# Patient Record
Sex: Female | Born: 1935
Health system: Southern US, Community
[De-identification: ages and names within clinical notes are randomized; demographics above are authoritative.]

## PROBLEM LIST (undated history)

## (undated) DIAGNOSIS — R0602 Shortness of breath: Secondary | ICD-10-CM

## (undated) DIAGNOSIS — I714 Abdominal aortic aneurysm, without rupture, unspecified: Secondary | ICD-10-CM

## (undated) DIAGNOSIS — J439 Emphysema, unspecified: Secondary | ICD-10-CM

## (undated) DIAGNOSIS — M779 Enthesopathy, unspecified: Secondary | ICD-10-CM

## (undated) DIAGNOSIS — D649 Anemia, unspecified: Secondary | ICD-10-CM

## (undated) DIAGNOSIS — E785 Hyperlipidemia, unspecified: Secondary | ICD-10-CM

## (undated) DIAGNOSIS — C20 Malignant neoplasm of rectum: Secondary | ICD-10-CM

## (undated) DIAGNOSIS — F0150 Vascular dementia without behavioral disturbance: Secondary | ICD-10-CM

## (undated) DIAGNOSIS — K529 Noninfective gastroenteritis and colitis, unspecified: Secondary | ICD-10-CM

## (undated) DIAGNOSIS — E039 Hypothyroidism, unspecified: Secondary | ICD-10-CM

## (undated) DIAGNOSIS — I10 Essential (primary) hypertension: Secondary | ICD-10-CM

## (undated) DIAGNOSIS — M1990 Unspecified osteoarthritis, unspecified site: Secondary | ICD-10-CM

## (undated) DIAGNOSIS — Z9289 Personal history of other medical treatment: Secondary | ICD-10-CM

## (undated) DIAGNOSIS — K435 Parastomal hernia without obstruction or  gangrene: Secondary | ICD-10-CM

## (undated) DIAGNOSIS — M419 Scoliosis, unspecified: Secondary | ICD-10-CM

## (undated) DIAGNOSIS — J449 Chronic obstructive pulmonary disease, unspecified: Secondary | ICD-10-CM

## (undated) DIAGNOSIS — B029 Zoster without complications: Secondary | ICD-10-CM

## (undated) DIAGNOSIS — R51 Headache: Secondary | ICD-10-CM

## (undated) DIAGNOSIS — J40 Bronchitis, not specified as acute or chronic: Secondary | ICD-10-CM

## (undated) DIAGNOSIS — I2510 Atherosclerotic heart disease of native coronary artery without angina pectoris: Secondary | ICD-10-CM

## (undated) DIAGNOSIS — K219 Gastro-esophageal reflux disease without esophagitis: Secondary | ICD-10-CM

## (undated) DIAGNOSIS — E46 Unspecified protein-calorie malnutrition: Secondary | ICD-10-CM

## (undated) DIAGNOSIS — Z87442 Personal history of urinary calculi: Secondary | ICD-10-CM

## (undated) HISTORY — DX: Hypothyroidism, unspecified: E03.9

## (undated) HISTORY — PX: COLOSTOMY: SHX63

## (undated) HISTORY — DX: Chronic obstructive pulmonary disease, unspecified: J44.9

## (undated) HISTORY — PX: VAGINAL HYSTERECTOMY: SUR661

## (undated) HISTORY — DX: Unspecified osteoarthritis, unspecified site: M19.90

## (undated) HISTORY — PX: CARDIAC CATHETERIZATION: SHX172

## (undated) HISTORY — DX: Enthesopathy, unspecified: M77.9

## (undated) HISTORY — DX: Malignant neoplasm of rectum: C20

## (undated) HISTORY — DX: Atherosclerotic heart disease of native coronary artery without angina pectoris: I25.10

## (undated) HISTORY — DX: Emphysema, unspecified: J43.9

## (undated) HISTORY — PX: APPENDECTOMY: SHX54

## (undated) HISTORY — PX: COLON SURGERY: SHX602

## (undated) HISTORY — DX: Scoliosis, unspecified: M41.9

## (undated) HISTORY — PX: OTHER SURGICAL HISTORY: SHX169

## (undated) HISTORY — PX: POLYPECTOMY: SHX149

## (undated) HISTORY — DX: Headache: R51

## (undated) HISTORY — PX: COLONOSCOPY: SHX174

## (undated) HISTORY — DX: Anemia, unspecified: D64.9

## (undated) HISTORY — PX: CHOLECYSTECTOMY: SHX55

## (undated) HISTORY — PX: EYE SURGERY: SHX253

## (undated) HISTORY — DX: Essential (primary) hypertension: I10

## (undated) HISTORY — PX: OOPHORECTOMY: SHX86

## (undated) HISTORY — DX: Bronchitis, not specified as acute or chronic: J40

## (undated) HISTORY — PX: TUBAL LIGATION: SHX77

## (undated) HISTORY — DX: Zoster without complications: B02.9

## (undated) HISTORY — DX: Hyperlipidemia, unspecified: E78.5

---

## 2009-08-16 ENCOUNTER — Encounter: Payer: Self-pay | Admitting: Gastroenterology

## 2009-08-16 ENCOUNTER — Encounter: Payer: Self-pay | Admitting: Internal Medicine

## 2009-08-20 ENCOUNTER — Encounter: Payer: Self-pay | Admitting: Gastroenterology

## 2009-08-25 ENCOUNTER — Encounter: Payer: Self-pay | Admitting: Internal Medicine

## 2009-09-01 ENCOUNTER — Ambulatory Visit: Payer: Self-pay | Admitting: Internal Medicine

## 2009-09-01 DIAGNOSIS — D509 Iron deficiency anemia, unspecified: Secondary | ICD-10-CM

## 2009-09-01 DIAGNOSIS — M810 Age-related osteoporosis without current pathological fracture: Secondary | ICD-10-CM | POA: Insufficient documentation

## 2009-09-01 DIAGNOSIS — I10 Essential (primary) hypertension: Secondary | ICD-10-CM

## 2009-09-01 DIAGNOSIS — C20 Malignant neoplasm of rectum: Secondary | ICD-10-CM

## 2009-09-01 DIAGNOSIS — R51 Headache: Secondary | ICD-10-CM

## 2009-09-01 DIAGNOSIS — E785 Hyperlipidemia, unspecified: Secondary | ICD-10-CM

## 2009-09-01 DIAGNOSIS — J449 Chronic obstructive pulmonary disease, unspecified: Secondary | ICD-10-CM

## 2009-09-01 DIAGNOSIS — R519 Headache, unspecified: Secondary | ICD-10-CM | POA: Insufficient documentation

## 2009-09-01 DIAGNOSIS — J4489 Other specified chronic obstructive pulmonary disease: Secondary | ICD-10-CM | POA: Insufficient documentation

## 2009-09-01 DIAGNOSIS — E039 Hypothyroidism, unspecified: Secondary | ICD-10-CM | POA: Insufficient documentation

## 2009-09-01 LAB — CONVERTED CEMR LAB
Basophils Absolute: 0 10*3/uL (ref 0.0–0.1)
Hemoglobin: 11.5 g/dL — ABNORMAL LOW (ref 12.0–15.0)
Lymphocytes Relative: 12.6 % (ref 12.0–46.0)
Monocytes Relative: 10.8 % (ref 3.0–12.0)
Neutro Abs: 6.5 10*3/uL (ref 1.4–7.7)
RBC: 4.35 M/uL (ref 3.87–5.11)
RDW: 16.9 % — ABNORMAL HIGH (ref 11.5–14.6)
WBC: 9.3 10*3/uL (ref 4.5–10.5)

## 2009-09-02 ENCOUNTER — Ambulatory Visit: Payer: Self-pay | Admitting: Oncology

## 2009-09-04 ENCOUNTER — Telehealth: Payer: Self-pay | Admitting: Family Medicine

## 2009-09-08 ENCOUNTER — Encounter: Payer: Self-pay | Admitting: Internal Medicine

## 2009-09-09 ENCOUNTER — Encounter: Payer: Self-pay | Admitting: Gastroenterology

## 2009-09-09 ENCOUNTER — Telehealth (INDEPENDENT_AMBULATORY_CARE_PROVIDER_SITE_OTHER): Payer: Self-pay | Admitting: *Deleted

## 2009-09-09 ENCOUNTER — Ambulatory Visit: Admission: RE | Admit: 2009-09-09 | Discharge: 2009-11-06 | Payer: Self-pay | Admitting: Radiation Oncology

## 2009-09-09 LAB — CBC WITH DIFFERENTIAL/PLATELET
BASO%: 0.6 % (ref 0.0–2.0)
EOS%: 5.7 % (ref 0.0–7.0)
HCT: 33.3 % — ABNORMAL LOW (ref 34.8–46.6)
LYMPH%: 8.1 % — ABNORMAL LOW (ref 14.0–49.7)
MCH: 26.8 pg (ref 25.1–34.0)
MCHC: 33.3 g/dL (ref 31.5–36.0)
MONO#: 0.9 10*3/uL (ref 0.1–0.9)
NEUT%: 76.2 % (ref 38.4–76.8)
Platelets: 408 10*3/uL — ABNORMAL HIGH (ref 145–400)

## 2009-09-09 LAB — COMPREHENSIVE METABOLIC PANEL
Alkaline Phosphatase: 98 U/L (ref 39–117)
CO2: 25 mEq/L (ref 19–32)
Creatinine, Ser: 0.79 mg/dL (ref 0.40–1.20)
Glucose, Bld: 114 mg/dL — ABNORMAL HIGH (ref 70–99)
Sodium: 138 mEq/L (ref 135–145)
Total Bilirubin: 0.4 mg/dL (ref 0.3–1.2)
Total Protein: 6.1 g/dL (ref 6.0–8.3)

## 2009-09-09 LAB — CEA: CEA: 1.8 ng/mL (ref 0.0–5.0)

## 2009-09-12 ENCOUNTER — Ambulatory Visit: Payer: Self-pay | Admitting: Gastroenterology

## 2009-09-12 ENCOUNTER — Ambulatory Visit (HOSPITAL_COMMUNITY): Admission: RE | Admit: 2009-09-12 | Discharge: 2009-09-12 | Payer: Self-pay | Admitting: Gastroenterology

## 2009-09-16 ENCOUNTER — Ambulatory Visit: Payer: Self-pay | Admitting: Internal Medicine

## 2009-09-16 DIAGNOSIS — R112 Nausea with vomiting, unspecified: Secondary | ICD-10-CM | POA: Insufficient documentation

## 2009-09-16 LAB — CONVERTED CEMR LAB
Albumin: 2.7 g/dL — ABNORMAL LOW (ref 3.5–5.2)
BUN: 8 mg/dL (ref 6–23)
CO2: 30 meq/L (ref 19–32)
Calcium: 9.6 mg/dL (ref 8.4–10.5)
Creatinine, Ser: 0.8 mg/dL (ref 0.4–1.2)
Eosinophils Relative: 2.7 % (ref 0.0–5.0)
Glucose, Bld: 128 mg/dL — ABNORMAL HIGH (ref 70–99)
HCT: 33.8 % — ABNORMAL LOW (ref 36.0–46.0)
Hemoglobin: 11.1 g/dL — ABNORMAL LOW (ref 12.0–15.0)
Lymphs Abs: 1.1 10*3/uL (ref 0.7–4.0)
Monocytes Relative: 9.4 % (ref 3.0–12.0)
Neutro Abs: 8.7 10*3/uL — ABNORMAL HIGH (ref 1.4–7.7)
RDW: 18.2 % — ABNORMAL HIGH (ref 11.5–14.6)
Total Protein: 6 g/dL (ref 6.0–8.3)
WBC: 11.2 10*3/uL — ABNORMAL HIGH (ref 4.5–10.5)

## 2009-09-21 ENCOUNTER — Telehealth (INDEPENDENT_AMBULATORY_CARE_PROVIDER_SITE_OTHER): Payer: Self-pay | Admitting: *Deleted

## 2009-09-23 ENCOUNTER — Ambulatory Visit: Payer: Self-pay | Admitting: Cardiovascular Disease

## 2009-09-23 DIAGNOSIS — R002 Palpitations: Secondary | ICD-10-CM | POA: Insufficient documentation

## 2009-09-28 ENCOUNTER — Encounter (INDEPENDENT_AMBULATORY_CARE_PROVIDER_SITE_OTHER): Payer: Self-pay | Admitting: *Deleted

## 2009-09-28 LAB — CBC WITH DIFFERENTIAL/PLATELET
BASO%: 0.1 % (ref 0.0–2.0)
EOS%: 0.8 % (ref 0.0–7.0)
HCT: 29.6 % — ABNORMAL LOW (ref 34.8–46.6)
LYMPH%: 4.2 % — ABNORMAL LOW (ref 14.0–49.7)
MCH: 27.2 pg (ref 25.1–34.0)
MCHC: 33.8 g/dL (ref 31.5–36.0)
NEUT%: 88.3 % — ABNORMAL HIGH (ref 38.4–76.8)
Platelets: 457 10*3/uL — ABNORMAL HIGH (ref 145–400)
lymph#: 0.4 10*3/uL — ABNORMAL LOW (ref 0.9–3.3)

## 2009-09-28 LAB — COMPREHENSIVE METABOLIC PANEL
ALT: 15 U/L (ref 0–35)
AST: 12 U/L (ref 0–37)
Alkaline Phosphatase: 93 U/L (ref 39–117)
Creatinine, Ser: 0.77 mg/dL (ref 0.40–1.20)
Total Bilirubin: 0.4 mg/dL (ref 0.3–1.2)

## 2009-10-06 ENCOUNTER — Ambulatory Visit: Payer: Self-pay | Admitting: Surgery

## 2009-10-06 ENCOUNTER — Ambulatory Visit: Admission: RE | Admit: 2009-10-06 | Discharge: 2009-10-06 | Payer: Self-pay | Admitting: Radiation Oncology

## 2009-10-06 ENCOUNTER — Encounter: Payer: Self-pay | Admitting: Radiation Oncology

## 2009-10-19 ENCOUNTER — Ambulatory Visit: Payer: Self-pay | Admitting: Oncology

## 2009-10-21 ENCOUNTER — Encounter: Payer: Self-pay | Admitting: Internal Medicine

## 2009-10-21 LAB — CBC WITH DIFFERENTIAL/PLATELET
BASO%: 0.3 % (ref 0.0–2.0)
LYMPH%: 4.7 % — ABNORMAL LOW (ref 14.0–49.7)
MCHC: 33.8 g/dL (ref 31.5–36.0)
MONO#: 0.5 10*3/uL (ref 0.1–0.9)
Platelets: 261 10*3/uL (ref 145–400)
RBC: 3.18 10*6/uL — ABNORMAL LOW (ref 3.70–5.45)
WBC: 3.9 10*3/uL (ref 3.9–10.3)
lymph#: 0.2 10*3/uL — ABNORMAL LOW (ref 0.9–3.3)

## 2009-10-21 LAB — COMPREHENSIVE METABOLIC PANEL
ALT: 10 U/L (ref 0–35)
AST: 10 U/L (ref 0–37)
Alkaline Phosphatase: 69 U/L (ref 39–117)
CO2: 25 mEq/L (ref 19–32)
Sodium: 136 mEq/L (ref 135–145)
Total Bilirubin: 0.5 mg/dL (ref 0.3–1.2)
Total Protein: 5.4 g/dL — ABNORMAL LOW (ref 6.0–8.3)

## 2009-10-21 LAB — CEA: CEA: 1.8 ng/mL (ref 0.0–5.0)

## 2009-11-03 ENCOUNTER — Encounter (HOSPITAL_COMMUNITY): Admission: RE | Admit: 2009-11-03 | Discharge: 2009-12-16 | Payer: Self-pay | Admitting: Oncology

## 2009-11-03 ENCOUNTER — Encounter (INDEPENDENT_AMBULATORY_CARE_PROVIDER_SITE_OTHER): Payer: Self-pay | Admitting: *Deleted

## 2009-11-03 LAB — CBC WITH DIFFERENTIAL/PLATELET
BASO%: 0.3 % (ref 0.0–2.0)
EOS%: 5.6 % (ref 0.0–7.0)
HCT: 26.2 % — ABNORMAL LOW (ref 34.8–46.6)
LYMPH%: 7.6 % — ABNORMAL LOW (ref 14.0–49.7)
MCH: 29.9 pg (ref 25.1–34.0)
MCHC: 33.3 g/dL (ref 31.5–36.0)
MCV: 89.8 fL (ref 79.5–101.0)
MONO%: 15.3 % — ABNORMAL HIGH (ref 0.0–14.0)
NEUT%: 71.2 % (ref 38.4–76.8)
Platelets: 272 10*3/uL (ref 145–400)

## 2009-11-03 LAB — COMPREHENSIVE METABOLIC PANEL
ALT: 11 U/L (ref 0–35)
AST: 13 U/L (ref 0–37)
BUN: 9 mg/dL (ref 6–23)
Creatinine, Ser: 0.88 mg/dL (ref 0.40–1.20)
Total Bilirubin: 0.7 mg/dL (ref 0.3–1.2)

## 2009-11-03 LAB — IRON AND TIBC
Iron: 47 ug/dL (ref 42–145)
UIBC: 252 ug/dL

## 2009-11-04 LAB — TYPE & CROSSMATCH - CHCC

## 2009-11-17 ENCOUNTER — Encounter: Payer: Self-pay | Admitting: Gastroenterology

## 2009-11-17 ENCOUNTER — Ambulatory Visit: Payer: Self-pay | Admitting: Oncology

## 2009-11-17 ENCOUNTER — Encounter (INDEPENDENT_AMBULATORY_CARE_PROVIDER_SITE_OTHER): Payer: Self-pay | Admitting: *Deleted

## 2009-11-21 ENCOUNTER — Encounter: Payer: Self-pay | Admitting: Internal Medicine

## 2009-11-21 LAB — IRON AND TIBC
%SAT: 28 % (ref 20–55)
TIBC: 305 ug/dL (ref 250–470)

## 2009-11-21 LAB — COMPREHENSIVE METABOLIC PANEL
ALT: 8 U/L (ref 0–35)
AST: 10 U/L (ref 0–37)
CO2: 26 mEq/L (ref 19–32)
Calcium: 9.7 mg/dL (ref 8.4–10.5)
Chloride: 104 mEq/L (ref 96–112)
Creatinine, Ser: 0.71 mg/dL (ref 0.40–1.20)
Sodium: 140 mEq/L (ref 135–145)
Total Protein: 6.1 g/dL (ref 6.0–8.3)

## 2009-11-21 LAB — CBC WITH DIFFERENTIAL/PLATELET
BASO%: 0.6 % (ref 0.0–2.0)
EOS%: 2.9 % (ref 0.0–7.0)
HCT: 34.5 % — ABNORMAL LOW (ref 34.8–46.6)
MCH: 31.1 pg (ref 25.1–34.0)
MCHC: 34.2 g/dL (ref 31.5–36.0)
MONO#: 0.5 10*3/uL (ref 0.1–0.9)
NEUT%: 71 % (ref 38.4–76.8)
RBC: 3.79 10*6/uL (ref 3.70–5.45)
RDW: 21.8 % — ABNORMAL HIGH (ref 11.2–14.5)
WBC: 4.1 10*3/uL (ref 3.9–10.3)
lymph#: 0.6 10*3/uL — ABNORMAL LOW (ref 0.9–3.3)

## 2009-12-06 LAB — URINALYSIS, MICROSCOPIC - CHCC
Bilirubin (Urine): NEGATIVE
Ketones: NEGATIVE mg/dL
Specific Gravity, Urine: 1.03 (ref 1.003–1.035)
pH: 6 (ref 4.6–8.0)

## 2009-12-12 ENCOUNTER — Telehealth: Payer: Self-pay | Admitting: Internal Medicine

## 2009-12-21 ENCOUNTER — Ambulatory Visit: Payer: Self-pay | Admitting: Internal Medicine

## 2009-12-22 ENCOUNTER — Telehealth (INDEPENDENT_AMBULATORY_CARE_PROVIDER_SITE_OTHER): Payer: Self-pay | Admitting: *Deleted

## 2009-12-26 ENCOUNTER — Inpatient Hospital Stay (HOSPITAL_COMMUNITY): Admission: RE | Admit: 2009-12-26 | Discharge: 2010-01-09 | Payer: Self-pay | Admitting: Surgery

## 2009-12-27 ENCOUNTER — Encounter (INDEPENDENT_AMBULATORY_CARE_PROVIDER_SITE_OTHER): Payer: Self-pay | Admitting: Surgery

## 2010-01-09 ENCOUNTER — Ambulatory Visit: Payer: Self-pay | Admitting: Oncology

## 2010-01-12 ENCOUNTER — Encounter: Payer: Self-pay | Admitting: Internal Medicine

## 2010-01-19 ENCOUNTER — Encounter: Payer: Self-pay | Admitting: Gastroenterology

## 2010-01-19 ENCOUNTER — Encounter: Admission: RE | Admit: 2010-01-19 | Discharge: 2010-01-19 | Payer: Self-pay | Admitting: Surgery

## 2010-01-26 ENCOUNTER — Encounter: Payer: Self-pay | Admitting: Internal Medicine

## 2010-01-26 LAB — CBC WITH DIFFERENTIAL/PLATELET
BASO%: 0.3 % (ref 0.0–2.0)
Basophils Absolute: 0 10*3/uL (ref 0.0–0.1)
EOS%: 1.5 % (ref 0.0–7.0)
HCT: 33.8 % — ABNORMAL LOW (ref 34.8–46.6)
HGB: 11.5 g/dL — ABNORMAL LOW (ref 11.6–15.9)
LYMPH%: 7.9 % — ABNORMAL LOW (ref 14.0–49.7)
MCH: 30.7 pg (ref 25.1–34.0)
MCHC: 34.1 g/dL (ref 31.5–36.0)
MCV: 90.2 fL (ref 79.5–101.0)
NEUT%: 80.1 % — ABNORMAL HIGH (ref 38.4–76.8)
Platelets: 304 10*3/uL (ref 145–400)

## 2010-01-26 LAB — COMPREHENSIVE METABOLIC PANEL
ALT: <8 U/L (ref 0–35)
AST: 9 U/L (ref 0–37)
BUN: 18 mg/dL (ref 6–23)
Calcium: 10.4 mg/dL (ref 8.4–10.5)
Creatinine, Ser: 0.8 mg/dL (ref 0.40–1.20)
Total Bilirubin: 0.3 mg/dL (ref 0.3–1.2)

## 2010-01-26 LAB — TSH: TSH: 4.945 u[IU]/mL — ABNORMAL HIGH (ref 0.350–4.500)

## 2010-02-06 ENCOUNTER — Telehealth (INDEPENDENT_AMBULATORY_CARE_PROVIDER_SITE_OTHER): Payer: Self-pay | Admitting: *Deleted

## 2010-02-08 ENCOUNTER — Ambulatory Visit (HOSPITAL_BASED_OUTPATIENT_CLINIC_OR_DEPARTMENT_OTHER): Admission: RE | Admit: 2010-02-08 | Discharge: 2010-02-08 | Payer: Self-pay | Admitting: Surgery

## 2010-02-17 ENCOUNTER — Ambulatory Visit: Payer: Self-pay | Admitting: Oncology

## 2010-02-21 ENCOUNTER — Encounter: Payer: Self-pay | Admitting: Internal Medicine

## 2010-02-21 ENCOUNTER — Telehealth: Payer: Self-pay | Admitting: Internal Medicine

## 2010-02-21 LAB — COMPREHENSIVE METABOLIC PANEL
ALT: 13 U/L (ref 0–35)
AST: 15 U/L (ref 0–37)
Albumin: 3.7 g/dL (ref 3.5–5.2)
CO2: 26 mEq/L (ref 19–32)
Calcium: 9.8 mg/dL (ref 8.4–10.5)
Chloride: 106 mEq/L (ref 96–112)
Potassium: 3.8 mEq/L (ref 3.5–5.3)
Sodium: 135 mEq/L (ref 135–145)
Total Protein: 6.7 g/dL (ref 6.0–8.3)

## 2010-02-21 LAB — CBC WITH DIFFERENTIAL/PLATELET
BASO%: 0.3 % (ref 0.0–2.0)
Eosinophils Absolute: 0.1 10*3/uL (ref 0.0–0.5)
HCT: 36.5 % (ref 34.8–46.6)
LYMPH%: 10.6 % — ABNORMAL LOW (ref 14.0–49.7)
MCHC: 32.1 g/dL (ref 31.5–36.0)
MONO#: 0.7 10*3/uL (ref 0.1–0.9)
NEUT#: 4.7 10*3/uL (ref 1.5–6.5)
Platelets: 216 10*3/uL (ref 145–400)
RBC: 4.05 10*6/uL (ref 3.70–5.45)
WBC: 6.1 10*3/uL (ref 3.9–10.3)
lymph#: 0.7 10*3/uL — ABNORMAL LOW (ref 0.9–3.3)
nRBC: 0 % (ref 0–0)

## 2010-03-07 ENCOUNTER — Encounter: Payer: Self-pay | Admitting: Internal Medicine

## 2010-03-07 LAB — CBC WITH DIFFERENTIAL/PLATELET
HGB: 11.6 g/dL (ref 11.6–15.9)
LYMPH%: 9.2 % — ABNORMAL LOW (ref 14.0–49.7)
MCH: 29 pg (ref 25.1–34.0)
MCHC: 32.2 g/dL (ref 31.5–36.0)
MONO#: 1 10*3/uL — ABNORMAL HIGH (ref 0.1–0.9)
MONO%: 17.5 % — ABNORMAL HIGH (ref 0.0–14.0)
NEUT%: 69.8 % (ref 38.4–76.8)
RDW: 15.4 % — ABNORMAL HIGH (ref 11.2–14.5)
WBC: 5.4 10*3/uL (ref 3.9–10.3)
lymph#: 0.5 10*3/uL — ABNORMAL LOW (ref 0.9–3.3)
nRBC: 0 % (ref 0–0)

## 2010-03-07 LAB — COMPREHENSIVE METABOLIC PANEL
ALT: 12 U/L (ref 0–35)
AST: 15 U/L (ref 0–37)
Alkaline Phosphatase: 87 U/L (ref 39–117)
CO2: 25 mEq/L (ref 19–32)
Creatinine, Ser: 0.89 mg/dL (ref 0.40–1.20)
Sodium: 138 mEq/L (ref 135–145)
Total Bilirubin: 0.4 mg/dL (ref 0.3–1.2)
Total Protein: 5.7 g/dL — ABNORMAL LOW (ref 6.0–8.3)

## 2010-03-22 ENCOUNTER — Ambulatory Visit: Payer: Self-pay | Admitting: Family Medicine

## 2010-03-22 LAB — CONVERTED CEMR LAB
ALT: 17 units/L (ref 0–35)
AST: 24 units/L (ref 0–37)
Albumin: 3.4 g/dL — ABNORMAL LOW (ref 3.5–5.2)
Amylase: 37 units/L (ref 27–131)
Chloride: 103 meq/L (ref 96–112)
HCT: 35.4 % — ABNORMAL LOW (ref 36.0–46.0)
MCV: 88.5 fL (ref 78.0–100.0)
Potassium: 4.6 meq/L (ref 3.5–5.1)
RBC: 3.99 M/uL (ref 3.87–5.11)
Total Bilirubin: 0.8 mg/dL (ref 0.3–1.2)
WBC: 2.5 10*3/uL — ABNORMAL LOW (ref 4.5–10.5)

## 2010-03-23 ENCOUNTER — Inpatient Hospital Stay (HOSPITAL_COMMUNITY): Admission: EM | Admit: 2010-03-23 | Discharge: 2010-03-26 | Payer: Self-pay | Admitting: Emergency Medicine

## 2010-03-31 ENCOUNTER — Ambulatory Visit: Payer: Self-pay | Admitting: Oncology

## 2010-04-01 ENCOUNTER — Inpatient Hospital Stay (HOSPITAL_COMMUNITY): Admission: EM | Admit: 2010-04-01 | Discharge: 2010-04-12 | Payer: Self-pay | Admitting: Emergency Medicine

## 2010-04-21 ENCOUNTER — Telehealth: Payer: Self-pay

## 2010-04-26 ENCOUNTER — Encounter: Payer: Self-pay | Admitting: Internal Medicine

## 2010-05-23 ENCOUNTER — Ambulatory Visit: Payer: Self-pay | Admitting: Oncology

## 2010-05-25 ENCOUNTER — Encounter: Payer: Self-pay | Admitting: Internal Medicine

## 2010-05-25 LAB — CBC WITH DIFFERENTIAL/PLATELET
Basophils Absolute: 0 10*3/uL (ref 0.0–0.1)
Eosinophils Absolute: 0.1 10*3/uL (ref 0.0–0.5)
HCT: 31.2 % — ABNORMAL LOW (ref 34.8–46.6)
HGB: 10.7 g/dL — ABNORMAL LOW (ref 11.6–15.9)
LYMPH%: 10.1 % — ABNORMAL LOW (ref 14.0–49.7)
MCV: 86.9 fL (ref 79.5–101.0)
MONO#: 0.5 10*3/uL (ref 0.1–0.9)
MONO%: 12 % (ref 0.0–14.0)
NEUT#: 3.3 10*3/uL (ref 1.5–6.5)
Platelets: 238 10*3/uL (ref 145–400)
WBC: 4.4 10*3/uL (ref 3.9–10.3)

## 2010-05-25 LAB — COMPREHENSIVE METABOLIC PANEL
Albumin: 3.6 g/dL (ref 3.5–5.2)
Alkaline Phosphatase: 103 U/L (ref 39–117)
BUN: 17 mg/dL (ref 6–23)
CO2: 28 mEq/L (ref 19–32)
Glucose, Bld: 118 mg/dL — ABNORMAL HIGH (ref 70–99)
Total Bilirubin: 0.5 mg/dL (ref 0.3–1.2)
Total Protein: 6.3 g/dL (ref 6.0–8.3)

## 2010-05-25 LAB — CEA: CEA: 1.5 ng/mL (ref 0.0–5.0)

## 2010-06-07 ENCOUNTER — Ambulatory Visit (HOSPITAL_BASED_OUTPATIENT_CLINIC_OR_DEPARTMENT_OTHER): Admission: RE | Admit: 2010-06-07 | Discharge: 2010-06-07 | Payer: Self-pay | Admitting: Surgery

## 2010-06-13 ENCOUNTER — Telehealth: Payer: Self-pay | Admitting: Internal Medicine

## 2010-08-15 ENCOUNTER — Ambulatory Visit: Payer: Self-pay | Admitting: Internal Medicine

## 2010-08-15 DIAGNOSIS — R209 Unspecified disturbances of skin sensation: Secondary | ICD-10-CM

## 2010-08-15 LAB — CONVERTED CEMR LAB
AST: 14 units/L (ref 0–37)
TSH: 1.3 microintl units/mL (ref 0.35–5.50)
Total CHOL/HDL Ratio: 4
VLDL: 31.8 mg/dL (ref 0.0–40.0)

## 2010-08-28 ENCOUNTER — Ambulatory Visit: Payer: Self-pay | Admitting: Oncology

## 2010-08-30 ENCOUNTER — Encounter: Payer: Self-pay | Admitting: Internal Medicine

## 2010-09-28 ENCOUNTER — Ambulatory Visit: Payer: Self-pay | Admitting: Internal Medicine

## 2010-09-28 DIAGNOSIS — J069 Acute upper respiratory infection, unspecified: Secondary | ICD-10-CM

## 2010-11-14 ENCOUNTER — Ambulatory Visit: Payer: Self-pay | Admitting: Internal Medicine

## 2010-11-23 ENCOUNTER — Ambulatory Visit: Payer: Self-pay | Admitting: Oncology

## 2010-11-27 ENCOUNTER — Ambulatory Visit (HOSPITAL_COMMUNITY)
Admission: RE | Admit: 2010-11-27 | Discharge: 2010-11-27 | Payer: Self-pay | Source: Home / Self Care | Attending: Oncology | Admitting: Oncology

## 2010-11-27 LAB — COMPREHENSIVE METABOLIC PANEL
AST: 15 U/L (ref 0–37)
Albumin: 3.9 g/dL (ref 3.5–5.2)
BUN: 13 mg/dL (ref 6–23)
Calcium: 10.3 mg/dL (ref 8.4–10.5)
Chloride: 105 mEq/L (ref 96–112)
Creatinine, Ser: 0.76 mg/dL (ref 0.40–1.20)
Glucose, Bld: 87 mg/dL (ref 70–99)
Potassium: 4.6 mEq/L (ref 3.5–5.3)

## 2010-11-27 LAB — CBC WITH DIFFERENTIAL/PLATELET
Basophils Absolute: 0 10*3/uL (ref 0.0–0.1)
EOS%: 3 % (ref 0.0–7.0)
Eosinophils Absolute: 0.1 10*3/uL (ref 0.0–0.5)
HCT: 35.6 % (ref 34.8–46.6)
HGB: 12.1 g/dL (ref 11.6–15.9)
MCH: 29.5 pg (ref 25.1–34.0)
MCV: 86.7 fL (ref 79.5–101.0)
NEUT#: 2.9 10*3/uL (ref 1.5–6.5)
NEUT%: 73.4 % (ref 38.4–76.8)
RDW: 15.3 % — ABNORMAL HIGH (ref 11.2–14.5)
lymph#: 0.5 10*3/uL — ABNORMAL LOW (ref 0.9–3.3)

## 2010-11-27 LAB — CEA: CEA: 1.5 ng/mL (ref 0.0–5.0)

## 2010-11-29 ENCOUNTER — Encounter: Payer: Self-pay | Admitting: Internal Medicine

## 2011-01-16 NOTE — Medication Information (Signed)
Summary: Coverage Approval for Ventolin [facility]  Coverage Approval for Ventolin HFA   Imported By: Maryln Gottron 05/03/2010 09:43:30  _____________________________________________________________________  External Attachment:    Type:   Image     Comment:   External Document

## 2011-01-16 NOTE — Letter (Signed)
[facility]   Imported By: Maryln Gottron 03/16/2010 15:01:58  _____________________________________________________________________  External Attachment:    Type:   Image     Comment:   External Document

## 2011-01-16 NOTE — Letter (Signed)
[facility]   Imported By: Maryln Gottron 06/16/2010 13:37:44  _____________________________________________________________________  External Attachment:    Type:   Image     Comment:   External Document

## 2011-01-16 NOTE — Progress Notes (Signed)
Summary: thyroid  Phone Note Call from Patient   Caller: Daughter Call For: Sherry Savers  MD Summary of Call: TSH:  4.945 Labs were drawn at  Dr. Alver Fisher office one month ago. Daughter:  [phone]  Ms. Friddle Any change of meds.  She is taking Synthroid . daily. Initial call taken by: Lynann Beaver CMA,  February 21, 2010 1:58 PM  Follow-up for Phone Call        nl results- no change meds Follow-up by: Sherry Savers  MD,  February 21, 2010 4:55 PM  Additional Follow-up for Phone Call Additional follow up Details #1::        Pt. notified. Additional Follow-up by: Lynann Beaver CMA,  February 21, 2010 5:03 PM

## 2011-01-16 NOTE — Letter (Signed)
[facility]   Imported By: Maryln Gottron 03/09/2010 15:19:31  _____________________________________________________________________  External Attachment:    Type:   Image     Comment:   External Document

## 2011-01-16 NOTE — Consult Note (Signed)
[facility]   Imported By: Lester  09/30/2009 12:26:55  _____________________________________________________________________  External Attachment:    Type:   Image     Comment:   External Document

## 2011-01-16 NOTE — Letter (Signed)
Summary: [facility]  [facility]   Imported By: Maryln Gottron 12/26/2009 13:05:54  _____________________________________________________________________  External Attachment:    Type:   Image     Comment:   External Document

## 2011-01-16 NOTE — Assessment & Plan Note (Signed)
Summary: 3 MONTH ROV/NJR   Vital Signs:  Patient profile:   77 year old female Weight:      137 pounds Temp:     98.0 degrees F oral BP sitting:   122 / 70  (right arm) Cuff size:   regular  Vitals Entered By: Duard Brady LPN (November 14, 2010 10:17 AM) CC: 3 mos rov - doing well Is Patient Diabetic? No   Primary Care Provider:  Dr.Peter Staci Righter  CC:  3 mos rov - doing well.  History of Present Illness: 77 year old patient who is seen today for follow-up.  This is followed closely by oncology.  She has treated hypertension, and dyslipidemia, as well as a history of COPD.  No concerns or complaints today.  She is scheduled for follow-up in the next month with abdominal CT scan and a colonoscopy in January  Allergies: 1)  ! Bactrim (Sulfamethoxazole-Trimethoprim) 2)  ! Fluvirin (Influenza Vac Typ A&b Surf Ant)  Past History:  Past Medical History: Reviewed history from 03/22/2010 and no changes required. coronary artery disease Hyperlipidemia Hypertension rectal adenocarcinoma, sees Dr. Eli Hose for Oncology and Dr. Wendall Papa for GI care Anemia-iron deficiency COPD Headache Hypothyroidism Osteoporosis history of shingles  Past Surgical History: Reviewed history from 08/15/2010 and no changes required. Appendectomy Cholecystectomy Hysterectomy colonoscopy on August 16, 2009 revealed a rectal mass extending from the dentate line up to 5 cm status post cardiac angiogram, 1997, and 2002  Cardiolite  stress test February 2007-ejection fraction 68% with rest and 73% with stress.  normal test   cath 2002:  EF 70%, 20% prox LAD, mild  disease  removal rectum and 8 " colon Jan 2011 via APR per Dr. Manus Rudd  Review of Systems  The patient denies anorexia, fever, weight loss, weight gain, vision loss, decreased hearing, hoarseness, chest pain, syncope, dyspnea on exertion, peripheral edema, prolonged cough, headaches, hemoptysis, abdominal pain,  melena, hematochezia, severe indigestion/heartburn, hematuria, incontinence, genital sores, muscle weakness, suspicious skin lesions, transient blindness, difficulty walking, depression, unusual weight change, abnormal bleeding, enlarged lymph nodes, angioedema, and breast masses.    Physical Exam  General:  Well-developed,well-nourished,in no acute distress; alert,appropriate and cooperative throughout examination Head:  Normocephalic and atraumatic without obvious abnormalities. No apparent alopecia or balding. Eyes:  No corneal or conjunctival inflammation noted. EOMI. Perrla. Funduscopic exam benign, without hemorrhages, exudates or papilledema. Vision grossly normal. Mouth:  Oral mucosa and oropharynx without lesions or exudates.  Teeth in good repair. Neck:  No deformities, masses, or tenderness noted. Lungs:  Normal respiratory effort, chest expands symmetrically. Lungs are clear to auscultation, no crackles or wheezes. Heart:  Normal rate and regular rhythm. S1 and S2 normal without gallop, murmur, click, rub or other extra sounds. Abdomen:  Bowel sounds positive,abdomen soft and non-tender without masses, organomegaly or hernias noted. Msk:  No deformity or scoliosis noted of thoracic or lumbar spine.     Impression & Recommendations:  Problem # 1:  HYPOTHYROIDISM (ICD-244.9)  Her updated medication list for this problem includes:    Synthroid 75 Mcg Tabs (Levothyroxine sodium) .Marland Kitchen... 1 once daily  Her updated medication list for this problem includes:    Synthroid 75 Mcg Tabs (Levothyroxine sodium) .Marland Kitchen... 1 once daily  Problem # 2:  HYPERTENSION (ICD-401.9)  Her updated medication list for this problem includes:    Diltiazem Hcl Er Beads 180 Mg Xr24h-cap (Diltiazem hcl er beads) .Marland Kitchen... 1 once daily    Toprol Xl 25 Mg Xr24h-tab (Metoprolol succinate) .Marland Kitchen... 1/2  once daily  Her updated medication list for this problem includes:    Diltiazem Hcl Er Beads 180 Mg Xr24h-cap (Diltiazem  hcl er beads) .Marland Kitchen... 1 once daily    Toprol Xl 25 Mg Xr24h-tab (Metoprolol succinate) .Marland Kitchen... 1/2 once daily  Complete Medication List: 1)  Diltiazem Hcl Er Beads 180 Mg Xr24h-cap (Diltiazem hcl er beads) .Marland Kitchen.. 1 once daily 2)  Lipitor 10 Mg Tabs (Atorvastatin calcium) .Marland Kitchen.. 1 once daily 3)  Toprol Xl 25 Mg Xr24h-tab (Metoprolol succinate) .... 1/2 once daily 4)  Synthroid 75 Mcg Tabs (Levothyroxine sodium) .Marland Kitchen.. 1 once daily 5)  Ventolin Hfa 108 (90 Base) Mcg/act Aers (Albuterol sulfate) .... Use qid as needed 6)  Ultracet 37.5-325 Mg Tabs (Tramadol-acetaminophen) .... As needed 7)  Cyclobenzaprine Hcl 5 Mg Tabs (Cyclobenzaprine hcl) .... One every  8 hours for back pain  Patient Instructions: 1)  Please schedule a follow-up appointment in 6 months. 2)  Limit your Sodium (Salt). 3)  It is important that you exercise regularly at least 20 minutes 5 times a week. If you develop chest pain, have severe difficulty breathing, or feel very tired , stop exercising immediately and seek medical attention. Prescriptions: CYCLOBENZAPRINE HCL 5 MG TABS (CYCLOBENZAPRINE HCL) one every  8 hours for back pain  #30 x 2   Entered and Authorized by:   Gordy Savers  MD   Signed by:   Gordy Savers  MD on 11/14/2010   Method used:   Electronically to        [facility]  [address] (retail)       [address]       [place], [place]  [address]       Ph: [phone]       Fax: [phone]   RxID:   [id number]  a and a  Orders Added: 1)  Est. Patient Level III [38101]

## 2011-01-16 NOTE — Letter (Signed)
Summary: [facility]   Imported By: Lester Meadow 02/08/2010 09:49:33  _____________________________________________________________________  External Attachment:    Type:   Image     Comment:   External Document

## 2011-01-16 NOTE — Progress Notes (Signed)
Summary: clarification ventolin HFA  Phone Note From Pharmacy   [facility] Summary of Call: please call - [phone]  ext [phone]  claification on ventolin HFA  Initial call taken by: Duard Brady LPN,  Apr 22, 5783 1:48 PM  Follow-up for Phone Call        spoke with pharmacy - 1 puff qid  Follow-up by: Duard Brady LPN,  Apr 21, 6961 2:04 PM

## 2011-01-16 NOTE — Progress Notes (Signed)
Summary: REQ FOR REFILLS  Phone Note Refill Request Message from:  Patient on June 13, 2010 8:43 AM  Refills Requested: Medication #1:  TOPROL XL 25 MG XR24H-TAB 1/2 once daily   Notes: Pt has these Rx's printed and then she mails them to [facility] to be filled.... Pt would like to have these prepared and left up front for her to p/u.... Pt  can be reached at [phone] when Rx's are ready.  Medication #2:  SYNTHROID 75 MCG TABS 1 once daily   Notes: Pt has these Rx's printed and then she mails them to [facility] to be filled.... Pt would like to have these prepared and left up front for her to p/u.... Pt  can be reached at [phone] when Rx's are ready.  Medication #3:  LIPITOR 10 MG TABS 1 once daily   Notes: Pt has these Rx's printed and then she mails them to [facility] to be filled.... Pt would like to have these prepared and left up front for her to p/u.... Pt  can be reached at [phone] when Rx's are ready.  Medication #4:  DILTIAZEM HCL ER BEADS 180 MG XR24H-CAP 1 once daily   Notes: Pt has these Rx's printed and then she mails them to [facility] to be filled.... Pt would like to have these prepared and left up front for her to p/u.... Pt  can be reached at [phone] when Rx's are ready.    Initial call taken by: Debbra Riding,  June 13, 2010 8:44 AM  Follow-up for Phone Call        refilled ,rx's ready for pic up - pt aware. KIK Follow-up by: Duard Brady LPN,  June 13, 2010 8:54 AM    Prescriptions: SYNTHROID 75 MCG TABS (LEVOTHYROXINE SODIUM) 1 once daily  #90 x 3   Entered by:   Duard Brady LPN   Authorized by:   Gordy Savers  MD   Signed by:   Duard Brady LPN on 25/42/7062   Method used:   Print then Give to Patient   RxID:   [id number] TOPROL XL 25 MG XR24H-TAB (METOPROLOL SUCCINATE) 1/2 once daily  #90 x 3   Entered by:   Duard Brady LPN   Authorized by:   Gordy Savers  MD   Signed by:   Duard Brady LPN on  71/05/2693   Method used:   Print then Give to Patient   RxID:   [id number] LIPITOR 10 MG TABS (ATORVASTATIN CALCIUM) 1 once daily  #90 x 3   Entered by:   Duard Brady LPN   Authorized by:   Gordy Savers  MD   Signed by:   Duard Brady LPN on 29/93/7169   Method used:   Print then Give to Patient   RxID:   [id number] DILTIAZEM HCL ER BEADS 180 MG XR24H-CAP (DILTIAZEM HCL ER BEADS) 1 once daily  #90 x 3   Entered by:   Duard Brady LPN   Authorized by:   Gordy Savers  MD   Signed by:   Duard Brady LPN on 52/77/8242   Method used:   Print then Give to Patient   RxID:   [id number]

## 2011-01-16 NOTE — Assessment & Plan Note (Signed)
Summary: stomach pain/vomitting/diarrhea/cjr   Vital Signs:  Patient profile:   77 year old female Weight:      125 pounds Temp:     98.2 degrees F oral Pulse rate:   64 / minute Pulse rhythm:   regular Resp:     12 per minute BP sitting:   116 / 62  (left arm)  Vitals Entered By: Gladis Riffle, RN (March 22, 2010 11:12 AM) CC: c/o abdominal cramping, nausea, vomiting, and diarrhea x 1 week--last chemo 03/07/10 Is Patient Diabetic? No Comments needs refill ventolin inhaler 90 day for [facility]   History of Present Illness: Here for one week of diffuse abdominal cramps, increased gas and bloating, and nausea. She has vomitted several times over the past few days, with the emesis appearing yellow. The stool in her colostomy bag is looser than normal but has not changed color. No fevers. She is drinking fluids but has kept no food down for 2 days. She has Zofran at home but has not used it for some reason. She was to have had a chemotherapy session yesterday, but she called to cancel it. She has a long hx of GERD but takes nothing for this.   Preventive Screening-Counseling & Management  Alcohol-Tobacco     Smoking Status: quit > 6 months     Year Started: 1939     Year Quit: 2006  Current Medications (verified): 1)  Diltiazem Hcl Er Beads 180 Mg Xr24h-Cap (Diltiazem Hcl Er Beads) .Marland Kitchen.. 1 Once Daily 2)  Lipitor 10 Mg Tabs (Atorvastatin Calcium) .Marland Kitchen.. 1 Once Daily 3)  Toprol Xl 25 Mg Xr24h-Tab (Metoprolol Succinate) .... 1/2 Once Daily 4)  Synthroid 75 Mcg Tabs (Levothyroxine Sodium) .Marland Kitchen.. 1 Once Daily 5)  Ventolin Hfa 108 (90 Base) Mcg/act Aers (Albuterol Sulfate) .... Use Qid As Needed  Allergies: 1)  ! Bactrim (Sulfamethoxazole-Trimethoprim) 2)  ! Fluvirin (Influenza Vac Typ A&b Surf Ant)  Past History:  Past Medical History: coronary artery disease Hyperlipidemia Hypertension rectal adenocarcinoma, sees Dr. Eli Hose for Oncology and Dr. Wendall Papa for GI care Anemia-iron  deficiency COPD Headache Hypothyroidism Osteoporosis history of shingles  Past Surgical History: Appendectomy Cholecystectomy Hysterectomy colonoscopy on August 16, 2009 revealed a rectal mass extending from the dentate line up to 5 cm status post cardiac angiogram, 1997, and 2002  Cardiolite  stress test February 2007-ejection fraction 68% with rest and 73% with stress.  normal test   cath 2002:  EF 70%, 20% prox LAD, mild  disease  removal rectum and 8 " colon Jan 2011 via APR per Dr. Manus Rudd  Social History: Smoking Status:  quit > 6 months  Review of Systems  The patient denies anorexia, fever, weight loss, weight gain, vision loss, decreased hearing, hoarseness, chest pain, syncope, dyspnea on exertion, peripheral edema, prolonged cough, headaches, hemoptysis, melena, hematochezia, severe indigestion/heartburn, hematuria, incontinence, genital sores, muscle weakness, suspicious skin lesions, transient blindness, difficulty walking, depression, unusual weight change, abnormal bleeding, enlarged lymph nodes, angioedema, breast masses, and testicular masses.    Physical Exam  General:  Well-developed,well-nourished,in no acute distress; alert,appropriate and cooperative throughout examination Lungs:  Normal respiratory effort, chest expands symmetrically. Lungs are clear to auscultation, no crackles or wheezes. Heart:  Normal rate and regular rhythm. S1 and S2 normal without gallop, murmur, click, rub or other extra sounds. Abdomen:  soft, normal bowel sounds, no distention, no masses, no guarding, no rigidity, no rebound tenderness, no abdominal hernia, no inguinal hernia, no hepatomegaly, and no splenomegaly.  She is mildly tender in the epigastrium and the LLQ. Her colostomy is intact.    Impression & Recommendations:  Problem # 1:  NAUSEA AND VOMITING (ICD-787.01)  Orders: Venipuncture (47829) TLB-BMP (Basic Metabolic Panel-BMET) (80048-METABOL) TLB-CBC Platelet -  w/Differential (85025-CBCD) TLB-Hepatic/Liver Function Pnl (80076-HEPATIC) TLB-Amylase (82150-AMYL) T-Abdomen 2-view (74020TC)  Problem # 2:  ADENOCARCINOMA, RECTUM (ICD-154.1)  Complete Medication List: 1)  Diltiazem Hcl Er Beads 180 Mg Xr24h-cap (Diltiazem hcl er beads) .Marland Kitchen.. 1 once daily 2)  Lipitor 10 Mg Tabs (Atorvastatin calcium) .Marland Kitchen.. 1 once daily 3)  Toprol Xl 25 Mg Xr24h-tab (Metoprolol succinate) .... 1/2 once daily 4)  Synthroid 75 Mcg Tabs (Levothyroxine sodium) .Marland Kitchen.. 1 once daily 5)  Ventolin Hfa 108 (90 Base) Mcg/act Aers (Albuterol sulfate) .... Use qid as needed 6)  Zofran 8 Mg Tabs (Ondansetron hcl) .... As needed 7)  Ultracet 37.5-325 Mg Tabs (Tramadol-acetaminophen) .... As needed 8)  Omeprazole 40 Mg Cpdr (Omeprazole) .... Once daily  Patient Instructions: 1)  She does not seem to be toxic or obstructed, but we will send her for plain abdominal films this afternoon. get labs today. This seems likely to be from duodenitis or an early ulcer, so we will start her on Omeprazole. Use Zofran as needed . Prescriptions: OMEPRAZOLE 40 MG CPDR (OMEPRAZOLE) once daily  #30 x 2   Entered and Authorized by:   Nelwyn Salisbury MD   Signed by:   Nelwyn Salisbury MD on 03/22/2010   Method used:   Electronically to        [facility]  [address] (retail)       [address]       [place], [place]  [address]       Ph: [phone]       Fax: [phone]   RxID:   [id number]

## 2011-01-16 NOTE — Assessment & Plan Note (Signed)
Summary: COLON SURG TUES/ANXIOUS/BURSITIS PAIN L SHOULDER ARM/SHINGLES...   Vital Signs:  Patient profile:   77 year old female Weight:      129 pounds Temp:     98.8 degrees F oral BP sitting:   108 / 70  (left arm) Cuff size:   regular  Vitals Entered By: Raechel Ache, RN (December 21, 2009 10:38 AM) CC: Having colon surgery on Tuesday. C/o L arm aching and check shingles R buttock. Is Patient Diabetic? No   Primary Care Provider:  Dr.Peter Staci Righter  CC:  Having colon surgery on Tuesday. C/o L arm aching and check shingles R buttock..  History of Present Illness: 77 year old patient who is seen today for follow-up.  She is scheduled for rectal surgery next week and has considered her preop chemotherapy and radiotherapy.  She has hypertension, hypothyroidism.  She is recovering from a URI.  Her main complaint today is some left shoulder pain.  She states she has had some similar pain in the past due to shoulder bursitis.  Pain is aggravated by movement of the shoulder.  Allergies: 1)  ! Bactrim (Sulfamethoxazole-Trimethoprim) 2)  ! Fluvirin (Influenza Vac Typ A&b Surf Ant)  Past History:  Past Medical History: Reviewed history from 09/01/2009 and no changes required. coronary artery disease Hyperlipidemia Hypertension rectal cancer Anemia-iron deficiency COPD Headache Hypothyroidism Osteoporosis history of shingles  Review of Systems       The patient complains of anorexia and dyspnea on exertion.  The patient denies fever, weight loss, weight gain, vision loss, decreased hearing, hoarseness, chest pain, syncope, peripheral edema, prolonged cough, headaches, hemoptysis, abdominal pain, melena, hematochezia, severe indigestion/heartburn, hematuria, incontinence, genital sores, muscle weakness, suspicious skin lesions, transient blindness, difficulty walking, depression, unusual weight change, abnormal bleeding, enlarged lymph nodes, angioedema, and breast masses.     Physical Exam  General:  Well-developed,well-nourished,in no acute distress; alert,appropriate and cooperative throughout examination Head:  Normocephalic and atraumatic without obvious abnormalities. No apparent alopecia or balding. Mouth:  Oral mucosa and oropharynx without lesions or exudates.  Teeth in good repair. Neck:  No deformities, masses, or tenderness noted. Lungs:  Normal respiratory effort, chest expands symmetrically. Lungs are clear to auscultation, no crackles or wheezes. Heart:  Normal rate and regular rhythm. S1 and S2 normal without gallop, murmur, click, rub or other extra sounds. Abdomen:  Bowel sounds positive,abdomen soft and non-tender without masses, organomegaly or hernias noted. Msk:  No deformity or scoliosis noted of thoracic or lumbar spine.     Impression & Recommendations:  Problem # 1:  HYPOTHYROIDISM (ICD-244.9)  Her updated medication list for this problem includes:    Synthroid 75 Mcg Tabs (Levothyroxine sodium) .Marland Kitchen... 1 once daily  Her updated medication list for this problem includes:    Synthroid 75 Mcg Tabs (Levothyroxine sodium) .Marland Kitchen... 1 once daily  Problem # 2:  COPD (ICD-496)  Her updated medication list for this problem includes:    Ventolin Hfa 108 (90 Base) Mcg/act Aers (Albuterol sulfate) ..... Use qid as needed  Her updated medication list for this problem includes:    Ventolin Hfa 108 (90 Base) Mcg/act Aers (Albuterol sulfate) ..... Use qid as needed  Complete Medication List: 1)  Diltiazem Hcl Er Beads 180 Mg Xr24h-cap (Diltiazem hcl er beads) .Marland Kitchen.. 1 once daily 2)  Lipitor 10 Mg Tabs (Atorvastatin calcium) .Marland Kitchen.. 1 once daily 3)  Toprol Xl 25 Mg Xr24h-tab (Metoprolol succinate) .... 1/2 once daily 4)  Synthroid 75 Mcg Tabs (Levothyroxine sodium) .Marland KitchenMarland KitchenMarland Kitchen 1  once daily 5)  Ventolin Hfa 108 (90 Base) Mcg/act Aers (Albuterol sulfate) .... Use qid as needed  Patient Instructions: 1)  Please schedule a follow-up appointment in 4  months. 2)  Limit your Sodium (Salt). 3)  It is important that you exercise regularly at least 20 minutes 5 times a week. If you develop chest pain, have severe difficulty breathing, or feel very tired , stop exercising immediately and seek medical attention.

## 2011-01-16 NOTE — Letter (Signed)
[facility]   Imported By: Maryln Gottron 02/09/2010 14:59:55  _____________________________________________________________________  External Attachment:    Type:   Image     Comment:   External Document

## 2011-01-16 NOTE — Assessment & Plan Note (Signed)
Summary: Consult re: hand and arm going to sleep/cjr   Vital Signs:  [name] profile:   77 year old female Weight:      130 pounds Temp:     98.2 degrees F oral BP sitting:   140 / 88  (right arm) Cuff size:   regular  Vitals Entered By: Duard Brady LPN (August 15, 2010 10:38 AM) CC: c/o arm numbness and shoulder pain Is [name] Diabetic? No   Primary Care Provider:  Dr.Peter Staci Righter  CC:  c/o arm numbness and shoulder pain.  History of Present Illness: Sherry [name] who is seen today for follow-up.  She has a history of COPD, hypothyroidism, and hypertension.  She is status post resection for rectal carcinoma earlier in the year.  Complaints today  include a one-week history of bilateral arm numbness, but also involves the hands.  The right side greater than left also describes some slight posterior neck discomfort and shoulder pain.  Denies any motor weakness. Her blood pressure has a well-controlled on Toprol.  She has a history of dyslipidemia, controlled on Lipitor 10 mg daily  Allergies: 1)  ! Bactrim (Sulfamethoxazole-Trimethoprim) 2)  ! Fluvirin (Influenza Vac Typ A&b Surf Ant)  Past History:  Past Medical History: Reviewed history from 03/22/2010 and no changes required. coronary artery disease Hyperlipidemia Hypertension rectal adenocarcinoma, sees Dr. Eli Hose for Oncology and Dr. Wendall Papa for GI care Anemia-iron deficiency COPD Headache Hypothyroidism Osteoporosis history of shingles  Past Surgical History: Appendectomy Cholecystectomy Hysterectomy colonoscopy on August 16, 2009 revealed a rectal mass extending from the dentate line up to 5 cm status post cardiac angiogram, 1997, and 2002  Cardiolite  stress test February 2007-ejection fraction 68% with rest and 73% with stress.  normal test   cath 2002:  EF 70%, 20% prox LAD, mild  disease  removal rectum and 8 " colon Jan 2011 via APR per Dr. Manus Rudd  Family  History: Reviewed history from 09/01/2009 and no changes required. father died age 15, MI mother died in a 26, history of head and neck cancer 4 brothers, one sister.  Positive cardiac disease.  One brother, history of colon cancer.  Positive cervical cancer  Review of Systems  The [name] denies anorexia, fever, weight loss, weight gain, vision loss, decreased hearing, hoarseness, chest pain, syncope, dyspnea on exertion, peripheral edema, prolonged cough, headaches, hemoptysis, abdominal pain, melena, hematochezia, severe indigestion/heartburn, hematuria, incontinence, genital sores, muscle weakness, suspicious skin lesions, transient blindness, difficulty walking, depression, unusual weight change, abnormal bleeding, enlarged lymph nodes, angioedema, and breast masses.    Physical Exam  General:  elderly frail, no distress Head:  Normocephalic and atraumatic without obvious abnormalities. No apparent alopecia or balding. Eyes:  No corneal or conjunctival inflammation noted. EOMI. Perrla. Funduscopic exam benign, without hemorrhages, exudates or papilledema. Vision grossly normal. Mouth:  Oral mucosa and oropharynx without lesions or exudates.  Teeth in good repair. Neck:  No deformities, masses, or tenderness noted. Lungs:  Normal respiratory effort, chest expands symmetrically. Lungs are clear to auscultation, no crackles or wheezes. Heart:  Normal rate and regular rhythm. S1 and S2 normal without gallop, murmur, click, rub or other extra sounds. Abdomen:  Bowel sounds positive,abdomen soft and non-tender without masses, organomegaly or hernias noted. Neurologic:  negative Tinel's normal.  Grip strength reflexes are brisk and equal   Impression & Recommendations:  Problem # 1:  HYPOTHYROIDISM (ICD-244.9)  Her updated medication list for this problem includes:    Synthroid 75 Mcg Tabs (  Levothyroxine sodium) .Marland Kitchen... 1 once daily    Her updated medication list for this problem  includes:    Synthroid 75 Mcg Tabs (Levothyroxine sodium) .Marland Kitchen... 1 once daily  Orders: TLB-TSH (Thyroid Stimulating Hormone) (84443-TSH) TLB-AST (SGOT) (84450-SGOT) Specimen Handling (91478)  Problem # 2:  HYPERTENSION (ICD-401.9)  Her updated medication list for this problem includes:    Diltiazem Hcl Er Beads 180 Mg Xr24h-cap (Diltiazem hcl er beads) .Marland Kitchen... 1 once daily    Toprol Xl 25 Mg Xr24h-tab (Metoprolol succinate) .Marland Kitchen... 1/2 once daily    Her updated medication list for this problem includes:    Diltiazem Hcl Er Beads 180 Mg Xr24h-cap (Diltiazem hcl er beads) .Marland Kitchen... 1 once daily    Toprol Xl 25 Mg Xr24h-tab (Metoprolol succinate) .Marland Kitchen... 1/2 once daily  Orders: TLB-AST (SGOT) (84450-SGOT) Specimen Handling (29562)  Problem # 3:  HYPERLIPIDEMIA (ICD-272.4)  Her updated medication list for this problem includes:    Lipitor 10 Mg Tabs (Atorvastatin calcium) .Marland Kitchen... 1 once daily    Her updated medication list for this problem includes:    Lipitor 10 Mg Tabs (Atorvastatin calcium) .Marland Kitchen... 1 once daily  Orders: Venipuncture (13086) TLB-Lipid Panel (80061-LIPID) TLB-AST (SGOT) (84450-SGOT) Specimen Handling (57846)  Problem # 4:  PARESTHESIA, HANDS (ICD-782.0) symptoms have been present for only one week and her clinical exam seems fairly benign.  Will treat with a modest dose of Depo-Medrol, and clinically observe.  If symptoms worsen or she develops long track signs.  Will consider a cervical MRI to rule out spinal stenosis or a centrally herniated disk  Complete Medication List: 1)  Diltiazem Hcl Er Beads 180 Mg Xr24h-cap (Diltiazem hcl er beads) .Marland Kitchen.. 1 once daily 2)  Lipitor 10 Mg Tabs (Atorvastatin calcium) .Marland Kitchen.. 1 once daily 3)  Toprol Xl 25 Mg Xr24h-tab (Metoprolol succinate) .... 1/2 once daily 4)  Synthroid 75 Mcg Tabs (Levothyroxine sodium) .Marland Kitchen.. 1 once daily 5)  Ventolin Hfa 108 (90 Base) Mcg/act Aers (Albuterol sulfate) .... Use qid as needed 6)  Zofran 8 Mg Tabs  (Ondansetron hcl) .... As needed 7)  Ultracet 37.5-325 Mg Tabs (Tramadol-acetaminophen) .... As needed 8)  Omeprazole 40 Mg Cpdr (Omeprazole) .... Once daily  [name] Instructions: 1)  Please schedule a follow-up appointment in 3 months. 2)  Limit your Sodium (Salt) to less than 2 grams a day(slightly less than 1/2 a teaspoon) to prevent fluid retention, swelling, or worsening of symptoms. 3)  It is important that you exercise regularly at least 20 minutes 5 times a week. If you develop chest pain, have severe difficulty breathing, or feel very tired , stop exercising immediately and seek medical attention. 4)  Check your Blood Pressure regularly. If it is above: 150/90 you should make an appointment.

## 2011-01-16 NOTE — Progress Notes (Signed)
  Phone Note Other Incoming   Caller: Burna Mortimer Action Taken: Information Sent Initial call taken by: Marijean Niemann LOV,stress over to [facility] to fax [phone] [facility]  December 22, 2009 12:11 PM

## 2011-01-16 NOTE — Progress Notes (Signed)
  Phone Note From Other Clinic   Caller: [facility] Details for Reason: Pt.Information Initial call taken by: KM    Faxed all Cardiac over to [phone] [facility]  February 06, 2010 1:41 PM

## 2011-01-16 NOTE — Assessment & Plan Note (Signed)
Summary: ?SINUS INF/OK DOC/NJR   Vital Signs:  Patient profile:   77 year old female Weight:      134 pounds Temp:     98.2 degrees F oral BP sitting:   132 / 70  (right arm) Cuff size:   regular  Vitals Entered By: Duard Brady LPN (September 28, 2010 10:55 AM) CC: c/o head and chest congestion, (L) ear and jaw pain , cough  Is Patient Diabetic? No   Primary Care Provider:  Dr.Peter Staci Righter  CC:  c/o head and chest congestion, (L) ear and jaw pain , and cough .  History of Present Illness: 77 -year-old patient, who is seen today with a 4 to 5 day history of head and chest congestion.  She does have a history of COPD.  Her main complaint is loss in the left facial area.  She has had minimal nonproductive cough.  There is been no fever, chills, or sputum production.  She does describe to him mildly more short of breath.  There's been no sinus drainage.  She has treated hypertension and dyslipidemia  Allergies: 1)  ! Bactrim (Sulfamethoxazole-Trimethoprim) 2)  ! Fluvirin (Influenza Vac Typ A&b Surf Ant)  Past History:  Past Medical History: Reviewed history from 03/22/2010 and no changes required. coronary artery disease Hyperlipidemia Hypertension rectal adenocarcinoma, sees Dr. Eli Hose for Oncology and Dr. Wendall Papa for GI care Anemia-iron deficiency COPD Headache Hypothyroidism Osteoporosis history of shingles  Past Surgical History: Reviewed history from 08/15/2010 and no changes required. Appendectomy Cholecystectomy Hysterectomy colonoscopy on August 16, 2009 revealed a rectal mass extending from the dentate line up to 5 cm status post cardiac angiogram, 1997, and 2002  Cardiolite  stress test February 2007-ejection fraction 68% with rest and 73% with stress.  normal test   cath 2002:  EF 70%, 20% prox LAD, mild  disease  removal rectum and 8 " colon Jan 2011 via APR per Dr. Manus Rudd  Review of Systems       The patient complains of  anorexia, prolonged cough, and headaches.  The patient denies fever, weight loss, weight gain, vision loss, decreased hearing, hoarseness, chest pain, syncope, dyspnea on exertion, peripheral edema, hemoptysis, abdominal pain, melena, hematochezia, severe indigestion/heartburn, hematuria, incontinence, genital sores, muscle weakness, suspicious skin lesions, transient blindness, difficulty walking, depression, unusual weight change, abnormal bleeding, enlarged lymph nodes, angioedema, and breast masses.    Physical Exam  General:  Well-developed,well-nourished,in no acute distress; alert,appropriate and cooperative throughout examination Head:  Normocephalic and atraumatic without obvious abnormalities. No apparent alopecia or balding. no focal tenderness Eyes:  No corneal or conjunctival inflammation noted. EOMI. Perrla. Funduscopic exam benign, without hemorrhages, exudates or papilledema. Vision grossly normal. Ears:  External ear exam shows no significant lesions or deformities.  Otoscopic examination reveals clear canals, tympanic membranes are intact bilaterally without bulging, retraction, inflammation or discharge. Hearing is grossly normal bilaterally. Mouth:  Oral mucosa and oropharynx without lesions or exudates.  Teeth in good repair. Neck:  No deformities, masses, or tenderness noted. Lungs:  Normal respiratory effort, chest expands symmetrically. Lungs are clear to auscultation, no crackles or wheezes.  O2 saturation 97% Heart:  Normal rate and regular rhythm. S1 and S2 normal without gallop, murmur, click, rub or other extra sounds.  pulse rate 64 Abdomen:  Bowel sounds positive,abdomen soft and non-tender without masses, organomegaly or hernias noted.   Impression & Recommendations:  Problem # 1:  URI (ICD-465.9)  Problem # 2:  COPD (ICD-496)  Her  updated medication list for this problem includes:    Ventolin Hfa 108 (90 Base) Mcg/act Aers (Albuterol sulfate) ..... Use qid  as needed  Problem # 3:  HYPERTENSION (ICD-401.9)  Her updated medication list for this problem includes:    Diltiazem Hcl Er Beads 180 Mg Xr24h-cap (Diltiazem hcl er beads) .Marland Kitchen... 1 once daily    Toprol Xl 25 Mg Xr24h-tab (Metoprolol succinate) .Marland Kitchen... 1/2 once daily  Complete Medication List: 1)  Diltiazem Hcl Er Beads 180 Mg Xr24h-cap (Diltiazem hcl er beads) .Marland Kitchen.. 1 once daily 2)  Lipitor 10 Mg Tabs (Atorvastatin calcium) .Marland Kitchen.. 1 once daily 3)  Toprol Xl 25 Mg Xr24h-tab (Metoprolol succinate) .... 1/2 once daily 4)  Synthroid 75 Mcg Tabs (Levothyroxine sodium) .Marland Kitchen.. 1 once daily 5)  Ventolin Hfa 108 (90 Base) Mcg/act Aers (Albuterol sulfate) .... Use qid as needed 6)  Zofran 8 Mg Tabs (Ondansetron hcl) .... As needed 7)  Ultracet 37.5-325 Mg Tabs (Tramadol-acetaminophen) .... As needed 8)  Omeprazole 40 Mg Cpdr (Omeprazole) .... Once daily  Patient Instructions: 1)  Get plenty of rest, drink lots of clear liquids, and use Tylenol or Ibuprofen for fever and comfort. Return in 7-10 days if you're not better:sooner if you're feeling worse. 2)  Nasonex use both nares daily 3)  MUCINEX-use twice daily 4)  Please schedule a follow-up appointment in 3 months.

## 2011-01-16 NOTE — Letter (Signed)
Summary: [facility]   Imported By: Maryln Gottron 01/27/2010 15:25:33  _____________________________________________________________________  External Attachment:    Type:   Image     Comment:   External Document

## 2011-01-16 NOTE — Letter (Signed)
Summary: [facility]  [facility]   Imported By: Maryln Gottron 09/19/2010 13:29:10  _____________________________________________________________________  External Attachment:    Type:   Image     Comment:   External Document

## 2011-01-18 NOTE — Letter (Signed)
Summary: [facility]  [facility]   Imported By: Maryln Gottron 12/12/2010 09:32:23  _____________________________________________________________________  External Attachment:    Type:   Image     Comment:   External Document

## 2011-02-21 ENCOUNTER — Other Ambulatory Visit: Payer: Self-pay | Admitting: Oncology

## 2011-02-21 ENCOUNTER — Encounter (HOSPITAL_BASED_OUTPATIENT_CLINIC_OR_DEPARTMENT_OTHER): Payer: Medicare Other | Admitting: Oncology

## 2011-02-21 DIAGNOSIS — C20 Malignant neoplasm of rectum: Secondary | ICD-10-CM

## 2011-02-21 LAB — COMPREHENSIVE METABOLIC PANEL
Albumin: 4.5 g/dL (ref 3.5–5.2)
Alkaline Phosphatase: 124 U/L — ABNORMAL HIGH (ref 39–117)
BUN: 19 mg/dL (ref 6–23)
Calcium: 10.8 mg/dL — ABNORMAL HIGH (ref 8.4–10.5)
Chloride: 104 mEq/L (ref 96–112)
Glucose, Bld: 131 mg/dL — ABNORMAL HIGH (ref 70–99)
Potassium: 4.1 mEq/L (ref 3.5–5.3)
Sodium: 139 mEq/L (ref 135–145)
Total Protein: 6.9 g/dL (ref 6.0–8.3)

## 2011-02-21 LAB — CBC WITH DIFFERENTIAL/PLATELET
Basophils Absolute: 0 10*3/uL (ref 0.0–0.1)
Eosinophils Absolute: 0.1 10*3/uL (ref 0.0–0.5)
HGB: 11.9 g/dL (ref 11.6–15.9)
MONO#: 0.4 10*3/uL (ref 0.1–0.9)
MONO%: 8.3 % (ref 0.0–14.0)
NEUT#: 4.3 10*3/uL (ref 1.5–6.5)
RBC: 4.08 10*6/uL (ref 3.70–5.45)
RDW: 15.1 % — ABNORMAL HIGH (ref 11.2–14.5)
WBC: 5.3 10*3/uL (ref 3.9–10.3)
lymph#: 0.4 10*3/uL — ABNORMAL LOW (ref 0.9–3.3)

## 2011-02-28 ENCOUNTER — Telehealth: Payer: Self-pay | Admitting: Gastroenterology

## 2011-02-28 ENCOUNTER — Encounter (HOSPITAL_BASED_OUTPATIENT_CLINIC_OR_DEPARTMENT_OTHER): Payer: Medicare Other | Admitting: Oncology

## 2011-02-28 DIAGNOSIS — C20 Malignant neoplasm of rectum: Secondary | ICD-10-CM

## 2011-03-04 LAB — GLUCOSE, CAPILLARY
Glucose-Capillary: 103 mg/dL — ABNORMAL HIGH (ref 70–99)
Glucose-Capillary: 117 mg/dL — ABNORMAL HIGH (ref 70–99)
Glucose-Capillary: 118 mg/dL — ABNORMAL HIGH (ref 70–99)
Glucose-Capillary: 128 mg/dL — ABNORMAL HIGH (ref 70–99)
Glucose-Capillary: 130 mg/dL — ABNORMAL HIGH (ref 70–99)
Glucose-Capillary: 135 mg/dL — ABNORMAL HIGH (ref 70–99)
Glucose-Capillary: 91 mg/dL (ref 70–99)
Glucose-Capillary: 94 mg/dL (ref 70–99)
Glucose-Capillary: 103 mg/dL — ABNORMAL HIGH (ref 70–99)
Glucose-Capillary: 106 mg/dL — ABNORMAL HIGH (ref 70–99)
Glucose-Capillary: 106 mg/dL — ABNORMAL HIGH (ref 70–99)
Glucose-Capillary: 114 mg/dL — ABNORMAL HIGH (ref 70–99)
Glucose-Capillary: 115 mg/dL — ABNORMAL HIGH (ref 70–99)
Glucose-Capillary: 116 mg/dL — ABNORMAL HIGH (ref 70–99)
Glucose-Capillary: 117 mg/dL — ABNORMAL HIGH (ref 70–99)
Glucose-Capillary: 119 mg/dL — ABNORMAL HIGH (ref 70–99)
Glucose-Capillary: 130 mg/dL — ABNORMAL HIGH (ref 70–99)

## 2011-03-04 LAB — BASIC METABOLIC PANEL
BUN: 4 mg/dL — ABNORMAL LOW (ref 6–23)
BUN: 11 mg/dL (ref 6–23)
BUN: 7 mg/dL (ref 6–23)
CO2: 22 mEq/L (ref 19–32)
CO2: 24 mEq/L (ref 19–32)
CO2: 24 mEq/L (ref 19–32)
CO2: 27 mEq/L (ref 19–32)
CO2: 23 mEq/L (ref 19–32)
Calcium: 8.4 mg/dL (ref 8.4–10.5)
Calcium: 9.7 mg/dL (ref 8.4–10.5)
Calcium: 9.4 mg/dL (ref 8.4–10.5)
Calcium: 9.5 mg/dL (ref 8.4–10.5)
Calcium: 9.5 mg/dL (ref 8.4–10.5)
Chloride: 106 mEq/L (ref 96–112)
Chloride: 108 mEq/L (ref 96–112)
Creatinine, Ser: 0.68 mg/dL (ref 0.4–1.2)
Creatinine, Ser: 0.81 mg/dL (ref 0.4–1.2)
Creatinine, Ser: 0.56 mg/dL (ref 0.4–1.2)
GFR calc Af Amer: >60 mL/min (ref >60)
GFR calc Af Amer: >60 mL/min (ref >60)
GFR calc Af Amer: >60 mL/min (ref >60)
GFR calc Af Amer: >60 mL/min (ref >60)
GFR calc non Af Amer: >60 mL/min (ref >60)
GFR calc non Af Amer: >60 mL/min (ref >60)
GFR calc non Af Amer: 59 mL/min — ABNORMAL LOW (ref >60)
GFR calc non Af Amer: >60 mL/min (ref >60)
GFR calc non Af Amer: >60 mL/min (ref >60)
GFR calc non Af Amer: >60 mL/min (ref >60)
Glucose, Bld: 102 mg/dL — ABNORMAL HIGH (ref 70–99)
Glucose, Bld: 109 mg/dL — ABNORMAL HIGH (ref 70–99)
Glucose, Bld: 106 mg/dL — ABNORMAL HIGH (ref 70–99)
Glucose, Bld: 121 mg/dL — ABNORMAL HIGH (ref 70–99)
Potassium: 3.9 mEq/L (ref 3.5–5.1)
Potassium: 4.4 mEq/L (ref 3.5–5.1)
Potassium: 4 mEq/L (ref 3.5–5.1)
Sodium: 133 mEq/L — ABNORMAL LOW (ref 135–145)
Sodium: 136 mEq/L (ref 135–145)
Sodium: 136 mEq/L (ref 135–145)
Sodium: 137 mEq/L (ref 135–145)
Sodium: 137 mEq/L (ref 135–145)
Sodium: 136 mEq/L (ref 135–145)

## 2011-03-04 LAB — CROSSMATCH

## 2011-03-04 LAB — BLOOD GAS, ARTERIAL
Acid-base deficit: 4.1 mmol/L — ABNORMAL HIGH (ref 0.0–2.0)
O2 Content: 10 L/min
pCO2 arterial: 43.3 mmHg (ref 35.0–45.0)
pH, Arterial: 7.31 — ABNORMAL LOW (ref 7.350–7.400)
pO2, Arterial: 194 mmHg — ABNORMAL HIGH (ref 80.0–100.0)

## 2011-03-04 LAB — COMPREHENSIVE METABOLIC PANEL
AST: 19 U/L (ref 0–37)
Albumin: 3.7 g/dL (ref 3.5–5.2)
Albumin: 3.4 g/dL — ABNORMAL LOW (ref 3.5–5.2)
Alkaline Phosphatase: 72 U/L (ref 39–117)
BUN: 16 mg/dL (ref 6–23)
BUN: 8 mg/dL (ref 6–23)
Calcium: 10.6 mg/dL — ABNORMAL HIGH (ref 8.4–10.5)
Creatinine, Ser: 0.68 mg/dL (ref 0.4–1.2)
GFR calc Af Amer: >60 mL/min (ref >60)
Potassium: 4.2 mEq/L (ref 3.5–5.1)
Potassium: 4.5 mEq/L (ref 3.5–5.1)
Total Protein: 6.9 g/dL (ref 6.0–8.3)
Total Protein: 6.3 g/dL (ref 6.0–8.3)

## 2011-03-04 LAB — POCT I-STAT 7, (LYTES, BLD GAS, ICA,H+H)
Calcium, Ion: 1.32 mmol/L (ref 1.12–1.32)
Hemoglobin: 7.1 g/dL — ABNORMAL LOW (ref 12.0–15.0)
O2 Saturation: 100 %
Patient temperature: 35.4
TCO2: 26 mmol/L (ref 0–100)
pCO2 arterial: 42.3 mmHg (ref 35.0–45.0)
pH, Arterial: 7.364 (ref 7.350–7.400)
pO2, Arterial: 485 mmHg — ABNORMAL HIGH (ref 80.0–100.0)

## 2011-03-04 LAB — CBC
HCT: 30.2 % — ABNORMAL LOW (ref 36.0–46.0)
HCT: 31.5 % — ABNORMAL LOW (ref 36.0–46.0)
HCT: 39 % (ref 36.0–46.0)
Hemoglobin: 10 g/dL — ABNORMAL LOW (ref 12.0–15.0)
Hemoglobin: 10.3 g/dL — ABNORMAL LOW (ref 12.0–15.0)
Hemoglobin: 10.6 g/dL — ABNORMAL LOW (ref 12.0–15.0)
Hemoglobin: 10.7 g/dL — ABNORMAL LOW (ref 12.0–15.0)
MCHC: 34.2 g/dL (ref 30.0–36.0)
MCHC: 34.2 g/dL (ref 30.0–36.0)
MCHC: 34.5 g/dL (ref 30.0–36.0)
MCHC: 34.9 g/dL (ref 30.0–36.0)
MCHC: 33.9 g/dL (ref 30.0–36.0)
MCV: 89.7 fL (ref 78.0–100.0)
MCV: 90.2 fL (ref 78.0–100.0)
MCV: 91.1 fL (ref 78.0–100.0)
Platelets: 166 10*3/uL (ref 150–400)
Platelets: 262 10*3/uL (ref 150–400)
Platelets: 246 10*3/uL (ref 150–400)
Platelets: 247 10*3/uL (ref 150–400)
Platelets: 294 10*3/uL (ref 150–400)
RBC: 3.38 MIL/uL — ABNORMAL LOW (ref 3.87–5.11)
RBC: 3.45 MIL/uL — ABNORMAL LOW (ref 3.87–5.11)
RBC: 4.04 MIL/uL (ref 3.87–5.11)
RBC: 3.65 MIL/uL — ABNORMAL LOW (ref 3.87–5.11)
RDW: 16.8 % — ABNORMAL HIGH (ref 11.5–15.5)
RDW: 17.5 % — ABNORMAL HIGH (ref 11.5–15.5)
RDW: 16.5 % — ABNORMAL HIGH (ref 11.5–15.5)
RDW: 16.6 % — ABNORMAL HIGH (ref 11.5–15.5)
RDW: 16.6 % — ABNORMAL HIGH (ref 11.5–15.5)
RDW: 16.7 % — ABNORMAL HIGH (ref 11.5–15.5)
WBC: 10.4 10*3/uL (ref 4.0–10.5)
WBC: 10.5 10*3/uL (ref 4.0–10.5)
WBC: 6.2 10*3/uL (ref 4.0–10.5)

## 2011-03-04 LAB — DIFFERENTIAL
Eosinophils Relative: 2 % (ref 0–5)
Lymphocytes Relative: 11 % — ABNORMAL LOW (ref 12–46)
Monocytes Absolute: 0.5 10*3/uL (ref 0.1–1.0)
Monocytes Relative: 8 % (ref 3–12)
Neutro Abs: 4.9 10*3/uL (ref 1.7–7.7)

## 2011-03-04 LAB — POCT I-STAT 4, (NA,K, GLUC, HGB,HCT): Potassium: 4.4 mEq/L (ref 3.5–5.1)

## 2011-03-04 LAB — POCT HEMOGLOBIN-HEMACUE: Hemoglobin: 12.7 g/dL (ref 12.0–15.0)

## 2011-03-04 LAB — CEA: CEA: 1.7 ng/mL (ref 0.0–5.0)

## 2011-03-06 LAB — URINE MICROSCOPIC-ADD ON

## 2011-03-06 LAB — BASIC METABOLIC PANEL
BUN: 3 mg/dL — ABNORMAL LOW (ref 6–23)
BUN: 6 mg/dL (ref 6–23)
BUN: 8 mg/dL (ref 6–23)
CO2: 25 mEq/L (ref 19–32)
CO2: 28 mEq/L (ref 19–32)
CO2: 29 mEq/L (ref 19–32)
Calcium: 9 mg/dL (ref 8.4–10.5)
Calcium: 9 mg/dL (ref 8.4–10.5)
Calcium: 9.4 mg/dL (ref 8.4–10.5)
Calcium: 9.8 mg/dL (ref 8.4–10.5)
Chloride: 107 mEq/L (ref 96–112)
Chloride: 109 mEq/L (ref 96–112)
Creatinine, Ser: 0.58 mg/dL (ref 0.4–1.2)
Creatinine, Ser: 0.72 mg/dL (ref 0.4–1.2)
Creatinine, Ser: 0.77 mg/dL (ref 0.4–1.2)
Creatinine, Ser: 0.85 mg/dL (ref 0.4–1.2)
GFR calc Af Amer: >60 mL/min (ref >60)
GFR calc Af Amer: >60 mL/min (ref >60)
GFR calc Af Amer: >60 mL/min (ref >60)
GFR calc non Af Amer: >60 mL/min (ref >60)
GFR calc non Af Amer: >60 mL/min (ref >60)
Glucose, Bld: 132 mg/dL — ABNORMAL HIGH (ref 70–99)
Glucose, Bld: 137 mg/dL — ABNORMAL HIGH (ref 70–99)
Glucose, Bld: 153 mg/dL — ABNORMAL HIGH (ref 70–99)
Potassium: 3.9 mEq/L (ref 3.5–5.1)
Potassium: 4.1 mEq/L (ref 3.5–5.1)
Potassium: 4.2 mEq/L (ref 3.5–5.1)
Sodium: 133 mEq/L — ABNORMAL LOW (ref 135–145)
Sodium: 135 mEq/L (ref 135–145)

## 2011-03-06 LAB — COMPREHENSIVE METABOLIC PANEL
ALT: 13 U/L (ref 0–35)
ALT: <8 U/L (ref 0–35)
Albumin: 2.3 g/dL — ABNORMAL LOW (ref 3.5–5.2)
Albumin: 2.5 g/dL — ABNORMAL LOW (ref 3.5–5.2)
Alkaline Phosphatase: 78 U/L (ref 39–117)
Alkaline Phosphatase: 79 U/L (ref 39–117)
Alkaline Phosphatase: 91 U/L (ref 39–117)
BUN: 14 mg/dL (ref 6–23)
BUN: 6 mg/dL (ref 6–23)
CO2: 27 mEq/L (ref 19–32)
Calcium: 9.1 mg/dL (ref 8.4–10.5)
Chloride: 106 mEq/L (ref 96–112)
Creatinine, Ser: 0.59 mg/dL (ref 0.4–1.2)
Glucose, Bld: 102 mg/dL — ABNORMAL HIGH (ref 70–99)
Glucose, Bld: 119 mg/dL — ABNORMAL HIGH (ref 70–99)
Glucose, Bld: 133 mg/dL — ABNORMAL HIGH (ref 70–99)
Potassium: 3.3 mEq/L — ABNORMAL LOW (ref 3.5–5.1)
Potassium: 3.6 mEq/L (ref 3.5–5.1)
Potassium: 4.2 mEq/L (ref 3.5–5.1)
Sodium: 136 mEq/L (ref 135–145)
Sodium: 136 mEq/L (ref 135–145)
Total Bilirubin: 0.4 mg/dL (ref 0.3–1.2)
Total Protein: 4.6 g/dL — ABNORMAL LOW (ref 6.0–8.3)
Total Protein: 6.2 g/dL (ref 6.0–8.3)

## 2011-03-06 LAB — DIFFERENTIAL
Basophils Absolute: 0 10*3/uL (ref 0.0–0.1)
Basophils Relative: 0 % (ref 0–1)
Basophils Relative: 0 % (ref 0–1)
Basophils Relative: 0 % (ref 0–1)
Eosinophils Absolute: 0 10*3/uL (ref 0.0–0.7)
Eosinophils Absolute: 0.2 10*3/uL (ref 0.0–0.7)
Lymphocytes Relative: 7 % — ABNORMAL LOW (ref 12–46)
Monocytes Absolute: 0.5 10*3/uL (ref 0.1–1.0)
Monocytes Absolute: 0.6 10*3/uL (ref 0.1–1.0)
Monocytes Relative: 11 % (ref 3–12)
Monocytes Relative: 9 % (ref 3–12)
Neutro Abs: 2.7 10*3/uL (ref 1.7–7.7)
Neutro Abs: 4.3 10*3/uL (ref 1.7–7.7)
Neutrophils Relative %: 80 % — ABNORMAL HIGH (ref 43–77)
Neutrophils Relative %: 85 % — ABNORMAL HIGH (ref 43–77)

## 2011-03-06 LAB — GLUCOSE, CAPILLARY
Glucose-Capillary: 100 mg/dL — ABNORMAL HIGH (ref 70–99)
Glucose-Capillary: 120 mg/dL — ABNORMAL HIGH (ref 70–99)
Glucose-Capillary: 128 mg/dL — ABNORMAL HIGH (ref 70–99)
Glucose-Capillary: 131 mg/dL — ABNORMAL HIGH (ref 70–99)
Glucose-Capillary: 140 mg/dL — ABNORMAL HIGH (ref 70–99)
Glucose-Capillary: 140 mg/dL — ABNORMAL HIGH (ref 70–99)
Glucose-Capillary: 145 mg/dL — ABNORMAL HIGH (ref 70–99)
Glucose-Capillary: 165 mg/dL — ABNORMAL HIGH (ref 70–99)
Glucose-Capillary: 169 mg/dL — ABNORMAL HIGH (ref 70–99)
Glucose-Capillary: 98 mg/dL (ref 70–99)

## 2011-03-06 LAB — CBC
HCT: 25.4 % — ABNORMAL LOW (ref 36.0–46.0)
HCT: 28.4 % — ABNORMAL LOW (ref 36.0–46.0)
HCT: 29.9 % — ABNORMAL LOW (ref 36.0–46.0)
HCT: 30.9 % — ABNORMAL LOW (ref 36.0–46.0)
Hemoglobin: 10.3 g/dL — ABNORMAL LOW (ref 12.0–15.0)
Hemoglobin: 11.7 g/dL — ABNORMAL LOW (ref 12.0–15.0)
Hemoglobin: 8.8 g/dL — ABNORMAL LOW (ref 12.0–15.0)
Hemoglobin: 9.8 g/dL — ABNORMAL LOW (ref 12.0–15.0)
MCHC: 34.2 g/dL (ref 30.0–36.0)
MCHC: 34.2 g/dL (ref 30.0–36.0)
MCHC: 34.5 g/dL (ref 30.0–36.0)
MCV: 89.4 fL (ref 78.0–100.0)
MCV: 89.5 fL (ref 78.0–100.0)
MCV: 89.7 fL (ref 78.0–100.0)
Platelets: 153 10*3/uL (ref 150–400)
Platelets: 168 10*3/uL (ref 150–400)
Platelets: 182 10*3/uL (ref 150–400)
RBC: 3.45 MIL/uL — ABNORMAL LOW (ref 3.87–5.11)
RBC: 3.86 MIL/uL — ABNORMAL LOW (ref 3.87–5.11)
RDW: 17.1 % — ABNORMAL HIGH (ref 11.5–15.5)
RDW: 17.2 % — ABNORMAL HIGH (ref 11.5–15.5)
RDW: 17.3 % — ABNORMAL HIGH (ref 11.5–15.5)
RDW: 17.7 % — ABNORMAL HIGH (ref 11.5–15.5)
WBC: 3.8 10*3/uL — ABNORMAL LOW (ref 4.0–10.5)
WBC: 9.5 10*3/uL (ref 4.0–10.5)

## 2011-03-06 LAB — URINALYSIS, ROUTINE W REFLEX MICROSCOPIC
Glucose, UA: NEGATIVE mg/dL
Hgb urine dipstick: NEGATIVE
Ketones, ur: 15 mg/dL — AB
pH: 6.5 (ref 5.0–8.0)

## 2011-03-06 LAB — POCT I-STAT, CHEM 8
BUN: 11 mg/dL (ref 6–23)
Calcium, Ion: 1.32 mmol/L (ref 1.12–1.32)
Chloride: 103 mEq/L (ref 96–112)
Creatinine, Ser: 0.8 mg/dL (ref 0.4–1.2)
Glucose, Bld: 105 mg/dL — ABNORMAL HIGH (ref 70–99)
HCT: 35 % — ABNORMAL LOW (ref 36.0–46.0)

## 2011-03-06 LAB — URINE CULTURE

## 2011-03-06 LAB — MAGNESIUM: Magnesium: 2.2 mg/dL (ref 1.5–2.5)

## 2011-03-06 LAB — PHOSPHORUS
Phosphorus: 2.6 mg/dL (ref 2.3–4.6)
Phosphorus: 2.7 mg/dL (ref 2.3–4.6)
Phosphorus: 2.9 mg/dL (ref 2.3–4.6)
Phosphorus: 3.6 mg/dL (ref 2.3–4.6)
Phosphorus: 3.7 mg/dL (ref 2.3–4.6)

## 2011-03-06 LAB — PROTIME-INR: Prothrombin Time: 12.6 seconds (ref 11.6–15.2)

## 2011-03-06 LAB — PREALBUMIN: Prealbumin: 8.8 mg/dL — ABNORMAL LOW (ref 18.0–45.0)

## 2011-03-06 NOTE — Progress Notes (Signed)
Summary: Office Visit   Phone Note From Other Clinic   Caller: [facility] SHADAD [phone] Call For: DR Icarus Partch Reason for Call: Schedule Patient Appt Summary of Call: Wants to schedule patient for a Direct Colon -  Told me no gi hx, but I saw [facility] Dr Christella Hartigan did & I think a Flex sig too. Also saw some notes from maybe a Colon Cancer Surgery? Maybe needs ofice visit first? There are no available appts. Initial call taken by: Leanor Kail [facility],  February 28, 2011 2:02 PM  Follow-up for Phone Call        Dr Christella Hartigan do you want to see the pt in the office she has a hisotry of rectal cancer, Dr Teddy Spike office states she is having no problems at this time per [facility]? Follow-up by: Chales Abrahams CMA Duncan Dull),  February 28, 2011 3:21 PM  Additional Follow-up for Phone Call Additional follow up Details #1::        she's had rectal cancer in 2010 so needs colonoscopy now, but she is 34 and I'd like to meet her in office first, get an idea how healthy she is overall Additional Follow-up by: Rachael Fee MD,  March 01, 2011 8:28 AM    Additional Follow-up for Phone Call Additional follow up Details #2::    pt scheduled for f/u and Wilma will notify pt Follow-up by: Chales Abrahams CMA Duncan Dull),  March 01, 2011 8:33 AM

## 2011-03-07 LAB — CBC
HCT: 30.4 % — ABNORMAL LOW (ref 36.0–46.0)
HCT: 34.6 % — ABNORMAL LOW (ref 36.0–46.0)
Hemoglobin: 10.2 g/dL — ABNORMAL LOW (ref 12.0–15.0)
Hemoglobin: 11.6 g/dL — ABNORMAL LOW (ref 12.0–15.0)
MCHC: 33.7 g/dL (ref 30.0–36.0)
MCHC: 34.4 g/dL (ref 30.0–36.0)
MCV: 88.1 fL (ref 78.0–100.0)
Platelets: 139 10*3/uL — ABNORMAL LOW (ref 150–400)
Platelets: 154 10*3/uL (ref 150–400)
Platelets: 275 10*3/uL (ref 150–400)
RDW: 16.8 % — ABNORMAL HIGH (ref 11.5–15.5)
RDW: 17 % — ABNORMAL HIGH (ref 11.5–15.5)
RDW: 15 % (ref 11.5–15.5)
WBC: 2.4 10*3/uL — ABNORMAL LOW (ref 4.0–10.5)

## 2011-03-07 LAB — BASIC METABOLIC PANEL
BUN: 6 mg/dL (ref 6–23)
CO2: 28 mEq/L (ref 19–32)
Chloride: 106 mEq/L (ref 96–112)
Creatinine, Ser: 0.75 mg/dL (ref 0.4–1.2)
GFR calc Af Amer: >60 mL/min (ref >60)
Glucose, Bld: 160 mg/dL — ABNORMAL HIGH (ref 70–99)
Potassium: 3.5 mEq/L (ref 3.5–5.1)
Potassium: 3.8 mEq/L (ref 3.5–5.1)
Sodium: 133 mEq/L — ABNORMAL LOW (ref 135–145)
Sodium: 137 mEq/L (ref 135–145)

## 2011-03-07 LAB — POCT I-STAT, CHEM 8
BUN: 13 mg/dL (ref 6–23)
Calcium, Ion: 1.28 mmol/L (ref 1.12–1.32)
Chloride: 101 mEq/L (ref 96–112)
Creatinine, Ser: 0.8 mg/dL (ref 0.4–1.2)
TCO2: 31 mmol/L (ref 0–100)

## 2011-03-07 LAB — MAGNESIUM: Magnesium: 2.1 mg/dL (ref 1.5–2.5)

## 2011-03-07 LAB — DIFFERENTIAL
Band Neutrophils: 0 % (ref 0–10)
Blasts: 0 %
Blasts: 0 %
Eosinophils Absolute: 0.1 10*3/uL (ref 0.0–0.7)
Eosinophils Relative: 3 % (ref 0–5)
Lymphocytes Relative: 14 % (ref 12–46)
Lymphs Abs: 0.3 10*3/uL — ABNORMAL LOW (ref 0.7–4.0)
Metamyelocytes Relative: 0 %
Monocytes Absolute: 0.4 10*3/uL (ref 0.1–1.0)
Monocytes Absolute: 0.6 10*3/uL (ref 0.1–1.0)
Monocytes Relative: 24 % — ABNORMAL HIGH (ref 3–12)
Myelocytes: 0 %
Neutro Abs: 1.4 10*3/uL — ABNORMAL LOW (ref 1.7–7.7)
Neutrophils Relative %: 59 % (ref 43–77)
Promyelocytes Absolute: 0 %
nRBC: 0 /100 WBC
nRBC: 0 /100 WBC

## 2011-03-07 LAB — URINALYSIS, ROUTINE W REFLEX MICROSCOPIC
Glucose, UA: NEGATIVE mg/dL
Hgb urine dipstick: NEGATIVE
Ketones, ur: NEGATIVE mg/dL
Protein, ur: NEGATIVE mg/dL
pH: 7 (ref 5.0–8.0)

## 2011-03-07 LAB — PHOSPHORUS: Phosphorus: 2.6 mg/dL (ref 2.3–4.6)

## 2011-03-07 LAB — PROTIME-INR
INR: 0.9 (ref 0.00–1.49)
INR: 0.97 (ref 0.00–1.49)
Prothrombin Time: 12.8 seconds (ref 11.6–15.2)

## 2011-03-07 LAB — URINE MICROSCOPIC-ADD ON

## 2011-03-07 LAB — URINE CULTURE: Colony Count: >=100000

## 2011-03-07 LAB — APTT: aPTT: 30 seconds (ref 24–37)

## 2011-03-12 ENCOUNTER — Other Ambulatory Visit: Payer: Self-pay | Admitting: Internal Medicine

## 2011-03-12 ENCOUNTER — Other Ambulatory Visit: Payer: Self-pay

## 2011-03-12 MED ORDER — TRAMADOL-ACETAMINOPHEN 37.5-325 MG PO TABS: 1.0000 | ORAL_TABLET | Freq: Four times a day (QID) | ORAL | Status: DC | PRN

## 2011-03-12 MED ORDER — ALBUTEROL SULFATE HFA 108 (90 BASE) MCG/ACT IN AERS: 2.0000 | INHALATION_SPRAY | Freq: Four times a day (QID) | RESPIRATORY_TRACT | Status: DC

## 2011-03-12 MED ORDER — DILTIAZEM HCL ER BEADS 180 MG PO CP24: 180.0000 mg | ORAL_CAPSULE | Freq: Every day | ORAL | Status: DC

## 2011-03-12 MED ORDER — METOPROLOL SUCCINATE ER 25 MG PO TB24: 25.0000 mg | ORAL_TABLET | Freq: Every day | ORAL | Status: DC

## 2011-03-12 MED ORDER — LEVOTHYROXINE SODIUM 75 MCG PO TABS: 75.0000 ug | ORAL_TABLET | Freq: Every day | ORAL | Status: DC

## 2011-03-12 NOTE — Telephone Encounter (Signed)
Pt needs written scripts for Albuterol 108 (90 base,Lipitor 10mg , Diltiazem 180 mg, Levothyroxine 75 mcg(Synthroid), Metoprolol 25 mg (Toprol XL), Tramadol 37.5 -325 mg. Pls call when scripts are ready for pick up.

## 2011-03-12 NOTE — Telephone Encounter (Signed)
Printed rx's and called pt - rx's ready for pick up. KIK

## 2011-03-12 NOTE — Telephone Encounter (Signed)
rx's ready for pick up KIK

## 2011-03-13 ENCOUNTER — Ambulatory Visit (INDEPENDENT_AMBULATORY_CARE_PROVIDER_SITE_OTHER): Payer: Medicare Other | Admitting: Gastroenterology

## 2011-03-13 ENCOUNTER — Other Ambulatory Visit: Payer: Self-pay

## 2011-03-13 ENCOUNTER — Encounter: Payer: Self-pay | Admitting: Gastroenterology

## 2011-03-13 VITALS — BP 144/72 | HR 72 | Wt 140.8 lb

## 2011-03-13 DIAGNOSIS — C20 Malignant neoplasm of rectum: Secondary | ICD-10-CM | POA: Insufficient documentation

## 2011-03-13 DIAGNOSIS — Z85048 Personal history of other malignant neoplasm of rectum, rectosigmoid junction, and anus: Secondary | ICD-10-CM

## 2011-03-13 MED ORDER — ATORVASTATIN CALCIUM 10 MG PO TABS: 10.0000 mg | ORAL_TABLET | Freq: Every day | ORAL | Status: DC

## 2011-03-13 MED ORDER — PEG-KCL-NACL-NASULF-NA ASC-C 100 G PO SOLR: 1.0000 | ORAL | Status: DC

## 2011-03-13 NOTE — Patient Instructions (Signed)
You will be set up for a colonoscopy. A copy of this information will be made available to Dr. Juliette Alcide, Dr. Amador Cunas, Dr. [name].

## 2011-03-13 NOTE — Progress Notes (Signed)
Review of pertinent gastrointestinal problems: 1. Rectal cancer  Diagnosed by incomplete colonoscopy at outside facility August 2010. Endoscopic ultrasound by me  September 2010 showed a T3N0 rectal adenocarcinoma;  She underwent neoadjuvant chemotherapy and radiation. Eventual abdominal peritoneal resection January 2011 , surgical pathology showed T2 N1 lesion. Adjuvant chemotherapy for 2 cycles caused a lot of symptoms and she stopped.   HPI: This is a  Very pleasant 77 year old woman whom I last saw at the time of endoscopic ultrasound about a year and a half ago, see those results as summarized above.  Sees Dr. Juliette Alcide.  She takes miralax daily one packet (about 2 a week).  No  Bleeding.  No diarrhea.  Feels overall well.     she is really here to discuss surveillance colonoscopy. as  Current outpatient prescriptions:albuterol (VENTOLIN HFA) 108 (90 BASE) MCG/ACT inhaler, Inhale 2 puffs into the lungs 4 (four) times daily., Disp: 3 Inhaler, Rfl: 6;  atorvastatin (LIPITOR) 10 MG tablet, Take 10 mg by mouth daily.  , Disp: , Rfl: ;  diltiazem (TIAZAC) 180 MG 24 hr capsule, Take 1 capsule (180 mg total) by mouth daily., Disp: 90 capsule, Rfl: 2 levothyroxine (SYNTHROID) 75 MCG tablet, Take 1 tablet (75 mcg total) by mouth daily., Disp: 90 tablet, Rfl: 2;  metoprolol succinate (TOPROL XL) 25 MG 24 hr tablet, Take 1 tablet (25 mg total) by mouth daily. 1/2 daily, Disp: 90 tablet, Rfl: 2;  traMADol-acetaminophen (ULTRACET) 37.5-325 MG per tablet, Take 1 tablet by mouth every 6 (six) hours as needed., Disp: 90 tablet, Rfl: 2 cyclobenzaprine (FLEXERIL) 5 MG tablet, Take 5 mg by mouth 3 (three) times daily as needed.  , Disp: , Rfl:   Allergies  Allergen Reactions  . Kdc:Albumin, Egg+Flu Virus Vaccine+Neomycin   . Sulfamethoxazole W/Trimethoprim      Physical Exam: BP 144/72  Pulse 72  Wt 140 lb 12.8 oz (63.866 kg) Constitutional: generally well-appearing Psychiatric: alert and oriented  x3 Abdomen: soft, nontender, nondistended, no obvious ascites, no peritoneal signs, normal bowel sounds   Assessment and plan:  previous rectal cancer  We will proceed with full colonoscopy at her service convenience. I do note that the previous colonoscopy she had when the rectal cancer was diagnosed was incomplete by outside physician.

## 2011-03-21 ENCOUNTER — Ambulatory Visit (AMBULATORY_SURGERY_CENTER): Payer: Medicare Other | Admitting: Gastroenterology

## 2011-03-21 ENCOUNTER — Encounter: Payer: Self-pay | Admitting: Gastroenterology

## 2011-03-21 VITALS — HR 78 | Temp 98.6°F | Resp 18 | Ht 63.5 in | Wt 138.0 lb

## 2011-03-21 DIAGNOSIS — Z85038 Personal history of other malignant neoplasm of large intestine: Secondary | ICD-10-CM

## 2011-03-21 DIAGNOSIS — D490 Neoplasm of unspecified behavior of digestive system: Secondary | ICD-10-CM

## 2011-03-21 DIAGNOSIS — D126 Benign neoplasm of colon, unspecified: Secondary | ICD-10-CM

## 2011-03-21 LAB — CROSSMATCH

## 2011-03-21 LAB — ABO/RH: ABO/RH(D): B POS

## 2011-03-21 MED ORDER — SODIUM CHLORIDE 0.9 % IV SOLN: 500.0000 mL | INTRAVENOUS | Status: DC

## 2011-03-21 NOTE — Progress Notes (Signed)
Sclera therapy done with epinephrine and spot to mark the area in ascending colon.

## 2011-03-21 NOTE — Patient Instructions (Signed)
Please review discharge instructions.

## 2011-03-22 ENCOUNTER — Telehealth: Payer: Self-pay | Admitting: *Deleted

## 2011-03-22 NOTE — Telephone Encounter (Signed)

## 2011-03-27 ENCOUNTER — Telehealth: Payer: Self-pay | Admitting: Gastroenterology

## 2011-03-27 NOTE — Telephone Encounter (Signed)
Pt advised that the results are not yet available.  I will call her when the results are reviewed

## 2011-03-28 NOTE — Progress Notes (Signed)
Pt aware and will call when she is ready to schedule repeat colon.  She will be out of town for a couple of weeks.

## 2011-05-15 ENCOUNTER — Ambulatory Visit: Payer: Self-pay | Admitting: Internal Medicine

## 2011-09-15 IMAGING — CR DG ABDOMEN ACUTE W/ 1V CHEST
3 series · 3 of 3 positions shown · non-contrast
Comparison: 02/08/2010.  CT 03/23/2010.

CLINICAL DATA: History given of small-bowel obstruction.

ACUTE ABDOMEN SERIES (ABDOMEN 2 VIEW & CHEST 1 VIEW)

[w chest pa]
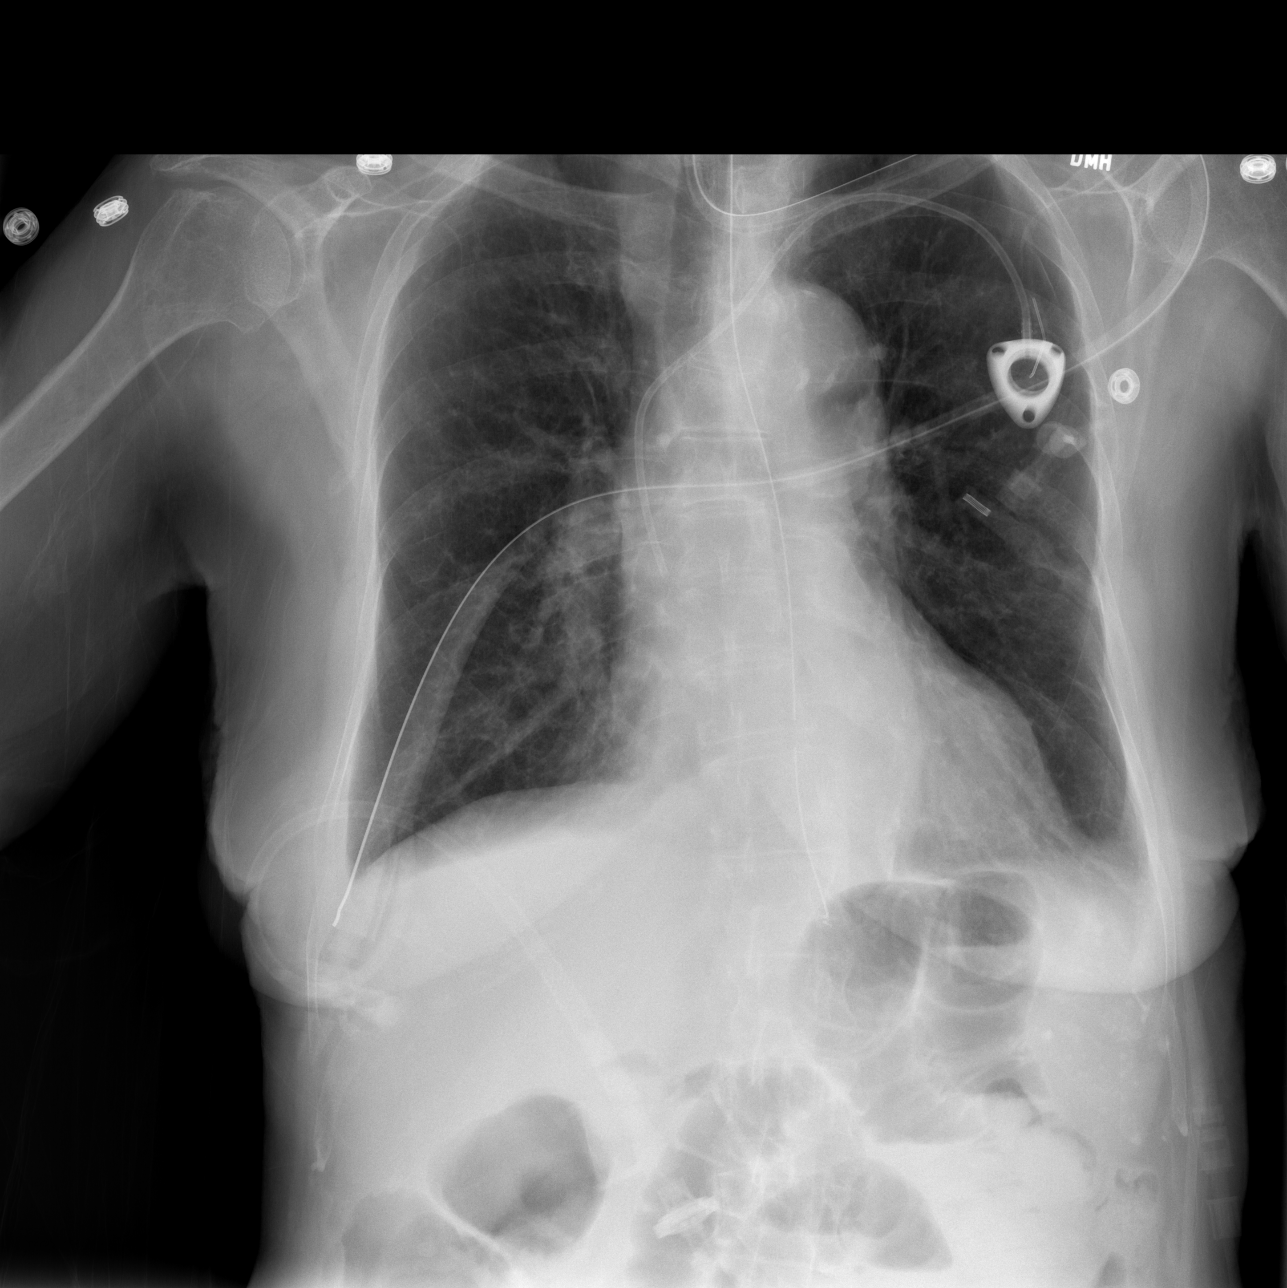

[w abdomen upright]
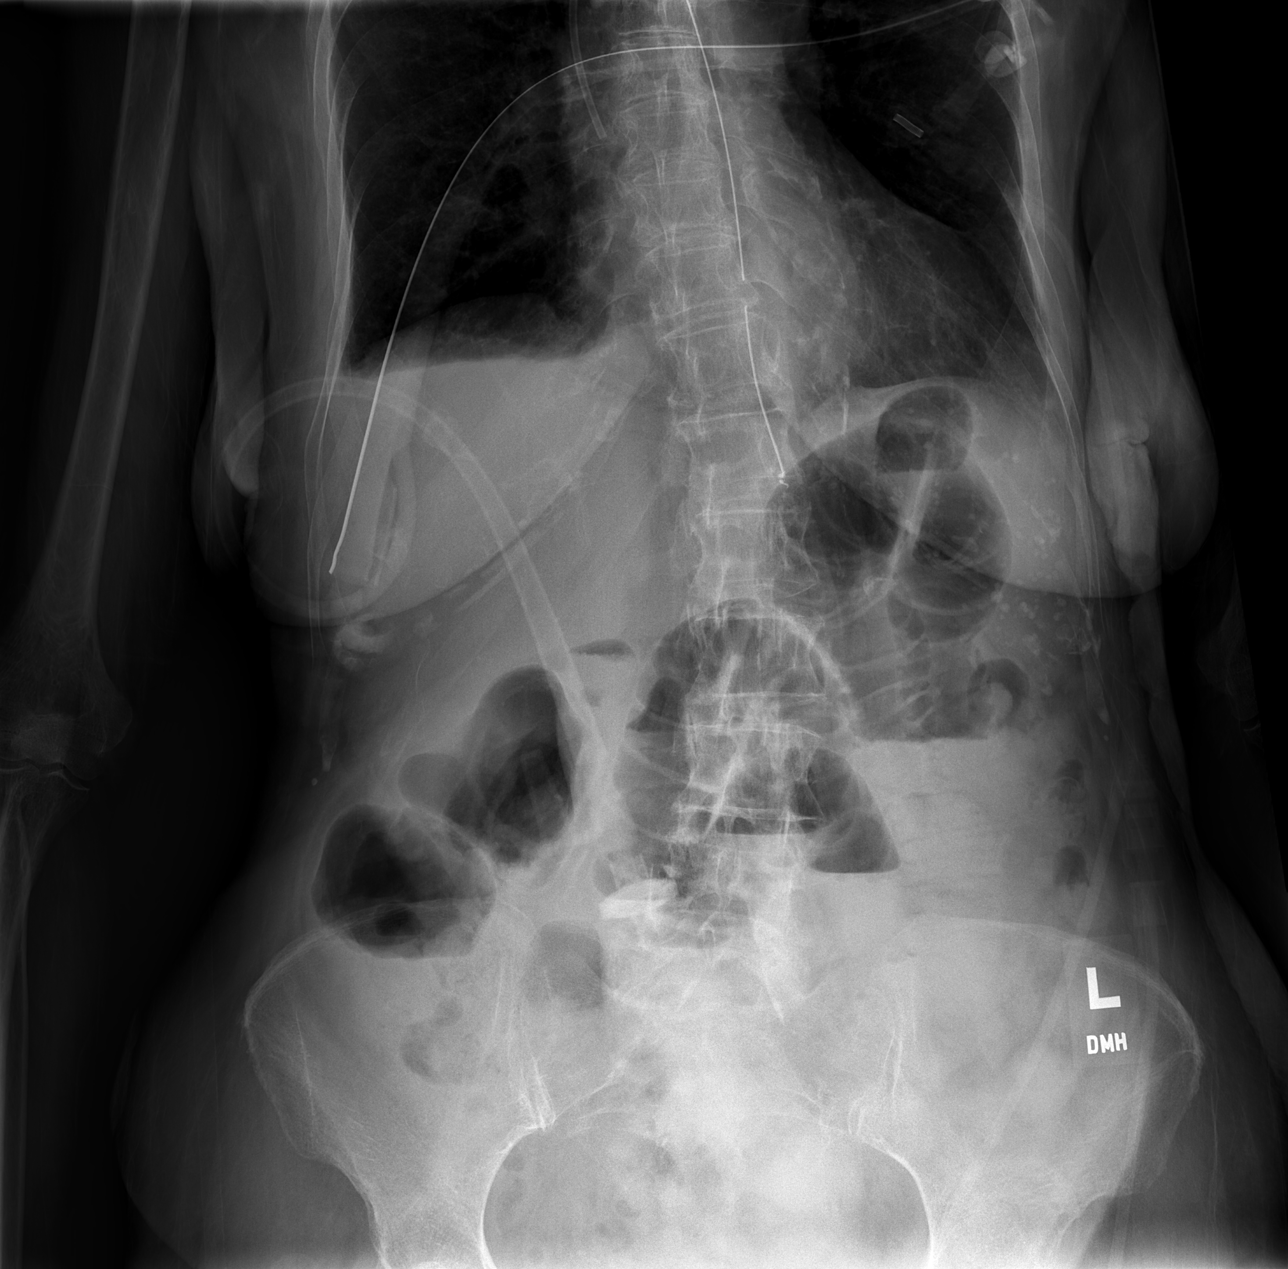

[t abdomen supine]
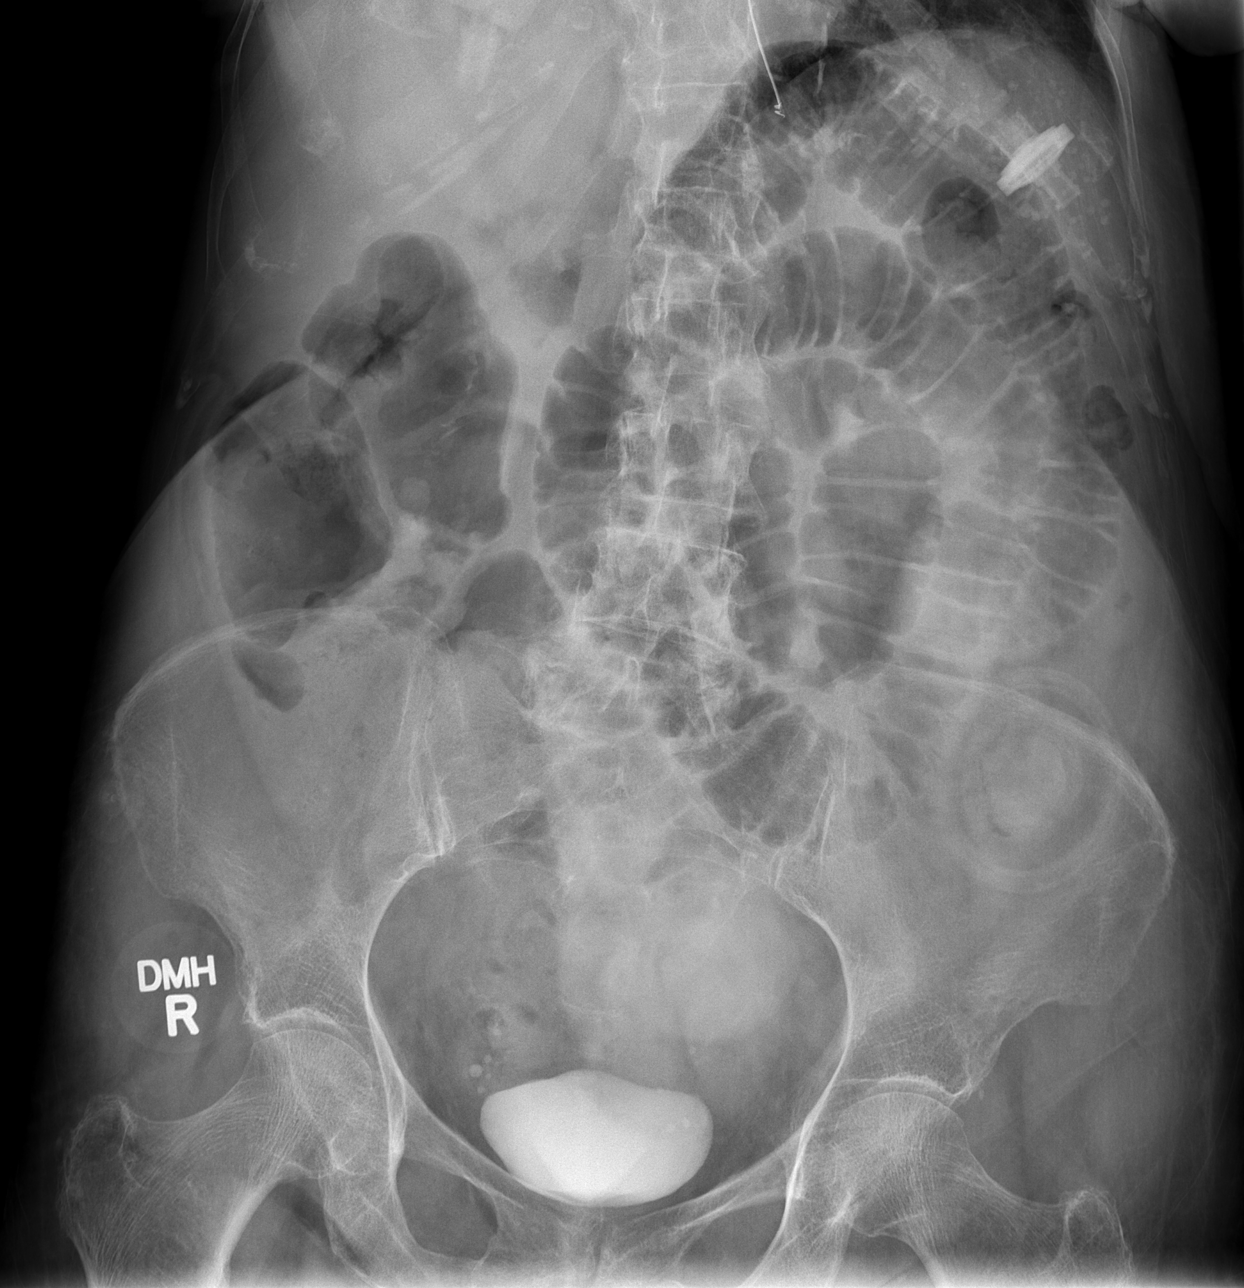

[3 of 3 positions shown; findings below may reference images not displayed]

FINDINGS: Left-sided Port-A-Cath terminates in proximal superior vena cava.
No pneumothorax is evident.  Lungs appear hyperinflated. The
cardiac silhouette is normal size and shape.  No pulmonary edema,
pneumonia, or pleural effusion is seen.

No pneumoperitoneum is evident.  Enteric tube tip terminates at the
GE junction

region.  Suggest advancing the enteric tube into the stomach.
Multiple dilated loops of small intestine are seen with air-fluid
levels on upright examination consistent with partial mechanical
small-bowel obstruction.  A small amount of colon gas is present.
Residual contrast from previous CT is seen in the bladder.  There
is osteopenic appearance of the bones with degenerative
spondylosis.  There is scoliosis convexity to the left. No opaque
calculus is evident.
IMPRESSION: The tip of the enteric tube is in the GE junction region.  Suggest
advancing the enteric tube into the stomach.  No pneumoperitoneum
evident. The lungs appear hyperinflated consistent with obstructive
pulmonary disease. Partial mechanical small-bowel obstruction
persists.

## 2011-10-18 ENCOUNTER — Telehealth: Payer: Self-pay | Admitting: Oncology

## 2012-01-02 ENCOUNTER — Other Ambulatory Visit: Payer: Self-pay | Admitting: Oncology

## 2012-01-02 DIAGNOSIS — C20 Malignant neoplasm of rectum: Secondary | ICD-10-CM

## 2012-01-03 ENCOUNTER — Telehealth: Payer: Self-pay | Admitting: Oncology

## 2012-01-03 ENCOUNTER — Ambulatory Visit (HOSPITAL_BASED_OUTPATIENT_CLINIC_OR_DEPARTMENT_OTHER): Payer: Medicare Other | Admitting: Oncology

## 2012-01-03 ENCOUNTER — Other Ambulatory Visit (HOSPITAL_BASED_OUTPATIENT_CLINIC_OR_DEPARTMENT_OTHER): Payer: Medicare Other | Admitting: Lab

## 2012-01-03 VITALS — BP 123/65 | HR 60 | Temp 98.4°F | Ht 63.5 in | Wt 140.7 lb

## 2012-01-03 DIAGNOSIS — N3289 Other specified disorders of bladder: Secondary | ICD-10-CM | POA: Diagnosis not present

## 2012-01-03 DIAGNOSIS — C20 Malignant neoplasm of rectum: Secondary | ICD-10-CM | POA: Diagnosis not present

## 2012-01-03 LAB — CBC WITH DIFFERENTIAL/PLATELET
Basophils Absolute: 0 10*3/uL (ref 0.0–0.1)
Eosinophils Absolute: 0.1 10*3/uL (ref 0.0–0.5)
HCT: 36 % (ref 34.8–46.6)
HGB: 12.2 g/dL (ref 11.6–15.9)
LYMPH%: 11.3 % — ABNORMAL LOW (ref 14.0–49.7)
MCHC: 33.8 g/dL (ref 31.5–36.0)
MONO#: 0.5 10*3/uL (ref 0.1–0.9)
NEUT%: 75.4 % (ref 38.4–76.8)
Platelets: 230 10*3/uL (ref 145–400)
WBC: 4.6 10*3/uL (ref 3.9–10.3)
lymph#: 0.5 10*3/uL — ABNORMAL LOW (ref 0.9–3.3)

## 2012-01-03 NOTE — Progress Notes (Signed)
Hematology and Oncology Follow Up Visit  Sherry [name] [id number] 11/11/1923 78 y.o. 01/03/2012 10:30 AM  [name], MD  Rob Bunting, MD  Manus Rudd, MD  Antony Blackbird, MD, PhD (Radiation Oncology)  Principle Diagnosis:  This is an 78 year old female diagnosed with a T3 N0 rectal adenocarcinoma by EUS criteria in September 2010.  She did have subsequently a T2 N1 stage III rectal cancer.    Prior Therapy:  1.  She received neoadjuvant therapy concomitantly with Xeloda, therapy concluded on October 31, 2010.  She received total 5040 cGy in 28 fractions. 2.  The patient received abdominoperineal resection done December 27, 2009.  She had a T2 N1 with 1 out of 9 lymph nodes involved. 3.  The patient received two cycles of adjuvant FOLFOX chemotherapy, last given was March 2011.  The patient discontinued chemotherapy due to her wishes and due to her intolerance with the small bowel obstruction that was recurrent.  Current therapy: Surveillance and watchful observation.  Interim History:  Sherry [name] presents today for an office follow-up visit.  Her daughter accompanies her.  This is a pleasant 78 year old female with stage III rectal cancer.  She has had neoadjuvant therapy followed by surgical resection, but again stopped chemotherapy prematurely due to poor tolerance.  She did have recurrent small bowel obstructions, that required multiple hospitalizations.  She is currently on active surveillance without any evidence to suggest any recurrent disease.  She has not reported any major complaints.  She does not report any nausea, vomiting, diarrhea, constipation.  She does not report any melena, hematochezia or hematuria, fevers, chills or night sweats.  She is eating and drinking quite well. She is reporting pelvic discomfort and associated bladder spasms. She did have a scan of the abdomin and  Pelvis in 06/2011 which showed a possible bladder stone, but no cancer replace.  Medications:  I have reviewed the patient's current medications. Current outpatient prescriptions:albuterol (VENTOLIN HFA) 108 (90 BASE) MCG/ACT inhaler, Inhale 2 puffs into the lungs 4 (four) times daily., Disp: 3 Inhaler, Rfl: 6;  atorvastatin (LIPITOR) 10 MG tablet, Take 1 tablet (10 mg total) by mouth daily., Disp: 90 tablet, Rfl: 2;  cyclobenzaprine (FLEXERIL) 5 MG tablet, Take 5 mg by mouth 3 (three) times daily as needed.  , Disp: , Rfl:  diltiazem (TIAZAC) 180 MG 24 hr capsule, Take 1 capsule (180 mg total) by mouth daily., Disp: 90 capsule, Rfl: 2;  levothyroxine (SYNTHROID) 75 MCG tablet, Take 1 tablet (75 mcg total) by mouth daily., Disp: 90 tablet, Rfl: 2;  metoprolol succinate (TOPROL XL) 25 MG 24 hr tablet, Take 1 tablet (25 mg total) by mouth daily. 1/2 daily, Disp: 90 tablet, Rfl: 2 peg 3350 electrolyte powder (MOVIPREP) 100 G SOLR, Take 1 kit (100 g total) by mouth as directed. See written handout, Disp: 1 kit, Rfl: 0;  traMADol-acetaminophen (ULTRACET) 37.5-325 MG per tablet, Take 1 tablet by mouth every 6 (six) hours as needed., Disp: 90 tablet, Rfl: 2 Current facility-administered medications:0.9 %  sodium chloride infusion, 500 mL, Intravenous, Continuous, Rob Bunting, MD  Allergies:  Allergies  Allergen Reactions  . Afluria Preservative Free   . Sulfamethoxazole W/Trimethoprim     Past Medical History, Surgical history, Social history, and Family History were reviewed and updated.  Review of Systems: Constitutional:  Negative for fever, chills, night sweats, anorexia, weight loss, pain. Cardiovascular: no chest pain or dyspnea on exertion Respiratory: no cough, shortness of breath, or wheezing Neurological: no TIA or stroke symptoms  Dermatological: negative ENT: negative Skin: Negative. Gastrointestinal: no abdominal pain, change in bowel habits, or black or bloody stools Genito-Urinary: positive for - change in urinary stream, dysuria and pelvic pain Hematological and Lymphatic:  negative Breast: negative Musculoskeletal: negative Remaining ROS negative. Physical Exam: Blood pressure 123/65, pulse 60, temperature 98.4 F (36.9 C), temperature source Oral, height 5' 3.5" (1.613 m), weight 140 lb 11.2 oz (63.821 kg). ECOG: 1 General appearance: alert Head: Normocephalic, without obvious abnormality, atraumatic Neck: no adenopathy, no carotid bruit, no JVD, supple, symmetrical, trachea midline and thyroid not enlarged, symmetric, no tenderness/mass/nodules Lymph nodes: Cervical, supraclavicular, and axillary nodes normal. Heart:regular rate and rhythm, S1, S2 normal, no murmur, click, rub or gallop Lung:chest clear, no wheezing, rales, normal symmetric air entry Abdomin: soft, non-tender, without masses or organomegaly EXT:no erythema, induration, or nodules   Lab Results: Lab Results  Component Value Date   WBC 4.6 01/03/2012   HGB 12.2 01/03/2012   HCT 36.0 01/03/2012   MCV 87.6 01/03/2012   PLT 230 01/03/2012     Chemistry      Component Value Date/Time   NA 139 02/21/2011 1328   NA 139 02/21/2011 1328   NA 139 02/21/2011 1328   K 4.1 02/21/2011 1328   K 4.1 02/21/2011 1328   K 4.1 02/21/2011 1328   CL 104 02/21/2011 1328   CL 104 02/21/2011 1328   CL 104 02/21/2011 1328   CO2 25 02/21/2011 1328   CO2 25 02/21/2011 1328   CO2 25 02/21/2011 1328   BUN 19 02/21/2011 1328   BUN 19 02/21/2011 1328   BUN 19 02/21/2011 1328   CREATININE 0.75 02/21/2011 1328   CREATININE 0.75 02/21/2011 1328   CREATININE 0.75 02/21/2011 1328      Component Value Date/Time   CALCIUM 10.8* 02/21/2011 1328   CALCIUM 10.8* 02/21/2011 1328   CALCIUM 10.8* 02/21/2011 1328   ALKPHOS 124* 02/21/2011 1328   ALKPHOS 124* 02/21/2011 1328   ALKPHOS 124* 02/21/2011 1328   AST 10 02/21/2011 1328   AST 10 02/21/2011 1328   AST 10 02/21/2011 1328   ALT 9 02/21/2011 1328   ALT 9 02/21/2011 1328   ALT 9 02/21/2011 1328   BILITOT 0.3 02/21/2011 1328   BILITOT 0.3 02/21/2011 1328   BILITOT 0.3 02/21/2011 1328         Impression and  Plan: This is an 78 year old female with the following issues; 1.  Stage III colorectal cancer, she is status post surgical resection in January 2011.  She did not tolerate the course of adjuvant chemotherapy.  She is currently on observation and surveillance.  Her last restaging CT scan was in December 201 and a repeat scan in 06/2011 showed no cancer recuurence. She is due for another CT scan which will arrange for today. 2.  Recurrent small bowel obstruction, that currently has resolved.  No new symptoms. 3.  Surveillance colonoscopies: She should be due in 2013 or 2014 4.  Follow up: 3 months 5.  Bladder spasms and possible stones: I will refer her to Urology for management.   Arrion Broaddus, MD 1/17/201310:30 AM

## 2012-01-03 NOTE — Telephone Encounter (Signed)
[facility] PT APPT SCHEDULE FOR April AND CT FOR 1/22. PT ALSO GIVEN APPT FOR 2/18 W/DR DAHLSTEDT (1ST AVAIL).

## 2012-01-04 LAB — COMPREHENSIVE METABOLIC PANEL
ALT: 8 U/L (ref 0–35)
BUN: 14 mg/dL (ref 6–23)
CO2: 27 mEq/L (ref 19–32)
Calcium: 10.1 mg/dL (ref 8.4–10.5)
Chloride: 107 mEq/L (ref 96–112)
Creatinine, Ser: 0.8 mg/dL (ref 0.50–1.10)
Glucose, Bld: 116 mg/dL — ABNORMAL HIGH (ref 70–99)
Total Bilirubin: 0.4 mg/dL (ref 0.3–1.2)

## 2012-01-04 LAB — CEA: CEA: 1.7 ng/mL (ref 0.0–5.0)

## 2012-01-08 ENCOUNTER — Ambulatory Visit (HOSPITAL_COMMUNITY)
Admission: RE | Admit: 2012-01-08 | Discharge: 2012-01-08 | Disposition: A | Discharge status: Home / Self Care | Payer: Medicare Other | Source: Ambulatory Visit | Attending: Oncology | Admitting: Oncology

## 2012-01-08 DIAGNOSIS — K469 Unspecified abdominal hernia without obstruction or gangrene: Secondary | ICD-10-CM | POA: Insufficient documentation

## 2012-01-08 DIAGNOSIS — E278 Other specified disorders of adrenal gland: Secondary | ICD-10-CM | POA: Insufficient documentation

## 2012-01-08 DIAGNOSIS — N8111 Cystocele, midline: Secondary | ICD-10-CM | POA: Insufficient documentation

## 2012-01-08 DIAGNOSIS — Z9089 Acquired absence of other organs: Secondary | ICD-10-CM | POA: Diagnosis not present

## 2012-01-08 DIAGNOSIS — M25419 Effusion, unspecified shoulder: Secondary | ICD-10-CM | POA: Diagnosis not present

## 2012-01-08 DIAGNOSIS — C189 Malignant neoplasm of colon, unspecified: Secondary | ICD-10-CM | POA: Insufficient documentation

## 2012-01-08 DIAGNOSIS — I719 Aortic aneurysm of unspecified site, without rupture: Secondary | ICD-10-CM | POA: Diagnosis not present

## 2012-01-08 DIAGNOSIS — Z933 Colostomy status: Secondary | ICD-10-CM | POA: Diagnosis not present

## 2012-01-08 DIAGNOSIS — K449 Diaphragmatic hernia without obstruction or gangrene: Secondary | ICD-10-CM | POA: Diagnosis not present

## 2012-01-08 DIAGNOSIS — C20 Malignant neoplasm of rectum: Secondary | ICD-10-CM

## 2012-01-08 MED ORDER — IOHEXOL 300 MG/ML  SOLN
100.0000 mL | Freq: Once | INTRAMUSCULAR | Status: AC | PRN
  Administered 2012-01-08: 100 mL via INTRAVENOUS

## 2012-01-24 ENCOUNTER — Ambulatory Visit (INDEPENDENT_AMBULATORY_CARE_PROVIDER_SITE_OTHER): Payer: Medicare Other | Admitting: Internal Medicine

## 2012-01-24 ENCOUNTER — Encounter: Payer: Self-pay | Admitting: Internal Medicine

## 2012-01-24 DIAGNOSIS — I10 Essential (primary) hypertension: Secondary | ICD-10-CM | POA: Diagnosis not present

## 2012-01-24 DIAGNOSIS — E785 Hyperlipidemia, unspecified: Secondary | ICD-10-CM | POA: Diagnosis not present

## 2012-01-24 DIAGNOSIS — J449 Chronic obstructive pulmonary disease, unspecified: Secondary | ICD-10-CM

## 2012-01-24 DIAGNOSIS — E039 Hypothyroidism, unspecified: Secondary | ICD-10-CM | POA: Diagnosis not present

## 2012-01-24 DIAGNOSIS — C20 Malignant neoplasm of rectum: Secondary | ICD-10-CM | POA: Diagnosis not present

## 2012-01-24 NOTE — Patient Instructions (Signed)
Limit your sodium (Salt) intake  Return in 6 months for follow-up    It is important that you exercise regularly, at least 20 minutes 3 to 4 times per week.  If you develop chest pain or shortness of breath seek  medical attention.  

## 2012-01-24 NOTE — Progress Notes (Signed)
  Subjective:    Patient ID: Sherry [name], female    DOB: [birth year], 78 y.o.   MRN: [id number]  HPI  an 78 year old patient who is seen today for followup. She's not been seen here in some time but is followed closely by oncology due to rectal cancer. She's had a recent CT abdominal scan. She is status post surgery and permanent colostomy. She has hypothyroidism untreated hypertension. She's on Lipitor for dyslipidemia. Doing remarkably well.    Review of Systems  Constitutional: Negative.   HENT: Negative for hearing loss, congestion, sore throat, rhinorrhea, dental problem, sinus pressure and tinnitus.   Eyes: Negative for pain, discharge and visual disturbance.  Respiratory: Negative for cough and shortness of breath.   Cardiovascular: Negative for chest pain, palpitations and leg swelling.  Gastrointestinal: Negative for nausea, vomiting, abdominal pain, diarrhea, constipation, blood in stool and abdominal distention.  Genitourinary: Negative for dysuria, urgency, frequency, hematuria, flank pain, vaginal bleeding, vaginal discharge, difficulty urinating, vaginal pain and pelvic pain.  Musculoskeletal: Negative for joint swelling, arthralgias and gait problem.  Skin: Negative for rash.  Neurological: Negative for dizziness, syncope, speech difficulty, weakness, numbness and headaches.  Hematological: Negative for adenopathy.  Psychiatric/Behavioral: Negative for behavioral problems, dysphoric mood and agitation. The patient is not nervous/anxious.        Objective:   Physical Exam  Constitutional: She is oriented to person, place, and time. She appears well-developed and well-nourished.  HENT:  Head: Normocephalic.  Right Ear: External ear normal.  Left Ear: External ear normal.  Mouth/Throat: Oropharynx is clear and moist.  Eyes: Conjunctivae and EOM are normal. Pupils are equal, round, and reactive to light.  Neck: Normal range of motion. Neck supple. No thyromegaly present.    Cardiovascular: Normal rate, regular rhythm, normal heart sounds and intact distal pulses.   Pulmonary/Chest: Effort normal and breath sounds normal.  Abdominal: Soft. Bowel sounds are normal. She exhibits no mass. There is no tenderness.       Colostomy present  Musculoskeletal: Normal range of motion.  Lymphadenopathy:    She has no cervical adenopathy.  Neurological: She is alert and oriented to person, place, and time.  Skin: Skin is warm and dry. No rash noted.  Psychiatric: She has a normal mood and affect. Her behavior is normal.          Assessment & Plan:   Hypertension well controlled Status post rectal cancer with colostomy Dyslipidemia Hypothyroidism  Patient is clinically stable has had recent lab and CT abdominal scan we'll see in 6 months for an annual exam

## 2012-02-04 DIAGNOSIS — R3915 Urgency of urination: Secondary | ICD-10-CM | POA: Diagnosis not present

## 2012-02-04 DIAGNOSIS — R350 Frequency of micturition: Secondary | ICD-10-CM | POA: Diagnosis not present

## 2012-02-04 DIAGNOSIS — N3941 Urge incontinence: Secondary | ICD-10-CM | POA: Diagnosis not present

## 2012-02-12 ENCOUNTER — Telehealth: Payer: Self-pay | Admitting: Internal Medicine

## 2012-02-12 MED ORDER — LEVOTHYROXINE SODIUM 75 MCG PO TABS: 75.0000 ug | ORAL_TABLET | Freq: Every day | ORAL | Status: DC

## 2012-02-12 MED ORDER — METOPROLOL SUCCINATE ER 25 MG PO TB24: 25.0000 mg | ORAL_TABLET | Freq: Every day | ORAL | Status: DC

## 2012-02-12 MED ORDER — DILTIAZEM HCL ER BEADS 180 MG PO CP24: 180.0000 mg | ORAL_CAPSULE | Freq: Every day | ORAL | Status: DC

## 2012-02-12 MED ORDER — ATORVASTATIN CALCIUM 10 MG PO TABS: 10.0000 mg | ORAL_TABLET | Freq: Every day | ORAL | Status: DC

## 2012-02-12 NOTE — Telephone Encounter (Signed)
Diltiazem, Synthroid,Lipitor & Toprol - pt needs refill on all these sent to [facility] fax # [phone]. Please call her when this has been done

## 2012-02-15 ENCOUNTER — Other Ambulatory Visit: Payer: Self-pay

## 2012-02-15 MED ORDER — ALBUTEROL SULFATE HFA 108 (90 BASE) MCG/ACT IN AERS: 2.0000 | INHALATION_SPRAY | Freq: Four times a day (QID) | RESPIRATORY_TRACT | Status: DC

## 2012-02-19 ENCOUNTER — Telehealth: Payer: Self-pay | Admitting: Family Medicine

## 2012-02-19 NOTE — Telephone Encounter (Signed)
This was taken care of yesterday - fax paper back KIK

## 2012-02-19 NOTE — Telephone Encounter (Signed)
Last time she filled her Toprol, they filled it for 1 tablet a day. But she only takes 1/2 tab per day. Per pharmacy at [facility] order, they've called 3 times with no results. They MUST have a NEW Rx stating she takes only 1/2 tab per day. Please send in. Thanks.

## 2012-04-03 ENCOUNTER — Other Ambulatory Visit (HOSPITAL_BASED_OUTPATIENT_CLINIC_OR_DEPARTMENT_OTHER): Payer: Medicare Other | Admitting: Lab

## 2012-04-03 ENCOUNTER — Ambulatory Visit (HOSPITAL_BASED_OUTPATIENT_CLINIC_OR_DEPARTMENT_OTHER): Payer: Medicare Other | Admitting: Oncology

## 2012-04-03 ENCOUNTER — Telehealth: Payer: Self-pay | Admitting: Oncology

## 2012-04-03 VITALS — BP 126/66 | HR 64 | Temp 97.5°F | Ht 63.5 in | Wt 140.0 lb

## 2012-04-03 DIAGNOSIS — C20 Malignant neoplasm of rectum: Secondary | ICD-10-CM

## 2012-04-03 LAB — CBC WITH DIFFERENTIAL/PLATELET
BASO%: 0.8 % (ref 0.0–2.0)
Basophils Absolute: 0 10*3/uL (ref 0.0–0.1)
EOS%: 3 % (ref 0.0–7.0)
HCT: 37.1 % (ref 34.8–46.6)
HGB: 12.4 g/dL (ref 11.6–15.9)
MONO#: 0.5 10*3/uL (ref 0.1–0.9)
NEUT%: 75 % (ref 38.4–76.8)
RDW: 15.2 % — ABNORMAL HIGH (ref 11.2–14.5)
WBC: 4.7 10*3/uL (ref 3.9–10.3)
lymph#: 0.5 10*3/uL — ABNORMAL LOW (ref 0.9–3.3)

## 2012-04-03 LAB — COMPREHENSIVE METABOLIC PANEL
AST: 14 U/L (ref 0–37)
Albumin: 4 g/dL (ref 3.5–5.2)
Alkaline Phosphatase: 126 U/L — ABNORMAL HIGH (ref 39–117)
BUN: 16 mg/dL (ref 6–23)
Calcium: 9.8 mg/dL (ref 8.4–10.5)
Chloride: 106 mEq/L (ref 96–112)
Glucose, Bld: 81 mg/dL (ref 70–99)
Potassium: 4.1 mEq/L (ref 3.5–5.3)
Sodium: 140 mEq/L (ref 135–145)
Total Protein: 6.4 g/dL (ref 6.0–8.3)

## 2012-04-03 NOTE — Progress Notes (Signed)
Hematology and Oncology Follow Up Visit  Sherry [name] [id number] [birth year] 78 y.o. 04/03/2012 9:55 AM  Sherry Chiquito, MD  Sherry Bunting, MD  Sherry Rudd, MD  Sherry Blackbird, MD, PhD (Radiation Oncology)  Principle Diagnosis:  This is an 78 year old female diagnosed with a T3 N0 rectal adenocarcinoma by EUS criteria in September 2010.  She did have subsequently a T2 N1 stage III rectal cancer.    Prior Therapy:  1.  She received neoadjuvant therapy concomitantly with Xeloda, therapy concluded on October 31, 2010.  She received total 5040 cGy in 28 fractions. 2.  The patient received abdominoperineal resection done December 27, 2009.  She had a T2 N1 with 1 out of 9 lymph nodes involved. 3.  The patient received two cycles of adjuvant FOLFOX chemotherapy, last given was March 2011.  The patient discontinued chemotherapy due to her wishes and due to her intolerance with the small bowel obstruction that was recurrent.  Current therapy: Surveillance and watchful observation.  Interim History:  Sherry [name] presents today for an office follow-up visit.  Her daughter accompanies her.  This is a pleasant 78 year old female with stage III rectal cancer.  She has had neoadjuvant therapy followed by surgical resection, but again stopped chemotherapy prematurely due to poor tolerance.  She did have recurrent small bowel obstructions, that required multiple hospitalizations.  She is currently on active surveillance without any evidence to suggest any recurrent disease.  She has not reported any major complaints.  She does not report any nausea, vomiting, diarrhea, constipation.  She does not report any melena, hematochezia or hematuria, fevers, chills or night sweats.  She is eating and drinking quite well. She is reporting pelvic discomfort and associated bladder spasms.  No new complaints at this time. No recent hospitalizations or illnesses.    Medications: I have reviewed the patient's current  medications. Current outpatient prescriptions:albuterol (VENTOLIN HFA) 108 (90 BASE) MCG/ACT inhaler, Inhale 2 puffs into the lungs 4 (four) times daily., Disp: 3 Inhaler, Rfl: 6;  atorvastatin (LIPITOR) 10 MG tablet, Take 1 tablet (10 mg total) by mouth daily., Disp: 90 tablet, Rfl: 3;  cyclobenzaprine (FLEXERIL) 5 MG tablet, Take 5 mg by mouth 3 (three) times daily as needed.  , Disp: , Rfl:  diltiazem (TIAZAC) 180 MG 24 hr capsule, Take 1 capsule (180 mg total) by mouth daily., Disp: 90 capsule, Rfl: 3;  levothyroxine (SYNTHROID) 75 MCG tablet, Take 1 tablet (75 mcg total) by mouth daily., Disp: 90 tablet, Rfl: 3;  metoprolol succinate (TOPROL XL) 25 MG 24 hr tablet, Take 1 tablet (25 mg total) by mouth daily. 1/2 daily, Disp: 90 tablet, Rfl: 2 peg 3350 electrolyte powder (MOVIPREP) 100 G SOLR, Take 1 kit (100 g total) by mouth as directed. See written handout, Disp: 1 kit, Rfl: 0;  traMADol-acetaminophen (ULTRACET) 37.5-325 MG per tablet, Take 1 tablet by mouth every 6 (six) hours as needed., Disp: 90 tablet, Rfl: 2 Current facility-administered medications:0.9 %  sodium chloride infusion, 500 mL, Intravenous, Continuous, Sherry Fee, MD  Allergies:  Allergies  Allergen Reactions  . Afluria Preservative Free   . Sulfamethoxazole W/Trimethoprim     Past Medical History, Surgical history, Social history, and Family History were reviewed and updated.  Review of Systems: Constitutional:  Negative for fever, chills, night sweats, anorexia, weight loss, pain. Cardiovascular: no chest pain or dyspnea on exertion Respiratory: no cough, shortness of breath, or wheezing Neurological: no TIA or stroke symptoms Dermatological: negative ENT: negative Skin: Negative. Gastrointestinal: no  abdominal pain, change in bowel habits, or black or bloody stools Genito-Urinary: positive for - change in urinary stream, dysuria and pelvic pain Hematological and Lymphatic: negative Breast:  negative Musculoskeletal: negative Remaining ROS negative. Physical Exam: Blood pressure 126/66, pulse 64, temperature 97.5 F (36.4 C), temperature source Oral, height 5' 3.5" (1.613 m), weight 140 lb (63.504 kg). ECOG: 1 General appearance: alert Head: Normocephalic, without obvious abnormality, atraumatic Neck: no adenopathy, no carotid bruit, no JVD, supple, symmetrical, trachea midline and thyroid not enlarged, symmetric, no tenderness/mass/nodules Lymph nodes: Cervical, supraclavicular, and axillary nodes normal. Heart:regular rate and rhythm, S1, S2 normal, no murmur, click, rub or gallop Lung:chest clear, no wheezing, rales, normal symmetric air entry Abdomin: soft, non-tender, without masses or organomegaly EXT:no erythema, induration, or nodules   Lab Results: Lab Results  Component Value Date   WBC 4.7 04/03/2012   HGB 12.4 04/03/2012   HCT 37.1 04/03/2012   MCV 89.1 04/03/2012   PLT 204 04/03/2012     Chemistry      Component Value Date/Time   NA 142 01/03/2012 0945   K 3.6 01/03/2012 0945   CL 107 01/03/2012 0945   CO2 27 01/03/2012 0945   BUN 14 01/03/2012 0945   CREATININE 0.80 01/03/2012 0945      Component Value Date/Time   CALCIUM 10.1 01/03/2012 0945   ALKPHOS 121* 01/03/2012 0945   AST 10 01/03/2012 0945   ALT 8 01/03/2012 0945   BILITOT 0.4 01/03/2012 0945         Impression and Plan: This is an 78 year old female with the following issues; 1.  Stage III colorectal cancer, she is status post surgical resection in January 2011.  She did not tolerate the course of adjuvant chemotherapy.  She is currently on observation and surveillance.  Her last restaging CT scan was in 1/ 2013 did not showed no cancer recuurence. She is due for another CT scan in 07/2012. 2.  Recurrent small bowel obstruction, that currently has resolved.  No new symptoms. 3.  Surveillance colonoscopies: She should be due in 2013 or 2014 4.  Follow up: 4 months    Sherry Espinoza,  MD 4/18/20139:55 AM

## 2012-04-03 NOTE — Telephone Encounter (Signed)
[facility] pt appt schedule for aug including ct scan.

## 2012-04-04 DIAGNOSIS — R3915 Urgency of urination: Secondary | ICD-10-CM | POA: Diagnosis not present

## 2012-04-04 DIAGNOSIS — R350 Frequency of micturition: Secondary | ICD-10-CM | POA: Diagnosis not present

## 2012-04-04 DIAGNOSIS — N3941 Urge incontinence: Secondary | ICD-10-CM | POA: Diagnosis not present

## 2012-04-16 DIAGNOSIS — M1710 Unilateral primary osteoarthritis, unspecified knee: Secondary | ICD-10-CM | POA: Diagnosis not present

## 2012-06-02 DIAGNOSIS — M1710 Unilateral primary osteoarthritis, unspecified knee: Secondary | ICD-10-CM | POA: Diagnosis not present

## 2012-06-10 DIAGNOSIS — M1710 Unilateral primary osteoarthritis, unspecified knee: Secondary | ICD-10-CM | POA: Diagnosis not present

## 2012-06-17 DIAGNOSIS — M1710 Unilateral primary osteoarthritis, unspecified knee: Secondary | ICD-10-CM | POA: Diagnosis not present

## 2012-06-24 DIAGNOSIS — M1710 Unilateral primary osteoarthritis, unspecified knee: Secondary | ICD-10-CM | POA: Diagnosis not present

## 2012-06-24 DIAGNOSIS — IMO0002 Reserved for concepts with insufficient information to code with codable children: Secondary | ICD-10-CM | POA: Diagnosis not present

## 2012-07-01 DIAGNOSIS — M1710 Unilateral primary osteoarthritis, unspecified knee: Secondary | ICD-10-CM | POA: Diagnosis not present

## 2012-07-29 DIAGNOSIS — M1710 Unilateral primary osteoarthritis, unspecified knee: Secondary | ICD-10-CM | POA: Diagnosis not present

## 2012-07-31 ENCOUNTER — Telehealth: Payer: Self-pay | Admitting: Oncology

## 2012-07-31 NOTE — Telephone Encounter (Signed)
Moved 8/22 to 9/25. S/w pt she is aware. Pt will keep lb/ct for 8/19.

## 2012-08-04 ENCOUNTER — Other Ambulatory Visit (HOSPITAL_BASED_OUTPATIENT_CLINIC_OR_DEPARTMENT_OTHER): Payer: Medicare Other | Admitting: Lab

## 2012-08-04 ENCOUNTER — Telehealth: Payer: Self-pay | Admitting: Oncology

## 2012-08-04 ENCOUNTER — Encounter (HOSPITAL_COMMUNITY): Payer: Self-pay

## 2012-08-04 ENCOUNTER — Ambulatory Visit (HOSPITAL_COMMUNITY)
Admission: RE | Admit: 2012-08-04 | Discharge: 2012-08-04 | Disposition: A | Discharge status: Home / Self Care | Payer: Medicare Other | Source: Ambulatory Visit | Attending: Oncology | Admitting: Oncology

## 2012-08-04 DIAGNOSIS — Z933 Colostomy status: Secondary | ICD-10-CM | POA: Diagnosis not present

## 2012-08-04 DIAGNOSIS — E279 Disorder of adrenal gland, unspecified: Secondary | ICD-10-CM | POA: Insufficient documentation

## 2012-08-04 DIAGNOSIS — M5137 Other intervertebral disc degeneration, lumbosacral region: Secondary | ICD-10-CM | POA: Diagnosis not present

## 2012-08-04 DIAGNOSIS — I77819 Aortic ectasia, unspecified site: Secondary | ICD-10-CM | POA: Insufficient documentation

## 2012-08-04 DIAGNOSIS — C189 Malignant neoplasm of colon, unspecified: Secondary | ICD-10-CM | POA: Insufficient documentation

## 2012-08-04 DIAGNOSIS — IMO0002 Reserved for concepts with insufficient information to code with codable children: Secondary | ICD-10-CM | POA: Insufficient documentation

## 2012-08-04 DIAGNOSIS — C20 Malignant neoplasm of rectum: Secondary | ICD-10-CM

## 2012-08-04 DIAGNOSIS — Z9089 Acquired absence of other organs: Secondary | ICD-10-CM | POA: Diagnosis not present

## 2012-08-04 DIAGNOSIS — N281 Cyst of kidney, acquired: Secondary | ICD-10-CM | POA: Insufficient documentation

## 2012-08-04 DIAGNOSIS — M51379 Other intervertebral disc degeneration, lumbosacral region without mention of lumbar back pain or lower extremity pain: Secondary | ICD-10-CM | POA: Insufficient documentation

## 2012-08-04 DIAGNOSIS — M4120 Other idiopathic scoliosis, site unspecified: Secondary | ICD-10-CM | POA: Diagnosis not present

## 2012-08-04 DIAGNOSIS — Z0389 Encounter for observation for other suspected diseases and conditions ruled out: Secondary | ICD-10-CM | POA: Diagnosis not present

## 2012-08-04 LAB — CBC WITH DIFFERENTIAL/PLATELET
Basophils Absolute: 0 10*3/uL (ref 0.0–0.1)
Eosinophils Absolute: 0.1 10*3/uL (ref 0.0–0.5)
HGB: 12.9 g/dL (ref 11.6–15.9)
MCV: 91.1 fL (ref 79.5–101.0)
MONO#: 0.7 10*3/uL (ref 0.1–0.9)
NEUT#: 4.6 10*3/uL (ref 1.5–6.5)
RBC: 4.36 10*6/uL (ref 3.70–5.45)
RDW: 14.4 % (ref 11.2–14.5)
WBC: 6 10*3/uL (ref 3.9–10.3)
lymph#: 0.6 10*3/uL — ABNORMAL LOW (ref 0.9–3.3)

## 2012-08-04 LAB — CMP (CANCER CENTER ONLY)
ALT(SGPT): 24 U/L (ref 10–47)
Albumin: 3.7 g/dL (ref 3.3–5.5)
CO2: 30 mEq/L (ref 18–33)
Calcium: 9.8 mg/dL (ref 8.0–10.3)
Chloride: 100 mEq/L (ref 98–108)
Glucose, Bld: 114 mg/dL (ref 73–118)
Potassium: 4.1 mEq/L (ref 3.3–4.7)
Sodium: 140 mEq/L (ref 128–145)
Total Bilirubin: 0.6 mg/dl (ref 0.20–1.60)
Total Protein: 6.9 g/dL (ref 6.4–8.1)

## 2012-08-04 LAB — CEA: CEA: 2.2 ng/mL (ref 0.0–5.0)

## 2012-08-04 MED ORDER — IOHEXOL 300 MG/ML  SOLN
100.0000 mL | Freq: Once | INTRAMUSCULAR | Status: AC | PRN
  Administered 2012-08-04: 100 mL via INTRAVENOUS

## 2012-08-04 NOTE — Telephone Encounter (Signed)
Pt came in and needed to r/s 9/25 appt to 9/26   aom

## 2012-08-07 ENCOUNTER — Ambulatory Visit: Payer: Medicare Other | Admitting: Oncology

## 2012-09-03 ENCOUNTER — Other Ambulatory Visit: Payer: Self-pay | Admitting: Internal Medicine

## 2012-09-03 MED ORDER — LEVOTHYROXINE SODIUM 75 MCG PO TABS: 75.0000 ug | ORAL_TABLET | Freq: Every day | ORAL | Status: DC

## 2012-09-03 NOTE — Telephone Encounter (Signed)
Pt can not received her generic synthroid from [facility]. Pt can rx into [facility] [place] [phone]

## 2012-09-10 ENCOUNTER — Ambulatory Visit: Payer: Medicare Other | Admitting: Oncology

## 2012-09-11 ENCOUNTER — Telehealth: Payer: Self-pay | Admitting: Oncology

## 2012-09-11 ENCOUNTER — Ambulatory Visit (HOSPITAL_BASED_OUTPATIENT_CLINIC_OR_DEPARTMENT_OTHER): Payer: Medicare Other | Admitting: Oncology

## 2012-09-11 VITALS — BP 117/69 | HR 64 | Temp 98.2°F | Resp 20 | Ht 63.5 in | Wt 139.3 lb

## 2012-09-11 DIAGNOSIS — R141 Gas pain: Secondary | ICD-10-CM | POA: Diagnosis not present

## 2012-09-11 DIAGNOSIS — R142 Eructation: Secondary | ICD-10-CM

## 2012-09-11 DIAGNOSIS — C20 Malignant neoplasm of rectum: Secondary | ICD-10-CM

## 2012-09-11 NOTE — Progress Notes (Signed)
Hematology and Oncology Follow Up Visit  Taylee Gunnells [id number] 11-Apr-1923 78 y.o. 09/11/2012 3:36 PM  [name], MD  Rob Bunting, MD  Manus Rudd, MD  Antony Blackbird, MD, PhD (Radiation Oncology)  Principle Diagnosis:  This is an 78 year old female diagnosed with a T3 N0 rectal adenocarcinoma by EUS criteria in September 2010.  She did have subsequently a T2 N1 stage III rectal cancer.    Prior Therapy:  1.  She received neoadjuvant therapy concomitantly with Xeloda, therapy concluded on October 31, 2010.  She received total 5040 cGy in 28 fractions. 2.  The patient received abdominoperineal resection done December 27, 2009.  She had a T2 N1 with 1 out of 9 lymph nodes involved. 3.  The patient received two cycles of adjuvant FOLFOX chemotherapy, last given was March 2011.  The patient discontinued chemotherapy due to her wishes and due to her intolerance with the small bowel obstruction that was recurrent.  Current therapy: Surveillance and watchful observation.  Interim History:  Mrs. Schuchard presents today for an office follow-up visit.  Her daughter accompanies her.  This is a pleasant 78 year old female with stage III rectal cancer.  She has had neoadjuvant therapy followed by surgical resection, but again stopped chemotherapy prematurely due to poor tolerance.  She did have recurrent small bowel obstructions, that required multiple hospitalizations.  She is currently on active surveillance without any evidence to suggest any recurrent disease.  She has not reported any major complaints.  She does not report any nausea, vomiting, diarrhea, constipation.  She does not report any melena, hematochezia or hematuria, fevers, chills or night sweats.  She is eating and drinking quite well. She is reporting pelvic discomfort and associated bladder spasms which has improved.  She reported a bulging mass around her colostomy. Some pain and constipations.    Medications: I have reviewed  the patient's current medications. Current outpatient prescriptions:albuterol (VENTOLIN HFA) 108 (90 BASE) MCG/ACT inhaler, Inhale 2 puffs into the lungs 4 (four) times daily., Disp: 3 Inhaler, Rfl: 6;  atorvastatin (LIPITOR) 10 MG tablet, Take 1 tablet (10 mg total) by mouth daily., Disp: 90 tablet, Rfl: 3;  cyclobenzaprine (FLEXERIL) 5 MG tablet, Take 5 mg by mouth 3 (three) times daily as needed.  , Disp: , Rfl:  diltiazem (TIAZAC) 180 MG 24 hr capsule, Take 1 capsule (180 mg total) by mouth daily., Disp: 90 capsule, Rfl: 3;  levothyroxine (SYNTHROID) 75 MCG tablet, Take 1 tablet (75 mcg total) by mouth daily., Disp: 90 tablet, Rfl: 1;  metoprolol succinate (TOPROL XL) 25 MG 24 hr tablet, Take 1 tablet (25 mg total) by mouth daily. 1/2 daily, Disp: 90 tablet, Rfl: 2 peg 3350 electrolyte powder (MOVIPREP) 100 G SOLR, Take 1 kit (100 g total) by mouth as directed. See written handout, Disp: 1 kit, Rfl: 0;  traMADol-acetaminophen (ULTRACET) 37.5-325 MG per tablet, Take 1 tablet by mouth every 6 (six) hours as needed., Disp: 90 tablet, Rfl: 2 Current facility-administered medications:0.9 %  sodium chloride infusion, 500 mL, Intravenous, Continuous, Rachael Fee, MD  Allergies:  Allergies  Allergen Reactions  . Influenza Virus Vacc Split Pf   . Sulfamethoxazole W-Trimethoprim     Past Medical History, Surgical history, Social history, and Family History were reviewed and updated.  Review of Systems: Constitutional:  Negative for fever, chills, night sweats, anorexia, weight loss, pain. Cardiovascular: no chest pain or dyspnea on exertion Respiratory: no cough, shortness of breath, or wheezing Neurological: no TIA or stroke symptoms Dermatological: negative  ENT: negative Skin: Negative. Gastrointestinal: no abdominal pain, change in bowel habits, or black or bloody stools Genito-Urinary: positive for - change in urinary stream, dysuria and pelvic pain Hematological and Lymphatic:  negative Breast: negative Musculoskeletal: negative Remaining ROS negative. Physical Exam: Blood pressure 117/69, pulse 64, temperature 98.2 F (36.8 C), temperature source Oral, resp. rate 20, height 5' 3.5" (1.613 m), weight 139 lb 4.8 oz (63.186 kg). ECOG: 1 General appearance: alert Head: Normocephalic, without obvious abnormality, atraumatic Neck: no adenopathy, no carotid bruit, no JVD, supple, symmetrical, trachea midline and thyroid not enlarged, symmetric, no tenderness/mass/nodules Lymph nodes: Cervical, supraclavicular, and axillary nodes normal. Heart:regular rate and rhythm, S1, S2 normal, no murmur, click, rub or gallop Lung:chest clear, no wheezing, rales, normal symmetric air entry Abdomin: soft, non-tender, without masses or organomegaly. Bulge noted around the colostomy.  EXT:no erythema, induration, or nodules   Lab Results: Lab Results  Component Value Date   WBC 6.0 08/04/2012   HGB 12.9 08/04/2012   HCT 39.7 08/04/2012   MCV 91.1 08/04/2012   PLT 228 08/04/2012     Chemistry      Component Value Date/Time   NA 140 08/04/2012 0949   NA 140 04/03/2012 0919   K 4.1 08/04/2012 0949   K 4.1 04/03/2012 0919   CL 100 08/04/2012 0949   CL 106 04/03/2012 0919   CO2 30 08/04/2012 0949   CO2 29 04/03/2012 0919   BUN 16 08/04/2012 0949   BUN 16 04/03/2012 0919   CREATININE 0.8 08/04/2012 0949   CREATININE 0.84 04/03/2012 0919      Component Value Date/Time   CALCIUM 9.8 08/04/2012 0949   CALCIUM 9.8 04/03/2012 0919   ALKPHOS 131* 08/04/2012 0949   ALKPHOS 126* 04/03/2012 0919   AST 20 08/04/2012 0949   AST 14 04/03/2012 0919   ALT 11 04/03/2012 0919   BILITOT 0.60 08/04/2012 0949   BILITOT 0.4 04/03/2012 0919         Impression and Plan: This is an 78 year old female with the following issues; 1.  Stage III colorectal cancer, she is status post surgical resection in January 2011.  She did not tolerate the course of adjuvant chemotherapy.  She is currently on observation  and surveillance.  Her last restaging CT scan on 07/2012 discussed today and did not showed no cancer recurence. . 2.  Recurrent small bowel obstruction, that currently has resolved.  No new symptoms. 3.  Surveillance colonoscopies: She should be due in 2013 or 2014. 4. Abdominal distention and bulge: possible abdominal hernia. I will refer to Dr. [name].  5.  Follow up: 6 months    Marico Buckle, MD 9/26/20133:36 PM

## 2012-09-11 NOTE — Telephone Encounter (Signed)
Gave pt appt for October 2013 with [facility], Dr. [name] and see md with labs in March 2014

## 2012-09-11 NOTE — Addendum Note (Signed)
Addended by: Sherre Poot on: 09/11/2012 03:48 PM   Modules accepted: Orders

## 2012-09-26 ENCOUNTER — Encounter (INDEPENDENT_AMBULATORY_CARE_PROVIDER_SITE_OTHER): Payer: Self-pay | Admitting: Surgery

## 2012-09-26 ENCOUNTER — Ambulatory Visit (INDEPENDENT_AMBULATORY_CARE_PROVIDER_SITE_OTHER): Payer: Medicare Other | Admitting: Surgery

## 2012-09-26 VITALS — BP 148/62 | HR 64 | Temp 98.0°F | Resp 16 | Ht 63.0 in | Wt 135.2 lb

## 2012-09-26 DIAGNOSIS — K469 Unspecified abdominal hernia without obstruction or gangrene: Secondary | ICD-10-CM | POA: Diagnosis not present

## 2012-09-26 DIAGNOSIS — K435 Parastomal hernia without obstruction or  gangrene: Secondary | ICD-10-CM

## 2012-09-26 NOTE — Progress Notes (Signed)
Patient ID: Sherry [name], female   DOB: 1923/06/02, 78 y.o.   MRN: [id number]  Chief Complaint  Patient presents with  . Pre-op Exam    eval abd distention    HPI Sherry [name] is a 78 y.o. female.  Referred by Dr. Clelia Croft for evaluation of swelling around colostomy HPI [name] who is status post abdominoperineal resection on 12/27/09. She had a T2 N1 stage III rectal cancer. She was unable to tolerate a full course of chemotherapy. She is currently being managed with surveillance only. She has been doing reasonably well. She has been able to put on some weight and her appetite and activity level are good. Over the last several months she's developed some swelling around her colostomy. The colostomy continues to function well. She has had long-standing problems with constipation but she has a regimen that allows her to have bowel movements daily through her colostomy. She also feels that she has a golf ball where her rectum used to be. This is mostly noticeable when she is standing up. She did undergo a CT scan earlier this year which showed a parastomal hernia.She also has a pelvic floor enterocele. She presents now to discuss surgical evaluation of these problems.  Past Medical History  Diagnosis Date  . CAD (coronary artery disease)   . Hyperlipidemia   . Hypertension   . Anemia   . COPD (chronic obstructive pulmonary disease)   . Headache   . Hypothyroidism   . Osteoporosis   . Shingles   . Arthritis   . Tendon adhesions     torn tendon right shoulder  . Rectal adenocarcinoma dx'd 08/2009  . Rectal cancer     Past Surgical History  Procedure Date  . Appendectomy   . Cholecystectomy   . Vaginal hysterectomy   . Cardiac catheterization   . Rectum removal     1/11 DR Patience Nuzzo  . Colon removal     8 " 12/2009 DR Jermany Rimel  . Colon surgery     apr for rectal cancer 2011    Family History  Problem Relation Age of Onset  . Heart failure Father   . Cancer Mother      HEAD AND NECK  . Heart failure Sister   . Heart failure Brother   . Colon cancer Brother   . Colon cancer Brother   . Cervical cancer      Social History History  Substance Use Topics  . Smoking status: Former Games developer  . Smokeless tobacco: Not on file  . Alcohol Use: No    Allergies  Allergen Reactions  . Influenza Virus Vacc Split Pf   . Sulfamethoxazole W-Trimethoprim     Current Outpatient Prescriptions  Medication Sig Dispense Refill  . albuterol (VENTOLIN HFA) 108 (90 BASE) MCG/ACT inhaler Inhale 2 puffs into the lungs 4 (four) times daily.  3 Inhaler  6  . atorvastatin (LIPITOR) 10 MG tablet Take 1 tablet (10 mg total) by mouth daily.  90 tablet  3  . cyclobenzaprine (FLEXERIL) 5 MG tablet Take 5 mg by mouth 3 (three) times daily as needed.        . diltiazem (TIAZAC) 180 MG 24 hr capsule Take 1 capsule (180 mg total) by mouth daily.  90 capsule  3  . levothyroxine (SYNTHROID) 75 MCG tablet Take 1 tablet (75 mcg total) by mouth daily.  90 tablet  1  . metoprolol succinate (TOPROL XL) 25 MG 24 hr tablet  Take 1 tablet (25 mg total) by mouth daily. 1/2 daily  90 tablet  2  . peg 3350 electrolyte powder (MOVIPREP) 100 G SOLR Take 1 kit (100 g total) by mouth as directed. See written handout  1 kit  0  . traMADol-acetaminophen (ULTRACET) 37.5-325 MG per tablet Take 1 tablet by mouth every 6 (six) hours as needed.  90 tablet  2    Review of Systems Review of Systems  Constitutional: Negative for fever, chills and unexpected weight change.  HENT: Negative for hearing loss, congestion, sore throat, trouble swallowing and voice change.   Eyes: Negative for visual disturbance.  Respiratory: Negative for cough and wheezing.   Cardiovascular: Positive for leg swelling. Negative for chest pain and palpitations.  Gastrointestinal: Positive for constipation. Negative for nausea, vomiting, abdominal pain, diarrhea, blood in stool, abdominal distention and anal bleeding.    Genitourinary: Negative for hematuria, vaginal bleeding and difficulty urinating.  Musculoskeletal: Negative for arthralgias.  Skin: Negative for rash and wound.  Neurological: Negative for seizures, syncope and headaches.  Hematological: Negative for adenopathy. Does not bruise/bleed easily.  Psychiatric/Behavioral: Negative for confusion.    Blood pressure 148/62, pulse 64, temperature 98 F (36.7 C), temperature source Temporal, resp. rate 16, height 5\' 3"  (1.6 m), weight 135 lb 3.2 oz (61.326 kg).  Physical Exam Physical Exam Elderly female in no apparent stress HEENT-EOMI, sclera anicteric Neck-no masses, no thyromegaly Lungs-clear auscultation bilaterally CV-regular rate and rhythm, no murmurs Abdomen-soft nontender with well-healed lower midline incision. The left lower quadrant colostomy is viable and functioning. With Valsalva maneuver there is a noticeable peristomal hernia. This spontaneously reduces when the patient is relaxed. Perineum-the peritoneal incision is completely healed. No sign of infection. With Valsalva maneuver, there is a fairly noticeable herniation of bowel into the pelvis. This spontaneously reduces when she is relaxed.  Data Reviewed CT Chest/ Abd/ Pelvis 8/13     *RADIOLOGY REPORT*  Clinical Data: Evaluate colon cancer  CT CHEST, ABDOMEN AND PELVIS WITH CONTRAST  Technique: Multidetector CT imaging of the chest, abdomen and  pelvis was performed following the standard protocol during bolus  administration of intravenous contrast.  Contrast: OMNIPAQUE IOHEXOL 300 MG/ML SOLN  Comparison: 01/08/2012  CT CHEST  Findings: No enlarged axillary or supraclavicular adenopathy.  There is no mediastinal or hilar adenopathy noted. No pericardial  or pleural effusion identified. Calcified right paratracheal lymph  node is noted compatible with prior granulomatous disease.  Mild to moderate changes of emphysema identified. There is a right  upper lobe  calcified granuloma, image 20. No suspicious pulmonary  nodule or mass noted.  Mild scoliosis and degenerative disc disease affects the thoracic  spine. There are no worrisome lytic or sclerotic bone lesions.  IMPRESSION:  1. No acute findings identified within the chest. No evidence for  metastatic disease.  2. Prior granulomatous disease.  CT ABDOMEN AND PELVIS  Findings:  Calcified granulomas identified within the liver parenchyma. There  are no suspicious liver abnormalities noted. Prior  cholecystectomy. Mild increased caliber of the common bile duct  which measures 0.6 cm, image 60. Stable from previous exam.  The pancreas is unremarkable. Multiple calcified granulomas noted  throughout the splenic parenchyma.  The left adrenal gland nodule measures 8.8 x 14.1 mm, image 58.  Stable from previous exam. Right adrenal gland nodule measures  8.19 x 11.30 mm, image 55. Also unchanged from previous exam.  Bilateral renal cysts are not significantly changed from previous  exam. The urinary bladder  appears within normal limits.  Ectasia of the suprarenal abdominal aorta measures 3 cm, image 65.  Previously this measured the same. No significant adenopathy  within the upper abdomen.  There is no pelvic or inguinal adenopathy. There is no free fluid  or fluid collections within the upper abdomen or the pelvis.  The stomach is normal. The small bowel loops have a normal  caliber. Pelvic floor relaxation again demonstrated with a  cystocele and enterocele, image 108. The proximal colon appears  normal. There is a left lower quadrant colostomy. Parastomal  hernia of colon is identified.  Stable osteoporotic changes and degenerative disc disease noted  within the lumbar spine. No aggressive lytic or sclerotic bone  lesions noted.  IMPRESSION:  1. No acute findings identified. No findings for recurrent or  metastatic disease.  Original Report Authenticated By: Rosealee Albee, M.D. (  08/04/2012 12:57:25 )     Assessment    1.  Parastomal hernia  2.  Pelvic floor enterocele     Plan    Recommend laparoscopic repair of the parastomal hernia, likely with mesh.  We also discussed surgical repair of the enterocele, but the patient states that this doesn't bother her much and she doesn't want to have it fixed at this time.  The surgical procedure has been discussed with the patient.  Potential risks, benefits, alternative treatments, and expected outcomes have been explained.  All of the patient's questions at this time have been answered.  The likelihood of reaching the patient's treatment goal is good.  The patient understand the proposed surgical procedure and wishes to proceed.        Athziry Millican K. 09/26/2012, 12:28 PM

## 2012-10-17 ENCOUNTER — Encounter (HOSPITAL_COMMUNITY): Payer: Self-pay | Admitting: Pharmacy Technician

## 2012-10-23 ENCOUNTER — Ambulatory Visit (HOSPITAL_COMMUNITY)
Admission: RE | Admit: 2012-10-23 | Discharge: 2012-10-23 | Disposition: A | Discharge status: Home / Self Care | Payer: Medicare Other | Source: Ambulatory Visit | Attending: Anesthesiology | Admitting: Anesthesiology

## 2012-10-23 ENCOUNTER — Encounter (HOSPITAL_COMMUNITY)
Admission: RE | Admit: 2012-10-23 | Discharge: 2012-10-23 | Disposition: A | Discharge status: Home / Self Care | Payer: Medicare Other | Source: Ambulatory Visit | Attending: Surgery | Admitting: Surgery

## 2012-10-23 ENCOUNTER — Encounter (HOSPITAL_COMMUNITY): Payer: Self-pay

## 2012-10-23 DIAGNOSIS — R0602 Shortness of breath: Secondary | ICD-10-CM | POA: Insufficient documentation

## 2012-10-23 DIAGNOSIS — I10 Essential (primary) hypertension: Secondary | ICD-10-CM | POA: Diagnosis not present

## 2012-10-23 HISTORY — DX: Shortness of breath: R06.02

## 2012-10-23 HISTORY — DX: Gastro-esophageal reflux disease without esophagitis: K21.9

## 2012-10-23 HISTORY — DX: Personal history of other medical treatment: Z92.89

## 2012-10-23 LAB — BASIC METABOLIC PANEL
BUN: 16 mg/dL (ref 6–23)
Calcium: 10.6 mg/dL — ABNORMAL HIGH (ref 8.4–10.5)
GFR calc Af Amer: 87 mL/min — ABNORMAL LOW (ref >90)
GFR calc non Af Amer: 75 mL/min — ABNORMAL LOW (ref >90)
Glucose, Bld: 98 mg/dL (ref 70–99)
Sodium: 140 mEq/L (ref 135–145)

## 2012-10-23 LAB — CBC
MCH: 29.3 pg (ref 26.0–34.0)
MCHC: 32.1 g/dL (ref 30.0–36.0)
Platelets: 233 10*3/uL (ref 150–400)

## 2012-10-23 LAB — SURGICAL PCR SCREEN: MRSA, PCR: NEGATIVE

## 2012-10-23 NOTE — Pre-Procedure Instructions (Addendum)
20 Sherry [name]  10/23/2012   Your procedure is scheduled on:  Thursday, November 14th.  Report to [name] at 5:30 AM.  Call this number if you have problems the morning of surgery: [phone]   Remember:   Do not eat food or drink any liquid:After Midnight.                  Take these medicines the morning of surgery with A SIP OF WATER: Diltiazem (Tiazac), Levothyroxine (Synthyroid).  May take Ultracet if needed.   Do not wear jewelry, make-up or nail polish.  Do not wear lotions, powders, or perfumes. You may wear deodorant.  Do not shave 48 hours prior to surgery. Men may shave face and neck.  Do not bring valuables to the hospital.  Contacts, dentures or bridgework may not be worn into surgery.  Leave suitcase in the car. After surgery it may be brought to your room.  For patients admitted to the hospital, checkout time is 11:00 AM the day of discharge.   Patients discharged the day of surgery will not be allowed to drive home.  Name and phone number of your driver: _______________  Special Instructions: Shower using CHG 2 nights before surgery and the night before surgery.  If you shower the day of surgery use CHG.  Use special wash - you have one bottle of CHG for all showers.  You should use approximately 1/3 of the bottle for each shower.   Please read over the following fact sheets that you were given: Pain Booklet, Coughing and Deep Breathing and Surgical Site Infection Prevention

## 2012-10-29 MED ORDER — CEFAZOLIN SODIUM-DEXTROSE 2-3 GM-% IV SOLR
2.0000 g | INTRAVENOUS | Status: AC
  Administered 2012-10-30: 2 g via INTRAVENOUS
  Filled 2012-10-29: qty 50

## 2012-10-29 NOTE — H&P (Signed)
Patient presents with   .  Pre-op Exam       eval abd distention        HPI Sherry [name] is a 78 y.o. female.  Referred by Dr. Clelia Croft for evaluation of swelling around colostomy HPI This is a 62 rolled female who is status post abdominoperineal resection on 12/27/09. She had a T2 N1 stage III rectal cancer. She was unable to tolerate a full course of chemotherapy. She is currently being managed with surveillance only. She has been doing reasonably well. She has been able to put on some weight and her appetite and activity level are good. Over the last several months she's developed some swelling around her colostomy. The colostomy continues to function well. She has had long-standing problems with constipation but she has a regimen that allows her to have bowel movements daily through her colostomy. She also feels that she has a golf ball where her rectum used to be. This is mostly noticeable when she is standing up. She did undergo a CT scan earlier this year which showed a parastomal hernia.She also has a pelvic floor enterocele. She presents now to discuss surgical evaluation of these problems.    Past Medical History   Diagnosis  Date   .  CAD (coronary artery disease)     .  Hyperlipidemia     .  Hypertension     .  Anemia     .  COPD (chronic obstructive pulmonary disease)     .  Headache     .  Hypothyroidism     .  Osteoporosis     .  Shingles     .  Arthritis     .  Tendon adhesions         torn tendon right shoulder   .  Rectal adenocarcinoma  dx'd 08/2009   .  Rectal cancer           Past Surgical History   Procedure  Date   .  Appendectomy     .  Cholecystectomy     .  Vaginal hysterectomy     .  Cardiac catheterization     .  Rectum removal         1/11 DR Kinneth Fujiwara   .  Colon removal         8 " 12/2009 DR Shaguana Love   .  Colon surgery         apr for rectal cancer 2011         Family History   Problem  Relation  Age of Onset   .  Heart failure  Father     .   Cancer  Mother         HEAD AND NECK   .  Heart failure  Sister     .  Heart failure  Brother     .  Colon cancer  Brother     .  Colon cancer  Brother     .  Cervical cancer            Social History History   Substance Use Topics   .  Smoking status:  Former Games developer   .  Smokeless tobacco:  Not on file   .  Alcohol Use:  No         Allergies   Allergen  Reactions   .  Influenza Virus Vacc Split Pf     .  Sulfamethoxazole W-Trimethoprim  Current Outpatient Prescriptions   Medication  Sig  Dispense  Refill   .  albuterol (VENTOLIN HFA) 108 (90 BASE) MCG/ACT inhaler  Inhale 2 puffs into the lungs 4 (four) times daily.   3 Inhaler   6   .  atorvastatin (LIPITOR) 10 MG tablet  Take 1 tablet (10 mg total) by mouth daily.   90 tablet   3   .  cyclobenzaprine (FLEXERIL) 5 MG tablet  Take 5 mg by mouth 3 (three) times daily as needed.           .  diltiazem (TIAZAC) 180 MG 24 hr capsule  Take 1 capsule (180 mg total) by mouth daily.   90 capsule   3   .  levothyroxine (SYNTHROID) 75 MCG tablet  Take 1 tablet (75 mcg total) by mouth daily.   90 tablet   1   .  metoprolol succinate (TOPROL XL) 25 MG 24 hr tablet  Take 1 tablet (25 mg total) by mouth daily. 1/2 daily   90 tablet   2   .  peg 3350 electrolyte powder (MOVIPREP) 100 G SOLR  Take 1 kit (100 g total) by mouth as directed. See written handout   1 kit   0   .  traMADol-acetaminophen (ULTRACET) 37.5-325 MG per tablet  Take 1 tablet by mouth every 6 (six) hours as needed.   90 tablet   2        Review of Systems Review of Systems  Constitutional: Negative for fever, chills and unexpected weight change.  HENT: Negative for hearing loss, congestion, sore throat, trouble swallowing and voice change.   Eyes: Negative for visual disturbance.  Respiratory: Negative for cough and wheezing.   Cardiovascular: Positive for leg swelling. Negative for chest pain and palpitations.  Gastrointestinal: Positive for  constipation. Negative for nausea, vomiting, abdominal pain, diarrhea, blood in stool, abdominal distention and anal bleeding.  Genitourinary: Negative for hematuria, vaginal bleeding and difficulty urinating.  Musculoskeletal: Negative for arthralgias.  Skin: Negative for rash and wound.  Neurological: Negative for seizures, syncope and headaches.  Hematological: Negative for adenopathy. Does not bruise/bleed easily.  Psychiatric/Behavioral: Negative for confusion.      Blood pressure 148/62, pulse 64, temperature 98 F (36.7 C), temperature source Temporal, resp. rate 16, height 5\' 3"  (1.6 m), weight 135 lb 3.2 oz (61.326 kg).   Physical Exam Physical Exam Elderly female in no apparent stress HEENT-EOMI, sclera anicteric Neck-no masses, no thyromegaly Lungs-clear auscultation bilaterally CV-regular rate and rhythm, no murmurs Abdomen-soft nontender with well-healed lower midline incision. The left lower quadrant colostomy is viable and functioning. With Valsalva maneuver there is a noticeable peristomal hernia. This spontaneously reduces when the patient is relaxed. Perineum-the peritoneal incision is completely healed. No sign of infection. With Valsalva maneuver, there is a fairly noticeable herniation of bowel into the pelvis. This spontaneously reduces when she is relaxed.   Data Reviewed CT Chest/ Abd/ Pelvis 8/13          *RADIOLOGY REPORT*    Clinical Data: Evaluate colon cancer   CT CHEST, ABDOMEN AND PELVIS WITH CONTRAST   Technique: Multidetector CT imaging of the chest, abdomen and   pelvis was performed following the standard protocol during bolus   administration of intravenous contrast.   Contrast: OMNIPAQUE IOHEXOL 300 MG/ML SOLN   Comparison: 01/08/2012   CT CHEST   Findings: No enlarged axillary or supraclavicular adenopathy.   There is no mediastinal or hilar adenopathy noted.  No pericardial   or pleural effusion identified. Calcified right  paratracheal lymph   node is noted compatible with prior granulomatous disease.   Mild to moderate changes of emphysema identified. There is a right   upper lobe calcified granuloma, image 20. No suspicious pulmonary   nodule or mass noted.   Mild scoliosis and degenerative disc disease affects the thoracic   spine. There are no worrisome lytic or sclerotic bone lesions.   IMPRESSION:   1. No acute findings identified within the chest. No evidence for   metastatic disease.   2. Prior granulomatous disease.   CT ABDOMEN AND PELVIS   Findings:   Calcified granulomas identified within the liver parenchyma. There   are no suspicious liver abnormalities noted. Prior   cholecystectomy. Mild increased caliber of the common bile duct   which measures 0.6 cm, image 60. Stable from previous exam.   The pancreas is unremarkable. Multiple calcified granulomas noted   throughout the splenic parenchyma.   The left adrenal gland nodule measures 8.8 x 14.1 mm, image 58.   Stable from previous exam. Right adrenal gland nodule measures   8.19 x 11.30 mm, image 55. Also unchanged from previous exam.   Bilateral renal cysts are not significantly changed from previous   exam. The urinary bladder appears within normal limits.   Ectasia of the suprarenal abdominal aorta measures 3 cm, image 65.   Previously this measured the same. No significant adenopathy   within the upper abdomen.   There is no pelvic or inguinal adenopathy. There is no free fluid   or fluid collections within the upper abdomen or the pelvis.   The stomach is normal. The small bowel loops have a normal   caliber. Pelvic floor relaxation again demonstrated with a   cystocele and enterocele, image 108. The proximal colon appears   normal. There is a left lower quadrant colostomy. Parastomal   hernia of colon is identified.   Stable osteoporotic changes and degenerative disc disease noted   within the lumbar spine. No aggressive lytic or  sclerotic bone   lesions noted.   IMPRESSION:   1. No acute findings identified. No findings for recurrent or   metastatic disease.   Original Report Authenticated By: Rosealee Albee, M.D. ( 08/04/2012 12:57:25 )          Assessment    1.  Parastomal hernia   2.  Pelvic floor enterocele      Plan    Recommend laparoscopic repair of the parastomal hernia, likely with mesh.  We also discussed surgical repair of the enterocele, but the patient states that this doesn't bother her much and she doesn't want to have it fixed at this time.  The surgical procedure has been discussed with the patient.  Potential risks, benefits, alternative treatments, and expected outcomes have been explained.  All of the patient's questions at this time have been answered.  The likelihood of reaching the patient's treatment goal is good.  The patient understand the proposed surgical procedure and wishes to proceed.        Wilmon Arms. [name], MD, [facility]  10/29/2012 10:37 PM

## 2012-10-30 ENCOUNTER — Ambulatory Visit (HOSPITAL_COMMUNITY): Payer: Medicare Other | Admitting: Anesthesiology

## 2012-10-30 ENCOUNTER — Inpatient Hospital Stay (HOSPITAL_COMMUNITY)
Admission: RE | Admit: 2012-10-30 | Discharge: 2012-11-05 | DRG: 348 | Disposition: A | Discharge status: Home / Self Care | Payer: Medicare Other | Source: Ambulatory Visit | Attending: Surgery | Admitting: Surgery

## 2012-10-30 ENCOUNTER — Encounter (HOSPITAL_COMMUNITY)
Admission: RE | Disposition: A | Discharge status: Home / Self Care | Payer: Self-pay | Source: Ambulatory Visit | Attending: Surgery

## 2012-10-30 ENCOUNTER — Encounter (HOSPITAL_COMMUNITY): Payer: Self-pay | Admitting: *Deleted

## 2012-10-30 ENCOUNTER — Encounter (HOSPITAL_COMMUNITY): Payer: Self-pay | Admitting: Anesthesiology

## 2012-10-30 DIAGNOSIS — Z79899 Other long term (current) drug therapy: Secondary | ICD-10-CM

## 2012-10-30 DIAGNOSIS — Z87891 Personal history of nicotine dependence: Secondary | ICD-10-CM | POA: Diagnosis not present

## 2012-10-30 DIAGNOSIS — IMO0002 Reserved for concepts with insufficient information to code with codable children: Principal | ICD-10-CM | POA: Diagnosis present

## 2012-10-30 DIAGNOSIS — E039 Hypothyroidism, unspecified: Secondary | ICD-10-CM | POA: Diagnosis present

## 2012-10-30 DIAGNOSIS — Y834 Other reconstructive surgery as the cause of abnormal reaction of the patient, or of later complication, without mention of misadventure at the time of the procedure: Secondary | ICD-10-CM | POA: Diagnosis not present

## 2012-10-30 DIAGNOSIS — K5730 Diverticulosis of large intestine without perforation or abscess without bleeding: Secondary | ICD-10-CM | POA: Diagnosis not present

## 2012-10-30 DIAGNOSIS — Z9221 Personal history of antineoplastic chemotherapy: Secondary | ICD-10-CM

## 2012-10-30 DIAGNOSIS — Z85048 Personal history of other malignant neoplasm of rectum, rectosigmoid junction, and anus: Secondary | ICD-10-CM

## 2012-10-30 DIAGNOSIS — M810 Age-related osteoporosis without current pathological fracture: Secondary | ICD-10-CM | POA: Diagnosis present

## 2012-10-30 DIAGNOSIS — I2510 Atherosclerotic heart disease of native coronary artery without angina pectoris: Secondary | ICD-10-CM | POA: Diagnosis present

## 2012-10-30 DIAGNOSIS — K929 Disease of digestive system, unspecified: Secondary | ICD-10-CM | POA: Diagnosis not present

## 2012-10-30 DIAGNOSIS — I10 Essential (primary) hypertension: Secondary | ICD-10-CM | POA: Diagnosis present

## 2012-10-30 DIAGNOSIS — Y9210 Unspecified residential institution as the place of occurrence of the external cause: Secondary | ICD-10-CM | POA: Diagnosis not present

## 2012-10-30 DIAGNOSIS — J449 Chronic obstructive pulmonary disease, unspecified: Secondary | ICD-10-CM | POA: Diagnosis present

## 2012-10-30 DIAGNOSIS — Z9889 Other specified postprocedural states: Secondary | ICD-10-CM | POA: Diagnosis not present

## 2012-10-30 DIAGNOSIS — E785 Hyperlipidemia, unspecified: Secondary | ICD-10-CM | POA: Diagnosis present

## 2012-10-30 DIAGNOSIS — N815 Vaginal enterocele: Secondary | ICD-10-CM | POA: Diagnosis present

## 2012-10-30 DIAGNOSIS — K435 Parastomal hernia without obstruction or  gangrene: Secondary | ICD-10-CM

## 2012-10-30 DIAGNOSIS — K560 Paralytic ileus: Secondary | ICD-10-CM | POA: Diagnosis not present

## 2012-10-30 DIAGNOSIS — J4489 Other specified chronic obstructive pulmonary disease: Secondary | ICD-10-CM | POA: Diagnosis present

## 2012-10-30 DIAGNOSIS — K458 Other specified abdominal hernia without obstruction or gangrene: Secondary | ICD-10-CM | POA: Diagnosis not present

## 2012-10-30 HISTORY — PX: VENTRAL HERNIA REPAIR: SHX424

## 2012-10-30 HISTORY — PX: PARASTOMAL HERNIA REPAIR: SHX2162

## 2012-10-30 SURGERY — REPAIR, HERNIA, VENTRAL, LAPAROSCOPIC
Anesthesia: General | Site: Abdomen | Wound class: Clean

## 2012-10-30 MED ORDER — 0.9 % SODIUM CHLORIDE (POUR BTL) OPTIME
TOPICAL | Status: DC | PRN
  Administered 2012-10-30: 1000 mL

## 2012-10-30 MED ORDER — LEVOTHYROXINE SODIUM 75 MCG PO TABS
75.0000 ug | ORAL_TABLET | Freq: Every day | ORAL | Status: DC
  Administered 2012-10-31 – 2012-11-05 (×6): 75 ug via ORAL
  Filled 2012-10-30 (×7): qty 1

## 2012-10-30 MED ORDER — ONDANSETRON HCL 4 MG/2ML IJ SOLN
4.0000 mg | Freq: Four times a day (QID) | INTRAMUSCULAR | Status: DC | PRN
  Administered 2012-10-30 – 2012-11-01 (×4): 4 mg via INTRAVENOUS
  Filled 2012-10-30 (×5): qty 2

## 2012-10-30 MED ORDER — HYDROMORPHONE HCL PF 1 MG/ML IJ SOLN
INTRAMUSCULAR | Status: AC
  Filled 2012-10-30: qty 1

## 2012-10-30 MED ORDER — ALBUTEROL SULFATE HFA 108 (90 BASE) MCG/ACT IN AERS
2.0000 | INHALATION_SPRAY | RESPIRATORY_TRACT | Status: DC | PRN
  Filled 2012-10-30: qty 6.7

## 2012-10-30 MED ORDER — KCL IN DEXTROSE-NACL 20-5-0.45 MEQ/L-%-% IV SOLN
INTRAVENOUS | Status: DC
  Administered 2012-10-30 – 2012-11-02 (×6): via INTRAVENOUS
  Administered 2012-11-02: 1000 mL via INTRAVENOUS
  Administered 2012-11-03 – 2012-11-04 (×2): via INTRAVENOUS
  Filled 2012-10-30 (×11): qty 1000

## 2012-10-30 MED ORDER — OXYCODONE-ACETAMINOPHEN 5-325 MG PO TABS
1.0000 | ORAL_TABLET | ORAL | Status: DC | PRN
  Administered 2012-10-31 – 2012-11-01 (×2): 1 via ORAL
  Filled 2012-10-30: qty 1
  Filled 2012-10-30: qty 2
  Filled 2012-10-30: qty 1

## 2012-10-30 MED ORDER — ATORVASTATIN CALCIUM 10 MG PO TABS
10.0000 mg | ORAL_TABLET | Freq: Every day | ORAL | Status: DC
  Administered 2012-10-30 – 2012-11-04 (×6): 10 mg via ORAL
  Filled 2012-10-30 (×7): qty 1

## 2012-10-30 MED ORDER — OXYBUTYNIN CHLORIDE ER 10 MG PO TB24
10.0000 mg | ORAL_TABLET | Freq: Every day | ORAL | Status: DC
  Administered 2012-10-31 – 2012-11-05 (×6): 10 mg via ORAL
  Filled 2012-10-30 (×6): qty 1

## 2012-10-30 MED ORDER — DEXTROSE 5 % IV SOLN
INTRAVENOUS | Status: DC | PRN
  Administered 2012-10-30: 08:00:00 via INTRAVENOUS

## 2012-10-30 MED ORDER — CEFAZOLIN SODIUM 1-5 GM-% IV SOLN
1.0000 g | Freq: Four times a day (QID) | INTRAVENOUS | Status: AC
  Administered 2012-10-30 – 2012-10-31 (×3): 1 g via INTRAVENOUS
  Filled 2012-10-30 (×4): qty 50

## 2012-10-30 MED ORDER — BUPIVACAINE-EPINEPHRINE 0.25% -1:200000 IJ SOLN
INTRAMUSCULAR | Status: DC | PRN
  Administered 2012-10-30: 20 mL

## 2012-10-30 MED ORDER — DILTIAZEM HCL ER BEADS 180 MG PO CP24
180.0000 mg | ORAL_CAPSULE | Freq: Every day | ORAL | Status: DC
Start: 2012-10-30 — End: 2012-10-30

## 2012-10-30 MED ORDER — BUPIVACAINE-EPINEPHRINE PF 0.25-1:200000 % IJ SOLN
INTRAMUSCULAR | Status: AC
  Filled 2012-10-30: qty 30

## 2012-10-30 MED ORDER — FENTANYL CITRATE 0.05 MG/ML IJ SOLN
INTRAMUSCULAR | Status: DC | PRN
  Administered 2012-10-30 (×2): 25 ug via INTRAVENOUS
  Administered 2012-10-30: 100 ug via INTRAVENOUS

## 2012-10-30 MED ORDER — CHLORHEXIDINE GLUCONATE 4 % EX LIQD: 1.0000 "application " | Freq: Once | CUTANEOUS | Status: DC

## 2012-10-30 MED ORDER — EPHEDRINE SULFATE 50 MG/ML IJ SOLN
INTRAMUSCULAR | Status: DC | PRN
  Administered 2012-10-30: 2.5 mg via INTRAVENOUS

## 2012-10-30 MED ORDER — HYDROMORPHONE HCL PF 1 MG/ML IJ SOLN
0.2500 mg | INTRAMUSCULAR | Status: DC | PRN
  Administered 2012-10-30 (×2): 0.5 mg via INTRAVENOUS

## 2012-10-30 MED ORDER — MORPHINE SULFATE 2 MG/ML IJ SOLN
2.0000 mg | INTRAMUSCULAR | Status: DC | PRN
  Administered 2012-10-30 – 2012-11-01 (×7): 2 mg via INTRAVENOUS
  Administered 2012-11-02: 4 mg via INTRAVENOUS
  Administered 2012-11-02: 2 mg via INTRAVENOUS
  Administered 2012-11-02: 4 mg via INTRAVENOUS
  Administered 2012-11-03 (×2): 2 mg via INTRAVENOUS
  Filled 2012-10-30 (×3): qty 1
  Filled 2012-10-30: qty 2
  Filled 2012-10-30 (×2): qty 1
  Filled 2012-10-30: qty 2
  Filled 2012-10-30 (×5): qty 1

## 2012-10-30 MED ORDER — METOPROLOL SUCCINATE 12.5 MG HALF TABLET
12.5000 mg | ORAL_TABLET | Freq: Every day | ORAL | Status: DC
  Administered 2012-10-30 – 2012-11-04 (×5): 12.5 mg via ORAL
  Filled 2012-10-30 (×7): qty 1

## 2012-10-30 MED ORDER — LACTATED RINGERS IV SOLN
INTRAVENOUS | Status: DC | PRN
  Administered 2012-10-30 (×2): via INTRAVENOUS

## 2012-10-30 MED ORDER — ARTIFICIAL TEARS OP OINT
TOPICAL_OINTMENT | OPHTHALMIC | Status: DC | PRN
  Administered 2012-10-30: 1 via OPHTHALMIC

## 2012-10-30 MED ORDER — MIDAZOLAM HCL 5 MG/5ML IJ SOLN
INTRAMUSCULAR | Status: DC | PRN
  Administered 2012-10-30 (×2): 0.5 mg via INTRAVENOUS

## 2012-10-30 MED ORDER — NEOSTIGMINE METHYLSULFATE 1 MG/ML IJ SOLN
INTRAMUSCULAR | Status: DC | PRN
  Administered 2012-10-30: 5 mg via INTRAVENOUS

## 2012-10-30 MED ORDER — ONDANSETRON HCL 4 MG PO TABS: 4.0000 mg | ORAL_TABLET | Freq: Four times a day (QID) | ORAL | Status: DC | PRN

## 2012-10-30 MED ORDER — GLYCOPYRROLATE 0.2 MG/ML IJ SOLN
INTRAMUSCULAR | Status: DC | PRN
  Administered 2012-10-30: .8 mg via INTRAVENOUS

## 2012-10-30 MED ORDER — KETOROLAC TROMETHAMINE 15 MG/ML IJ SOLN
15.0000 mg | Freq: Four times a day (QID) | INTRAMUSCULAR | Status: AC
  Administered 2012-10-30 – 2012-11-04 (×19): 15 mg via INTRAVENOUS
  Filled 2012-10-30 (×26): qty 1

## 2012-10-30 MED ORDER — ETOMIDATE 2 MG/ML IV SOLN
INTRAVENOUS | Status: DC | PRN
  Administered 2012-10-30: 12 mg via INTRAVENOUS

## 2012-10-30 MED ORDER — KCL IN DEXTROSE-NACL 20-5-0.45 MEQ/L-%-% IV SOLN
INTRAVENOUS | Status: AC
  Filled 2012-10-30: qty 1000

## 2012-10-30 MED ORDER — SODIUM CHLORIDE 0.9 % IR SOLN
Status: DC | PRN
  Administered 2012-10-30: 1000 mL

## 2012-10-30 MED ORDER — ONDANSETRON HCL 4 MG/2ML IJ SOLN
INTRAMUSCULAR | Status: DC | PRN
  Administered 2012-10-30: 4 mg via INTRAVENOUS

## 2012-10-30 MED ORDER — DILTIAZEM HCL ER COATED BEADS 180 MG PO CP24
180.0000 mg | ORAL_CAPSULE | Freq: Every day | ORAL | Status: DC
  Administered 2012-10-30 – 2012-11-05 (×7): 180 mg via ORAL
  Filled 2012-10-30 (×7): qty 1

## 2012-10-30 MED ORDER — ROCURONIUM BROMIDE 100 MG/10ML IV SOLN
INTRAVENOUS | Status: DC | PRN
  Administered 2012-10-30 (×3): 10 mg via INTRAVENOUS
  Administered 2012-10-30: 40 mg via INTRAVENOUS

## 2012-10-30 MED ORDER — ENOXAPARIN SODIUM 40 MG/0.4ML ~~LOC~~ SOLN
40.0000 mg | SUBCUTANEOUS | Status: DC
  Administered 2012-10-31 – 2012-11-04 (×5): 40 mg via SUBCUTANEOUS
  Filled 2012-10-30 (×7): qty 0.4

## 2012-10-30 MED ORDER — LIDOCAINE HCL (CARDIAC) 20 MG/ML IV SOLN
INTRAVENOUS | Status: DC | PRN
  Administered 2012-10-30: 70 mg via INTRAVENOUS

## 2012-10-30 SURGICAL SUPPLY — 66 items
APPLIER CLIP 5 13 M/L LIGAMAX5 (MISCELLANEOUS) ×3
APPLIER CLIP LOGIC TI 5 (MISCELLANEOUS) IMPLANT
APPLIER CLIP ROT 10 11.4 M/L (STAPLE)
BENZOIN TINCTURE PRP APPL 2/3 (GAUZE/BANDAGES/DRESSINGS) IMPLANT
BINDER ABD UNIV 12 45-62 (WOUND CARE) IMPLANT
BINDER ABDOMINAL 46IN 62IN (WOUND CARE)
BLADE SURG ROTATE 9660 (MISCELLANEOUS) ×3 IMPLANT
CANISTER SUCTION 2500CC (MISCELLANEOUS) ×3 IMPLANT
CHLORAPREP W/TINT 26ML (MISCELLANEOUS) ×3 IMPLANT
CLIP APPLIE 5 13 M/L LIGAMAX5 (MISCELLANEOUS) ×2 IMPLANT
CLIP APPLIE ROT 10 11.4 M/L (STAPLE) IMPLANT
CLOTH BEACON ORANGE TIMEOUT ST (SAFETY) ×3 IMPLANT
COVER SURGICAL LIGHT HANDLE (MISCELLANEOUS) ×3 IMPLANT
DECANTER SPIKE VIAL GLASS SM (MISCELLANEOUS) IMPLANT
DERMABOND ADHESIVE PROPEN (GAUZE/BANDAGES/DRESSINGS) ×2
DERMABOND ADVANCED .7 DNX6 (GAUZE/BANDAGES/DRESSINGS) ×4 IMPLANT
DEVICE SECURE STRAP 25 ABSORB (INSTRUMENTS) ×6 IMPLANT
DEVICE TROCAR PUNCTURE CLOSURE (ENDOMECHANICALS) ×6 IMPLANT
DRAPE LAPAROSCOPIC ABDOMINAL (DRAPES) ×3 IMPLANT
DRAPE UTILITY 15X26 W/TAPE STR (DRAPE) ×6 IMPLANT
DRSG OPSITE 4X5.5 SM (GAUZE/BANDAGES/DRESSINGS) ×3 IMPLANT
DRSG TEGADERM 4X4.75 (GAUZE/BANDAGES/DRESSINGS) ×3 IMPLANT
ELECT CAUTERY BLADE 6.4 (BLADE) IMPLANT
ELECT REM PT RETURN 9FT ADLT (ELECTROSURGICAL) ×3
ELECTRODE REM PT RTRN 9FT ADLT (ELECTROSURGICAL) ×2 IMPLANT
FILTER SMOKE EVAC LAPAROSHD (FILTER) IMPLANT
GLOVE BIO SURGEON STRL SZ7 (GLOVE) ×3 IMPLANT
GLOVE BIOGEL PI IND STRL 7.0 (GLOVE) ×8 IMPLANT
GLOVE BIOGEL PI IND STRL 7.5 (GLOVE) ×4 IMPLANT
GLOVE BIOGEL PI INDICATOR 7.0 (GLOVE) ×4
GLOVE BIOGEL PI INDICATOR 7.5 (GLOVE) ×2
GLOVE ECLIPSE 6.5 STRL STRAW (GLOVE) ×3 IMPLANT
GLOVE SURG SS PI 7.5 STRL IVOR (GLOVE) ×6 IMPLANT
GOWN PREVENTION PLUS XLARGE (GOWN DISPOSABLE) ×3 IMPLANT
GOWN STRL NON-REIN LRG LVL3 (GOWN DISPOSABLE) ×12 IMPLANT
KIT BASIN OR (CUSTOM PROCEDURE TRAY) ×3 IMPLANT
KIT COLOSTOMY ILEOSTOMY 2.75 (WOUND CARE) ×3 IMPLANT
KIT ROOM TURNOVER OR (KITS) ×3 IMPLANT
MESH PHYSIO OVAL 15X20CM (Mesh General) ×3 IMPLANT
NEEDLE HYPO 25GX1X1/2 BEV (NEEDLE) ×3 IMPLANT
NEEDLE SPNL 22GX3.5 QUINCKE BK (NEEDLE) ×3 IMPLANT
NS IRRIG 1000ML POUR BTL (IV SOLUTION) ×3 IMPLANT
PACK GENERAL/GYN (CUSTOM PROCEDURE TRAY) ×3 IMPLANT
PAD ARMBOARD 7.5X6 YLW CONV (MISCELLANEOUS) ×6 IMPLANT
PEN SKIN MARKING BROAD (MISCELLANEOUS) IMPLANT
SCALPEL HARMONIC ACE (MISCELLANEOUS) IMPLANT
SCISSORS LAP 5X35 DISP (ENDOMECHANICALS) ×3 IMPLANT
SET IRRIG TUBING LAPAROSCOPIC (IRRIGATION / IRRIGATOR) ×3 IMPLANT
SLEEVE ENDOPATH XCEL 5M (ENDOMECHANICALS) ×3 IMPLANT
SPONGE GAUZE 4X4 12PLY (GAUZE/BANDAGES/DRESSINGS) ×3 IMPLANT
STAPLER VISISTAT 35W (STAPLE) IMPLANT
STRIP CLOSURE SKIN 1/2X4 (GAUZE/BANDAGES/DRESSINGS) IMPLANT
SUT MNCRL AB 4-0 PS2 18 (SUTURE) ×3 IMPLANT
SUT NOVA NAB GS-21 0 18 T12 DT (SUTURE) ×9 IMPLANT
SUT NOVA NAB GS-21 1 T12 (SUTURE) IMPLANT
SUT VIC AB 3-0 SH 27 (SUTURE)
SUT VIC AB 3-0 SH 27XBRD (SUTURE) IMPLANT
SYR CONTROL 10ML LL (SYRINGE) ×3 IMPLANT
TOWEL OR 17X24 6PK STRL BLUE (TOWEL DISPOSABLE) ×3 IMPLANT
TOWEL OR 17X26 10 PK STRL BLUE (TOWEL DISPOSABLE) ×3 IMPLANT
TRAY FOLEY CATH 14FRSI W/METER (CATHETERS) ×3 IMPLANT
TRAY LAPAROSCOPIC (CUSTOM PROCEDURE TRAY) ×3 IMPLANT
TROCAR XCEL BLUNT TIP 100MML (ENDOMECHANICALS) IMPLANT
TROCAR XCEL NON-BLD 11X100MML (ENDOMECHANICALS) ×3 IMPLANT
TROCAR XCEL NON-BLD 5MMX100MML (ENDOMECHANICALS) ×3 IMPLANT
WATER STERILE IRR 1000ML POUR (IV SOLUTION) IMPLANT

## 2012-10-30 NOTE — Anesthesia Procedure Notes (Signed)
Procedure Name: Intubation Date/Time: 10/30/2012 7:38 AM Performed by: Julianne Rice K Pre-anesthesia Checklist: Emergency Drugs available, Patient identified, Suction available, Patient being monitored and Timeout performed Patient Re-evaluated:Patient Re-evaluated prior to inductionOxygen Delivery Method: Circle system utilized Preoxygenation: Pre-oxygenation with 100% oxygen Intubation Type: IV induction Ventilation: Mask ventilation without difficulty Laryngoscope Size: Mac and 3 Grade View: Grade I Tube type: Oral Tube size: 7.5 mm Number of attempts: 1 Airway Equipment and Method: Stylet Placement Confirmation: ETT inserted through vocal cords under direct vision,  breath sounds checked- equal and bilateral and positive ETCO2 Secured at: 23 cm Tube secured with: Tape Dental Injury: Teeth and Oropharynx as per pre-operative assessment

## 2012-10-30 NOTE — Progress Notes (Signed)
Patient ID: Sherry [name], female   DOB: [birth year], 78 y.o.   MRN: [id number] Patient is awake, alert Complaining of pain around her colostomy site Incisions c/d/i Filed Vitals:   10/30/12 1142  BP: 128/53  Pulse: 58  Temp: 97.4 F (36.3 C)  Resp: 18    Pain is due to the fascial closure of the abdominal wall around the colostomy.   Spoke with nurse - patient has only had Toradol so far; will use PRN morphine and Percocet to control her pain Foley out in AM  Clay K. [name], MD, [facility]  10/30/2012 4:57 PM

## 2012-10-30 NOTE — Brief Op Note (Signed)
10/30/2012  10:03 AM  PATIENT:  Sherry [name]  78 y.o. female  PRE-OPERATIVE DIAGNOSIS:  parastomal hernia  POST-OPERATIVE DIAGNOSIS:  parastomal hernia  PROCEDURE:  Procedure(s) (LRB) with comments: LAPAROSCOPIC VENTRAL HERNIA (N/A) - Laparoscopic repair of parastomal hernia With mesh SURGEON:  Surgeon(s) and Role:    Wilmon Arms. [name], MD - Primary    * Lodema Pilot, DO - Assisting  PHYSICIAN ASSISTANT:   ASSISTANTS: Dr. Biagio Quint   ANESTHESIA:   general  EBL:  Total I/O In: 1050 [I.V.:1050] Out: 450 [Urine:450]  BLOOD ADMINISTERED:none  DRAINS: Urinary Catheter (Foley)   LOCAL MEDICATIONS USED:  MARCAINE     SPECIMEN:  No Specimen  DISPOSITION OF SPECIMEN:  N/A  COUNTS:  YES  TOURNIQUET:  * No tourniquets in log *  DICTATION: .Dragon Dictation  PLAN OF CARE: Admit to inpatient   PATIENT DISPOSITION:  PACU - hemodynamically stable.   Delay start of Pharmacological VTE agent (>24hrs) due to surgical blood loss or risk of bleeding: no  Wilmon Arms. [name], MD, [facility]  10/30/2012 10:04 AM

## 2012-10-30 NOTE — Transfer of Care (Signed)
Immediate Anesthesia Transfer of Care Note  Patient: Sherry [name]  Procedure(s) Performed: Procedure(s) (LRB) with comments: LAPAROSCOPIC VENTRAL HERNIA (N/A) - Laparoscopic repair of parastomal hernia  Patient Location: PACU  Anesthesia Type:General  Level of Consciousness: awake, alert  and oriented  Airway & Oxygen Therapy: Patient Spontanous Breathing and Patient connected to nasal cannula oxygen  Post-op Assessment: Report given to PACU RN, Post -op Vital signs reviewed and stable and Patient moving all extremities  Post vital signs: Reviewed and stable  Complications: No apparent anesthesia complications

## 2012-10-30 NOTE — Anesthesia Preprocedure Evaluation (Addendum)
Anesthesia Evaluation  Patient identified by MRN, date of birth, ID band Patient awake    Reviewed: Allergy & Precautions, H&P , NPO status , Patient's Chart, lab work & pertinent test results, reviewed documented beta blocker date and time   Airway Mallampati: II      Dental   Pulmonary shortness of breath and with exertion, COPDformer smoker,  breath sounds clear to auscultation        Cardiovascular hypertension, Pt. on medications and Pt. on home beta blockers + CAD Rhythm:Regular Rate:Normal     Neuro/Psych  Headaches,  Neuromuscular disease negative psych ROS   GI/Hepatic Neg liver ROS, GERD-  Controlled,  Endo/Other  Hypothyroidism   Renal/GU negative Renal ROS     Musculoskeletal negative musculoskeletal ROS (+)   Abdominal   Peds  Hematology negative hematology ROS (+)   Anesthesia Other Findings   Reproductive/Obstetrics                         Anesthesia Physical Anesthesia Plan  ASA: III  Anesthesia Plan: General   Post-op Pain Management:    Induction: Intravenous  Airway Management Planned: Oral ETT  Additional Equipment:   Intra-op Plan:   Post-operative Plan: Extubation in OR  Informed Consent: I have reviewed the patients History and Physical, chart, labs and discussed the procedure including the risks, benefits and alternatives for the proposed anesthesia with the patient or authorized representative who has indicated his/her understanding and acceptance.   Dental advisory given  Plan Discussed with: Anesthesiologist, Surgeon and CRNA  Anesthesia Plan Comments:        Anesthesia Quick Evaluation

## 2012-10-30 NOTE — Preoperative (Signed)
Beta Blockers   Reason not to administer Beta Blockers:Not Applicable 

## 2012-10-30 NOTE — Interval H&P Note (Signed)
History and Physical Interval Note:  10/30/2012 7:22 AM  Sherry [name]  has presented today for surgery, with the diagnosis of parastomal hernia  The various methods of treatment have been discussed with the patient and family. After consideration of risks, benefits and other options for treatment, the patient has consented to  Procedure(s) (LRB) with comments: LAPAROSCOPIC VENTRAL HERNIA (N/A) - Laparoscopic repair of parastomal hernia,possible open HERNIA REPAIR VENTRAL ADULT (N/A) - laparoscopic repair of parastomal hernia possible open as a surgical intervention .  The patient's history has been reviewed, patient examined, no change in status, stable for surgery.  I have reviewed the patient's chart and labs.  Questions were answered to the patient's satisfaction.     Cal Gindlesperger K.

## 2012-10-30 NOTE — Op Note (Signed)
Preop diagnosis: Parastomal hernia Postop diagnosis: Same Procedure performed: Laparoscopic repair of parastomal hernia with mesh (Sugarbaker method) Surgeon:Vallorie Niccoli K.  Assistant: Dr. Lodema Pilot Anesthesia: Gen. Endotracheal Indications: This is an 78 year old female who is several years status post abdominoperineal resection for rectal cancer. She has a permanent descending colostomy. She has developed a peristomal hernia in this area which has become fairly large uncomfortable. She presents now for repair.  Description of procedure: The patient was brought to the operating room and placed in a supine position on the operating room table. After an adequate level of general anesthesia was obtained a Foley catheter was placed under sterile technique. The patient's abdomen was prepped with ChloraPrep. We prepped the ostomy with Betadine covered with a piece of 4 x 4 gauze and sealed this with an OpSite. We draped in sterile fashion. A timeout was taken to ensure the proper patient proper procedure. We infiltrated the area below the right costal margin with quarter percent Marcaine with epinephrine. A 5 mm Optiview trocar was slowly advanced into the peritoneal cavity. We insufflated CO2 maintaining a maximum pressure of 15 mm mercury. There were a lot of adhesions in the upper midline. However we were able to place a 5 mm port in the right lower quadrant and a 10 mm port in the right anterior axillary line at the level of the umbilicus. We then used sharp scissors to dissect the adhesions away from the abdominal wall. We removed identify the colostomy. The small bowel was reduced out of the parastomal hernia. We excised the hernia sac around the edge of the fascial defect. We pulled any excess descending colon out of the hernia defect. Once we were satisfied with our hemostasis we began our repair. We closed the fascia around the colostomy with several interrupted 0 Novafil sutures. We used the Endo  Close suture passer to facilitate this closure. We made sure not to close the fascia too tight around the colostomy. We then used a 15 x 20 cm piece of physio- mesh was cut into a rectangle shape. We placed 8 stay sutures of 0 Novafil around the edge of the mesh. We rolled the mesh and inserted into the cavity. We then pulled up the stay sutures using the Endo Close device through small stab incisions. The colostomy was brought in a lateral direction and Sugarbaker fashion. We then pulled up the sutures to secure the mesh. The secure strap device was then used to place multiple tacks in the mesh to secure to the abdominal wall. We placed a row of tacks around the colostomy which was directed in a lateral fashion.  Hemostasis was good. We irrigated thoroughly and inspected for hemostasis. The 11 mm port site was then closed with a 0 Vicryl. Pneumoperitoneum was then released as we removed the trochars. The port sites were closed with 4-0 Monocryl. All the incisions were sealed with Dermabond. A new ostomy appliance was placed. The patient was then extubated and brought to the recovery room in stable condition. All sponge, initially, and needle counts are correct.  Wilmon Arms. [name], MD, [facility]  10/30/2012 11:37 AM

## 2012-10-30 NOTE — Progress Notes (Signed)
Utilization review completed. Darin Redmann, RN, BSN. 

## 2012-10-30 NOTE — Anesthesia Postprocedure Evaluation (Signed)
  Anesthesia Post-op Note  Patient: Sherry [name]  Procedure(s) Performed: Procedure(s) (LRB) with comments: LAPAROSCOPIC VENTRAL HERNIA (N/A) - Laparoscopic repair of parastomal hernia  Patient Location: PACU  Anesthesia Type:General  Level of Consciousness: awake  Airway and Oxygen Therapy: Patient Spontanous Breathing  Post-op Pain: mild  Post-op Assessment: Post-op Vital signs reviewed  Post-op Vital Signs: Reviewed  Complications: No apparent anesthesia complications

## 2012-10-31 ENCOUNTER — Encounter (HOSPITAL_COMMUNITY): Payer: Self-pay | Admitting: Surgery

## 2012-10-31 LAB — CBC
HCT: 34.7 % — ABNORMAL LOW (ref 36.0–46.0)
Hemoglobin: 11.2 g/dL — ABNORMAL LOW (ref 12.0–15.0)
MCH: 30 pg (ref 26.0–34.0)
MCHC: 32.3 g/dL (ref 30.0–36.0)
RDW: 15 % (ref 11.5–15.5)

## 2012-10-31 LAB — BASIC METABOLIC PANEL
BUN: 8 mg/dL (ref 6–23)
Calcium: 9.6 mg/dL (ref 8.4–10.5)
Creatinine, Ser: 0.68 mg/dL (ref 0.50–1.10)
GFR calc non Af Amer: 76 mL/min — ABNORMAL LOW (ref >90)
Glucose, Bld: 118 mg/dL — ABNORMAL HIGH (ref 70–99)

## 2012-10-31 MED ORDER — POLYETHYLENE GLYCOL 3350 17 G PO PACK
17.0000 g | PACK | Freq: Every day | ORAL | Status: DC
  Administered 2012-10-31 – 2012-11-02 (×3): 17 g via ORAL
  Filled 2012-10-31 (×6): qty 1

## 2012-10-31 MED ORDER — DOCUSATE SODIUM 100 MG PO CAPS
100.0000 mg | ORAL_CAPSULE | Freq: Two times a day (BID) | ORAL | Status: DC
  Administered 2012-10-31 – 2012-11-05 (×11): 100 mg via ORAL
  Filled 2012-10-31 (×12): qty 1

## 2012-10-31 NOTE — Progress Notes (Signed)
1 Day Post-Op  Subjective: Patient is more comfortable, until she tries to move. No nausea this morning - tolerating clears No ostomy output yet  Objective: Vital signs in last 24 hours: Temp:  [96.8 F (36 C)-98.7 F (37.1 C)] 98.7 F (37.1 C) (11/15 0606) Pulse Rate:  [58-66] 64  (11/15 0606) Resp:  [16-22] 18  (11/15 0606) BP: (106-130)/(46-85) 130/58 mmHg (11/15 0606) SpO2:  [95 %-100 %] 98 % (11/15 0606) Weight:  [138 lb 12.5 oz (62.95 kg)] 138 lb 12.5 oz (62.95 kg) (11/14 1142) Last BM Date: 10/30/12 (colostomy)  Intake/Output from previous day: 11/14 0701 - 11/15 0700 In: 3767.8 [P.O.:240; I.V.:3527.8] Out: 3045 [Urine:2995; Blood:50] Intake/Output this shift:    General appearance: alert, cooperative and no distress Resp: clear to auscultation bilaterally GI: soft, tender left side around hernia repair/ colostomy Port sites c/d/i  Lab Results:  CBC pending BMET  Basename 10/31/12 0536  NA 138  K 4.2  CL 106  CO2 27  GLUCOSE 118*  BUN 8  CREATININE 0.68  CALCIUM 9.6   PT/INR No results found for this basename: LABPROT:2,INR:2 in the last 72 hours ABG No results found for this basename: PHART:2,PCO2:2,PO2:2,HCO3:2 in the last 72 hours  Studies/Results: No results found.  Anti-infectives: Anti-infectives     Start     Dose/Rate Route Frequency Ordered Stop   10/30/12 1400   ceFAZolin (ANCEF) IVPB 1 g/50 mL premix        1 g 100 mL/hr over 30 Minutes Intravenous Every 6 hours 10/30/12 1141 10/31/12 0222   10/30/12 0000   ceFAZolin (ANCEF) IVPB 2 g/50 mL premix        2 g 100 mL/hr over 30 Minutes Intravenous On call to O.R. 10/29/12 1434 10/30/12 0730          Assessment/Plan: s/p Procedure(s) (LRB) with comments: LAPAROSCOPIC VENTRAL HERNIA (N/A) - Laparoscopic repair of parastomal hernia abdominal binder Ambulate PO pain meds with IV back-up Home when ostomy functioning and pain under control with PO pain meds   LOS: 1 day     Sherry [name] K. 10/31/2012

## 2012-10-31 NOTE — Progress Notes (Signed)
Orthopedic Tech Progress Note Patient Details:  Sherry [name] 1923/03/18 [id number] Abdominal binder order in nursing notes. Binder delivered to nurse in room with patient. Ortho Devices Type of Ortho Device: Abdominal binder Ortho Device/Splint Interventions: Ordered   [place] R Thompson 10/31/2012, 12:11 PM

## 2012-11-01 LAB — BASIC METABOLIC PANEL
Calcium: 10 mg/dL (ref 8.4–10.5)
GFR calc Af Amer: >90 mL/min (ref >90)
GFR calc non Af Amer: 78 mL/min — ABNORMAL LOW (ref >90)
Glucose, Bld: 133 mg/dL — ABNORMAL HIGH (ref 70–99)
Sodium: 138 mEq/L (ref 135–145)

## 2012-11-01 LAB — CBC
MCH: 30.2 pg (ref 26.0–34.0)
MCHC: 32.6 g/dL (ref 30.0–36.0)
Platelets: 183 10*3/uL (ref 150–400)
RDW: 15.1 % (ref 11.5–15.5)

## 2012-11-01 NOTE — Progress Notes (Signed)
2 Days Post-Op  Subjective: Stable and alert. Voiding without difficulty. Has been sipping on liquids. A little nauseated. Says she vomited once yesterday. Says pain control is good. very pleasant.No significant output from colostomy  Hemoglobin 11.4. WBC 5200. Electrolytes normal, potassium 4.4.  Objective: Vital signs in last 24 hours: Temp:  [98.4 F (36.9 C)-99.2 F (37.3 C)] 98.4 F (36.9 C) (11/16 4098) Pulse Rate:  [77-79] 78  (11/16 0613) Resp:  [18-20] 18  (11/16 0613) BP: (128-133)/(57-68) 128/57 mmHg (11/16 0613) SpO2:  [93 %-99 %] 95 % (11/16 0613) Last BM Date: 10/30/12  Intake/Output from previous day: 11/15 0701 - 11/16 0700 In: 1386.7 [P.O.:240; I.V.:1146.7] Out: 100 [Emesis/NG output:100] Intake/Output this shift:    General appearance: alert. Cooperative. Mental status normal. No distress. GI: abdomen soft, stoma looks good, minimal fluid in bag, hernia repair appears intact. Not really distended. Minimal bowel sounds.  Lab Results:  Results for orders placed during the hospital encounter of 10/30/12 (from the past 24 hour(s))  CBC     Status: Abnormal   Collection Time   11/01/12  6:50 AM      Component Value Range   WBC 5.2  4.0 - 10.5 K/uL   RBC 3.78 (*) 3.87 - 5.11 MIL/uL   Hemoglobin 11.4 (*) 12.0 - 15.0 g/dL   HCT 11.9 (*) 14.7 - 82.9 %   MCV 92.6  78.0 - 100.0 fL   MCH 30.2  26.0 - 34.0 pg   MCHC 32.6  30.0 - 36.0 g/dL   RDW 56.2  13.0 - 86.5 %   Platelets 183  150 - 400 K/uL  BASIC METABOLIC PANEL     Status: Abnormal   Collection Time   11/01/12  6:50 AM      Component Value Range   Sodium 138  135 - 145 mEq/L   Potassium 4.4  3.5 - 5.1 mEq/L   Chloride 107  96 - 112 mEq/L   CO2 26  19 - 32 mEq/L   Glucose, Bld 133 (*) 70 - 99 mg/dL   BUN 7  6 - 23 mg/dL   Creatinine, Ser 7.84  0.50 - 1.10 mg/dL   Calcium 69.6  8.4 - 29.5 mg/dL   GFR calc non Af Amer 78 (*) >90 mL/min   GFR calc Af Amer >90  >90 mL/min      Studies/Results: @RISRSLT24 @     . atorvastatin  10 mg Oral Daily  . diltiazem  180 mg Oral Daily  . docusate sodium  100 mg Oral BID  . enoxaparin  40 mg Subcutaneous Q24H  . ketorolac  15 mg Intravenous Q6H  . levothyroxine  75 mcg Oral QAC breakfast  . metoprolol succinate  12.5 mg Oral Daily  . oxybutynin  10 mg Oral Daily  . polyethylene glycol  17 g Oral Daily     Assessment/Plan: s/p Procedure(s): LAPAROSCOPIC VENTRAL HERNIA  POD #2. Stable. Still has ileus Stale liquids as tolerated. Ambulate more. Continue IV support.  Patient Active Hospital Problem List: No active hospital problems.   LOS: 2 days    Sherry [name] 11/01/2012  . .prob

## 2012-11-02 NOTE — Progress Notes (Signed)
3 Days Post-Op  Subjective: No stool or flatus from ostomy yet, but otherwise doing well. Alert and comfortable. Ambulating.  She does get nauseated when she takes Percocet, and so we are just going to stick with small Doses of IV morphine now.Tolerating some full liquids, 780 cc yesterday.  Objective: Vital signs in last 24 hours: Temp:  [98.2 F (36.8 C)-98.9 F (37.2 C)] 98.8 F (37.1 C) (11/17 1013) Pulse Rate:  [60-74] 60  (11/17 1013) Resp:  [18-20] 20  (11/17 1013) BP: (131-149)/(61-90) 135/62 mmHg (11/17 1013) SpO2:  [92 %-95 %] 92 % (11/17 0555) Last BM Date: 10/30/12  Intake/Output from previous day: 11/16 0701 - 11/17 0700 In: 2491.5 [P.O.:780; I.V.:1711.5] Out: 0  Intake/Output this shift:    General appearance: Looks good. Alert. Mental status normal. No distress. GI: Abdomen soft. Nondistended. Wounds looked good. Stoma looks healthy. Occasional bowel sounds  Lab Results:  No results found for this or any previous visit (from the past 24 hour(s)).   Studies/Results: @RISRSLT24 @     . atorvastatin  10 mg Oral Daily  . diltiazem  180 mg Oral Daily  . docusate sodium  100 mg Oral BID  . enoxaparin  40 mg Subcutaneous Q24H  . ketorolac  15 mg Intravenous Q6H  . levothyroxine  75 mcg Oral QAC breakfast  . metoprolol succinate  12.5 mg Oral Daily  . oxybutynin  10 mg Oral Daily  . polyethylene glycol  17 g Oral Daily     Assessment/Plan: s/p Procedure(s): LAPAROSCOPIC VENTRAL HERNIA  POD #3. Stable. Still has ileus. No evidence of obstruction. Continue liquid diet Ambulate Continue IV support  Patient Active Hospital Problem List: No active hospital problems.   LOS: 3 days    Roylene Heaton M 11/02/2012  . .prob

## 2012-11-03 ENCOUNTER — Inpatient Hospital Stay (HOSPITAL_COMMUNITY): Payer: Medicare Other

## 2012-11-03 MED ORDER — IOHEXOL 300 MG/ML  SOLN
300.0000 mL | Freq: Once | INTRAMUSCULAR | Status: AC | PRN
  Administered 2012-11-03: 300 mL

## 2012-11-03 NOTE — Progress Notes (Signed)
4 Days Post-Op  Subjective: Feels well.  Still sore below colostomy.  Minimal output Using IV pain meds sparingly - PO causing some nausea since she is just taking liquids   Objective: Vital signs in last 24 hours: Temp:  [98 F (36.7 C)-98.9 F (37.2 C)] 98.3 F (36.8 C) (11/18 0533) Pulse Rate:  [54-68] 54  (11/18 0533) Resp:  [17-20] 18  (11/18 0533) BP: (135-139)/(59-70) 139/59 mmHg (11/18 0533) SpO2:  [94 %-99 %] 94 % (11/18 0533) Last BM Date: 10/30/12  Intake/Output from previous day: 11/17 0701 - 11/18 0700 In: 2370 [P.O.:600; I.V.:1770] Out: 1000 [Urine:1000] Intake/Output this shift:    General appearance: alert, cooperative and no distress GI: soft, non-distended; wounds c/d/i Ostomy - pink; viable; very little output  Lab Results:   Basename 11/01/12 0650  WBC 5.2  HGB 11.4*  HCT 35.0*  PLT 183   BMET  Basename 11/01/12 0650  NA 138  K 4.4  CL 107  CO2 26  GLUCOSE 133*  BUN 7  CREATININE 0.64  CALCIUM 10.0   PT/INR No results found for this basename: LABPROT:2,INR:2 in the last 72 hours ABG No results found for this basename: PHART:2,PCO2:2,PO2:2,HCO3:2 in the last 72 hours  Studies/Results: No results found.  Anti-infectives: Anti-infectives     Start     Dose/Rate Route Frequency Ordered Stop   10/30/12 1400   ceFAZolin (ANCEF) IVPB 1 g/50 mL premix        1 g 100 mL/hr over 30 Minutes Intravenous Every 6 hours 10/30/12 1141 10/31/12 0222   10/30/12 0000   ceFAZolin (ANCEF) IVPB 2 g/50 mL premix        2 g 100 mL/hr over 30 Minutes Intravenous On call to O.R. 10/29/12 1434 10/30/12 0730          Assessment/Plan: s/p Procedure(s) (LRB) with comments: LAPAROSCOPIC VENTRAL HERNIA (N/A) - Laparoscopic repair of parastomal hernia Awaiting colostomy function; otherwise doing quite well Gastrografin enema via colostomy today - dual purpose - enema as well as ruling out obstruction from mesh repair Ambulate Hopefully, if ostomy  begins to function, can advance diet and discharge soon.  LOS: 4 days    Sherry Derhammer K. 11/03/2012

## 2012-11-04 LAB — GLUCOSE, CAPILLARY: Glucose-Capillary: 127 mg/dL — ABNORMAL HIGH (ref 70–99)

## 2012-11-04 MED ORDER — IBUPROFEN 600 MG PO TABS
600.0000 mg | ORAL_TABLET | Freq: Four times a day (QID) | ORAL | Status: DC | PRN
  Filled 2012-11-04: qty 1

## 2012-11-04 MED ORDER — OXYCODONE-ACETAMINOPHEN 5-325 MG PO TABS
1.0000 | ORAL_TABLET | ORAL | Status: DC | PRN
  Administered 2012-11-04: 2 via ORAL
  Administered 2012-11-04 – 2012-11-05 (×2): 1 via ORAL
  Filled 2012-11-04: qty 2
  Filled 2012-11-04 (×2): qty 1

## 2012-11-04 MED ORDER — OXYCODONE-ACETAMINOPHEN 5-325 MG PO TABS
1.0000 | ORAL_TABLET | ORAL | Status: DC | PRN
  Administered 2012-11-04: 1 via ORAL
  Filled 2012-11-04: qty 1

## 2012-11-04 NOTE — Progress Notes (Signed)
5 Days Post-Op  Subjective: Doing well Large ostomy output after contrast enema No significant findings on study - slight narrowing, probably at the area of the mesh  Objective: Vital signs in last 24 hours: Temp:  [98.3 F (36.8 C)-98.4 F (36.9 C)] 98.4 F (36.9 C) (11/19 0549) Pulse Rate:  [57-68] 68  (11/19 0549) Resp:  [18] 18  (11/19 0549) BP: (135-151)/(63-76) 147/69 mmHg (11/19 0549) SpO2:  [93 %-96 %] 96 % (11/19 0549) Last BM Date: 11/03/12  Intake/Output from previous day: 11/18 0701 - 11/19 0700 In: 2029.2 [P.O.:360; I.V.:1669.2] Out: 1600 [Urine:1300; Stool:300] Intake/Output this shift: Total I/O In: 1107 [I.V.:1107] Out: 1300 [Urine:1300]  GI: soft, minimal tenderness; incisions c/d/i Large stool output  Lab Results:   [facility] 11/01/12 0650  WBC 5.2  HGB 11.4*  HCT 35.0*  PLT 183   BMET  Basename 11/01/12 0650  NA 138  K 4.4  CL 107  CO2 26  GLUCOSE 133*  BUN 7  CREATININE 0.64  CALCIUM 10.0   PT/INR No results found for this basename: LABPROT:2,INR:2 in the last 72 hours ABG No results found for this basename: PHART:2,PCO2:2,PO2:2,HCO3:2 in the last 72 hours  Studies/Results: Dg Colon W/water Sol Cm  11/03/2012  Clinical Data: Recent repair of parastomal hernia.  No output.  WATER SOLUBLE CONTRAST ENEMA  Technique:  Initial scout AP supine abdominal image was obtained. Water soluble contrast was introduced into the colon in a retrograde fashion and refluxed from the rectum to the cecum.  Spot images of the colon followed by overhead radiographs were obtained.  Fluoroscopy time: 4.33 minutes slow pulsed fluoroscopy.  Comparison:  CT abdomen dated 08/04/2012  Findings:  Water-soluble contrast was instilled in the colon through the ostomy in the left mid abdomen.  There is an area of narrowing of the colon just below the ostomy which may be edema in the abdominal wall. There are a few diverticula just proximal to the ostomy.  There is scattered  stool throughout the nondistended colon.    There is no reflux into terminal ileum.  There are a few diverticula noted in the right side of the colon.  IMPRESSION:   No evidence of colon obstruction.  There is slight narrowing of the colon at the ostomy site as it passes through the anterior abdominal wall.  However, the proximal colon is not dilated.  There is stool scattered throughout the colon.   Original Report Authenticated By: Francene Boyers, M.D.     Anti-infectives: Anti-infectives     Start     Dose/Rate Route Frequency Ordered Stop   10/30/12 1400   ceFAZolin (ANCEF) IVPB 1 g/50 mL premix        1 g 100 mL/hr over 30 Minutes Intravenous Every 6 hours 10/30/12 1141 10/31/12 0222   10/30/12 0000   ceFAZolin (ANCEF) IVPB 2 g/50 mL premix        2 g 100 mL/hr over 30 Minutes Intravenous On call to O.R. 10/29/12 1434 10/30/12 0730          Assessment/Plan: s/p Procedure(s) (LRB) with comments: LAPAROSCOPIC VENTRAL HERNIA (N/A) - Laparoscopic repair of parastomal hernia Advance diet Plan for discharge tomorrow saline lock, PO pain meds  LOS: 5 days    Sherry [name] K. 11/04/2012

## 2012-11-05 MED ORDER — OXYCODONE-ACETAMINOPHEN 5-325 MG PO TABS: 1.0000 | ORAL_TABLET | ORAL | Status: DC | PRN

## 2012-11-05 NOTE — Discharge Summary (Signed)
Physician Discharge Summary  Patient ID: Sherry [name] MRN: [id number] DOB/AGE: 1922/12/25 78 y.o.  Admit date: 10/30/2012 Discharge date: 11/05/2012  Admission Diagnoses:  Parastomal hernia  Discharge Diagnoses: Parastomal hernia Active Problems:  * No active hospital problems. *    Discharged Condition: good  Hospital Course: Laparoscopic repair of parastomal hernia (Sugarbaker method).  Post-op pain control was an issue for the first few days.  She also had a slight post-operative ileus.  On 11/18, she underwent a gastrografin enema which showed some slight narrowing, but no obstruction of the colon.  The narrowing is likely at the location of the mesh.  Her ostomy has begun to function well and her pain is controlled with Percocet.  Consults: None  Significant Diagnostic Studies: radiology: Contrast enema as above  Treatments: surgery: Lap parastomal hernia repair with mesh  Discharge Exam: Blood pressure 122/62, pulse 63, temperature 97.8 F (36.6 C), temperature source Oral, resp. rate 18, height 5\' 3"  (1.6 m), weight 138 lb 12.5 oz (62.95 kg), SpO2 96.00%. General appearance: alert, cooperative and no distress Resp: clear to auscultation bilaterally Cardio: regular rate and rhythm, S1, S2 normal, no murmur, click, rub or gallop GI: soft, non-tender; bowel sounds normal; no masses,  no organomegaly Incisions c/d/i Ostomy - functioning well; sore lateral to ostomy   Disposition:  Discharge home  Discharge Orders    Future Appointments: Provider: Department: Dept Phone: Center:   11/21/2012 10:40 AM Wilmon Arms. [name], MD [facility] Surgery, [place] [phone] None   03/11/2013 3:00 PM [name] [facility] [phone] None   03/11/2013 3:30 PM Benjiman Core, MD [facility] MEDICAL ONCOLOGY [phone] None     Future Orders Please Complete By Expires   Diet general      Increase activity slowly      May walk up  steps      May shower / Bathe      Driving Restrictions      Comments:   Do not drive while taking pain medications   Call MD for:  temperature >100.4      Call MD for:  persistant nausea and vomiting      Call MD for:  severe uncontrolled pain      Call MD for:  redness, tenderness, or signs of infection (pain, swelling, redness, odor or green/yellow discharge around incision site)      Discharge instructions      Comments:   [facility], [place] [phone]   ABDOMINAL SURGERY: POST OP INSTRUCTIONS  Always review your discharge instruction sheet given to you by the facility where your surgery was performed.  IF YOU HAVE DISABILITY OR FAMILY LEAVE FORMS, YOU MUST BRING THEM TO THE OFFICE FOR PROCESSING.  PLEASE DO NOT GIVE THEM TO YOUR DOCTOR.  A prescription for pain medication may be given to you upon discharge.  Take your pain medication as prescribed, if needed.  If narcotic pain medicine is not needed, then you may take acetaminophen (Tylenol) or ibuprofen (Advil) as needed. Take your usually prescribed medications unless otherwise directed. If you need a refill on your pain medication, please contact your pharmacy. They will contact our office to request authorization.  Prescriptions will not be filled after 5pm or on week-ends. You should follow a light diet the first few days after arrival home, such as soup and crackers, pudding, etc.unless your doctor has advised otherwise. A high-fiber, low fat  diet can be resumed as tolerated.   Be sure to include lots of fluids daily. Most patients will experience some swelling and bruising on the chest and neck area.  Ice packs will help.  Swelling and bruising can take several days to resolve Most patients will experience some swelling and bruising in the area of the incision. Ice pack will help. Swelling and bruising can take several days to resolve..  It is common to experience some constipation if taking pain medication  after surgery.  Increasing fluid intake and taking a stool softener will usually help or prevent this problem from occurring.  A mild laxative (Milk of Magnesia or Miralax) should be taken according to package directions if there are no bowel movements after 48 hours.  You may have steri-strips (small skin tapes) in place directly over the incision.  These strips should be left on the skin for 7-10 days.  If your surgeon used skin glue on the incision, you may shower in 24 hours.  The glue will flake off over the next 2-3 weeks.  Any sutures or staples will be removed at the office during your follow-up visit. You may find that a light gauze bandage over your incision may keep your staples from being rubbed or pulled. You may shower and replace the bandage daily. ACTIVITIES:  You may resume regular (light) daily activities beginning the next day-such as daily self-care, walking, climbing stairs-gradually increasing activities as tolerated.  You may have sexual intercourse when it is comfortable.  Refrain from any heavy lifting or straining until approved by your doctor. You may drive when you no longer are taking prescription pain medication, you can comfortably wear a seatbelt, and you can safely maneuver your car and apply brakes Return to Work: ___________________________________ Bonita Quin should see your doctor in the office for a follow-up appointment approximately two weeks after your surgery.  Make sure that you call for this appointment within a day or two after you arrive home to insure a convenient appointment time. OTHER INSTRUCTIONS:  _____________________________________________________________ _____________________________________________________________  WHEN TO CALL YOUR DOCTOR: Fever over 101.0 Inability to urinate Nausea and/or vomiting Extreme swelling or bruising Continued bleeding from incision. Increased pain, redness, or drainage from the incision. Difficulty swallowing or  breathing Muscle cramping or spasms. Numbness or tingling in hands or feet or around lips.  The clinic staff is available to answer your questions during regular business hours.  Please don't hesitate to call and ask to speak to one of the nurses if you have concerns.  For further questions, please visit [email or web address]       Medication List     As of 11/05/2012  7:46 AM    TAKE these medications         albuterol 108 (90 BASE) MCG/ACT inhaler   Commonly known as: PROVENTIL HFA;VENTOLIN HFA   Inhale 2 puffs into the lungs every 4 (four) hours as needed. For shortness of breath      atorvastatin 10 MG tablet   Commonly known as: LIPITOR   Take 10 mg by mouth daily.      diltiazem 180 MG 24 hr capsule   Commonly known as: TIAZAC   Take 180 mg by mouth daily.      levothyroxine 75 MCG tablet   Commonly known as: SYNTHROID, LEVOTHROID   Take 75 mcg by mouth daily.      metoprolol succinate 25 MG 24 hr tablet   Commonly known as: TOPROL-XL   Take 12.5 mg  by mouth daily. 1/2 daily      oxybutynin 10 MG 24 hr tablet   Commonly known as: DITROPAN-XL   Take 10 mg by mouth daily.      oxyCODONE-acetaminophen 5-325 MG per tablet   Commonly known as: PERCOCET/ROXICET   Take 1-2 tablets by mouth every 4 (four) hours as needed.      traMADol-acetaminophen 37.5-325 MG per tablet   Commonly known as: ULTRACET   Take 1 tablet by mouth every 6 (six) hours as needed. For pain           Follow-up Information    Follow up with Wynona Luna., MD. Schedule an appointment as soon as possible for a visit in 2 weeks.   Contact information:   [address] [place] [address] [phone]          Signed: Wynona Luna. 11/05/2012, 7:46 AM

## 2012-11-05 NOTE — Progress Notes (Signed)
Patient discharged to home with family.  Discharge teaching done including follow up care, medications and signs and symptoms of infection.  Patient verbalizes understanding with no further questions.  Vital signs stable, no complaints of pain.  Tolerating diet with no nausea.

## 2012-11-18 ENCOUNTER — Encounter (INDEPENDENT_AMBULATORY_CARE_PROVIDER_SITE_OTHER): Payer: Medicare Other | Admitting: Surgery

## 2012-11-21 ENCOUNTER — Encounter (INDEPENDENT_AMBULATORY_CARE_PROVIDER_SITE_OTHER): Payer: Medicare Other | Admitting: Surgery

## 2012-11-28 ENCOUNTER — Ambulatory Visit (INDEPENDENT_AMBULATORY_CARE_PROVIDER_SITE_OTHER): Payer: Medicare Other | Admitting: Surgery

## 2012-11-28 ENCOUNTER — Encounter (INDEPENDENT_AMBULATORY_CARE_PROVIDER_SITE_OTHER): Payer: Self-pay | Admitting: Surgery

## 2012-11-28 VITALS — BP 120/76 | HR 72 | Temp 97.6°F | Resp 16 | Ht 63.0 in | Wt 135.2 lb

## 2012-11-28 DIAGNOSIS — K435 Parastomal hernia without obstruction or  gangrene: Secondary | ICD-10-CM

## 2012-11-28 DIAGNOSIS — K469 Unspecified abdominal hernia without obstruction or gangrene: Secondary | ICD-10-CM

## 2012-11-28 MED ORDER — HYDROCODONE-ACETAMINOPHEN 5-325 MG PO TABS: 1.0000 | ORAL_TABLET | ORAL | Status: DC | PRN

## 2012-11-28 NOTE — Progress Notes (Signed)
Status post laparoscopic repair of a parastomal hernia on 10/30/12. The patient continues to have a lot of tenderness just to the left of her stoma. This is in the area of some of the stay sutures to come out near her anterior superior iliac spine. She feels this pain significantly when she tries to roll on her side. It feels better when she is off of her feet. I encouraged her to keep using her abdominal binder. Her colostomy seems to be working better since surgery. She has not developed any swelling around this area.  Filed Vitals:   11/28/12 1059  BP: 120/76  Pulse: 72  Temp: 97.6 F (36.4 C)  Resp: 16    Her laparoscopic incisions are well-healed with no sign of infection. She has point tenderness at the 2 lateral incisions for the stay sutures that are near her left hip. No sign of recurrent parastomal hernia. Her colostomy is pink and functioning well. No bleeding noted.  I assured her that the pain at the stay sutures is fairly typical after her hernia repair. This should improve as the mesh scars in place. She may use Vicodin as well as when necessary ibuprofen. She should also use her abdominal binder. Recheck her in 3-4 weeks.  Wilmon Arms. [name], MD, [facility]  11/28/2012 11:20 AM

## 2012-12-26 ENCOUNTER — Ambulatory Visit (INDEPENDENT_AMBULATORY_CARE_PROVIDER_SITE_OTHER): Payer: Medicare Other | Admitting: Surgery

## 2012-12-26 ENCOUNTER — Encounter (INDEPENDENT_AMBULATORY_CARE_PROVIDER_SITE_OTHER): Payer: Self-pay | Admitting: Surgery

## 2012-12-26 VITALS — BP 141/83 | HR 74 | Temp 98.0°F | Resp 12 | Ht 63.0 in | Wt 137.8 lb

## 2012-12-26 DIAGNOSIS — K469 Unspecified abdominal hernia without obstruction or gangrene: Secondary | ICD-10-CM

## 2012-12-26 DIAGNOSIS — K435 Parastomal hernia without obstruction or  gangrene: Secondary | ICD-10-CM

## 2012-12-26 NOTE — Progress Notes (Signed)
Status post laparoscopic parastomal hernia repair with mesh on 10/30/12. The patient is here for final postop visit. All of the left lateral tenderness is completely resolved. She has resumed full activity. There's no swelling around the colostomy. No tenderness to palpation. An ostomy is functioning well with no sign of obstruction. She may return to see [place] as needed.  Wilmon Arms. [name], MD, [facility]  12/26/2012 9:27 AM

## 2012-12-31 ENCOUNTER — Encounter (INDEPENDENT_AMBULATORY_CARE_PROVIDER_SITE_OTHER): Payer: Medicare Other | Admitting: Ophthalmology

## 2012-12-31 DIAGNOSIS — H35039 Hypertensive retinopathy, unspecified eye: Secondary | ICD-10-CM

## 2012-12-31 DIAGNOSIS — I10 Essential (primary) hypertension: Secondary | ICD-10-CM

## 2012-12-31 DIAGNOSIS — H2700 Aphakia, unspecified eye: Secondary | ICD-10-CM

## 2012-12-31 DIAGNOSIS — H43819 Vitreous degeneration, unspecified eye: Secondary | ICD-10-CM

## 2012-12-31 DIAGNOSIS — H31009 Unspecified chorioretinal scars, unspecified eye: Secondary | ICD-10-CM

## 2012-12-31 DIAGNOSIS — B399 Histoplasmosis, unspecified: Secondary | ICD-10-CM

## 2012-12-31 DIAGNOSIS — H26499 Other secondary cataract, unspecified eye: Secondary | ICD-10-CM

## 2013-01-21 ENCOUNTER — Other Ambulatory Visit (INDEPENDENT_AMBULATORY_CARE_PROVIDER_SITE_OTHER): Payer: Self-pay | Admitting: Surgery

## 2013-01-21 ENCOUNTER — Ambulatory Visit (INDEPENDENT_AMBULATORY_CARE_PROVIDER_SITE_OTHER): Payer: Medicare Other | Admitting: Surgery

## 2013-01-21 ENCOUNTER — Encounter (INDEPENDENT_AMBULATORY_CARE_PROVIDER_SITE_OTHER): Payer: Self-pay | Admitting: Surgery

## 2013-01-21 VITALS — BP 148/72 | HR 62 | Temp 98.7°F | Ht 63.0 in | Wt 138.2 lb

## 2013-01-21 DIAGNOSIS — L989 Disorder of the skin and subcutaneous tissue, unspecified: Secondary | ICD-10-CM

## 2013-01-21 DIAGNOSIS — R109 Unspecified abdominal pain: Secondary | ICD-10-CM | POA: Diagnosis not present

## 2013-01-21 DIAGNOSIS — B079 Viral wart, unspecified: Secondary | ICD-10-CM

## 2013-01-21 NOTE — Progress Notes (Signed)
The patient comes in with the complaint of a painful irritated skin lesion in her left lower quadrant lateral to her colostomy. This has been present for several months and has never healed. It is fairly tender.  On examination her ostomy is functioning well. Her incisions are all well-healed. Lateral to the stay suture scars around her colostomy there is a 1.5 cm verrucous skin lesion. This shows some irritation. I recommended excision. We prepped this area with Betadine and anesthetized with 1% lidocaine. I excised the skin lesion completely. We closed the wound with 2 interrupted 3-0 nylon sutures. A dry dressing was applied. She will come in late next week for suture removal. We will call her with the pathology result.  Wilmon Arms. [name], MD, [facility]  01/21/2013 3:16 PM

## 2013-01-22 ENCOUNTER — Encounter: Payer: Self-pay | Admitting: Family Medicine

## 2013-01-22 ENCOUNTER — Ambulatory Visit (INDEPENDENT_AMBULATORY_CARE_PROVIDER_SITE_OTHER): Payer: Medicare Other | Admitting: Family Medicine

## 2013-01-22 VITALS — BP 140/84 | HR 80 | Temp 98.3°F | Wt 139.0 lb

## 2013-01-22 DIAGNOSIS — R21 Rash and other nonspecific skin eruption: Secondary | ICD-10-CM

## 2013-01-22 DIAGNOSIS — M545 Low back pain, unspecified: Secondary | ICD-10-CM

## 2013-01-22 DIAGNOSIS — R300 Dysuria: Secondary | ICD-10-CM | POA: Diagnosis not present

## 2013-01-22 LAB — POCT URINALYSIS DIPSTICK
Nitrite, UA: NEGATIVE
Urobilinogen, UA: 1
pH, UA: 5.5

## 2013-01-22 MED ORDER — VALACYCLOVIR HCL 1 G PO TABS: 1000.0000 mg | ORAL_TABLET | Freq: Two times a day (BID) | ORAL | Status: DC

## 2013-01-22 NOTE — Progress Notes (Signed)
Chief Complaint  Patient presents with  . Urinary Frequency    soreness; hurting in back     HPI:  Acute visit for ? UTI: -started: 2 days ago -symptoms: urinary frequency and urgency, low back pain on R- resolved now, burning when urinates 2 days ago -denies: fevers, flank pain, NVD, abd/pelvic pain, gross hematuria -hx of: many UTIs  Skin Rash: -started yesterday on L buttock -feels like shingles, has had a number of times before and takes antiviral -feels exactly the same as prior outbreaks - tingling and itchy  ROS: See pertinent positives and negatives per HPI.  Past Medical History  Diagnosis Date  . CAD (coronary artery disease)   . Hyperlipidemia   . Hypertension   . Anemia   . COPD (chronic obstructive pulmonary disease)   . Headache   . Hypothyroidism   . Osteoporosis   . Shingles   . Arthritis   . Tendon adhesions     torn tendon right shoulder  . Rectal adenocarcinoma dx'd 08/2009  . Rectal cancer   . Shortness of breath   . GERD (gastroesophageal reflux disease)   . History of blood transfusion     Family History  Problem Relation Age of Onset  . Heart failure Father   . Cancer Mother     HEAD AND NECK  . Heart failure Sister   . Heart failure Brother   . Colon cancer Brother   . Colon cancer Brother   . Cervical cancer      History   Social History  . Marital Status: Married    Spouse Name: N/A    Number of Children: 3  . Years of Education: N/A   Social History Main Topics  . Smoking status: Former Smoker -- 65 years    Quit date: 12/18/2003  . Smokeless tobacco: Never Used     Comment: quit 2005  . Alcohol Use: No  . Drug Use: No  . Sexually Active: No   Other Topics Concern  . None   Social History Narrative  . None    Current outpatient prescriptions:albuterol (PROVENTIL HFA;VENTOLIN HFA) 108 (90 BASE) MCG/ACT inhaler, Inhale 2 puffs into the lungs every 4 (four) hours as needed. For shortness of breath, Disp: , Rfl: ;   atorvastatin (LIPITOR) 10 MG tablet, Take 10 mg by mouth daily., Disp: , Rfl: ;  diltiazem (TIAZAC) 180 MG 24 hr capsule, Take 180 mg by mouth daily., Disp: , Rfl:  HYDROcodone-acetaminophen (NORCO/VICODIN) 5-325 MG per tablet, Take 1 tablet by mouth every 4 (four) hours as needed for pain., Disp: 40 tablet, Rfl: 0;  levothyroxine (SYNTHROID, LEVOTHROID) 75 MCG tablet, Take 75 mcg by mouth daily., Disp: , Rfl: ;  metoprolol succinate (TOPROL-XL) 25 MG 24 hr tablet, Take 12.5 mg by mouth daily. 1/2 daily, Disp: , Rfl: ;  oxybutynin (DITROPAN-XL) 10 MG 24 hr tablet, Take 10 mg by mouth daily., Disp: , Rfl:  traMADol-acetaminophen (ULTRACET) 37.5-325 MG per tablet, Take 1 tablet by mouth every 6 (six) hours as needed. For pain, Disp: , Rfl: ;  valACYclovir (VALTREX) 1000 MG tablet, Take 1 tablet (1,000 mg total) by mouth 2 (two) times daily., Disp: 20 tablet, Rfl: 0  EXAM:  Filed Vitals:   01/22/13 1110  BP: 140/84  Pulse: 80  Temp: 98.3 F (36.8 C)    There is no height on file to calculate BMI.  GENERAL: vitals reviewed and listed above, alert, oriented, appears well hydrated and in no acute  distress  HEENT: atraumatic, conjunttiva clear, no obvious abnormalities on inspection of external nose and ears  NECK: no obvious masses on inspection  LUNGS: clear to auscultation bilaterally, no wheezes, rales or rhonchi, good air movement  CV: HRRR, no peripheral edema  ABD: soft, NTTP, no CVA TTP  MS: moves all extremities without noticeable abnormality Normal Gait - walks with cane Normal inspection of back, no obvious scoliosis or leg length descrepancy TTP at: R PSIS  SKIN: -few erythematous lesions L buttock with small vsicles  PSYCH: pleasant and cooperative, no obvious depression or anxiety  ASSESSMENT AND PLAN:  Discussed the following assessment and plan:  1. Dysuria  POCT urinalysis dipstick, Culture, Urine  2. Low back pain    3. Skin rash  valACYclovir (VALTREX) 1000 MG  tablet   -mild urinary symptoms with udip  No c/w UTI, culture pending -findings on exam suggest possible SI joint dysfunction - advised heat, stretching and tylenol for this and follow up if not resolving over next several weeks -valtrex for likely shingles - risks discussed -Patient advised to return or notify a doctor immediately if symptoms worsen or persist or new concerns arise.  There are no Patient Instructions on file for this visit.   Kriste Basque R.

## 2013-01-23 ENCOUNTER — Telehealth (INDEPENDENT_AMBULATORY_CARE_PROVIDER_SITE_OTHER): Payer: Self-pay | Admitting: General Surgery

## 2013-01-23 NOTE — Telephone Encounter (Signed)
Called patient this morning and told her path came back ok and she will see [name] on 01-29-13 for suture removal

## 2013-01-28 ENCOUNTER — Telehealth (INDEPENDENT_AMBULATORY_CARE_PROVIDER_SITE_OTHER): Payer: Self-pay | Admitting: General Surgery

## 2013-01-28 NOTE — Telephone Encounter (Signed)
Called Mrs Brunke this morning to see if she wanted to come in today 01-28-13 due to the bad weather that was coming in on Thursday 01-29-13. She stated that her daughter was in PT and will not be back until later. So I offer her to come in on Friday to see me at 1:30, but her daughter was in PT again. 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 And she did not see coming up here to have two sutures removed. She said that she knows to clean the area and keep a eye on the area to be sure that it will not get infected. I told that if she needed [place] to call up here and ask for [facility]. I tried to get her to come in

## 2013-01-29 ENCOUNTER — Encounter (INDEPENDENT_AMBULATORY_CARE_PROVIDER_SITE_OTHER): Payer: Medicare Other

## 2013-02-02 ENCOUNTER — Ambulatory Visit (INDEPENDENT_AMBULATORY_CARE_PROVIDER_SITE_OTHER): Payer: Medicare Other | Admitting: General Surgery

## 2013-02-02 ENCOUNTER — Encounter (INDEPENDENT_AMBULATORY_CARE_PROVIDER_SITE_OTHER): Payer: Self-pay | Admitting: General Surgery

## 2013-02-02 VITALS — BP 136/83 | HR 80 | Temp 98.2°F | Resp 16 | Ht 63.0 in | Wt 138.4 lb

## 2013-02-02 DIAGNOSIS — R1032 Left lower quadrant pain: Secondary | ICD-10-CM

## 2013-02-02 NOTE — Progress Notes (Signed)
Subjective:     Patient ID: Sherry [name], female   DOB: [birth year], 79 y.o.   MRN: [id number]  HPI This patient follows up for evaluation of left lower quadrant abdominal pain 3 months status post laparoscopic her stomal hernia repair. She has had significant discomfort after her hernia surgery and she says that she has some pain just inferior and lateral to her stoma which radiates to her back. She denies any nausea or vomiting but she has had 2 bowel obstructions since her surgery. She denies any abdominal distention. She says that her colostomy has been functioning normally.  Review of Systems     Objective:   Physical Exam No distress and nontoxic-appearing Her ostomy appears normal and functioning. I do not see any evidence of recurrent parastomal hernia on exam or with valsalva. She didn't have any significant tenderness to palpation.    Assessment:      left lower quadrant abdominal pain status post parastomal hernia repair  I do not appreciate any evidence of recurrent hernia on exam. This could be suture site pain or possible persistent seroma. This could be recurrent hernia as well, although I do not appreciate this on exam. She did not have signs or symptoms for bowel obstruction I have offered CT scan of the abdomen to evaluate for possible seroma or recurrent hernia.    Plan:     We will set her up for CT scan of the abdomen and she will follow up with her surgeon at the next available appointment after CT scan.

## 2013-02-04 ENCOUNTER — Telehealth: Payer: Self-pay | Admitting: Oncology

## 2013-02-04 NOTE — Telephone Encounter (Signed)
called pt and lvm regarding to r/s appt to April...mailed pt appt schedule for April

## 2013-02-05 ENCOUNTER — Ambulatory Visit
Admission: RE | Admit: 2013-02-05 | Discharge: 2013-02-05 | Disposition: A | Discharge status: Home / Self Care | Payer: Medicare Other | Source: Ambulatory Visit | Attending: General Surgery | Admitting: General Surgery

## 2013-02-05 DIAGNOSIS — I714 Abdominal aortic aneurysm, without rupture: Secondary | ICD-10-CM | POA: Diagnosis not present

## 2013-02-05 DIAGNOSIS — R1032 Left lower quadrant pain: Secondary | ICD-10-CM

## 2013-02-05 DIAGNOSIS — N2889 Other specified disorders of kidney and ureter: Secondary | ICD-10-CM | POA: Diagnosis not present

## 2013-02-05 MED ORDER — IOHEXOL 300 MG/ML  SOLN
100.0000 mL | Freq: Once | INTRAMUSCULAR | Status: AC | PRN
  Administered 2013-02-05: 100 mL via INTRAVENOUS

## 2013-02-06 ENCOUNTER — Telehealth (INDEPENDENT_AMBULATORY_CARE_PROVIDER_SITE_OTHER): Payer: Self-pay | Admitting: General Surgery

## 2013-02-06 NOTE — Telephone Encounter (Signed)
Called patient this morning and after talking with Dr Biagio Quint about her CT scan and told her that she did not have a hernia over her surgery site. But it did show a small 8 mm mid left renal lesion. I told her that will go over more of the CT when she comes in to see Dr [name] on 2-25 and she is ok with that

## 2013-02-08 ENCOUNTER — Other Ambulatory Visit: Payer: Self-pay | Admitting: Internal Medicine

## 2013-02-10 ENCOUNTER — Ambulatory Visit (INDEPENDENT_AMBULATORY_CARE_PROVIDER_SITE_OTHER): Payer: Medicare Other | Admitting: Surgery

## 2013-02-10 ENCOUNTER — Encounter (INDEPENDENT_AMBULATORY_CARE_PROVIDER_SITE_OTHER): Payer: Self-pay | Admitting: Surgery

## 2013-02-10 VITALS — BP 147/80 | HR 66 | Temp 97.0°F | Resp 14 | Ht 63.0 in | Wt 141.2 lb

## 2013-02-10 DIAGNOSIS — R1032 Left lower quadrant pain: Secondary | ICD-10-CM

## 2013-02-10 DIAGNOSIS — B079 Viral wart, unspecified: Secondary | ICD-10-CM | POA: Diagnosis not present

## 2013-02-10 DIAGNOSIS — R109 Unspecified abdominal pain: Secondary | ICD-10-CM

## 2013-02-10 DIAGNOSIS — R102 Pelvic and perineal pain: Secondary | ICD-10-CM | POA: Insufficient documentation

## 2013-02-10 NOTE — Progress Notes (Signed)
The patient comes in with complaints of left lower quadrant pain. Her symptoms seem to be more in the left suprapubic region. She is no longer tender near her colostomy from our parastomal hernia repair. She describes a recent episode where she had a lot of left suprapubic pain and had significant urinary frequency. She states that the pain is worst when her bladder is full and then seems to improve after urination. She is on Ditropan and has seen Dr. Marcine Matar of Urology in the past.  She had a recent urinalysis which was unremarkable. The urine culture was negative.  After her CT scan, she had large liquid stool output which was likely from the contrast material.  Filed Vitals:   02/10/13 1349  BP: 147/80  Pulse: 66  Temp: 97 F (36.1 C)  Resp: 14   Abd - soft, no sign of parastomal hernia Tender in left pelvic/ suprapubic region - no palpable masses   RADIOLOGY REPORT*  Clinical Data: Status post laparoscopic parastomal hernia repair 3  months ago, follow-up  CT ABDOMEN AND PELVIS WITH CONTRAST  Technique: Multidetector CT imaging of the abdomen and pelvis was  performed following the standard protocol during bolus  administration of intravenous contrast.  Contrast: OMNIPAQUE IOHEXOL 300 MG/ML SOLN  Comparison: CT abdomen pelvis of 08/04/2012  BUN and creatinine were obtained on site at [facility] at  [address]  Results: BUN 12 mg/dL, Creatinine 0.8 mg/dL.  Findings: The lung bases are clear, with a probable pleural plaque  medially at the right lung base abutting the hemidiaphragm. The  liver enhances with no focal abnormality, and no ductal dilatation  is seen. The gallbladder appears to have been resected. The  pancreas is normal in size and the pancreatic duct is not dilated.  The adrenal glands are stable and multiple calcified splenic  granulomas again are noted. The kidneys enhance with no calculus.  There is an 8 mm posterior mid left renal  lesion of 8 mm in  diameter. This may represent a complex cyst, but a small renal  cell carcinoma cannot be excluded and follow-up is recommended. On  delayed images, the pelvocaliceal systems are unremarkable. The  abdominal aorta is normal in caliber with moderate atheromatous  change present. An infrarenal mid abdominal aortic aneurysm is  noted of 3.0 cm in maximum diameter, unchanged compared to the  prior CT. No adenopathy is seen.  Repair of the prior para ostial hernia is noted with no evidence of  recurrent hernia. The urinary bladder is unremarkable. The uterus  has previously been resected. No adnexal lesion is seen. The  rectosigmoid colon has been resected. There is a moderate amount  of feces throughout the remaining colon. The terminal ileum is  unremarkable. Very mild anterolisthesis of L4 on L5 is noted of  approximately 3 mm.  IMPRESSION:  1. No evidence of recurrent para ostial hernia.  2. Moderate amount of feces throughout the remaining colon.  3. 3 mm anterolisthesis of L4 on L5 with mild degenerative disc  disease at L4-5.  4. 8 mm mid left renal lesion. Consider follow-up to exclude  small renal cell carcinoma.  5. Stable 3 cm mid abdominal aortic aneurysm.  Original Report Authenticated By: Dwyane Dee, M.D.  Imp:  Her tenderness seems to be more associated with the left side of her bladder. She has a history of bladder spasms on Ditropan. I would like to try her on an empiric course of antibiotics  to treat a possible bladder infection. If this does not improve then we will consider referral to urology.  The 8mm left renal lesion will be followed up with a repeat scan in 6 months.  Wilmon Arms. [name], MD, [facility]  02/10/2013 2:21 PM

## 2013-02-12 ENCOUNTER — Encounter: Payer: Self-pay | Admitting: Internal Medicine

## 2013-02-12 ENCOUNTER — Ambulatory Visit (INDEPENDENT_AMBULATORY_CARE_PROVIDER_SITE_OTHER): Payer: Medicare Other | Admitting: Internal Medicine

## 2013-02-12 VITALS — BP 120/84 | HR 78 | Temp 98.5°F | Resp 18 | Wt 140.0 lb

## 2013-02-12 DIAGNOSIS — E039 Hypothyroidism, unspecified: Secondary | ICD-10-CM | POA: Diagnosis not present

## 2013-02-12 DIAGNOSIS — J4489 Other specified chronic obstructive pulmonary disease: Secondary | ICD-10-CM

## 2013-02-12 DIAGNOSIS — C20 Malignant neoplasm of rectum: Secondary | ICD-10-CM

## 2013-02-12 DIAGNOSIS — J449 Chronic obstructive pulmonary disease, unspecified: Secondary | ICD-10-CM

## 2013-02-12 DIAGNOSIS — I10 Essential (primary) hypertension: Secondary | ICD-10-CM

## 2013-02-12 MED ORDER — DILTIAZEM HCL ER BEADS 180 MG PO CP24: 180.0000 mg | ORAL_CAPSULE | Freq: Every day | ORAL | Status: DC

## 2013-02-12 MED ORDER — METOPROLOL SUCCINATE ER 25 MG PO TB24: 12.5000 mg | ORAL_TABLET | Freq: Every day | ORAL | Status: DC

## 2013-02-12 MED ORDER — LEVOTHYROXINE SODIUM 75 MCG PO TABS: 75.0000 ug | ORAL_TABLET | Freq: Every day | ORAL | Status: DC

## 2013-02-12 NOTE — Patient Instructions (Signed)
Limit your sodium (Salt) intake  Return in 3 months for follow-up   

## 2013-02-12 NOTE — Progress Notes (Signed)
Subjective:    Patient ID: Sherry [name], female    DOB: [birth year], 79 y.o.   MRN: [id number]  HPI  79 year old patient who is seen today for followup. She is followed closely by general surgery following repair of a parastomal hernia in November of last year. She continues to have pain in the left hip and left lower quadrant regions. Pain seems aggravated by ambulation. She was seen by surgery 2 days ago and is being treated empirically for a possible UTI with antibiotic therapy. She has had a fairly recent followup pelvic CT. This revealed a small 8 mm renal lesion in followup scan in 6 months was recommended. She has treated hypertension which has done well  Past Medical History  Diagnosis Date  . CAD (coronary artery disease)   . Hyperlipidemia   . Hypertension   . Anemia   . COPD (chronic obstructive pulmonary disease)   . Headache   . Hypothyroidism   . Osteoporosis   . Shingles   . Arthritis   . Tendon adhesions     torn tendon right shoulder  . Rectal adenocarcinoma dx'd 08/2009  . Rectal cancer   . Shortness of breath   . GERD (gastroesophageal reflux disease)   . History of blood transfusion     History   Social History  . Marital Status: Married    Spouse Name: N/A    Number of Children: 3  . Years of Education: N/A   Occupational History  . Not on file.   Social History Main Topics  . Smoking status: Former Smoker -- 65 years    Quit date: 12/18/2003  . Smokeless tobacco: Never Used     Comment: quit 2005  . Alcohol Use: No  . Drug Use: No  . Sexually Active: No   Other Topics Concern  . Not on file   Social History Narrative  . No narrative on file    Past Surgical History  Procedure Laterality Date  . Appendectomy    . Cholecystectomy    . Vaginal hysterectomy    . Rectum removal      1/11 DR TSUEI  . Colon removal      8 " 12/2009 DR TSUEI  . Colon surgery      apr for rectal cancer 2011  . Cardiac catheterization    . Eye surgery      Cataract with lens- bil  . Colostomy    . Parastomal hernia repair  10/30/2012  . Ventral hernia repair  10/30/2012    Procedure: LAPAROSCOPIC VENTRAL HERNIA;  Surgeon: Wilmon Arms. [name], MD;  Location: [facility] OR;  Service: General;  Laterality: N/A;  Laparoscopic repair of parastomal hernia    Family History  Problem Relation Age of Onset  . Heart failure Father   . Cancer Mother     HEAD AND NECK  . Heart failure Sister   . Heart failure Brother   . Colon cancer Brother   . Colon cancer Brother   . Cervical cancer      Allergies  Allergen Reactions  . Percocet (Oxycodone-Acetaminophen) Other (See Comments)    Upsets stomach  . Influenza Virus Vacc Split Pf Other (See Comments)    Reaction unknown  . Sulfamethoxazole W-Trimethoprim Other (See Comments)    Reaction unknown    Current Outpatient Prescriptions on File Prior to Visit  Medication Sig Dispense Refill  . albuterol (PROVENTIL HFA;VENTOLIN HFA) 108 (90 BASE) MCG/ACT inhaler Inhale 2 puffs into the lungs  every 4 (four) hours as needed. For shortness of breath      . atorvastatin (LIPITOR) 10 MG tablet Take 10 mg by mouth daily.      Marland Kitchen oxybutynin (DITROPAN-XL) 10 MG 24 hr tablet Take 10 mg by mouth daily.      . traMADol-acetaminophen (ULTRACET) 37.5-325 MG per tablet Take 1 tablet by mouth every 6 (six) hours as needed. For pain       No current facility-administered medications on file prior to visit.    BP 120/84  Pulse 78  Temp(Src) 98.5 F (36.9 C) (Oral)  Resp 18  Wt 140 lb (63.504 kg)  BMI 24.81 kg/m2  SpO2 94%     Review of Systems  Constitutional: Negative.   HENT: Negative for hearing loss, congestion, sore throat, rhinorrhea, dental problem, sinus pressure and tinnitus.   Eyes: Negative for pain, discharge and visual disturbance.  Respiratory: Negative for cough and shortness of breath.   Cardiovascular: Negative for chest pain, palpitations and leg swelling.  Gastrointestinal: Positive for  abdominal pain. Negative for nausea, vomiting, diarrhea, constipation, blood in stool and abdominal distention.  Genitourinary: Negative for dysuria, urgency, frequency, hematuria, flank pain, vaginal bleeding, vaginal discharge, difficulty urinating, vaginal pain and pelvic pain.  Musculoskeletal: Negative for joint swelling, arthralgias and gait problem.  Skin: Negative for rash.  Neurological: Negative for dizziness, syncope, speech difficulty, weakness, numbness and headaches.  Hematological: Negative for adenopathy.  Psychiatric/Behavioral: Negative for behavioral problems, dysphoric mood and agitation. The patient is not nervous/anxious.        Objective:   Physical Exam  Constitutional: She is oriented to person, place, and time. She appears well-developed and well-nourished.  HENT:  Head: Normocephalic.  Right Ear: External ear normal.  Left Ear: External ear normal.  Mouth/Throat: Oropharynx is clear and moist.  Eyes: Conjunctivae and EOM are normal. Pupils are equal, round, and reactive to light.  Neck: Normal range of motion. Neck supple. No thyromegaly present.  Cardiovascular: Normal rate, regular rhythm, normal heart sounds and intact distal pulses.   Pulmonary/Chest: Effort normal and breath sounds normal.  Abdominal: Soft. Bowel sounds are normal. She exhibits no mass. There is tenderness.  Tenderness in the left lower quadrant and about the colostomy  Musculoskeletal: Normal range of motion.  Flexion a left hip and internal rotation caused the hip pain that seemed to reproduce  her abdominal discomfort  Lymphadenopathy:    She has no cervical adenopathy.  Neurological: She is alert and oriented to person, place, and time.  Skin: Skin is warm and dry. No rash noted.  Psychiatric: She has a normal mood and affect. Her behavior is normal.          Assessment & Plan:   Hypertension well controlled History rectal cancer Left lower quadrant and left hip pain. We'll  continue analgesics 8 mm renal mass. Followup CT in 6 months

## 2013-02-13 ENCOUNTER — Telehealth: Payer: Self-pay | Admitting: Internal Medicine

## 2013-02-13 NOTE — Telephone Encounter (Signed)
Patient called stating that she need a refill of her lipitor 10 mg 1poqd [facility] Please assist.

## 2013-02-16 MED ORDER — ATORVASTATIN CALCIUM 10 MG PO TABS: 10.0000 mg | ORAL_TABLET | Freq: Every day | ORAL | Status: DC

## 2013-02-16 NOTE — Telephone Encounter (Signed)
Spoke to pt told her Rx for Lipitor [facility]. Pt verbalized understanding.

## 2013-02-19 ENCOUNTER — Other Ambulatory Visit (INDEPENDENT_AMBULATORY_CARE_PROVIDER_SITE_OTHER): Payer: Self-pay | Admitting: Surgery

## 2013-02-19 ENCOUNTER — Telehealth (INDEPENDENT_AMBULATORY_CARE_PROVIDER_SITE_OTHER): Payer: Self-pay | Admitting: General Surgery

## 2013-02-19 DIAGNOSIS — R1030 Lower abdominal pain, unspecified: Secondary | ICD-10-CM

## 2013-02-19 DIAGNOSIS — R3989 Other symptoms and signs involving the genitourinary system: Secondary | ICD-10-CM

## 2013-02-19 NOTE — Telephone Encounter (Signed)
Called patient back and she was stating that she is still having pain on the same side and I told patient

## 2013-02-19 NOTE — Telephone Encounter (Signed)
Called patient back to let her know that I spoke with Dr [name] about the pain that she is still having on the same side. Patient stated she is on her last two antibodies one tonight and the last one in the am.Dr Tsuei wants patient to go and see Dr Retta Diones for the pain in the groin and the lower left side. Patient is going  to be seen on 02-25-2013 @ 2:30 she will be seeing the PA and patient is ok with that. Dr Retta Diones first avail is in May. Patient stated that she is no running any fever or no blood. I faxed all of Dr [name] notes to Dr Retta Diones office

## 2013-02-25 DIAGNOSIS — N3941 Urge incontinence: Secondary | ICD-10-CM | POA: Diagnosis not present

## 2013-03-11 ENCOUNTER — Other Ambulatory Visit: Payer: Medicare Other | Admitting: Lab

## 2013-03-11 ENCOUNTER — Ambulatory Visit: Payer: Medicare Other | Admitting: Oncology

## 2013-04-01 ENCOUNTER — Other Ambulatory Visit: Payer: Self-pay | Admitting: Oncology

## 2013-04-01 ENCOUNTER — Ambulatory Visit (HOSPITAL_BASED_OUTPATIENT_CLINIC_OR_DEPARTMENT_OTHER): Payer: Medicare Other | Admitting: Oncology

## 2013-04-01 ENCOUNTER — Other Ambulatory Visit (HOSPITAL_BASED_OUTPATIENT_CLINIC_OR_DEPARTMENT_OTHER): Payer: Medicare Other | Admitting: Lab

## 2013-04-01 ENCOUNTER — Telehealth: Payer: Self-pay | Admitting: Oncology

## 2013-04-01 VITALS — BP 149/69 | HR 61 | Temp 98.8°F | Resp 20 | Ht 63.0 in | Wt 142.8 lb

## 2013-04-01 DIAGNOSIS — C20 Malignant neoplasm of rectum: Secondary | ICD-10-CM

## 2013-04-01 LAB — CBC WITH DIFFERENTIAL/PLATELET
Basophils Absolute: 0 10*3/uL (ref 0.0–0.1)
Eosinophils Absolute: 0.1 10*3/uL (ref 0.0–0.5)
HGB: 12.4 g/dL (ref 11.6–15.9)
MCV: 88.8 fL (ref 79.5–101.0)
MONO%: 9.7 % (ref 0.0–14.0)
NEUT#: 3.9 10*3/uL (ref 1.5–6.5)
Platelets: 222 10*3/uL (ref 145–400)
RDW: 15 % — ABNORMAL HIGH (ref 11.2–14.5)

## 2013-04-01 LAB — COMPREHENSIVE METABOLIC PANEL (CC13)
Albumin: 3.5 g/dL (ref 3.5–5.0)
Alkaline Phosphatase: 130 U/L (ref 40–150)
BUN: 15.2 mg/dL (ref 7.0–26.0)
Glucose: 121 mg/dl — ABNORMAL HIGH (ref 70–99)
Total Bilirubin: 0.4 mg/dL (ref 0.20–1.20)

## 2013-04-01 NOTE — Progress Notes (Signed)
Hematology and Oncology Follow Up Visit  Sherry [name] [id number] 01-Dec-1923 79 y.o. 04/01/2013 3:38 PM  Sherry Chiquito, MD  Rob Bunting, MD  Manus Rudd, MD  Antony Blackbird, MD, PhD (Radiation Oncology)  Principle Diagnosis:  This is an 79 year old female diagnosed with a T3 N0 rectal adenocarcinoma by EUS criteria in September 2010.  She did have subsequently a T2 N1 stage III rectal cancer.    Prior Therapy:  1.  She received neoadjuvant therapy concomitantly with Xeloda, therapy concluded on October 31, 2010.  She received total 5040 cGy in 28 fractions. 2.  The patient received abdominoperineal resection done December 27, 2009.  She had a T2 N1 with 1 out of 9 lymph nodes involved. 3.  The patient received two cycles of adjuvant FOLFOX chemotherapy, last given was March 2011.  The patient discontinued chemotherapy due to her wishes and due to her intolerance with the small bowel obstruction that was recurrent.  Current therapy: Surveillance and watchful observation.  Interim History:  [name] presents today for an office follow-up visit.  Her daughter accompanies her.  This is a pleasant 79 year old female with stage III rectal cancer.  She has had neoadjuvant therapy followed by surgical resection, but again stopped chemotherapy prematurely due to poor tolerance.  She did have recurrent small bowel obstructions, that required multiple hospitalizations.  She is currently on active surveillance without any evidence to suggest any recurrent disease.  She has not reported any major complaints.  She does not report any nausea, vomiting, diarrhea, constipation.  She does not report any melena, hematochezia or hematuria, fevers, chills or night sweats.  She is eating and drinking quite well. She is reporting pelvic discomfort and associated bladder spasms which has improved.  She is S/P hernia repair in 01/2013 and doing well from that stand point.    Medications: I have reviewed the  patient's current medications. Current outpatient prescriptions:albuterol (PROVENTIL HFA;VENTOLIN HFA) 108 (90 BASE) MCG/ACT inhaler, Inhale 2 puffs into the lungs every 4 (four) hours as needed. For shortness of breath, Disp: , Rfl: ;  atorvastatin (LIPITOR) 10 MG tablet, Take 1 tablet (10 mg total) by mouth daily., Disp: 90 tablet, Rfl: 3;  diltiazem (TIAZAC) 180 MG 24 hr capsule, Take 1 capsule (180 mg total) by mouth daily., Disp: 90 capsule, Rfl: 1 levothyroxine (SYNTHROID, LEVOTHROID) 75 MCG tablet, Take 1 tablet (75 mcg total) by mouth daily., Disp: 90 tablet, Rfl: 1;  metoprolol succinate (TOPROL-XL) 25 MG 24 hr tablet, Take 0.5 tablets (12.5 mg total) by mouth daily. 1/2 daily, Disp: 90 tablet, Rfl: 1;  oxybutynin (DITROPAN-XL) 10 MG 24 hr tablet, Take 10 mg by mouth daily., Disp: , Rfl:  traMADol-acetaminophen (ULTRACET) 37.5-325 MG per tablet, Take 1 tablet by mouth every 6 (six) hours as needed. For pain, Disp: , Rfl:   Allergies:  Allergies  Allergen Reactions  . Percocet (Oxycodone-Acetaminophen) Other (See Comments)    Upsets stomach  . Influenza Virus Vacc Split Pf Other (See Comments)    Reaction unknown  . Sulfamethoxazole W-Trimethoprim Other (See Comments)    Reaction unknown    Past Medical History, Surgical history, Social history, and Family History were reviewed and updated.  Review of Systems: Constitutional:  Negative for fever, chills, night sweats, anorexia, weight loss, pain. Cardiovascular: no chest pain or dyspnea on exertion Respiratory: no cough, shortness of breath, or wheezing Neurological: no TIA or stroke symptoms Dermatological: negative ENT: negative Skin: Negative. Gastrointestinal: no abdominal pain, change in bowel habits,  or black or bloody stools Genito-Urinary: positive for - change in urinary stream, dysuria and pelvic pain Hematological and Lymphatic: negative Breast: negative Musculoskeletal: negative Remaining ROS negative. Physical  Exam: Blood pressure 149/69, pulse 61, temperature 98.8 F (37.1 C), temperature source Oral, resp. rate 20, height 5\' 3"  (1.6 m), weight 142 lb 12.8 oz (64.774 kg). ECOG: 1 General appearance: alert Head: Normocephalic, without obvious abnormality, atraumatic Neck: no adenopathy, no carotid bruit, no JVD, supple, symmetrical, trachea midline and thyroid not enlarged, symmetric, no tenderness/mass/nodules Lymph nodes: Cervical, supraclavicular, and axillary nodes normal. Heart:regular rate and rhythm, S1, S2 normal, no murmur, click, rub or gallop Lung:chest clear, no wheezing, rales, normal symmetric air entry Abdomin: soft, non-tender, without masses or organomegaly. Bulge noted around the colostomy.  EXT:no erythema, induration, or nodules   Lab Results: Lab Results  Component Value Date   WBC 5.2 04/01/2013   HGB 12.4 04/01/2013   HCT 37.5 04/01/2013   MCV 88.8 04/01/2013   PLT 222 04/01/2013     Chemistry      Component Value Date/Time   NA 138 11/01/2012 0650   NA 140 08/04/2012 0949   K 4.4 11/01/2012 0650   K 4.1 08/04/2012 0949   CL 107 11/01/2012 0650   CL 100 08/04/2012 0949   CO2 26 11/01/2012 0650   CO2 30 08/04/2012 0949   BUN 7 11/01/2012 0650   BUN 16 08/04/2012 0949   CREATININE 0.64 11/01/2012 0650   CREATININE 0.8 08/04/2012 0949      Component Value Date/Time   CALCIUM 10.0 11/01/2012 0650   CALCIUM 9.8 08/04/2012 0949   ALKPHOS 131* 08/04/2012 0949   ALKPHOS 126* 04/03/2012 0919   AST 20 08/04/2012 0949   AST 14 04/03/2012 0919   ALT 11 04/03/2012 0919   BILITOT 0.60 08/04/2012 0949   BILITOT 0.4 04/03/2012 0919       Impression and Plan: This is an 79 year old female with the following issues; 1.  Stage III colorectal cancer, she is status post surgical resection in January 2011.  She did not tolerate the course of adjuvant chemotherapy.  She is currently on observation and surveillance.  Her last restaging CT scan from 01/2013 did not showed no cancer  recurence. 2.  Recurrent small bowel obstruction, that currently has resolved.  No new symptoms. 3.  Surveillance colonoscopies: She should be due in 2013 or 2014. 4.  Abdominal  hernia. She is S/P repair and doing well.  5.  Follow up: 6 months    Ahmyah Gidley, MD 4/16/20143:38 PM

## 2013-04-28 ENCOUNTER — Ambulatory Visit (INDEPENDENT_AMBULATORY_CARE_PROVIDER_SITE_OTHER): Payer: Medicare Other | Admitting: Internal Medicine

## 2013-04-28 ENCOUNTER — Encounter: Payer: Self-pay | Admitting: Internal Medicine

## 2013-04-28 VITALS — BP 130/70 | HR 63 | Temp 98.0°F | Resp 20 | Wt 143.0 lb

## 2013-04-28 DIAGNOSIS — E785 Hyperlipidemia, unspecified: Secondary | ICD-10-CM

## 2013-04-28 DIAGNOSIS — J069 Acute upper respiratory infection, unspecified: Secondary | ICD-10-CM

## 2013-04-28 DIAGNOSIS — E039 Hypothyroidism, unspecified: Secondary | ICD-10-CM

## 2013-04-28 DIAGNOSIS — J209 Acute bronchitis, unspecified: Secondary | ICD-10-CM

## 2013-04-28 DIAGNOSIS — C20 Malignant neoplasm of rectum: Secondary | ICD-10-CM

## 2013-04-28 LAB — TSH: TSH: 1.66 u[IU]/mL (ref 0.35–5.50)

## 2013-04-28 LAB — LIPID PANEL
Cholesterol: 122 mg/dL (ref 0–200)
HDL: 37 mg/dL — ABNORMAL LOW (ref >39.00)
Triglycerides: 140 mg/dL (ref 0.0–149.0)
VLDL: 28 mg/dL (ref 0.0–40.0)

## 2013-04-28 NOTE — Progress Notes (Signed)
  Subjective:    Patient ID: Sherry [name], female    DOB: 08-24-1923, 79 y.o.   MRN: [id number]  HPI  79 year old patient who is followed closely by oncology with a history of rectal adenocarcinoma. For the past several days she's had increasing cough especially through the night. Cough has been nonproductive. She has been ill for about one week with the malaise chest congestion slight hoarseness but no fever chills or shortness of breath. No wheezing.  Wt Readings from Last 3 Encounters:  04/28/13 143 lb (64.864 kg)  04/01/13 142 lb 12.8 oz (64.774 kg)  02/12/13 140 lb (63.504 kg)    Review of Systems  Constitutional: Positive for fatigue.  HENT: Negative for hearing loss, congestion, sore throat, rhinorrhea, dental problem, sinus pressure and tinnitus.   Eyes: Negative for pain, discharge and visual disturbance.  Respiratory: Positive for cough. Negative for shortness of breath.   Cardiovascular: Negative for chest pain, palpitations and leg swelling.  Gastrointestinal: Negative for nausea, vomiting, abdominal pain, diarrhea, constipation, blood in stool and abdominal distention.  Genitourinary: Negative for dysuria, urgency, frequency, hematuria, flank pain, vaginal bleeding, vaginal discharge, difficulty urinating, vaginal pain and pelvic pain.  Musculoskeletal: Negative for joint swelling, arthralgias and gait problem.  Skin: Negative for rash.  Neurological: Negative for dizziness, syncope, speech difficulty, weakness, numbness and headaches.  Hematological: Negative for adenopathy.  Psychiatric/Behavioral: Negative for behavioral problems, dysphoric mood and agitation. The patient is not nervous/anxious.        Objective:   Physical Exam  Constitutional: She is oriented to person, place, and time. She appears well-developed and well-nourished. No distress.  Clinically looks very good Normal blood pressure Pulse 63 O2 saturation 95% Afebrile  HENT:  Head: Normocephalic.   Right Ear: External ear normal.  Left Ear: External ear normal.  Mouth/Throat: Oropharynx is clear and moist.  Eyes: Conjunctivae and EOM are normal. Pupils are equal, round, and reactive to light.  Neck: Normal range of motion. Neck supple. No thyromegaly present.  Cardiovascular: Normal rate, regular rhythm, normal heart sounds and intact distal pulses.   Pulmonary/Chest: Effort normal and breath sounds normal. No respiratory distress. She has no wheezes. She has no rales.  Abdominal: Soft. Bowel sounds are normal. She exhibits no mass. There is no tenderness.  Musculoskeletal: Normal range of motion.  Lymphadenopathy:    She has no cervical adenopathy.  Neurological: She is alert and oriented to person, place, and time.  Skin: Skin is warm and dry. No rash noted.  Psychiatric: She has a normal mood and affect. Her behavior is normal.          Assessment & Plan:  Viral URI with cough. We'll treat symptomatically Hypertension stable Dyslipidemia. No recent lab will check a lipid panel Hypothyroidism.  will check a TSH

## 2013-04-28 NOTE — Patient Instructions (Signed)
Acute bronchitis symptoms for less than 10 days are generally not helped by antibiotics.  Take over-the-counter expectorants and cough medications such as  Mucinex DM.  Call if there is no improvement in 5 to 7 days or if he developed worsening cough, fever, or new symptoms, such as shortness of breath or chest pain.    

## 2013-05-12 ENCOUNTER — Ambulatory Visit: Payer: Medicare Other | Admitting: Internal Medicine

## 2013-05-28 ENCOUNTER — Encounter: Payer: Self-pay | Admitting: Internal Medicine

## 2013-05-28 ENCOUNTER — Telehealth: Payer: Self-pay | Admitting: Internal Medicine

## 2013-05-28 ENCOUNTER — Ambulatory Visit (INDEPENDENT_AMBULATORY_CARE_PROVIDER_SITE_OTHER): Payer: Medicare Other | Admitting: Internal Medicine

## 2013-05-28 VITALS — BP 130/90 | HR 72 | Temp 98.5°F | Resp 20 | Wt 140.0 lb

## 2013-05-28 DIAGNOSIS — I10 Essential (primary) hypertension: Secondary | ICD-10-CM | POA: Diagnosis not present

## 2013-05-28 DIAGNOSIS — J069 Acute upper respiratory infection, unspecified: Secondary | ICD-10-CM | POA: Diagnosis not present

## 2013-05-28 DIAGNOSIS — J449 Chronic obstructive pulmonary disease, unspecified: Secondary | ICD-10-CM | POA: Diagnosis not present

## 2013-05-28 MED ORDER — PREDNISONE 10 MG PO TABS: 10.0000 mg | ORAL_TABLET | Freq: Two times a day (BID) | ORAL | Status: DC

## 2013-05-28 MED ORDER — AZITHROMYCIN 250 MG PO TABS: ORAL_TABLET | ORAL | Status: DC

## 2013-05-28 NOTE — Patient Instructions (Signed)
Take your antibiotic as prescribed until ALL of it is gone, but stop if you develop a rash, swelling, or any side effects of the medication.  Contact our office as soon as possible if  there are side effects of the medication.  Pulmicort 2 puffs twice daily  Albuterol 2 puffs every 6 hours as needed  Continue Mucinex DM

## 2013-05-28 NOTE — Telephone Encounter (Signed)
[facility] Triage Call Report Triage Record Num: [id number] Operator: Maryfrances Bunnell Patient Name: Sherry [name] Call Date & Time: 05/27/2013 5:25:58PM Patient Phone: [phone] PCP: Gordy Savers Patient Gender: Female PCP Fax : [phone] Patient DOB: Jan 17, 1923 [facility] Name[facility] Reason for Call: Caller: Jeanetta/Other; PCP: [name] (Family Practice); CB#: [phone]; Call regarding Cough/Congestion; Onset 2 weeks ago; Seen in office last month (May); Used Mucinex but not helping with this round; Cough is productive, white and thick mucous; Runny nose; Wheezing; Afebrile; Per Upper Respiratory Infection Protocol all emergent symptoms ruled out with exception of "Previous call within last week for similar symptoms and has followed recommended self care measures for a week but symptoms have not improved or symptoms have worsened." Home care advice given. Appt scheduled for 05/28/13 1015 am. Protocol(s) Used: Upper Respiratory Infection (URI) Recommended Outcome per Protocol: See Provider within 24 hours Reason for Outcome: Previous call within last week for similar symptoms AND has followed recommended self care measures for a week but symptoms have not improved or symptoms have worsened. Care Advice: Call provider if headache worsens, develops temperature greater than 101.66F (38.1C) or any temperature elevation in a geriatric or immunocompromised patient (such as diabetes, HIV/AIDS, chemotherapy, organ transplant, or chronic steroid use). ~ Try to identify possible situations or irritants that triggered your symptoms (including medications) and be sure to tell your provider. ~ Drink more fluids -- water, low-sugar juices, tea and warm soup, especially chicken broth, are options. Avoid caffeinated or alcoholic beverages because they can increase the chance of dehydration. ~ ~ Tell your provider about any other symptoms that you are  currently experiencing or any that have concerned you. Call provider immediately if develop temperature 101.5 F (38.6 C) or higher, chest pain with each breath, or productive cough with yellow-green mucus. ~ ~ SYMPTOM / CONDITION MANAGEMENT Go to the ED if new onset of stiff neck (unable to touch chin to chest), generalized headache, change in mental status, difficulty opening mouth, unable to swallow liquids or signs of dehydration. ~ Total water intake includes drinking water, water in beverages, and water contained in food. Fluids make up about 80% of the body's total hydration need. Individual fluid requirement to maintain hydration vary based on physical activity, environmental factors and illness. Limit fluids that contain sugar, caffeine, or alcohol. Urine will be very light yellow color when you drink enough fluids. ~ 05/27/2013 5:36:55PM Page 1 of 1 [id number]

## 2013-05-28 NOTE — Progress Notes (Signed)
Subjective:    Patient ID: Sherry [name], female    DOB: 07/16/1923, 79 y.o.   MRN: [id number]  HPI  79 year old patient who has a history of COPD. She has been ill for about 2 weeks with the chest congestion and productive cough. She has had some intermittent wheezing but has required the albuterol use. There's been occasional low-grade fever. Sputum production has been mild to modest. She has been using Mucinex DM.  Past Medical History  Diagnosis Date  . CAD (coronary artery disease)   . Hyperlipidemia   . Hypertension   . Anemia   . COPD (chronic obstructive pulmonary disease)   . Headache(784.0)   . Hypothyroidism   . Osteoporosis   . Shingles   . Arthritis   . Tendon adhesions     torn tendon right shoulder  . Rectal adenocarcinoma dx'd 08/2009  . Rectal cancer   . Shortness of breath   . GERD (gastroesophageal reflux disease)   . History of blood transfusion     History   Social History  . Marital Status: Married    Spouse Name: N/A    Number of Children: 3  . Years of Education: N/A   Occupational History  . Not on file.   Social History Main Topics  . Smoking status: Former Smoker -- 65 years    Quit date: 12/18/2003  . Smokeless tobacco: Never Used     Comment: quit 2005  . Alcohol Use: No  . Drug Use: No  . Sexually Active: No   Other Topics Concern  . Not on file   Social History Narrative  . No narrative on file    Past Surgical History  Procedure Laterality Date  . Appendectomy    . Cholecystectomy    . Vaginal hysterectomy    . Rectum removal      1/11 DR TSUEI  . Colon removal      8 " 12/2009 DR TSUEI  . Colon surgery      apr for rectal cancer 2011  . Cardiac catheterization    . Eye surgery      Cataract with lens- bil  . Colostomy    . Parastomal hernia repair  10/30/2012  . Ventral hernia repair  10/30/2012    Procedure: LAPAROSCOPIC VENTRAL HERNIA;  Surgeon: Wilmon Arms. [name], MD;  Location: [facility] OR;  Service: General;   Laterality: N/A;  Laparoscopic repair of parastomal hernia    Family History  Problem Relation Age of Onset  . Heart failure Father   . Cancer Mother     HEAD AND NECK  . Heart failure Sister   . Heart failure Brother   . Colon cancer Brother   . Colon cancer Brother   . Cervical cancer      Allergies  Allergen Reactions  . Percocet (Oxycodone-Acetaminophen) Other (See Comments)    Upsets stomach  . Influenza Virus Vacc Split Pf Other (See Comments)    Reaction unknown  . Sulfamethoxazole W-Trimethoprim Other (See Comments)    Reaction unknown    Current Outpatient Prescriptions on File Prior to Visit  Medication Sig Dispense Refill  . albuterol (PROVENTIL HFA;VENTOLIN HFA) 108 (90 BASE) MCG/ACT inhaler Inhale 2 puffs into the lungs every 4 (four) hours as needed. For shortness of breath      . atorvastatin (LIPITOR) 10 MG tablet Take 1 tablet (10 mg total) by mouth daily.  90 tablet  3  . diltiazem (TIAZAC) 180 MG 24 hr capsule  Take 1 capsule (180 mg total) by mouth daily.  90 capsule  1  . levothyroxine (SYNTHROID, LEVOTHROID) 75 MCG tablet Take 1 tablet (75 mcg total) by mouth daily.  90 tablet  1  . metoprolol succinate (TOPROL-XL) 25 MG 24 hr tablet Take 0.5 tablets (12.5 mg total) by mouth daily. 1/2 daily  90 tablet  1  . oxybutynin (DITROPAN-XL) 10 MG 24 hr tablet Take 10 mg by mouth daily.      . traMADol-acetaminophen (ULTRACET) 37.5-325 MG per tablet Take 1 tablet by mouth every 6 (six) hours as needed. For pain       No current facility-administered medications on file prior to visit.    BP 130/90  Pulse 72  Temp(Src) 98.5 F (36.9 C) (Oral)  Resp 20  Wt 140 lb (63.504 kg)  BMI 24.81 kg/m2  SpO2 95%       Review of Systems  Constitutional: Positive for fever, activity change, appetite change, fatigue and unexpected weight change.  HENT: Negative for hearing loss, congestion, sore throat, rhinorrhea, dental problem, sinus pressure and tinnitus.    Eyes: Negative for pain, discharge and visual disturbance.  Respiratory: Positive for cough and wheezing. Negative for shortness of breath.   Cardiovascular: Negative for chest pain, palpitations and leg swelling.  Gastrointestinal: Negative for nausea, vomiting, abdominal pain, diarrhea, constipation, blood in stool and abdominal distention.  Genitourinary: Negative for dysuria, urgency, frequency, hematuria, flank pain, vaginal bleeding, vaginal discharge, difficulty urinating, vaginal pain and pelvic pain.  Musculoskeletal: Negative for joint swelling, arthralgias and gait problem.  Skin: Negative for rash.  Neurological: Negative for dizziness, syncope, speech difficulty, weakness, numbness and headaches.  Hematological: Negative for adenopathy.  Psychiatric/Behavioral: Negative for behavioral problems, dysphoric mood and agitation. The patient is not nervous/anxious.        Objective:   Physical Exam  Constitutional: She is oriented to person, place, and time. She appears well-developed and well-nourished.  HENT:  Head: Normocephalic.  Right Ear: External ear normal.  Left Ear: External ear normal.  Mouth/Throat: Oropharynx is clear and moist.  Eyes: Conjunctivae and EOM are normal. Pupils are equal, round, and reactive to light.  Neck: Normal range of motion. Neck supple. No thyromegaly present.  Cardiovascular: Normal rate, regular rhythm, normal heart sounds and intact distal pulses.   Pulmonary/Chest: Effort normal.  Few scattered rhonchi. No active wheezing. O2 saturation 95% No increased work of breathing  Abdominal: Soft. Bowel sounds are normal. She exhibits no mass. There is no tenderness.  Musculoskeletal: Normal range of motion.  Lymphadenopathy:    She has no cervical adenopathy.  Neurological: She is alert and oriented to person, place, and time.  Skin: Skin is warm and dry. No rash noted.  Psychiatric: She has a normal mood and affect. Her behavior is normal.           Assessment & Plan:   Acute  exacerbation of COPD;  will treat with azithromycin brief of pulse dose of prednisone. We'll continue expectorants and albuterol use as needed Hypertension stable

## 2013-07-17 ENCOUNTER — Encounter (INDEPENDENT_AMBULATORY_CARE_PROVIDER_SITE_OTHER): Payer: Medicare Other | Admitting: Surgery

## 2013-07-17 ENCOUNTER — Other Ambulatory Visit (INDEPENDENT_AMBULATORY_CARE_PROVIDER_SITE_OTHER): Payer: Self-pay | Admitting: Surgery

## 2013-07-17 DIAGNOSIS — K921 Melena: Secondary | ICD-10-CM

## 2013-07-17 DIAGNOSIS — IMO0002 Reserved for concepts with insufficient information to code with codable children: Secondary | ICD-10-CM

## 2013-07-22 ENCOUNTER — Other Ambulatory Visit: Payer: Self-pay

## 2013-07-26 ENCOUNTER — Other Ambulatory Visit (INDEPENDENT_AMBULATORY_CARE_PROVIDER_SITE_OTHER): Payer: Self-pay | Admitting: Surgery

## 2013-07-26 DIAGNOSIS — N289 Disorder of kidney and ureter, unspecified: Secondary | ICD-10-CM

## 2013-07-27 ENCOUNTER — Other Ambulatory Visit (INDEPENDENT_AMBULATORY_CARE_PROVIDER_SITE_OTHER): Payer: Self-pay | Admitting: Surgery

## 2013-07-27 ENCOUNTER — Telehealth (INDEPENDENT_AMBULATORY_CARE_PROVIDER_SITE_OTHER): Payer: Self-pay | Admitting: *Deleted

## 2013-07-27 ENCOUNTER — Other Ambulatory Visit (INDEPENDENT_AMBULATORY_CARE_PROVIDER_SITE_OTHER): Payer: Self-pay

## 2013-07-27 DIAGNOSIS — N289 Disorder of kidney and ureter, unspecified: Secondary | ICD-10-CM

## 2013-07-27 DIAGNOSIS — I714 Abdominal aortic aneurysm, without rupture: Secondary | ICD-10-CM | POA: Diagnosis not present

## 2013-07-27 LAB — BUN: BUN: 14 mg/dL (ref 6–23)

## 2013-07-27 NOTE — Telephone Encounter (Signed)
Received a call from [place] in [place] regarding the CT order.  She states that because patient has a renal lesion they prefer to do with and without.  Spoke to Dr. [name] who said that is fine.  [name] MA is working on placing the changed orders at this time.  Diannia Ruder with CT updated at this time.

## 2013-07-30 ENCOUNTER — Other Ambulatory Visit (INDEPENDENT_AMBULATORY_CARE_PROVIDER_SITE_OTHER): Payer: Self-pay | Admitting: Surgery

## 2013-07-30 ENCOUNTER — Ambulatory Visit
Admission: RE | Admit: 2013-07-30 | Discharge: 2013-07-30 | Disposition: A | Discharge status: Home / Self Care | Payer: Medicare Other | Source: Ambulatory Visit | Attending: Surgery | Admitting: Surgery

## 2013-07-30 DIAGNOSIS — N2889 Other specified disorders of kidney and ureter: Secondary | ICD-10-CM

## 2013-07-30 DIAGNOSIS — N289 Disorder of kidney and ureter, unspecified: Secondary | ICD-10-CM

## 2013-07-30 MED ORDER — IOHEXOL 350 MG/ML SOLN
100.0000 mL | Freq: Once | INTRAVENOUS | Status: AC | PRN
  Administered 2013-07-30: 100 mL via INTRAVENOUS

## 2013-07-31 ENCOUNTER — Telehealth: Payer: Self-pay | Admitting: *Deleted

## 2013-07-31 ENCOUNTER — Telehealth (INDEPENDENT_AMBULATORY_CARE_PROVIDER_SITE_OTHER): Payer: Self-pay | Admitting: General Surgery

## 2013-07-31 ENCOUNTER — Telehealth (INDEPENDENT_AMBULATORY_CARE_PROVIDER_SITE_OTHER): Payer: Self-pay

## 2013-07-31 NOTE — Telephone Encounter (Signed)
Pt calling to see if we have gotten appt for her with [facility]. Pt advised the referral is in epic and I will send msg to [facility] for review. Pt can be reached at home #.

## 2013-07-31 NOTE — Telephone Encounter (Signed)
Called Mrs Homen back and told her once I hear back from [place] from [facility] I will give her a call

## 2013-07-31 NOTE — Telephone Encounter (Signed)
Patient calling to say she had a CT scan yesterday, that was abnormal and wants to know if she should see dr Clelia Croft sooner than 10/01/13. Note to dr Alver Fisher desk

## 2013-07-31 NOTE — Telephone Encounter (Signed)
Per dr Clelia Croft scans unchanged and keep regularly scheduled appt. Patient verbalizes understanding.

## 2013-08-07 DIAGNOSIS — N289 Disorder of kidney and ureter, unspecified: Secondary | ICD-10-CM | POA: Diagnosis not present

## 2013-08-07 DIAGNOSIS — R3915 Urgency of urination: Secondary | ICD-10-CM | POA: Diagnosis not present

## 2013-08-14 ENCOUNTER — Other Ambulatory Visit: Payer: Self-pay | Admitting: Internal Medicine

## 2013-08-19 ENCOUNTER — Encounter: Payer: Self-pay | Admitting: Gastroenterology

## 2013-08-19 ENCOUNTER — Ambulatory Visit (INDEPENDENT_AMBULATORY_CARE_PROVIDER_SITE_OTHER): Payer: Medicare Other | Admitting: Gastroenterology

## 2013-08-19 VITALS — BP 130/70 | HR 60 | Ht 63.0 in | Wt 139.0 lb

## 2013-08-19 DIAGNOSIS — Z86010 Personal history of colonic polyps: Secondary | ICD-10-CM | POA: Diagnosis not present

## 2013-08-19 DIAGNOSIS — IMO0002 Reserved for concepts with insufficient information to code with codable children: Secondary | ICD-10-CM

## 2013-08-19 DIAGNOSIS — K9401 Colostomy hemorrhage: Secondary | ICD-10-CM

## 2013-08-19 MED ORDER — MOVIPREP 100 G PO SOLR: 1.0000 | Freq: Once | ORAL | Status: DC

## 2013-08-19 NOTE — Progress Notes (Signed)
Review of pertinent gastrointestinal problems:  1. Rectal cancer Diagnosed by incomplete colonoscopy at outside facility August 2010. Endoscopic ultrasound by me September 2010 showed a T3N0 rectal adenocarcinoma; She underwent neoadjuvant chemotherapy and radiation. Eventual abdominal peritoneal resection January 2011 , surgical pathology showed T2 N1 lesion. Adjuvant chemotherapy for 2 cycles caused a lot of symptoms and she stopped.  Colonoscopy 03/2011 (Sherry [name]) found several more neoplastic lesions, all adenomas without HGD, one was piecemeal resected, site labled with Ink.  HPI: This is a very pleasant 79 year old woman who is here with her daughter today.    Most recent CEA 03/2013 was noraml  CT scan last month, growing renal lesion, concerning for malignancy.  She saw urologist already.  Planning to follow with serial imaging.  There is a small site on her ostomy that bleeds, has bled for a long time.  She is seeing Dr. [name] on Friday about this.    Her ostomy otherwise works normally.     Past Medical History  Diagnosis Date  . CAD (coronary artery disease)   . Hyperlipidemia   . Hypertension   . Anemia   . COPD (chronic obstructive pulmonary disease)   . Headache(784.0)   . Hypothyroidism   . Osteoporosis   . Shingles   . Arthritis   . Tendon adhesions     torn tendon right shoulder  . Rectal adenocarcinoma dx'd 08/2009  . Rectal cancer   . Shortness of breath   . GERD (gastroesophageal reflux disease)   . History of blood transfusion     Past Surgical History  Procedure Laterality Date  . Appendectomy    . Cholecystectomy    . Vaginal hysterectomy    . Rectum removal      1/11 DR TSUEI  . Colon removal      8 " 12/2009 DR TSUEI  . Colon surgery      apr for rectal cancer 2011  . Cardiac catheterization    . Eye surgery      Cataract with lens- bil  . Colostomy    . Parastomal hernia repair  10/30/2012  . Ventral hernia repair  10/30/2012    Procedure:  LAPAROSCOPIC VENTRAL HERNIA;  Surgeon: Wilmon Arms. [name], MD;  Location: [facility] OR;  Service: General;  Laterality: N/A;  Laparoscopic repair of parastomal hernia    Current Outpatient Prescriptions  Medication Sig Dispense Refill  . albuterol (PROVENTIL HFA;VENTOLIN HFA) 108 (90 BASE) MCG/ACT inhaler Inhale 2 puffs into the lungs every 4 (four) hours as needed. For shortness of breath      . atorvastatin (LIPITOR) 10 MG tablet Take 1 tablet (10 mg total) by mouth daily.  90 tablet  3  . azithromycin (ZITHROMAX) 250 MG tablet 2 tablets once daily for 3 consecutive days  6 tablet  0  . dextromethorphan-guaiFENesin (MUCINEX DM) 30-600 MG per 12 hr tablet Take 1 tablet by mouth every 12 (twelve) hours.      Marland Kitchen diltiazem (TIAZAC) 180 MG 24 hr capsule TAKE 1 CAPSULE DAILY  90 capsule  1  . levothyroxine (SYNTHROID, LEVOTHROID) 75 MCG tablet Take 1 tablet (75 mcg total) by mouth daily.  90 tablet  1  . metoprolol succinate (TOPROL-XL) 25 MG 24 hr tablet Take 0.5 tablets (12.5 mg total) by mouth daily. 1/2 daily  90 tablet  1  . oxybutynin (DITROPAN-XL) 10 MG 24 hr tablet Take 10 mg by mouth daily.      . predniSONE (DELTASONE) 10 MG tablet Take 1  tablet (10 mg total) by mouth 2 (two) times daily.  10 tablet  0  . traMADol-acetaminophen (ULTRACET) 37.5-325 MG per tablet Take 1 tablet by mouth every 6 (six) hours as needed. For pain       No current facility-administered medications for this visit.    Allergies as of 08/19/2013 - Review Complete 08/19/2013  Allergen Reaction Noted  . Percocet [oxycodone-acetaminophen] Other (See Comments) 11/28/2012  . Influenza virus vacc split pf Other (See Comments) 09/01/2009  . Sulfamethoxazole w-trimethoprim Other (See Comments) 09/01/2009    Family History  Problem Relation Age of Onset  . Heart failure Father   . Cancer Mother     HEAD AND NECK  . Heart failure Sister   . Heart failure Brother   . Colon cancer Brother   . Colon cancer Brother   .  Cervical cancer      History   Social History  . Marital Status: Married    Spouse Name: N/A    Number of Children: 3  . Years of Education: N/A   Occupational History  . Not on file.   Social History Main Topics  . Smoking status: Former Smoker -- 65 years    Quit date: 12/18/2003  . Smokeless tobacco: Never Used     Comment: quit 2005  . Alcohol Use: No  . Drug Use: No  . Sexual Activity: No   Other Topics Concern  . Not on file   Social History Narrative  . No narrative on file      Physical Exam: BP 130/70  Pulse 60  Ht 5\' 3"  (1.6 m)  Wt 139 lb (63.05 kg)  BMI 24.63 kg/m2 Constitutional: generally well-appearing Psychiatric: alert and oriented x3 Abdomen: soft, nontender, nondistended, no obvious ascites, no peritoneal signs, normal bowel sounds Ostomy site evaluated: There was a small, 1-2 mm protuberant area just next to her skin at the ostomy site that appeared to be a very small blood vessel. I applied silver nitrate with an applicator stick to the sites. This did not cause bleeding.    Assessment and plan: 79 y.o. female with  personal history of rectal cancer, bleeding at ostomy site unrelated to cancer, previous adenomatous polyps, one was piecemeal resected in 2012  Silver nitrate application to the spot on her ostomy hopefully this will help. She is due to see her surgeon later this week and if she has rebleeding then I expect he will treated differently at that time. Check colonoscopy about 2 years ago and I removed 6 adenomatous polyps. One was piecemeal resected. Normally that would require repeat evaluation 6-12 months later. I'm not sure why she's not had that done. She is 80 but she is very fit both mentally and physically. We'll proceed with repeat colonoscopy via ostomy her soonest convenience.

## 2013-08-19 NOTE — Patient Instructions (Addendum)
You will be set up for a colonoscopy for polyp surveillance, personal history of colon cancer (LEC, moderate sedation).

## 2013-08-21 ENCOUNTER — Encounter (INDEPENDENT_AMBULATORY_CARE_PROVIDER_SITE_OTHER): Payer: Self-pay | Admitting: Surgery

## 2013-08-21 ENCOUNTER — Ambulatory Visit (INDEPENDENT_AMBULATORY_CARE_PROVIDER_SITE_OTHER): Payer: Medicare Other | Admitting: Surgery

## 2013-08-21 ENCOUNTER — Other Ambulatory Visit: Payer: Self-pay | Admitting: Internal Medicine

## 2013-08-21 VITALS — BP 130/74 | HR 70 | Temp 98.0°F | Resp 18 | Ht 63.0 in | Wt 138.0 lb

## 2013-08-21 DIAGNOSIS — C20 Malignant neoplasm of rectum: Secondary | ICD-10-CM | POA: Diagnosis not present

## 2013-08-21 DIAGNOSIS — Z433 Encounter for attention to colostomy: Secondary | ICD-10-CM | POA: Insufficient documentation

## 2013-08-21 NOTE — Progress Notes (Signed)
The patient comes in for evaluation of her descending colostomy. She has been having some oozing from the edge of her colostomy. She is scheduled for a colonoscopy by Dr. Christella Hartigan later this month. When she saw Dr. Christella Hartigan earlier in the week he cauterized an area of granulation tissue at the lower edge of her colostomy. She has not noted any bleeding since that time.  Filed Vitals:   08/21/13 0912  BP: 130/74  Pulse: 70  Temp: 98 F (36.7 C)  Resp: 18    Her abdominal incision is well healed with no sign of hernia. Her colostomy is pink and flat. The surrounding skin looks completely normal. The right lower edge of her colostomy has some granulation tissue. No sign of bleeding at this time. We did apply a little bit more silver nitrate to this area. If this bleeding recurs and we will be glad to see her back to repeat the silver nitrate. Otherwise we will see her as needed after her colonoscopy. She does have an appointment with her oncologist next month.  Wilmon Arms. [name], MD, [facility] Surgery  General/ Trauma Surgery  08/21/2013 10:02 AM

## 2013-09-08 ENCOUNTER — Ambulatory Visit (AMBULATORY_SURGERY_CENTER): Payer: Medicare Other | Admitting: Gastroenterology

## 2013-09-08 ENCOUNTER — Encounter: Payer: Self-pay | Admitting: Gastroenterology

## 2013-09-08 VITALS — BP 137/75 | HR 55 | Temp 97.6°F | Resp 16 | Ht 63.0 in | Wt 139.0 lb

## 2013-09-08 DIAGNOSIS — Z85038 Personal history of other malignant neoplasm of large intestine: Secondary | ICD-10-CM | POA: Diagnosis not present

## 2013-09-08 DIAGNOSIS — D126 Benign neoplasm of colon, unspecified: Secondary | ICD-10-CM

## 2013-09-08 DIAGNOSIS — Z86010 Personal history of colonic polyps: Secondary | ICD-10-CM

## 2013-09-08 MED ORDER — SODIUM CHLORIDE 0.9 % IV SOLN: 500.0000 mL | INTRAVENOUS | Status: DC

## 2013-09-08 NOTE — Progress Notes (Signed)
Patient did not experience any of the following events: a burn prior to discharge; a fall within the facility; wrong site/side/patient/procedure/implant event; or a hospital transfer or hospital admission upon discharge from the facility. (G8907) Patient did not have preoperative order for IV antibiotic SSI prophylaxis. (G8918)  

## 2013-09-08 NOTE — Op Note (Signed)
[facility] [address] [place], [address]   COLONOSCOPY PROCEDURE REPORT  PATIENT: Sherry [name], Sherry [name].  MR#: [id number] BIRTHDATE: March 09, 1923 , 89  yrs. old GENDER: Female ENDOSCOPIST: Rachael Fee, MD PROCEDURE DATE:  09/08/2013 PROCEDURE:   Colonoscopy with snare polypectomy First Screening Colonoscopy - Avg.  risk and is 50 yrs.  old or older - No.  Prior Negative Screening - Now for repeat screening. N/A  History of Adenoma - Now for follow-up colonoscopy & has been > or = to 3 yrs.  No.  It has been less than 3 yrs since last colonoscopy.  Medical reason.  Polyps Removed Today? Yes. ASA CLASS:   Class III INDICATIONS:Rectal cancer Diagnosed by incomplete colonoscopy at outside facility August 2010.  Endoscopic ultrasound by me September 2010 showed a T3N0 rectal adenocarcinoma; She underwent neoadjuvant chemotherapy and radiation.  Eventual abdominal peritoneal resection January 2011 , surgical pathology showed T2 N1 lesion.  Adjuvant chemotherapy for 2 cycles caused a lot of symptoms and she stopped.  Colonoscopy 03/2011 (Murat Rideout) found several more neoplastic lesions, all adenomas without HGD, one was piecemeal resected, site labled with Ink. MEDICATIONS: Fentanyl 75 mcg IV, Versed 7 mg IV, and These medications were titrated to patient response per physician's verbal order  DESCRIPTION OF PROCEDURE:   After the risks benefits and alternatives of the procedure were thoroughly explained, informed consent was obtained.  A digital rectal exam was not performed. The LB [id number]  endoscope was introduced through the left sided ostomy site and advanced to the cecum, which was identified by both the appendix and ileocecal valve. No adverse events experienced.   The quality of the prep was good.  The instrument was then slowly withdrawn as the colon was fully examined.   COLON FINDINGS: Two sessile polyps were found, removed, one was retrieved and  sent to pathology.  One was in cecum, 3mm across, removed with cold snare, not retrieved.  The other was in ascending colon1.2cm, elongated, removed with piecemeal cold snare, retrieved and sent to pathology.  The site of 2012 polypectomy, identified by residual Ink label, was normal, no recurrent polyp.  The examiantion was otherwise normal.  Retroflexion was not performed. The time to cecum=9 minutes 47 seconds.  Withdrawal time=11 minutes 49 seconds.  The scope was withdrawn and the procedure completed. COMPLICATIONS: There were no complications.  ENDOSCOPIC IMPRESSION: See above.  RECOMMENDATIONS: Likely will have you return to see me in the office in about 1 year to discuss, consider repeat colonoscopy (pending pathology review of todays polyp).   eSigned:  Rachael Fee, MD 09/08/2013 9:51 AM   cc: Marcille Blanco, MD

## 2013-09-08 NOTE — Patient Instructions (Signed)
YOU HAD AN ENDOSCOPIC PROCEDURE TODAY AT THE [facility]: Refer to the procedure report that was given to you for any specific questions about what was found during the examination.  If the procedure report does not answer your questions, please call your gastroenterologist to clarify.  If you requested that your care partner not be given the details of your procedure findings, then the procedure report has been included in a sealed envelope for you to review at your convenience later.  YOU SHOULD EXPECT: Some feelings of bloating in the abdomen. Passage of more gas than usual.  Walking can help get rid of the air that was put into your GI tract during the procedure and reduce the bloating. If you had a lower endoscopy (such as a colonoscopy or flexible sigmoidoscopy) you may notice spotting of blood in your stool or on the toilet paper. If you underwent a bowel prep for your procedure, then you may not have a normal bowel movement for a few days.  DIET: Your first meal following the procedure should be a light meal and then it is ok to progress to your normal diet.  A half-sandwich or bowl of soup is an example of a good first meal.  Heavy or fried foods are harder to digest and may make you feel nauseous or bloated.  Likewise meals heavy in dairy and vegetables can cause extra gas to form and this can also increase the bloating.  Drink plenty of fluids but you should avoid alcoholic beverages for 24 hours.  ACTIVITY: Your care partner should take you home directly after the procedure.  You should plan to take it easy, moving slowly for the rest of the day.  You can resume normal activity the day after the procedure however you should NOT DRIVE or use heavy machinery for 24 hours (because of the sedation medicines used during the test).    SYMPTOMS TO REPORT IMMEDIATELY: A gastroenterologist can be reached at any hour.  During normal business hours, 8:30 AM to 5:00 PM Monday through Friday,  call [phone].  After hours and on weekends, please call the GI answering service at [phone] who will take a message and have the physician on call contact you.   Following lower endoscopy (colonoscopy or flexible sigmoidoscopy):  Excessive amounts of blood in the stool  Significant tenderness or worsening of abdominal pains  Swelling of the abdomen that is new, acute  Fever of 100F or higher    FOLLOW UP: If any biopsies were taken you will be contacted by phone or by letter within the next 1-3 weeks.  Call your gastroenterologist if you have not heard about the biopsies in 3 weeks.  Our staff will call the home number listed on your records the next business day following your procedure to check on you and address any questions or concerns that you may have at that time regarding the information given to you following your procedure. This is a courtesy call and so if there is no answer at the home number and we have not heard from you through the emergency physician on call, we will assume that you have returned to your regular daily activities without incident.  SIGNATURES/CONFIDENTIALITY: You and/or your care partner have signed paperwork which will be entered into your electronic medical record.  These signatures attest to the fact that that the information above on your After Visit Summary has been reviewed and is understood.  Full responsibility of the confidentiality   of this discharge information lies with you and/or your care-partner.     

## 2013-09-09 ENCOUNTER — Telehealth: Payer: Self-pay | Admitting: *Deleted

## 2013-09-09 NOTE — Telephone Encounter (Signed)
  Follow up Call-  Call back number 09/08/2013 03/21/2011  Post procedure Call Back phone  # [phone]  Permission to leave phone message Yes -     Patient questions:  Do you have a fever, pain , or abdominal swelling? no Pain Score  0 *  Have you tolerated food without any problems? yes  Have you been able to return to your normal activities? yes  Do you have any questions about your discharge instructions: Diet   no Medications  no Follow up visit  no  Do you have questions or concerns about your Care? no  Actions: * If pain score is 4 or above: No action needed, pain <4.

## 2013-09-15 ENCOUNTER — Ambulatory Visit: Payer: Medicare Other | Admitting: Internal Medicine

## 2013-09-16 ENCOUNTER — Encounter: Payer: Self-pay | Admitting: Gastroenterology

## 2013-09-17 ENCOUNTER — Other Ambulatory Visit: Payer: Self-pay | Admitting: *Deleted

## 2013-09-17 ENCOUNTER — Ambulatory Visit (INDEPENDENT_AMBULATORY_CARE_PROVIDER_SITE_OTHER): Payer: Medicare Other | Admitting: Internal Medicine

## 2013-09-17 ENCOUNTER — Encounter: Payer: Self-pay | Admitting: Internal Medicine

## 2013-09-17 VITALS — BP 110/70 | HR 57 | Temp 98.0°F | Resp 20 | Wt 142.0 lb

## 2013-09-17 DIAGNOSIS — I10 Essential (primary) hypertension: Secondary | ICD-10-CM | POA: Diagnosis not present

## 2013-09-17 DIAGNOSIS — Z2911 Encounter for prophylactic immunotherapy for respiratory syncytial virus (RSV): Secondary | ICD-10-CM

## 2013-09-17 DIAGNOSIS — C20 Malignant neoplasm of rectum: Secondary | ICD-10-CM | POA: Diagnosis not present

## 2013-09-17 DIAGNOSIS — E039 Hypothyroidism, unspecified: Secondary | ICD-10-CM | POA: Diagnosis not present

## 2013-09-17 DIAGNOSIS — E785 Hyperlipidemia, unspecified: Secondary | ICD-10-CM

## 2013-09-17 DIAGNOSIS — Z23 Encounter for immunization: Secondary | ICD-10-CM | POA: Diagnosis not present

## 2013-09-17 MED ORDER — METOPROLOL SUCCINATE ER 25 MG PO TB24: 12.5000 mg | ORAL_TABLET | Freq: Every day | ORAL | Status: DC

## 2013-09-17 MED ORDER — ALBUTEROL SULFATE HFA 108 (90 BASE) MCG/ACT IN AERS: 2.0000 | INHALATION_SPRAY | RESPIRATORY_TRACT | Status: DC | PRN

## 2013-09-17 NOTE — Patient Instructions (Signed)
Limit your sodium (Salt) intake  Return in 6 months for follow-up  

## 2013-09-17 NOTE — Progress Notes (Signed)
Subjective:    Patient ID: Sherry [name], female    DOB: 1923/09/30, 79 y.o.   MRN: [id number]  HPI  79 year old patient who has a prior history of rectal carcinoma. She has been seen by general surgery and GI recently and has had a followup colonoscopy. She is scheduled to see oncology next month. She continues to do remarkably well. She has hypertension dyslipidemia and a history of hypothyroidism. No concerns or complaints  Past Medical History  Diagnosis Date  . CAD (coronary artery disease)   . Hyperlipidemia   . Hypertension   . Anemia   . COPD (chronic obstructive pulmonary disease)   . Headache(784.0)   . Hypothyroidism   . Osteoporosis   . Shingles   . Arthritis   . Tendon adhesions     torn tendon right shoulder  . Rectal adenocarcinoma dx'd 08/2009  . Rectal cancer   . Shortness of breath   . GERD (gastroesophageal reflux disease)   . History of blood transfusion     History   Social History  . Marital Status: Married    Spouse Name: N/A    Number of Children: 3  . Years of Education: N/A   Occupational History  . Not on file.   Social History Main Topics  . Smoking status: Former Smoker -- 65 years    Quit date: 12/18/2003  . Smokeless tobacco: Never Used     Comment: quit 2005  . Alcohol Use: No  . Drug Use: No  . Sexual Activity: No   Other Topics Concern  . Not on file   Social History Narrative  . No narrative on file    Past Surgical History  Procedure Laterality Date  . Appendectomy    . Cholecystectomy    . Vaginal hysterectomy    . Rectum removal      1/11 DR TSUEI  . Colon removal      8 " 12/2009 DR TSUEI  . Colon surgery      apr for rectal cancer 2011  . Cardiac catheterization    . Eye surgery      Cataract with lens- bil  . Colostomy    . Parastomal hernia repair  10/30/2012  . Ventral hernia repair  10/30/2012    Procedure: LAPAROSCOPIC VENTRAL HERNIA;  Surgeon: Wilmon Arms. [name], MD;  Location: [facility] OR;  Service:  General;  Laterality: N/A;  Laparoscopic repair of parastomal hernia    Family History  Problem Relation Age of Onset  . Heart failure Father   . Cancer Mother     HEAD AND NECK  . Heart failure Sister   . Heart failure Brother   . Colon cancer Brother   . Colon cancer Brother   . Cervical cancer      Allergies  Allergen Reactions  . Percocet [Oxycodone-Acetaminophen] Other (See Comments)    Upsets stomach  . Influenza Virus Vacc Split Pf Other (See Comments)    Reaction unknown  . Sulfamethoxazole-Trimethoprim Other (See Comments)    Reaction unknown    Current Outpatient Prescriptions on File Prior to Visit  Medication Sig Dispense Refill  . albuterol (PROVENTIL HFA;VENTOLIN HFA) 108 (90 BASE) MCG/ACT inhaler Inhale 2 puffs into the lungs every 4 (four) hours as needed. For shortness of breath      . atorvastatin (LIPITOR) 10 MG tablet Take 1 tablet (10 mg total) by mouth daily.  90 tablet  3  . dextromethorphan-guaiFENesin (MUCINEX DM) 30-600 MG per 12 hr  tablet Take 1 tablet by mouth every 12 (twelve) hours.      Marland Kitchen diltiazem (TIAZAC) 180 MG 24 hr capsule TAKE 1 CAPSULE DAILY  90 capsule  1  . levothyroxine (SYNTHROID, LEVOTHROID) 75 MCG tablet TAKE 1 TABLET DAILY  90 tablet  1  . metoprolol succinate (TOPROL-XL) 25 MG 24 hr tablet Take 0.5 tablets (12.5 mg total) by mouth daily. 1/2 daily  90 tablet  1  . oxybutynin (DITROPAN-XL) 10 MG 24 hr tablet Take 10 mg by mouth daily.      . traMADol-acetaminophen (ULTRACET) 37.5-325 MG per tablet Take 1 tablet by mouth every 6 (six) hours as needed. For pain       No current facility-administered medications on file prior to visit.    BP 110/70  Pulse 57  Temp(Src) 98 F (36.7 C) (Oral)  Resp 20  Wt 142 lb (64.411 kg)  BMI 25.16 kg/m2  SpO2 96%       Review of Systems  Constitutional: Negative.   HENT: Negative for hearing loss, congestion, sore throat, rhinorrhea, dental problem, sinus pressure and tinnitus.    Eyes: Negative for pain, discharge and visual disturbance.  Respiratory: Negative for cough and shortness of breath.   Cardiovascular: Negative for chest pain, palpitations and leg swelling.  Gastrointestinal: Negative for nausea, vomiting, abdominal pain, diarrhea, constipation, blood in stool and abdominal distention.  Genitourinary: Negative for dysuria, urgency, frequency, hematuria, flank pain, vaginal bleeding, vaginal discharge, difficulty urinating, vaginal pain and pelvic pain.  Musculoskeletal: Negative for joint swelling, arthralgias and gait problem.  Skin: Negative for rash.  Neurological: Negative for dizziness, syncope, speech difficulty, weakness, numbness and headaches.  Hematological: Negative for adenopathy.  Psychiatric/Behavioral: Negative for behavioral problems, dysphoric mood and agitation. The patient is not nervous/anxious.        Objective:   Physical Exam  Constitutional: She is oriented to person, place, and time. She appears well-developed and well-nourished.  HENT:  Head: Normocephalic.  Right Ear: External ear normal.  Left Ear: External ear normal.  Mouth/Throat: Oropharynx is clear and moist.  Eyes: Conjunctivae and EOM are normal. Pupils are equal, round, and reactive to light.  Neck: Normal range of motion. Neck supple. No thyromegaly present.  Cardiovascular: Normal rate, regular rhythm, normal heart sounds and intact distal pulses.   Pulmonary/Chest: Effort normal and breath sounds normal.  Abdominal: Soft. Bowel sounds are normal. She exhibits no mass. There is no tenderness.  Functioning colostomy  Musculoskeletal: Normal range of motion.  Lymphadenopathy:    She has no cervical adenopathy.  Neurological: She is alert and oriented to person, place, and time.  Skin: Skin is warm and dry. No rash noted.  Psychiatric: She has a normal mood and affect. Her behavior is normal.          Assessment & Plan:  Hypertension well  controlled Dyslipidemia. Continue atorvastatin Hypothyroidism History of rectal cancer  Followup 6 months Oncology followup as scheduled

## 2013-09-24 ENCOUNTER — Ambulatory Visit (INDEPENDENT_AMBULATORY_CARE_PROVIDER_SITE_OTHER): Payer: Medicare Other | Admitting: Ophthalmology

## 2013-09-24 DIAGNOSIS — H26499 Other secondary cataract, unspecified eye: Secondary | ICD-10-CM

## 2013-09-24 DIAGNOSIS — I10 Essential (primary) hypertension: Secondary | ICD-10-CM

## 2013-09-24 DIAGNOSIS — H35039 Hypertensive retinopathy, unspecified eye: Secondary | ICD-10-CM | POA: Diagnosis not present

## 2013-09-24 DIAGNOSIS — H31009 Unspecified chorioretinal scars, unspecified eye: Secondary | ICD-10-CM

## 2013-09-24 DIAGNOSIS — H43819 Vitreous degeneration, unspecified eye: Secondary | ICD-10-CM

## 2013-09-24 DIAGNOSIS — H35349 Macular cyst, hole, or pseudohole, unspecified eye: Secondary | ICD-10-CM

## 2013-09-30 ENCOUNTER — Ambulatory Visit (INDEPENDENT_AMBULATORY_CARE_PROVIDER_SITE_OTHER): Payer: Self-pay | Admitting: Ophthalmology

## 2013-09-30 ENCOUNTER — Ambulatory Visit (INDEPENDENT_AMBULATORY_CARE_PROVIDER_SITE_OTHER): Payer: Medicare Other | Admitting: Ophthalmology

## 2013-10-01 ENCOUNTER — Ambulatory Visit (HOSPITAL_BASED_OUTPATIENT_CLINIC_OR_DEPARTMENT_OTHER): Payer: Medicare Other | Admitting: Oncology

## 2013-10-01 ENCOUNTER — Telehealth: Payer: Self-pay | Admitting: Oncology

## 2013-10-01 ENCOUNTER — Other Ambulatory Visit (HOSPITAL_BASED_OUTPATIENT_CLINIC_OR_DEPARTMENT_OTHER): Payer: Medicare Other | Admitting: Lab

## 2013-10-01 VITALS — BP 166/75 | HR 56 | Temp 98.2°F | Resp 18 | Wt 140.1 lb

## 2013-10-01 DIAGNOSIS — C20 Malignant neoplasm of rectum: Secondary | ICD-10-CM

## 2013-10-01 LAB — COMPREHENSIVE METABOLIC PANEL (CC13)
ALT: 9 U/L (ref 0–55)
AST: 12 U/L (ref 5–34)
Albumin: 3.7 g/dL (ref 3.5–5.0)
Alkaline Phosphatase: 142 U/L (ref 40–150)
Anion Gap: 6 meq/L (ref 3–11)
BUN: 13 mg/dL (ref 7.0–26.0)
CO2: 28 meq/L (ref 22–29)
Calcium: 10.5 mg/dL — ABNORMAL HIGH (ref 8.4–10.4)
Chloride: 106 meq/L (ref 98–109)
Creatinine: 0.8 mg/dL (ref 0.6–1.1)
Glucose: 91 mg/dL (ref 70–140)
Potassium: 4 meq/L (ref 3.5–5.1)
Sodium: 140 meq/L (ref 136–145)
Total Bilirubin: 0.44 mg/dL (ref 0.20–1.20)
Total Protein: 6.9 g/dL (ref 6.4–8.3)

## 2013-10-01 LAB — CBC WITH DIFFERENTIAL/PLATELET
BASO%: 0.6 % (ref 0.0–2.0)
Basophils Absolute: 0 10*3/uL (ref 0.0–0.1)
EOS%: 2.4 % (ref 0.0–7.0)
HCT: 37.8 % (ref 34.8–46.6)
HGB: 12.6 g/dL (ref 11.6–15.9)
LYMPH%: 13 % — ABNORMAL LOW (ref 14.0–49.7)
MCH: 29.9 pg (ref 25.1–34.0)
MCHC: 33.4 g/dL (ref 31.5–36.0)
MCV: 89.5 fL (ref 79.5–101.0)
MONO%: 9.5 % (ref 0.0–14.0)
NEUT%: 74.5 % (ref 38.4–76.8)
Platelets: 240 10*3/uL (ref 145–400)
lymph#: 0.7 10*3/uL — ABNORMAL LOW (ref 0.9–3.3)

## 2013-10-01 NOTE — Progress Notes (Signed)
Hematology and Oncology Follow Up Visit  Sherry [name] [id number] 1923-10-15 79 y.o. 10/01/2013 1:27 PM  Sherry Chiquito, MD  Sherry Bunting, MD  Sherry Rudd, MD  Sherry Blackbird, MD, PhD (Radiation Oncology)  Principle Diagnosis:  This is an 79 year old female diagnosed with a T3 N0 rectal adenocarcinoma by EUS criteria in September 2010.  She did have subsequently a T2 N1 stage III rectal cancer.   Prior Therapy:  1.  She received neoadjuvant therapy concomitantly with Xeloda, therapy concluded on October 31, 2010.  She received total 5040 cGy in 28 fractions. 2.  The patient received abdominoperineal resection done December 27, 2009.  She had a T2 N1 with 1 out of 9 lymph nodes involved. 3.  The patient received two cycles of adjuvant FOLFOX chemotherapy, last given was March 2011.  The patient discontinued chemotherapy due to her wishes and due to her intolerance with the small bowel obstruction that was recurrent.  Current therapy: Surveillance and watchful observation.  Interim History:  Sherry [name] presents today for an office follow-up visit.  Her daughter accompanies her.  This is a pleasant 79 year old female with stage III rectal cancer.  She has had neoadjuvant therapy followed by surgical resection, but again stopped chemotherapy prematurely due to poor tolerance.  She is currently on active surveillance without any evidence to suggest any recurrent disease.  She has not reported any major complaints.  She does not report any nausea, vomiting, diarrhea, constipation.  She does not report any melena, hematochezia or hematuria, fevers, chills or night sweats.  She is eating and drinking quite well. She is S/P a recent colonoscopy without any complications.     Medications: I have reviewed the patient's current medications. Current Outpatient Prescriptions  Medication Sig Dispense Refill  . albuterol (PROVENTIL HFA;VENTOLIN HFA) 108 (90 BASE) MCG/ACT inhaler Inhale 2 puffs into  the lungs every 4 (four) hours as needed. For shortness of breath  3 Inhaler  4  . atorvastatin (LIPITOR) 10 MG tablet Take 1 tablet (10 mg total) by mouth daily.  90 tablet  3  . dextromethorphan-guaiFENesin (MUCINEX DM) 30-600 MG per 12 hr tablet Take 1 tablet by mouth every 12 (twelve) hours.      Marland Kitchen diltiazem (TIAZAC) 180 MG 24 hr capsule TAKE 1 CAPSULE DAILY  90 capsule  1  . levothyroxine (SYNTHROID, LEVOTHROID) 75 MCG tablet TAKE 1 TABLET DAILY  90 tablet  1  . metoprolol succinate (TOPROL-XL) 25 MG 24 hr tablet Take 0.5 tablets (12.5 mg total) by mouth daily. 1/2 daily  90 tablet  3  . oxybutynin (DITROPAN-XL) 10 MG 24 hr tablet Take 10 mg by mouth daily.      . traMADol-acetaminophen (ULTRACET) 37.5-325 MG per tablet Take 1 tablet by mouth every 6 (six) hours as needed. For pain       No current facility-administered medications for this visit.    Allergies:  Allergies  Allergen Reactions  . Percocet [Oxycodone-Acetaminophen] Other (See Comments)    Upsets stomach  . Influenza Virus Vacc Split Pf Other (See Comments)    Reaction unknown  . Sulfamethoxazole-Trimethoprim Other (See Comments)    Reaction unknown    Past Medical History, Surgical history, Social history, and Family History were reviewed and updated.  Review of Systems: Remaining ROS negative. Physical Exam: Blood pressure 166/75, pulse 56, temperature 98.2 F (36.8 C), temperature source Oral, resp. rate 18, weight 140 lb 2 oz (63.56 kg). ECOG: 1 General appearance: alert [name]: Normocephalic, without obvious  abnormality, atraumatic Neck: no adenopathy, no carotid bruit, no JVD, supple, symmetrical, trachea midline and thyroid not enlarged, symmetric, no tenderness/mass/nodules Lymph nodes: Cervical, supraclavicular, and axillary nodes normal. Heart:regular rate and rhythm, S1, S2 normal, no murmur, click, rub or gallop Lung:chest clear, no wheezing, rales, normal symmetric air entry Abdomin: soft, non-tender,  without masses or organomegaly. Bulge noted around the colostomy.  EXT:no erythema, induration, or nodules   Lab Results: Lab Results  Component Value Date   WBC 5.4 10/01/2013   HGB 12.6 10/01/2013   HCT 37.8 10/01/2013   MCV 89.5 10/01/2013   PLT 240 10/01/2013     Chemistry      Component Value Date/Time   NA 141 04/01/2013 1506   NA 138 11/01/2012 0650   NA 140 08/04/2012 0949   K 3.9 04/01/2013 1506   K 4.4 11/01/2012 0650   K 4.1 08/04/2012 0949   CL 106 04/01/2013 1506   CL 107 11/01/2012 0650   CL 100 08/04/2012 0949   CO2 25 04/01/2013 1506   CO2 26 11/01/2012 0650   CO2 30 08/04/2012 0949   BUN 14 07/27/2013 1407   BUN 15.2 04/01/2013 1506   BUN 16 08/04/2012 0949   CREATININE 0.68 07/27/2013 1407   CREATININE 0.8 04/01/2013 1506   CREATININE 0.64 11/01/2012 0650      Component Value Date/Time   CALCIUM 10.7* 04/01/2013 1506   CALCIUM 10.0 11/01/2012 0650   CALCIUM 9.8 08/04/2012 0949   ALKPHOS 130 04/01/2013 1506   ALKPHOS 131* 08/04/2012 0949   ALKPHOS 126* 04/03/2012 0919   AST 12 04/01/2013 1506   AST 20 08/04/2012 0949   AST 14 04/03/2012 0919   ALT 9 04/01/2013 1506   ALT 24 08/04/2012 0949   ALT 11 04/03/2012 0919   BILITOT 0.40 04/01/2013 1506   BILITOT 0.60 08/04/2012 0949   BILITOT 0.4 04/03/2012 0919     CT ABDOMEN AND PELVIS WITHOUT AND WITH CONTRAST  Technique: Multidetector CT imaging of the abdomen and pelvis was  performed without contrast material in one or both body regions,  followed by contrast material(s) and further sections in one or  both body regions.  Contrast: OMNIPAQUE IOHEXOL 350 MG/ML SOLN  Comparison: Prior CT abdomen/pelvis 02/05/2013  Findings:  Lower Chest: Mild emphysematous changes and air trapping in the  bases. The inferior lungs are otherwise clear. No suspicious  pulmonary nodule. The visualized heart is enlarged, predominately  the right atrium. There is no pericardial effusion.  Atherosclerotic calcifications are noted in  the visualized coronary  arteries. Small hiatal hernia incidentally noted.  Abdomen: Scattered punctate calcifications throughout the spleen  and hepatic parenchyma consistent with the sequela of old  granulomatous disease. Unremarkable CT appearance of the stomach  and duodenum. Stable sub centimeter hypoattenuating lesion in the  superior aspect of hepatic segment seven which is too small to  characterize. Surgical changes of prior cholecystectomy. No intra  or extrahepatic biliary ductal dilatation. Adreniform thickening  of both adrenal glands is similar to prior consistent with adrenal  hyperplasia. No focal pancreatic lesion.  Numerous sub centimeter hypoattenuating lesions throughout both  kidneys are too small to accurately characterize. The lesion of  concern in the posterior interpolar left kidney has slightly  enlarged measuring 10 x 9 mm today compared to 8 x 5 mm previously.  The lesion again demonstrates a suggestion of enhancement although  it is nearly too small for accurate characterization. Punctate  nonobstructing stones in the interpolar right  kidney and upper pole  left kidney. Stable mild right pelvicaliectasis. No dilatation of  the left collecting system.  No evidence of bowel obstruction or focal bowel wall thickening.  Left lower quadrant colostomy without significant parastomal  hernia. No suspicious adenopathy.  Bones: No acute fracture or aggressive appearing lytic or blastic  osseous lesion. The bones appear diffusely osteopenic. There is  multilevel degenerative changes and lower lumbar facet arthropathy.  Grade 1 anterolisthesis of L4 on L5.  Vascular: Diffuse atherosclerotic vascular disease with saccular  infrarenal abdominal aortic aneurysm. Maximal diameter is  minimally enlarged at 3.1 cm compared to 3 cm previously.  IMPRESSION:  1. Slight interval enlargement of a likely enhancing renal lesion  in the interpolar left kidney concerning for a small  renal cell  carcinoma. The lesion measures 10 x 9 mm today compared to 8 x 4  mm on the prior study.  2. Stable to incrementally enlarged saccular infrarenal abdominal  aortic aneurysm with maximal diameter of 3.1 cm. Continued  attention on follow-up imaging.  3. Additional ancillary findings as above without significant  interval change.    Impression and Plan: This is an 79 year old female with the following issues; 1.  Stage III colorectal cancer, she is status post surgical resection in January 2011.  She did not tolerate the course of adjuvant chemotherapy.  She is currently on observation and surveillance. She did have a CT scan on 07/30/2013 and did not show any evidence to suggest cancer recurrence.   2.  Recurrent small bowel obstruction, that currently has resolved.  No new symptoms. 3.  Surveillance colonoscopies: this was done this year and followed by Dr. Christella Hartigan 4.  Abdominal  hernia. She is S/P repair and doing well.  5.  left kidney lesion: Very small in size at this time and currently under surveillance by Dr. Lenn Sink. 5.  Follow up: 12 months with a CT scan as needed.    Eli Hose, MD 10/16/20141:27 PM

## 2013-10-01 NOTE — Telephone Encounter (Signed)
gv and printed appt sched and avs for pt for OCT 2015  °

## 2013-10-09 ENCOUNTER — Ambulatory Visit (INDEPENDENT_AMBULATORY_CARE_PROVIDER_SITE_OTHER): Payer: Self-pay | Admitting: Ophthalmology

## 2013-10-09 DIAGNOSIS — H2700 Aphakia, unspecified eye: Secondary | ICD-10-CM

## 2013-10-22 ENCOUNTER — Other Ambulatory Visit: Payer: Self-pay

## 2013-11-26 ENCOUNTER — Encounter: Payer: Self-pay | Admitting: Internal Medicine

## 2013-11-26 ENCOUNTER — Ambulatory Visit (INDEPENDENT_AMBULATORY_CARE_PROVIDER_SITE_OTHER): Payer: Medicare Other | Admitting: Internal Medicine

## 2013-11-26 VITALS — BP 150/90 | HR 65 | Temp 98.2°F | Resp 20 | Wt 143.0 lb

## 2013-11-26 DIAGNOSIS — I10 Essential (primary) hypertension: Secondary | ICD-10-CM

## 2013-11-26 DIAGNOSIS — C20 Malignant neoplasm of rectum: Secondary | ICD-10-CM

## 2013-11-26 DIAGNOSIS — M545 Low back pain: Secondary | ICD-10-CM

## 2013-11-26 DIAGNOSIS — R1032 Left lower quadrant pain: Secondary | ICD-10-CM | POA: Diagnosis not present

## 2013-11-26 DIAGNOSIS — R2681 Unsteadiness on feet: Secondary | ICD-10-CM | POA: Insufficient documentation

## 2013-11-26 LAB — POCT URINALYSIS DIPSTICK
Blood, UA: NEGATIVE
Glucose, UA: NEGATIVE
Ketones, UA: NEGATIVE
Urobilinogen, UA: 0.2

## 2013-11-26 MED ORDER — CIPROFLOXACIN HCL 500 MG PO TABS: 500.0000 mg | ORAL_TABLET | Freq: Two times a day (BID) | ORAL | Status: DC

## 2013-11-26 NOTE — Patient Instructions (Signed)
Take your antibiotic as prescribed until ALL of it is gone, but stop if you develop a rash, swelling, or any side effects of the medication.  Contact our office as soon as possible if  there are side effects of the medication.  Drink as much fluid as you  can tolerate over the next few days 

## 2013-11-26 NOTE — Progress Notes (Signed)
Pre-visit discussion using our clinic review tool. No additional management support is needed unless otherwise documented below in the visit note.  

## 2013-11-26 NOTE — Progress Notes (Signed)
Subjective:    Patient ID: Sherry [name], female    DOB: 1923/03/18, 79 y.o.   MRN: [id number]  HPI Pre-visit discussion using our clinic review tool. No additional management support is needed unless otherwise documented below in the visit note.  79 year old patient who has remote history of rectal cancer. She is status post colostomy. For the past several days she has had some suprapubic and left lower quadrant discomfort about the ostomy site. She has noticed some occasional dysuria and urinary frequency. She does have a history of overactive bladder. Urinalysis was reviewed and revealed the pyuria. No fever chills or flank pain. No vomiting  Past Medical History  Diagnosis Date  . CAD (coronary artery disease)   . Hyperlipidemia   . Hypertension   . Anemia   . COPD (chronic obstructive pulmonary disease)   . Headache(784.0)   . Hypothyroidism   . Osteoporosis   . Shingles   . Arthritis   . Tendon adhesions     torn tendon right shoulder  . Rectal adenocarcinoma dx'd 08/2009  . Rectal cancer   . Shortness of breath   . GERD (gastroesophageal reflux disease)   . History of blood transfusion     History   Social History  . Marital Status: Married    Spouse Name: N/A    Number of Children: 3  . Years of Education: N/A   Occupational History  . Not on file.   Social History Main Topics  . Smoking status: Former Smoker -- 65 years    Quit date: 12/18/2003  . Smokeless tobacco: Never Used     Comment: quit 2005  . Alcohol Use: No  . Drug Use: No  . Sexual Activity: No   Other Topics Concern  . Not on file   Social History Narrative  . No narrative on file    Past Surgical History  Procedure Laterality Date  . Appendectomy    . Cholecystectomy    . Vaginal hysterectomy    . Rectum removal      1/11 DR TSUEI  . Colon removal      8 " 12/2009 DR TSUEI  . Colon surgery      apr for rectal cancer 2011  . Cardiac catheterization    . Eye surgery     Cataract with lens- bil  . Colostomy    . Parastomal hernia repair  10/30/2012  . Ventral hernia repair  10/30/2012    Procedure: LAPAROSCOPIC VENTRAL HERNIA;  Surgeon: Wilmon Arms. [name], MD;  Location: [facility] OR;  Service: General;  Laterality: N/A;  Laparoscopic repair of parastomal hernia    Family History  Problem Relation Age of Onset  . Heart failure Father   . Cancer Mother     HEAD AND NECK  . Heart failure Sister   . Heart failure Brother   . Colon cancer Brother   . Colon cancer Brother   . Cervical cancer      Allergies  Allergen Reactions  . Percocet [Oxycodone-Acetaminophen] Other (See Comments)    Upsets stomach  . Influenza Virus Vacc Split Pf Other (See Comments)    Reaction unknown  . Sulfamethoxazole-Trimethoprim Other (See Comments)    Reaction unknown    Current Outpatient Prescriptions on File Prior to Visit  Medication Sig Dispense Refill  . albuterol (PROVENTIL HFA;VENTOLIN HFA) 108 (90 BASE) MCG/ACT inhaler Inhale 2 puffs into the lungs every 4 (four) hours as needed. For shortness of breath  3 Inhaler  4  . atorvastatin (LIPITOR) 10 MG tablet Take 1 tablet (10 mg total) by mouth daily.  90 tablet  3  . dextromethorphan-guaiFENesin (MUCINEX DM) 30-600 MG per 12 hr tablet Take 1 tablet by mouth every 12 (twelve) hours.      Marland Kitchen diltiazem (TIAZAC) 180 MG 24 hr capsule TAKE 1 CAPSULE DAILY  90 capsule  1  . levothyroxine (SYNTHROID, LEVOTHROID) 75 MCG tablet TAKE 1 TABLET DAILY  90 tablet  1  . metoprolol succinate (TOPROL-XL) 25 MG 24 hr tablet Take 0.5 tablets (12.5 mg total) by mouth daily. 1/2 daily  90 tablet  3  . oxybutynin (DITROPAN-XL) 10 MG 24 hr tablet Take 10 mg by mouth daily.      . traMADol-acetaminophen (ULTRACET) 37.5-325 MG per tablet Take 1 tablet by mouth every 6 (six) hours as needed. For pain       No current facility-administered medications on file prior to visit.    BP 150/90  Pulse 65  Temp(Src) 98.2 F (36.8 C) (Oral)  Resp 20   Wt 143 lb (64.864 kg)  SpO2 95%       Review of Systems  Constitutional: Negative.   HENT: Negative for congestion, dental problem, hearing loss, rhinorrhea, sinus pressure, sore throat and tinnitus.   Eyes: Negative for pain, discharge and visual disturbance.  Respiratory: Negative for cough and shortness of breath.   Cardiovascular: Negative for chest pain, palpitations and leg swelling.  Gastrointestinal: Positive for abdominal pain. Negative for nausea, vomiting, diarrhea, constipation, blood in stool and abdominal distention.  Genitourinary: Positive for dysuria and frequency. Negative for urgency, hematuria, flank pain, vaginal bleeding, vaginal discharge, difficulty urinating, vaginal pain and pelvic pain.  Musculoskeletal: Negative for arthralgias, gait problem and joint swelling.  Skin: Negative for rash.  Neurological: Negative for dizziness, syncope, speech difficulty, weakness, numbness and headaches.  Hematological: Negative for adenopathy.  Psychiatric/Behavioral: Negative for behavioral problems, dysphoric mood and agitation. The patient is not nervous/anxious.        Objective:   Physical Exam  Constitutional: She is oriented to person, place, and time. She appears well-developed and well-nourished. No distress.  HENT:  Head: Normocephalic.  Right Ear: External ear normal.  Left Ear: External ear normal.  Mouth/Throat: Oropharynx is clear and moist.  Eyes: Conjunctivae and EOM are normal. Pupils are equal, round, and reactive to light.  Neck: Normal range of motion. Neck supple. No thyromegaly present.  Cardiovascular: Normal rate, regular rhythm, normal heart sounds and intact distal pulses.   Pulmonary/Chest: Effort normal and breath sounds normal.  Abdominal: Soft. Bowel sounds are normal. She exhibits no mass. There is tenderness.  Very minimal tenderness in the suprapubic and left lower quadrant. The ostomy site in the left lower quadrant appeared to be  stable without  inflammation  Musculoskeletal: Normal range of motion.  Lymphadenopathy:    She has no cervical adenopathy.  Neurological: She is alert and oriented to person, place, and time.  Skin: Skin is warm and dry. No rash noted.  Psychiatric: She has a normal mood and affect. Her behavior is normal.          Assessment & Plan:   Pyuria Lower abdominal pain. We'll treat with Cipro for 7 days. Possible early UTI versus mild diverticular disease Hypothyroidism

## 2013-12-02 ENCOUNTER — Encounter: Payer: Self-pay | Admitting: *Deleted

## 2013-12-08 ENCOUNTER — Telehealth: Payer: Self-pay | Admitting: Internal Medicine

## 2013-12-08 DIAGNOSIS — R1032 Left lower quadrant pain: Secondary | ICD-10-CM

## 2013-12-08 NOTE — Telephone Encounter (Signed)
Pt was seen on 11-26-13. Pt is still having abd pain. Please advise

## 2013-12-09 MED ORDER — HYDROCODONE-ACETAMINOPHEN 5-325 MG PO TABS: 1.0000 | ORAL_TABLET | Freq: Four times a day (QID) | ORAL | Status: DC | PRN

## 2013-12-09 NOTE — Telephone Encounter (Signed)
Please schedule abdominal/pelvic CT scan with contrast. Patient is allergic to oxycodone. Can patient take Vicodin? Continue tramadol.  prescription for Vicodin if patient is able to tolerate Followup oncology

## 2013-12-09 NOTE — Telephone Encounter (Signed)
Spoke with patient and she is able to take Vicodin.  CT ordered.

## 2013-12-14 ENCOUNTER — Other Ambulatory Visit (INDEPENDENT_AMBULATORY_CARE_PROVIDER_SITE_OTHER): Payer: Medicare Other

## 2013-12-14 DIAGNOSIS — I10 Essential (primary) hypertension: Secondary | ICD-10-CM | POA: Diagnosis not present

## 2013-12-14 LAB — BASIC METABOLIC PANEL
Calcium: 10.1 mg/dL (ref 8.4–10.5)
Creatinine, Ser: 0.7 mg/dL (ref 0.4–1.2)
GFR: 81.21 mL/min (ref >60.00)
Potassium: 3.9 mEq/L (ref 3.5–5.1)
Sodium: 140 mEq/L (ref 135–145)

## 2013-12-16 ENCOUNTER — Ambulatory Visit (INDEPENDENT_AMBULATORY_CARE_PROVIDER_SITE_OTHER)
Admission: RE | Admit: 2013-12-16 | Discharge: 2013-12-16 | Disposition: A | Discharge status: Home / Self Care | Payer: Medicare Other | Source: Ambulatory Visit | Attending: Internal Medicine | Admitting: Internal Medicine

## 2013-12-16 DIAGNOSIS — R1032 Left lower quadrant pain: Secondary | ICD-10-CM | POA: Diagnosis not present

## 2013-12-16 MED ORDER — IOHEXOL 300 MG/ML  SOLN
100.0000 mL | Freq: Once | INTRAMUSCULAR | Status: AC | PRN
  Administered 2013-12-16: 100 mL via INTRAVENOUS

## 2013-12-21 ENCOUNTER — Telehealth (INDEPENDENT_AMBULATORY_CARE_PROVIDER_SITE_OTHER): Payer: Self-pay

## 2013-12-21 NOTE — Telephone Encounter (Signed)
Call pt regarding issue she has had before.  She wants to talk to [place] only

## 2013-12-28 ENCOUNTER — Ambulatory Visit (INDEPENDENT_AMBULATORY_CARE_PROVIDER_SITE_OTHER): Payer: Medicare Other | Admitting: Surgery

## 2013-12-28 ENCOUNTER — Encounter (INDEPENDENT_AMBULATORY_CARE_PROVIDER_SITE_OTHER): Payer: Self-pay | Admitting: Surgery

## 2013-12-28 VITALS — BP 126/84 | HR 72 | Temp 98.7°F | Resp 14 | Ht 63.5 in | Wt 137.8 lb

## 2013-12-28 DIAGNOSIS — N815 Vaginal enterocele: Secondary | ICD-10-CM | POA: Diagnosis not present

## 2013-12-28 NOTE — Progress Notes (Signed)
This is an 80 year old female who is status post APR in January of 2011 for a rectal cancer.  She had a T2 N1 stage III rectal cancer. She was unable to tolerate a full course of chemotherapy. She had a parastomal hernia repaired By laparoscopic approach with mesh using the Sugarbaker method. She had some residual pain around her colostomy but did not have any recurrence of her hernia. Over the last several months she has noticed pain in her left groin as well as fullness down in her pelvis at her perineal incision.

## 2013-12-28 NOTE — Progress Notes (Signed)
Patient ID: Sherry [name], female   DOB: 1923/09/07, 80 y.o.   MRN: [id number]  Chief Complaint  Patient presents with  . Routine Post Op    reck hernia    HPI Sherry [name] is a 80 y.o. female.  PCP Burnice Logan HPI This is an 80 year old female who is status post APR in January of 2011 for a rectal cancer.  She had a T2 N1 stage III rectal cancer. She was unable to tolerate a full course of chemotherapy. She had a parastomal hernia repaired By laparoscopic approach with mesh using the Sugarbaker method. She had some residual pain around her colostomy but did not have any recurrence of her hernia. Over the last several months she has noticed pain in her left groin as well as fullness down in her pelvis at her perineal incision.She continues to have ostomy output but has had a couple of episodes of a large amount of diarrhea coming out into her colostomy. She states that when she first gets up in the morning she has very little in the way of symptoms. However after standing up for a few minutes she feels the fullness recur in her perineum. Past Medical History  Diagnosis Date  . CAD (coronary artery disease)   . Hyperlipidemia   . Hypertension   . Anemia   . COPD (chronic obstructive pulmonary disease)   . Headache(784.0)   . Hypothyroidism   . Osteoporosis   . Shingles   . Arthritis   . Tendon adhesions     torn tendon right shoulder  . Rectal adenocarcinoma dx'd 08/2009  . Rectal cancer   . Shortness of breath   . GERD (gastroesophageal reflux disease)   . History of blood transfusion     Past Surgical History  Procedure Laterality Date  . Appendectomy    . Cholecystectomy    . Vaginal hysterectomy    . Rectum removal      1/11 DR Jenille Laszlo  . Colon removal      8 " 12/2009 DR Maryruth Apple  . Colon surgery      apr for rectal cancer 2011  . Cardiac catheterization    . Eye surgery      Cataract with lens- bil  . Colostomy    . Parastomal hernia repair  10/30/2012  . Ventral  hernia repair  10/30/2012    Procedure: LAPAROSCOPIC VENTRAL HERNIA;  Surgeon: Imogene Burn. Georgette Dover, MD;  Location: [facility];  Service: General;  Laterality: N/A;  Laparoscopic repair of parastomal hernia    Family History  Problem Relation Age of Onset  . Heart failure Father   . Cancer Mother     HEAD AND NECK  . Heart failure Sister   . Heart failure Brother   . Colon cancer Brother   . Colon cancer Brother   . Cervical cancer      Social History History  Substance Use Topics  . Smoking status: Former Smoker -- 97 years    Quit date: 12/18/2003  . Smokeless tobacco: Never Used     Comment: quit 2005  . Alcohol Use: No    Allergies  Allergen Reactions  . Percocet [Oxycodone-Acetaminophen] Other (See Comments)    Upsets stomach  . Influenza Virus Vacc Split Pf Other (See Comments)    Reaction unknown  . Sulfamethoxazole-Trimethoprim Other (See Comments)    Reaction unknown    Current Outpatient Prescriptions  Medication Sig Dispense Refill  . albuterol (PROVENTIL HFA;VENTOLIN HFA) 108 (90 BASE) MCG/ACT  inhaler Inhale 2 puffs into the lungs every 4 (four) hours as needed. For shortness of breath  3 Inhaler  4  . atorvastatin (LIPITOR) 10 MG tablet Take 1 tablet (10 mg total) by mouth daily.  90 tablet  3  . ciprofloxacin (CIPRO) 500 MG tablet Take 1 tablet (500 mg total) by mouth 2 (two) times daily.  14 tablet  0  . dextromethorphan-guaiFENesin (MUCINEX DM) 30-600 MG per 12 hr tablet Take 1 tablet by mouth every 12 (twelve) hours.      Marland Kitchen diltiazem (TIAZAC) 180 MG 24 hr capsule TAKE 1 CAPSULE DAILY  90 capsule  1  . HYDROcodone-acetaminophen (NORCO/VICODIN) 5-325 MG per tablet Take 1 tablet by mouth every 6 (six) hours as needed for moderate pain.  30 tablet  0  . levothyroxine (SYNTHROID, LEVOTHROID) 75 MCG tablet TAKE 1 TABLET DAILY  90 tablet  1  . metoprolol succinate (TOPROL-XL) 25 MG 24 hr tablet Take 0.5 tablets (12.5 mg total) by mouth daily. 1/2 daily  90 tablet  3  .  oxybutynin (DITROPAN-XL) 10 MG 24 hr tablet Take 10 mg by mouth daily.      . traMADol-acetaminophen (ULTRACET) 37.5-325 MG per tablet Take 1 tablet by mouth every 6 (six) hours as needed. For pain       No current facility-administered medications for this visit.    Review of Systems Review of Systems  Constitutional: Negative for fever, chills and unexpected weight change.  HENT: Negative for congestion, hearing loss, sore throat, trouble swallowing and voice change.   Eyes: Negative for visual disturbance.  Respiratory: Negative for cough and wheezing.   Cardiovascular: Negative for chest pain, palpitations and leg swelling.  Gastrointestinal: Positive for abdominal pain and diarrhea. Negative for nausea, vomiting, constipation, blood in stool, abdominal distention and anal bleeding.  Genitourinary: Positive for pelvic pain. Negative for hematuria, vaginal bleeding and difficulty urinating.  Musculoskeletal: Negative for arthralgias.  Skin: Negative for rash and wound.  Neurological: Negative for seizures, syncope and headaches.  Hematological: Negative for adenopathy. Does not bruise/bleed easily.  Psychiatric/Behavioral: Negative for confusion.    Blood pressure 126/84, pulse 72, temperature 98.7 F (37.1 C), temperature source Temporal, resp. rate 14, height 5' 3.5" (1.613 m), weight 137 lb 12.8 oz (62.506 kg).  Physical Exam Physical Exam WDWN in NAD HEENT:  EOMI, sclera anicteric Neck:  No masses, no thyromegaly Lungs:  CTA bilaterally; normal respiratory effort CV:  Regular rate and rhythm; no murmurs Abd:  +bowel sounds, soft, LLQ colostomy with no obvious parastomal hernia GU:  Large bulge in perineum - reducible Ext:  Well-perfused; no edema Skin:  Warm, dry; no sign of jaundice  Data Reviewed Ct Abdomen Pelvis W Contrast  12/16/2013   CLINICAL DATA:  Persistent left lower quadrant pain. History of rectal cancer with colostomy.  EXAM: CT ABDOMEN AND PELVIS WITH  CONTRAST  TECHNIQUE: Multidetector CT imaging of the abdomen and pelvis was performed using the standard protocol following bolus administration of intravenous contrast.  CONTRAST:  148mL OMNIPAQUE IOHEXOL 300 MG/ML  SOLN  COMPARISON:  07/30/2013  FINDINGS: Lung bases are clear.  Tiny probable hepatic cyst (series 2/images 7). Scattered hepatic granulomata.  Numerous splenic granulomata.  Pancreas is within normal limits.  Stable mild nodular thickening of the bilateral adrenal glands, likely reflecting adrenal hyperplasia.  Status post cholecystectomy. No intrahepatic ductal dilatation. Common duct measures 9 mm and smoothly tapers at the ampulla, likely postsurgical.  Right kidney is malrotated. Scattered bilateral renal cysts  measuring up to 11 mm in the posterior right upper kidney (series 5/ image 14). No hydronephrosis.  Status post abdominoperineal resection with left lower quadrant colostomy. Associated enterocele/prolapse of small bowel through the pelvic floor, either reflecting pelvic floor laxity or frank herniation (series 2/ image 71). This has progressed from the prior. No evidence of bowel obstruction.  Atherosclerotic calcifications of the abdominal aorta and branch vessels. 3.1 x 3.3 cm infrarenal abdominal aortic aneurysm (series 2/ image 25).  No abdominopelvic ascites.  No suspicious abdominopelvic lymphadenopathy.  Status post hysterectomy.  No adnexal masses.  Bladder is notable for a cystocele at rest (sagittal image 73).  Grade 1 anterolisthesis at L4-5.  IMPRESSION: Status post abdominoperineal resection with left lower quadrant colostomy. No evidence of bowel obstruction.  Associated enterocele/prolapse of small bowel through the pelvic floor, either reflecting pelvic floor laxity or frank herniation, progressed from the prior. This may account for the patient's clinical symptoms.  Bladder cystocele at rest.   Electronically Signed   By: Julian Hy M.D.   On: 12/16/2013 15:28    Lab Results  Component Value Date   WBC 5.4 10/01/2013   HGB 12.6 10/01/2013   HCT 37.8 10/01/2013   MCV 89.5 10/01/2013   PLT 240 10/01/2013   Lab Results  Component Value Date   CREATININE 0.7 12/14/2013   BUN 9 12/14/2013   NA 140 12/14/2013   K 3.9 12/14/2013   CL 105 12/14/2013   CO2 24 12/14/2013      Assessment    Pelvic floor hernia causing enterocele, but no sign of obstruction     Plan    I have referred to our colorectal surgeon - Dr. Marcello Moores for evaluation.  She will likely need repair of her pelvic floor to prevent this herniation.  It is possible that her left groin symptoms may be related to this pelvic floor issue.  No sign of recurrence at her stoma.  I will discuss this further with Dr. Marcello Moores after she has had a chance to examine the patient.        Posey Jasmin K. 12/28/2013, 9:49 AM

## 2013-12-31 ENCOUNTER — Other Ambulatory Visit: Payer: Self-pay | Admitting: Internal Medicine

## 2014-01-04 ENCOUNTER — Other Ambulatory Visit: Payer: Self-pay | Admitting: Internal Medicine

## 2014-01-13 ENCOUNTER — Ambulatory Visit (INDEPENDENT_AMBULATORY_CARE_PROVIDER_SITE_OTHER): Payer: Medicare Other | Admitting: General Surgery

## 2014-01-13 ENCOUNTER — Encounter (INDEPENDENT_AMBULATORY_CARE_PROVIDER_SITE_OTHER): Payer: Self-pay | Admitting: General Surgery

## 2014-01-13 VITALS — BP 134/86 | HR 80 | Temp 97.6°F | Resp 24 | Ht 63.0 in | Wt 138.2 lb

## 2014-01-13 DIAGNOSIS — K458 Other specified abdominal hernia without obstruction or gangrene: Secondary | ICD-10-CM | POA: Diagnosis not present

## 2014-01-13 NOTE — Progress Notes (Signed)
Chief Complaint  Patient presents with  . Other    new pt- eval pelvic floor hernia    HISTORY:  Sherry [name] is a 81 y.o. female who presents to the office as a referral from Dr Georgette Dover.  She is s/p APR for rectal cancer in Jan of 2011.   She had a T2 N1 stage III rectal cancer.  She was unable to tolerate a full course of chemotherapy. She does not have any signs of recurrence, but has noticed a "bulge" in her perineal region and some pain in her left side.  She had a parastomal hernia repaired by laparoscopic approach with mesh using the Sugarbaker method. She had some residual pain around her colostomy but did not have any recurrence of her hernia.  She occasionally has trouble with constipation.      Past Medical History  Diagnosis Date  . CAD (coronary artery disease)   . Hyperlipidemia   . Hypertension   . Anemia   . COPD (chronic obstructive pulmonary disease)   . Headache(784.0)   . Hypothyroidism   . Osteoporosis   . Shingles   . Arthritis   . Tendon adhesions     torn tendon right shoulder  . Rectal adenocarcinoma dx'd 08/2009  . Rectal cancer   . Shortness of breath   . GERD (gastroesophageal reflux disease)   . History of blood transfusion        Past Surgical History  Procedure Laterality Date  . Appendectomy    . Cholecystectomy    . Vaginal hysterectomy    . Rectum removal      1/11 DR TSUEI  . Colon removal      8 " 12/2009 DR TSUEI  . Colon surgery      apr for rectal cancer 2011  . Cardiac catheterization    . Eye surgery      Cataract with lens- bil  . Colostomy    . Parastomal hernia repair  10/30/2012  . Ventral hernia repair  10/30/2012    Procedure: LAPAROSCOPIC VENTRAL HERNIA;  Surgeon: Imogene Burn. Georgette Dover, MD;  Location: [facility];  Service: General;  Laterality: N/A;  Laparoscopic repair of parastomal hernia      Current Outpatient Prescriptions  Medication Sig Dispense Refill  . albuterol (PROVENTIL HFA;VENTOLIN HFA) 108 (90 BASE) MCG/ACT inhaler  Inhale 2 puffs into the lungs every 4 (four) hours as needed. For shortness of breath  3 Inhaler  4  . atorvastatin (LIPITOR) 10 MG tablet Take 1 tablet (10 mg total) by mouth daily.  90 tablet  3  . dextromethorphan-guaiFENesin (MUCINEX DM) 30-600 MG per 12 hr tablet Take 1 tablet by mouth every 12 (twelve) hours.      Marland Kitchen diltiazem (CARDIZEM CD) 180 MG 24 hr capsule TAKE 1 CAPSULE DAILY  90 capsule  1  . diltiazem (TIAZAC) 180 MG 24 hr capsule TAKE 1 CAPSULE DAILY  90 capsule  1  . HYDROcodone-acetaminophen (NORCO/VICODIN) 5-325 MG per tablet Take 1 tablet by mouth every 6 (six) hours as needed for moderate pain.  30 tablet  0  . levothyroxine (SYNTHROID, LEVOTHROID) 75 MCG tablet TAKE 1 TABLET DAILY  90 tablet  0  . metoprolol succinate (TOPROL-XL) 25 MG 24 hr tablet Take 0.5 tablets (12.5 mg total) by mouth daily. 1/2 daily  90 tablet  3  . oxybutynin (DITROPAN-XL) 10 MG 24 hr tablet Take 10 mg by mouth daily.      . traMADol-acetaminophen (ULTRACET) 37.5-325 MG  per tablet Take 1 tablet by mouth every 6 (six) hours as needed. For pain       No current facility-administered medications for this visit.     Allergies  Allergen Reactions  . Percocet [Oxycodone-Acetaminophen] Other (See Comments)    Upsets stomach  . Influenza Virus Vacc Split Pf Other (See Comments)    Reaction unknown  . Sulfamethoxazole-Trimethoprim Other (See Comments)    Reaction unknown      Family History  Problem Relation Age of Onset  . Heart failure Father   . Cancer Mother     HEAD AND NECK  . Heart failure Sister   . Heart failure Brother   . Colon cancer Brother   . Colon cancer Brother   . Cervical cancer        History   Social History  . Marital Status: Married    Spouse Name: N/A    Number of Children: 3  . Years of Education: N/A   Social History Main Topics  . Smoking status: Former Smoker -- 91 years    Quit date: 12/18/2003  . Smokeless tobacco: Never Used     Comment: quit 2005  .  Alcohol Use: No  . Drug Use: No  . Sexual Activity: No   Other Topics Concern  . None   Social History Narrative  . None       REVIEW OF SYSTEMS - PERTINENT POSITIVES ONLY: Review of Systems - General ROS: negative for - chills or fever Respiratory ROS: no cough, shortness of breath, or wheezing Cardiovascular ROS: no chest pain or dyspnea on exertion Gastrointestinal ROS: see hpi  EXAM: Filed Vitals:   01/13/14 0929  BP: 134/86  Pulse: 80  Temp: 97.6 F (36.4 C)  Resp: 24    General appearance: alert and cooperative Resp: clear to auscultation bilaterally Cardio: regular rate and rhythm GI: normal findings: soft, non-tender anterior herniation noted in perineal scar, reducible  CT reviewed with patient  ASSESSMENT AND PLAN: Sherry [name] is a 80 y.o. F s/p APR and subsequent parastomal hernia repair.  She now has a herniation through her perineal incision and pelvic floor. We discussed surgical repair and detail. I told her that in general the surgery is even more difficult to overcome than the original abdominal perineal resection. This is mainly due to scarring in the pelvis after surgery. He has a very high risk of complications and her pelvic floor would need to be reconstructed with most likely a biologic mesh. After hearing that she is decided to continue with conservative management. I do not think this is unreasonable at this time, given that she is having minimal symptoms. We discussed ways to minimize progression of her hernia, including wearing compressive garments and stay off her feet as much as possible. She has agreed to the splenic care and will call the office if her symptoms worsen as she would only like to have surgery if her hernia becomes life-threatening condition.    Rosario Adie, MD Colon and Rectal Surgery / [facility]      Visit Diagnoses: 1. Hernia, perineal     Primary Care  Physician: Nyoka Cowden, MD

## 2014-01-13 NOTE — Patient Instructions (Signed)
Call the office if anything changes

## 2014-03-18 ENCOUNTER — Ambulatory Visit: Payer: Medicare Other | Admitting: Internal Medicine

## 2014-03-30 ENCOUNTER — Ambulatory Visit (INDEPENDENT_AMBULATORY_CARE_PROVIDER_SITE_OTHER): Payer: Medicare Other | Admitting: Internal Medicine

## 2014-03-30 ENCOUNTER — Encounter: Payer: Self-pay | Admitting: Internal Medicine

## 2014-03-30 VITALS — BP 140/80 | HR 70 | Temp 98.6°F | Resp 18 | Ht 63.0 in | Wt 140.0 lb

## 2014-03-30 DIAGNOSIS — Z23 Encounter for immunization: Secondary | ICD-10-CM | POA: Diagnosis not present

## 2014-03-30 DIAGNOSIS — E039 Hypothyroidism, unspecified: Secondary | ICD-10-CM | POA: Diagnosis not present

## 2014-03-30 DIAGNOSIS — E785 Hyperlipidemia, unspecified: Secondary | ICD-10-CM | POA: Diagnosis not present

## 2014-03-30 DIAGNOSIS — I10 Essential (primary) hypertension: Secondary | ICD-10-CM | POA: Diagnosis not present

## 2014-03-30 DIAGNOSIS — C20 Malignant neoplasm of rectum: Secondary | ICD-10-CM

## 2014-03-30 MED ORDER — TRAMADOL-ACETAMINOPHEN 37.5-325 MG PO TABS: 1.0000 | ORAL_TABLET | Freq: Four times a day (QID) | ORAL | Status: DC | PRN

## 2014-03-30 NOTE — Progress Notes (Signed)
Subjective:    Patient ID: Sherry [name], female    DOB: [birth year], 80 y.o.   MRN: [id number]  HPI  80 year old patient who has a history of rectal cancer and who has been seen by surgery.  Recently, due to a perineal  Hernia.  She continues to do fairly well.  She is describing some reflux symptoms today, but otherwise doing fairly well.  She has a history of hypertension and dyslipidemia.  She has treated hypothyroidism  Past Medical History  Diagnosis Date  . CAD (coronary artery disease)   . Hyperlipidemia   . Hypertension   . Anemia   . COPD (chronic obstructive pulmonary disease)   . Headache(784.0)   . Hypothyroidism   . Osteoporosis   . Shingles   . Arthritis   . Tendon adhesions     torn tendon right shoulder  . Rectal adenocarcinoma dx'd 08/2009  . Rectal cancer   . Shortness of breath   . GERD (gastroesophageal reflux disease)   . History of blood transfusion     History   Social History  . Marital Status: Married    Spouse Name: N/A    Number of Children: 3  . Years of Education: N/A   Occupational History  . Not on file.   Social History Main Topics  . Smoking status: Former Smoker -- 29 years    Quit date: 12/18/2003  . Smokeless tobacco: Never Used     Comment: quit 2005  . Alcohol Use: No  . Drug Use: No  . Sexual Activity: No   Other Topics Concern  . Not on file   Social History Narrative  . No narrative on file    Past Surgical History  Procedure Laterality Date  . Appendectomy    . Cholecystectomy    . Vaginal hysterectomy    . Rectum removal      1/11 DR TSUEI  . Colon removal      8 " 12/2009 DR TSUEI  . Colon surgery      apr for rectal cancer 2011  . Cardiac catheterization    . Eye surgery      Cataract with lens- bil  . Colostomy    . Parastomal hernia repair  10/30/2012  . Ventral hernia repair  10/30/2012    Procedure: LAPAROSCOPIC VENTRAL HERNIA;  Surgeon: Imogene Burn. Georgette Dover, MD;  Location: [facility];  Service: General;   Laterality: N/A;  Laparoscopic repair of parastomal hernia    Family History  Problem Relation Age of Onset  . Heart failure Father   . Cancer Mother     HEAD AND NECK  . Heart failure Sister   . Heart failure Brother   . Colon cancer Brother   . Colon cancer Brother   . Cervical cancer      Allergies  Allergen Reactions  . Percocet [Oxycodone-Acetaminophen] Other (See Comments)    Upsets stomach  . Influenza Virus Vacc Split Pf Other (See Comments)    Reaction unknown  . Sulfamethoxazole-Trimethoprim Other (See Comments)    Reaction unknown    Current Outpatient Prescriptions on File Prior to Visit  Medication Sig Dispense Refill  . albuterol (PROVENTIL HFA;VENTOLIN HFA) 108 (90 BASE) MCG/ACT inhaler Inhale 2 puffs into the lungs every 4 (four) hours as needed. For shortness of breath  3 Inhaler  4  . atorvastatin (LIPITOR) 10 MG tablet Take 1 tablet (10 mg total) by mouth daily.  90 tablet  3  . dextromethorphan-guaiFENesin (  MUCINEX DM) 30-600 MG per 12 hr tablet Take 1 tablet by mouth every 12 (twelve) hours.      Marland Kitchen diltiazem (TIAZAC) 180 MG 24 hr capsule TAKE 1 CAPSULE DAILY  90 capsule  1  . levothyroxine (SYNTHROID, LEVOTHROID) 75 MCG tablet TAKE 1 TABLET DAILY  90 tablet  0  . metoprolol succinate (TOPROL-XL) 25 MG 24 hr tablet Take 0.5 tablets (12.5 mg total) by mouth daily. 1/2 daily  90 tablet  3  . oxybutynin (DITROPAN-XL) 10 MG 24 hr tablet Take 10 mg by mouth daily.       No current facility-administered medications on file prior to visit.    BP 140/80  Pulse 70  Temp(Src) 98.6 F (37 C) (Oral)  Resp 18  Ht 5\' 3"  (1.6 m)  Wt 140 lb (63.504 kg)  BMI 24.81 kg/m2  SpO2 95%   Wt Readings from Last 3 Encounters:  03/30/14 140 lb (63.504 kg)  01/13/14 138 lb 3.2 oz (62.687 kg)  12/28/13 137 lb 12.8 oz (62.506 kg)      Review of Systems  Constitutional: Negative.   HENT: Negative for congestion, dental problem, hearing loss, rhinorrhea, sinus pressure,  sore throat and tinnitus.   Eyes: Negative for pain, discharge and visual disturbance.  Respiratory: Negative for cough and shortness of breath.   Cardiovascular: Negative for chest pain, palpitations and leg swelling.  Gastrointestinal: Negative for nausea, vomiting, abdominal pain, diarrhea, constipation, blood in stool and abdominal distention.  Genitourinary: Negative for dysuria, urgency, frequency, hematuria, flank pain, vaginal bleeding, vaginal discharge, difficulty urinating, vaginal pain and pelvic pain.  Musculoskeletal: Negative for arthralgias, gait problem and joint swelling.  Skin: Negative for rash.  Neurological: Negative for dizziness, syncope, speech difficulty, weakness, numbness and headaches.  Hematological: Negative for adenopathy.  Psychiatric/Behavioral: Negative for behavioral problems, dysphoric mood and agitation. The patient is not nervous/anxious.        Objective:   Physical Exam  Constitutional: She is oriented to person, place, and time. She appears well-developed and well-nourished.  HENT:  Head: Normocephalic.  Right Ear: External ear normal.  Left Ear: External ear normal.  Mouth/Throat: Oropharynx is clear and moist.  Eyes: Conjunctivae and EOM are normal. Pupils are equal, round, and reactive to light.  Neck: Normal range of motion. Neck supple. No thyromegaly present.  Cardiovascular: Normal rate, regular rhythm, normal heart sounds and intact distal pulses.   Pulmonary/Chest: Effort normal and breath sounds normal.  Rare bibasilar crackles  Abdominal: Soft. Bowel sounds are normal. She exhibits distension. She exhibits no mass. There is no tenderness.  Musculoskeletal: Normal range of motion.  Lymphadenopathy:    She has no cervical adenopathy.  Neurological: She is alert and oriented to person, place, and time.  Skin: Skin is warm and dry. No rash noted.  Psychiatric: She has a normal mood and affect. Her behavior is normal.            Assessment & Plan:   Hypertension well controlled COPD stable Dyslipidemia.  Continue with atorvastatin Reflux symptoms.  We'll try short-term PPI therapy

## 2014-03-30 NOTE — Patient Instructions (Signed)
Limit your sodium (Salt) intake  Avoids foods high in acid such as tomatoes citrus juices, and spicy foods.  Avoid eating within two hours of lying down or before exercising.  Do not overheat.  Try smaller more frequent meals.  If symptoms persist, elevate the head of her bed 12 inches while sleeping.

## 2014-03-30 NOTE — Progress Notes (Signed)
Pre-visit discussion using our clinic review tool. No additional management support is needed unless otherwise documented below in the visit note.  

## 2014-03-31 ENCOUNTER — Telehealth: Payer: Self-pay | Admitting: Internal Medicine

## 2014-03-31 NOTE — Telephone Encounter (Signed)
Relevant patient education mailed to patient.  

## 2014-04-08 ENCOUNTER — Telehealth: Payer: Self-pay | Admitting: Internal Medicine

## 2014-04-08 NOTE — Telephone Encounter (Signed)
Dr. Raliegh Ip, okay to send Rx for Dexilant?

## 2014-04-08 NOTE — Telephone Encounter (Signed)
Pt called and would like a rx for dexilant she said she was given samples and it worked for her

## 2014-04-09 MED ORDER — DEXLANSOPRAZOLE 60 MG PO CPDR: 60.0000 mg | DELAYED_RELEASE_CAPSULE | Freq: Every day | ORAL | Status: DC

## 2014-04-09 NOTE — Telephone Encounter (Signed)
ok 

## 2014-04-09 NOTE — Telephone Encounter (Signed)
Spoke to pt told her okay to fill Rx for Dexilant 60 mg will send to pharmacy. Pt verbalized understanding and asked to send Rx to [facility]. Told pt okay. Rx sent.

## 2014-05-07 ENCOUNTER — Ambulatory Visit (INDEPENDENT_AMBULATORY_CARE_PROVIDER_SITE_OTHER): Payer: Medicare Other | Admitting: Internal Medicine

## 2014-05-07 ENCOUNTER — Encounter: Payer: Self-pay | Admitting: Internal Medicine

## 2014-05-07 VITALS — BP 160/90 | HR 78 | Temp 98.7°F | Wt 135.0 lb

## 2014-05-07 DIAGNOSIS — I10 Essential (primary) hypertension: Secondary | ICD-10-CM | POA: Diagnosis not present

## 2014-05-07 DIAGNOSIS — E039 Hypothyroidism, unspecified: Secondary | ICD-10-CM | POA: Diagnosis not present

## 2014-05-07 DIAGNOSIS — J449 Chronic obstructive pulmonary disease, unspecified: Secondary | ICD-10-CM

## 2014-05-07 DIAGNOSIS — C20 Malignant neoplasm of rectum: Secondary | ICD-10-CM

## 2014-05-07 DIAGNOSIS — J069 Acute upper respiratory infection, unspecified: Secondary | ICD-10-CM

## 2014-05-07 MED ORDER — BENZONATATE 200 MG PO CAPS: 200.0000 mg | ORAL_CAPSULE | Freq: Two times a day (BID) | ORAL | Status: DC | PRN

## 2014-05-07 NOTE — Patient Instructions (Signed)
Acute bronchitis symptoms for less than 10 days are generally not helped by antibiotics.  Take over-the-counter expectorants and cough medications such as  Mucinex DM.  Call if there is no improvement in 5 to 7 days or if  you develop worsening cough, fever, or new symptoms, such as shortness of breath or chest pain.   

## 2014-05-07 NOTE — Progress Notes (Signed)
Subjective:    Patient ID: Sherry [name], female    DOB: 09-19-1923, 80 y.o.   MRN: [id number]  HPI  80 year old patient, who presents with a several day history of significant cough.  She has developed some abdominal discomfort, bloating, and also some interscapular back pain.  She has also developed a bulge superior to her colostomy.  She has required a laparoscopic surgical procedure for a peristomal hernia in the past.  Initially, she had considerable diarrhea, but this has resolved.  Past Medical History  Diagnosis Date  . CAD (coronary artery disease)   . Hyperlipidemia   . Hypertension   . Anemia   . COPD (chronic obstructive pulmonary disease)   . Headache(784.0)   . Hypothyroidism   . Osteoporosis   . Shingles   . Arthritis   . Tendon adhesions     torn tendon right shoulder  . Rectal adenocarcinoma dx'd 08/2009  . Rectal cancer   . Shortness of breath   . GERD (gastroesophageal reflux disease)   . History of blood transfusion     History   Social History  . Marital Status: Married    Spouse Name: N/A    Number of Children: 3  . Years of Education: N/A   Occupational History  . Not on file.   Social History Main Topics  . Smoking status: Former Smoker -- 74 years    Quit date: 12/18/2003  . Smokeless tobacco: Never Used     Comment: quit 2005  . Alcohol Use: No  . Drug Use: No  . Sexual Activity: No   Other Topics Concern  . Not on file   Social History Narrative  . No narrative on file    Past Surgical History  Procedure Laterality Date  . Appendectomy    . Cholecystectomy    . Vaginal hysterectomy    . Rectum removal      1/11 DR TSUEI  . Colon removal      8 " 12/2009 DR TSUEI  . Colon surgery      apr for rectal cancer 2011  . Cardiac catheterization    . Eye surgery      Cataract with lens- bil  . Colostomy    . Parastomal hernia repair  10/30/2012  . Ventral hernia repair  10/30/2012    Procedure: LAPAROSCOPIC VENTRAL HERNIA;   Surgeon: Imogene Burn. Georgette Dover, MD;  Location: [facility];  Service: General;  Laterality: N/A;  Laparoscopic repair of parastomal hernia    Family History  Problem Relation Age of Onset  . Heart failure Father   . Cancer Mother     HEAD AND NECK  . Heart failure Sister   . Heart failure Brother   . Colon cancer Brother   . Colon cancer Brother   . Cervical cancer      Allergies  Allergen Reactions  . Percocet [Oxycodone-Acetaminophen] Other (See Comments)    Upsets stomach  . Influenza Virus Vacc Split Pf Other (See Comments)    Reaction unknown  . Sulfamethoxazole-Trimethoprim Other (See Comments)    Reaction unknown    Current Outpatient Prescriptions on File Prior to Visit  Medication Sig Dispense Refill  . albuterol (PROVENTIL HFA;VENTOLIN HFA) 108 (90 BASE) MCG/ACT inhaler Inhale 2 puffs into the lungs every 4 (four) hours as needed. For shortness of breath  3 Inhaler  4  . atorvastatin (LIPITOR) 10 MG tablet Take 1 tablet (10 mg total) by mouth daily.  90 tablet  3  . dexlansoprazole (DEXILANT) 60 MG capsule Take 1 capsule (60 mg total) by mouth daily.  90 capsule  3  . dextromethorphan-guaiFENesin (MUCINEX DM) 30-600 MG per 12 hr tablet Take 1 tablet by mouth every 12 (twelve) hours.      Marland Kitchen diltiazem (TIAZAC) 180 MG 24 hr capsule TAKE 1 CAPSULE DAILY  90 capsule  1  . levothyroxine (SYNTHROID, LEVOTHROID) 75 MCG tablet TAKE 1 TABLET DAILY  90 tablet  0  . metoprolol succinate (TOPROL-XL) 25 MG 24 hr tablet Take 0.5 tablets (12.5 mg total) by mouth daily. 1/2 daily  90 tablet  3  . oxybutynin (DITROPAN-XL) 10 MG 24 hr tablet Take 10 mg by mouth daily.      . traMADol-acetaminophen (ULTRACET) 37.5-325 MG per tablet Take 1 tablet by mouth every 6 (six) hours as needed for moderate pain.  60 tablet  2   No current facility-administered medications on file prior to visit.    BP 160/90  Pulse 78  Temp(Src) 98.7 F (37.1 C) (Oral)  Wt 135 lb (61.236 kg)  SpO2 95%     Review  of Systems  Constitutional: Negative.   HENT: Negative for congestion, dental problem, hearing loss, rhinorrhea, sinus pressure, sore throat and tinnitus.   Eyes: Negative for pain, discharge and visual disturbance.  Respiratory: Negative for cough and shortness of breath.   Cardiovascular: Negative for chest pain, palpitations and leg swelling.  Gastrointestinal: Positive for nausea, abdominal pain and diarrhea. Negative for vomiting, constipation, blood in stool and abdominal distention.  Genitourinary: Negative for dysuria, urgency, frequency, hematuria, flank pain, vaginal bleeding, vaginal discharge, difficulty urinating, vaginal pain and pelvic pain.  Musculoskeletal: Positive for back pain. Negative for arthralgias, gait problem and joint swelling.  Skin: Negative for rash.  Neurological: Negative for dizziness, syncope, speech difficulty, weakness, numbness and headaches.  Hematological: Negative for adenopathy.  Psychiatric/Behavioral: Negative for behavioral problems, dysphoric mood and agitation. The patient is not nervous/anxious.        Objective:   Physical Exam  Constitutional: She is oriented to person, place, and time. She appears well-developed and well-nourished.  HENT:  Head: Normocephalic.  Right Ear: External ear normal.  Left Ear: External ear normal.  Mouth/Throat: Oropharynx is clear and moist.  Eyes: Conjunctivae and EOM are normal. Pupils are equal, round, and reactive to light.  Neck: Normal range of motion. Neck supple. No thyromegaly present.  Cardiovascular: Normal rate, regular rhythm, normal heart sounds and intact distal pulses.   Pulmonary/Chest: Effort normal and breath sounds normal.  Abdominal: Soft. Bowel sounds are normal. She exhibits no mass. There is no tenderness.  Superior para stomal hernia  Musculoskeletal: Normal range of motion.  Lymphadenopathy:    She has no cervical adenopathy.  Neurological: She is alert and oriented to person,  place, and time.  Skin: Skin is warm and dry. No rash noted.  Psychiatric: She has a normal mood and affect. Her behavior is normal.          Assessment & Plan:  Viral URI with cough Recurrent Parastomal hernia  Hypertension.  Repeat blood pressure 140/80, improved.  We'll treat symptomatically

## 2014-06-16 ENCOUNTER — Encounter: Payer: Self-pay | Admitting: Gastroenterology

## 2014-07-12 ENCOUNTER — Other Ambulatory Visit: Payer: Self-pay | Admitting: Internal Medicine

## 2014-07-19 ENCOUNTER — Ambulatory Visit (INDEPENDENT_AMBULATORY_CARE_PROVIDER_SITE_OTHER): Payer: Medicare Other | Admitting: Ophthalmology

## 2014-07-19 DIAGNOSIS — I10 Essential (primary) hypertension: Secondary | ICD-10-CM | POA: Diagnosis not present

## 2014-07-19 DIAGNOSIS — H35039 Hypertensive retinopathy, unspecified eye: Secondary | ICD-10-CM | POA: Diagnosis not present

## 2014-07-19 DIAGNOSIS — H32 Chorioretinal disorders in diseases classified elsewhere: Secondary | ICD-10-CM | POA: Diagnosis not present

## 2014-07-19 DIAGNOSIS — H35349 Macular cyst, hole, or pseudohole, unspecified eye: Secondary | ICD-10-CM

## 2014-07-19 DIAGNOSIS — H43819 Vitreous degeneration, unspecified eye: Secondary | ICD-10-CM

## 2014-07-19 DIAGNOSIS — B399 Histoplasmosis, unspecified: Secondary | ICD-10-CM | POA: Diagnosis not present

## 2014-09-06 ENCOUNTER — Telehealth: Payer: Self-pay | Admitting: Internal Medicine

## 2014-09-06 NOTE — Telephone Encounter (Signed)
[facility] is requesting 90 day re-fill on atorvastatin (LIPITOR) 10 MG tablet

## 2014-09-07 MED ORDER — ATORVASTATIN CALCIUM 10 MG PO TABS: 10.0000 mg | ORAL_TABLET | Freq: Every day | ORAL | Status: DC

## 2014-09-07 NOTE — Telephone Encounter (Signed)
Rx sent 

## 2014-09-12 ENCOUNTER — Other Ambulatory Visit: Payer: Self-pay | Admitting: Internal Medicine

## 2014-09-21 ENCOUNTER — Other Ambulatory Visit: Payer: Self-pay | Admitting: Internal Medicine

## 2014-09-29 ENCOUNTER — Ambulatory Visit (INDEPENDENT_AMBULATORY_CARE_PROVIDER_SITE_OTHER): Payer: Medicare Other | Admitting: Internal Medicine

## 2014-09-29 ENCOUNTER — Encounter: Payer: Self-pay | Admitting: Internal Medicine

## 2014-09-29 VITALS — BP 140/80 | HR 52 | Temp 98.1°F | Resp 20 | Ht 63.0 in | Wt 133.0 lb

## 2014-09-29 DIAGNOSIS — E038 Other specified hypothyroidism: Secondary | ICD-10-CM | POA: Diagnosis not present

## 2014-09-29 DIAGNOSIS — E785 Hyperlipidemia, unspecified: Secondary | ICD-10-CM | POA: Diagnosis not present

## 2014-09-29 DIAGNOSIS — C20 Malignant neoplasm of rectum: Secondary | ICD-10-CM

## 2014-09-29 DIAGNOSIS — N815 Vaginal enterocele: Secondary | ICD-10-CM

## 2014-09-29 DIAGNOSIS — Z0000 Encounter for general adult medical examination without abnormal findings: Secondary | ICD-10-CM

## 2014-09-29 DIAGNOSIS — E034 Atrophy of thyroid (acquired): Secondary | ICD-10-CM

## 2014-09-29 DIAGNOSIS — I10 Essential (primary) hypertension: Secondary | ICD-10-CM

## 2014-09-29 LAB — CBC WITH DIFFERENTIAL/PLATELET
BASOS PCT: 0.5 % (ref 0.0–3.0)
Basophils Absolute: 0 10*3/uL (ref 0.0–0.1)
Eosinophils Absolute: 0.1 10*3/uL (ref 0.0–0.7)
Eosinophils Relative: 1.4 % (ref 0.0–5.0)
HCT: 38.2 % (ref 36.0–46.0)
HEMOGLOBIN: 12.5 g/dL (ref 12.0–15.0)
LYMPHS PCT: 11.5 % — AB (ref 12.0–46.0)
Lymphs Abs: 0.6 10*3/uL — ABNORMAL LOW (ref 0.7–4.0)
MCHC: 32.8 g/dL (ref 30.0–36.0)
MCV: 98 fl (ref 78.0–100.0)
Monocytes Absolute: 0.5 10*3/uL (ref 0.1–1.0)
Monocytes Relative: 8.8 % (ref 3.0–12.0)
NEUTROS ABS: 4 10*3/uL (ref 1.4–7.7)
NEUTROS PCT: 77.8 % — AB (ref 43.0–77.0)
Platelets: 236 10*3/uL (ref 150.0–400.0)
RBC: 3.89 Mil/uL (ref 3.87–5.11)
RDW: 15.5 % (ref 11.5–15.5)
WBC: 5.1 10*3/uL (ref 4.0–10.5)

## 2014-09-29 LAB — LIPID PANEL
CHOL/HDL RATIO: 3
CHOLESTEROL: 135 mg/dL (ref 0–200)
HDL: 39.2 mg/dL (ref >39.00)
LDL Cholesterol: 77 mg/dL (ref 0–99)
NonHDL: 95.8
Triglycerides: 96 mg/dL (ref 0.0–149.0)
VLDL: 19.2 mg/dL (ref 0.0–40.0)

## 2014-09-29 LAB — COMPREHENSIVE METABOLIC PANEL
ALBUMIN: 3.7 g/dL (ref 3.5–5.2)
ALT: 13 U/L (ref 0–35)
AST: 18 U/L (ref 0–37)
Alkaline Phosphatase: 114 U/L (ref 39–117)
BUN: 12 mg/dL (ref 6–23)
CO2: 28 mEq/L (ref 19–32)
Calcium: 10.3 mg/dL (ref 8.4–10.5)
Chloride: 103 mEq/L (ref 96–112)
Creatinine, Ser: 0.7 mg/dL (ref 0.4–1.2)
GFR: 82.38 mL/min (ref >60.00)
GLUCOSE: 91 mg/dL (ref 70–99)
POTASSIUM: 3.6 meq/L (ref 3.5–5.1)
Sodium: 136 mEq/L (ref 135–145)
Total Bilirubin: 0.7 mg/dL (ref 0.2–1.2)
Total Protein: 7.3 g/dL (ref 6.0–8.3)

## 2014-09-29 LAB — TSH: TSH: 4.04 u[IU]/mL (ref 0.35–4.50)

## 2014-09-29 MED ORDER — TRAMADOL-ACETAMINOPHEN 37.5-325 MG PO TABS: 1.0000 | ORAL_TABLET | Freq: Four times a day (QID) | ORAL | Status: DC | PRN

## 2014-09-29 MED ORDER — METOPROLOL SUCCINATE ER 25 MG PO TB24: ORAL_TABLET | ORAL | Status: DC

## 2014-09-29 MED ORDER — ATORVASTATIN CALCIUM 10 MG PO TABS: 10.0000 mg | ORAL_TABLET | Freq: Every day | ORAL | Status: DC

## 2014-09-29 MED ORDER — ALBUTEROL SULFATE HFA 108 (90 BASE) MCG/ACT IN AERS: 2.0000 | INHALATION_SPRAY | RESPIRATORY_TRACT | Status: DC | PRN

## 2014-09-29 MED ORDER — OXYBUTYNIN CHLORIDE ER 10 MG PO TB24: 10.0000 mg | ORAL_TABLET | Freq: Every day | ORAL | Status: DC

## 2014-09-29 MED ORDER — LEVOTHYROXINE SODIUM 75 MCG PO TABS: ORAL_TABLET | ORAL | Status: DC

## 2014-09-29 NOTE — Progress Notes (Signed)
Pre visit review using our clinic review tool, if applicable. No additional management support is needed unless otherwise documented below in the visit note. 

## 2014-09-29 NOTE — Patient Instructions (Signed)
Limit your sodium (Salt) intake  Oncology followup as scheduled  Return in 6 months for follow-up Health Maintenance Adopting a healthy lifestyle and getting preventive care can go a long way to promote health and wellness. Talk with your health care provider about what schedule of regular examinations is right for you. This is a good chance for you to check in with your provider about disease prevention and staying healthy. In between checkups, there are plenty of things you can do on your own. Experts have done a lot of research about which lifestyle changes and preventive measures are most likely to keep you healthy. Ask your health care provider for more information. WEIGHT AND DIET  Eat a healthy diet  Be sure to include plenty of vegetables, fruits, low-fat dairy products, and lean protein.  Do not eat a lot of foods high in solid fats, added sugars, or salt.  Get regular exercise. This is one of the most important things you can do for your health.  Most adults should exercise for at least 150 minutes each week. The exercise should increase your heart rate and make you sweat (moderate-intensity exercise).  Most adults should also do strengthening exercises at least twice a week. This is in addition to the moderate-intensity exercise.  Maintain a healthy weight  Body mass index (BMI) is a measurement that can be used to identify possible weight problems. It estimates body fat based on height and weight. Your health care provider can help determine your BMI and help you achieve or maintain a healthy weight.  For females 11 years of age and older:   A BMI below 18.5 is considered underweight.  A BMI of 18.5 to 24.9 is normal.  A BMI of 25 to 29.9 is considered overweight.  A BMI of 30 and above is considered obese.  Watch levels of cholesterol and blood lipids  You should start having your blood tested for lipids and cholesterol at 78 years of age, then have this test every  5 years.  You may need to have your cholesterol levels checked more often if:  Your lipid or cholesterol levels are high.  You are older than 78 years of age.  You are at high risk for heart disease.  CANCER SCREENING   Lung Cancer  Lung cancer screening is recommended for adults 22-78 years old who are at high risk for lung cancer because of a history of smoking.  A yearly low-dose CT scan of the lungs is recommended for people who:  Currently smoke.  Have quit within the past 15 years.  Have at least a 30-pack-year history of smoking. A pack year is smoking an average of one pack of cigarettes a day for 1 year.  Yearly screening should continue until it has been 15 years since you quit.  Yearly screening should stop if you develop a health problem that would prevent you from having lung cancer treatment.  Breast Cancer  Practice breast self-awareness. This means understanding how your breasts normally appear and feel.  It also means doing regular breast self-exams. Let your health care provider know about any changes, no matter how small.  If you are in your 20s or 30s, you should have a clinical breast exam (CBE) by a health care provider every 1-3 years as part of a regular health exam.  If you are 85 or older, have a CBE every year. Also consider having a breast X-ray (mammogram) every year.  If you have a family  history of breast cancer, talk to your health care provider about genetic screening.  If you are at high risk for breast cancer, talk to your health care provider about having an MRI and a mammogram every year.  Breast cancer gene (BRCA) assessment is recommended for women who have family members with BRCA-related cancers. BRCA-related cancers include:  Breast.  Ovarian.  Tubal.  Peritoneal cancers.  Results of the assessment will determine the need for genetic counseling and BRCA1 and BRCA2 testing. Cervical Cancer Routine pelvic examinations to  screen for cervical cancer are no longer recommended for nonpregnant women who are considered low risk for cancer of the pelvic organs (ovaries, uterus, and vagina) and who do not have symptoms. A pelvic examination may be necessary if you have symptoms including those associated with pelvic infections. Ask your health care provider if a screening pelvic exam is right for you.   The Pap test is the screening test for cervical cancer for women who are considered at risk.  If you had a hysterectomy for a problem that was not cancer or a condition that could lead to cancer, then you no longer need Pap tests.  If you are older than 65 years, and you have had normal Pap tests for the past 10 years, you no longer need to have Pap tests.  If you have had past treatment for cervical cancer or a condition that could lead to cancer, you need Pap tests and screening for cancer for at least 20 years after your treatment.  If you no longer get a Pap test, assess your risk factors if they change (such as having a new sexual partner). This can affect whether you should start being screened again.  Some women have medical problems that increase their chance of getting cervical cancer. If this is the case for you, your health care provider may recommend more frequent screening and Pap tests.  The human papillomavirus (HPV) test is another test that may be used for cervical cancer screening. The HPV test looks for the virus that can cause cell changes in the cervix. The cells collected during the Pap test can be tested for HPV.  The HPV test can be used to screen women 57 years of age and older. Getting tested for HPV can extend the interval between normal Pap tests from three to five years.  An HPV test also should be used to screen women of any age who have unclear Pap test results.  After 78 years of age, women should have HPV testing as often as Pap tests.  Colorectal Cancer  This type of cancer can be  detected and often prevented.  Routine colorectal cancer screening usually begins at 78 years of age and continues through 78 years of age.  Your health care provider may recommend screening at an earlier age if you have risk factors for colon cancer.  Your health care provider may also recommend using home test kits to check for hidden blood in the stool.  A small camera at the end of a tube can be used to examine your colon directly (sigmoidoscopy or colonoscopy). This is done to check for the earliest forms of colorectal cancer.  Routine screening usually begins at age 66.  Direct examination of the colon should be repeated every 5-10 years through 78 years of age. However, you may need to be screened more often if early forms of precancerous polyps or small growths are found. Skin Cancer  Check your skin from  head to toe regularly.  Tell your health care provider about any new moles or changes in moles, especially if there is a change in a mole's shape or color.  Also tell your health care provider if you have a mole that is larger than the size of a pencil eraser.  Always use sunscreen. Apply sunscreen liberally and repeatedly throughout the day.  Protect yourself by wearing long sleeves, pants, a wide-brimmed hat, and sunglasses whenever you are outside. HEART DISEASE, DIABETES, AND HIGH BLOOD PRESSURE   Have your blood pressure checked at least every 1-2 years. High blood pressure causes heart disease and increases the risk of stroke.  If you are between 35 years and 50 years old, ask your health care provider if you should take aspirin to prevent strokes.  Have regular diabetes screenings. This involves taking a blood sample to check your fasting blood sugar level.  If you are at a normal weight and have a low risk for diabetes, have this test once every three years after 78 years of age.  If you are overweight and have a high risk for diabetes, consider being tested at a  younger age or more often. PREVENTING INFECTION  Hepatitis B  If you have a higher risk for hepatitis B, you should be screened for this virus. You are considered at high risk for hepatitis B if:  You were born in a country where hepatitis B is common. Ask your health care provider which countries are considered high risk.  Your parents were born in a high-risk country, and you have not been immunized against hepatitis B (hepatitis B vaccine).  You have HIV or AIDS.  You use needles to inject street drugs.  You live with someone who has hepatitis B.  You have had sex with someone who has hepatitis B.  You get hemodialysis treatment.  You take certain medicines for conditions, including cancer, organ transplantation, and autoimmune conditions. Hepatitis C  Blood testing is recommended for:  Everyone born from [birth year] through 1963.  Anyone with known risk factors for hepatitis C. Sexually transmitted infections (STIs)  You should be screened for sexually transmitted infections (STIs) including gonorrhea and chlamydia if:  You are sexually active and are younger than 78 years of age.  You are older than 78 years of age and your health care provider tells you that you are at risk for this type of infection.  Your sexual activity has changed since you were last screened and you are at an increased risk for chlamydia or gonorrhea. Ask your health care provider if you are at risk.  If you do not have HIV, but are at risk, it may be recommended that you take a prescription medicine daily to prevent HIV infection. This is called pre-exposure prophylaxis (PrEP). You are considered at risk if:  You are sexually active and do not regularly use condoms or know the HIV status of your partner(s).  You take drugs by injection.  You are sexually active with a partner who has HIV. Talk with your health care provider about whether you are at high risk of being infected with HIV. If you choose  to begin PrEP, you should first be tested for HIV. You should then be tested every 3 months for as long as you are taking PrEP.  PREGNANCY   If you are premenopausal and you may become pregnant, ask your health care provider about preconception counseling.  If you may become pregnant, take 400 to 800  micrograms (mcg) of folic acid every day.  If you want to prevent pregnancy, talk to your health care provider about birth control (contraception). OSTEOPOROSIS AND MENOPAUSE   Osteoporosis is a disease in which the bones lose minerals and strength with aging. This can result in serious bone fractures. Your risk for osteoporosis can be identified using a bone density scan.  If you are 70 years of age or older, or if you are at risk for osteoporosis and fractures, ask your health care provider if you should be screened.  Ask your health care provider whether you should take a calcium or vitamin D supplement to lower your risk for osteoporosis.  Menopause may have certain physical symptoms and risks.  Hormone replacement therapy may reduce some of these symptoms and risks. Talk to your health care provider about whether hormone replacement therapy is right for you.  HOME CARE INSTRUCTIONS   Schedule regular health, dental, and eye exams.  Stay current with your immunizations.   Do not use any tobacco products including cigarettes, chewing tobacco, or electronic cigarettes.  If you are pregnant, do not drink alcohol.  If you are breastfeeding, limit how much and how often you drink alcohol.  Limit alcohol intake to no more than 1 drink per day for nonpregnant women. One drink equals 12 ounces of beer, 5 ounces of wine, or 1 ounces of hard liquor.  Do not use street drugs.  Do not share needles.  Ask your health care provider for help if you need support or information about quitting drugs.  Tell your health care provider if you often feel depressed.  Tell your health care  provider if you have ever been abused or do not feel safe at home. Document Released: 06/18/2011 Document Revised: 04/19/2014 Document Reviewed: 11/04/2013 [facility] Patient Information 2015 [place], [place]. This information is not intended to replace advice given to you by your health care provider. Make sure you discuss any questions you have with your health care provider.

## 2014-09-29 NOTE — Progress Notes (Signed)
Subjective:    Patient ID: Sherry [name], female    DOB: [birth year], 80 y.o.   MRN: [id number]  HPI  80 year old patient who is seen today for a preventive health examination.  She is followed closely by oncology and general surgery.  She has a prior history of rectal cancer. She has hypertension, hypothyroidism. In general, doing fairly well.  She continues to have some interscapular back pain.  She is scheduled for oncology followup in 2 days.  She continues to have some rectal pain related to a enterocele.  Past Medical History  Diagnosis Date  . CAD (coronary artery disease)   . Hyperlipidemia   . Hypertension   . Anemia   . COPD (chronic obstructive pulmonary disease)   . Headache(784.0)   . Hypothyroidism   . Osteoporosis   . Shingles   . Arthritis   . Tendon adhesions     torn tendon right shoulder  . Rectal adenocarcinoma dx'd 08/2009  . Rectal cancer   . Shortness of breath   . GERD (gastroesophageal reflux disease)   . History of blood transfusion     History   Social History  . Marital Status: Married    Spouse Name: N/A    Number of Children: 3  . Years of Education: N/A   Occupational History  . Not on file.   Social History Main Topics  . Smoking status: Former Smoker -- 61 years    Quit date: 12/18/2003  . Smokeless tobacco: Never Used     Comment: quit 2005  . Alcohol Use: No  . Drug Use: No  . Sexual Activity: No   Other Topics Concern  . Not on file   Social History Narrative  . No narrative on file    Past Surgical History  Procedure Laterality Date  . Appendectomy    . Cholecystectomy    . Vaginal hysterectomy    . Rectum removal      1/11 DR TSUEI  . Colon removal      8 " 12/2009 DR TSUEI  . Colon surgery      apr for rectal cancer 2011  . Cardiac catheterization    . Eye surgery      Cataract with lens- bil  . Colostomy    . Parastomal hernia repair  10/30/2012  . Ventral hernia repair  10/30/2012    Procedure:  LAPAROSCOPIC VENTRAL HERNIA;  Surgeon: Imogene Burn. Georgette Dover, MD;  Location: [facility];  Service: General;  Laterality: N/A;  Laparoscopic repair of parastomal hernia    Family History  Problem Relation Age of Onset  . Heart failure Father   . Cancer Mother     HEAD AND NECK  . Heart failure Sister   . Heart failure Brother   . Colon cancer Brother   . Colon cancer Brother   . Cervical cancer      Allergies  Allergen Reactions  . Percocet [Oxycodone-Acetaminophen] Other (See Comments)    Upsets stomach  . Influenza Virus Vacc Split Pf Other (See Comments)    Reaction unknown  . Sulfamethoxazole-Trimethoprim Other (See Comments)    Reaction unknown    Current Outpatient Prescriptions on File Prior to Visit  Medication Sig Dispense Refill  . diltiazem (TIAZAC) 180 MG 24 hr capsule TAKE 1 CAPSULE DAILY  90 capsule  1  . omeprazole (PRILOSEC) 40 MG capsule Take 40 mg by mouth daily.       No current facility-administered medications on file prior  to visit.    BP 140/80  Pulse 52  Temp(Src) 98.1 F (36.7 C) (Oral)  Resp 20  Ht 5\' 3"  (1.6 m)  Wt 133 lb (60.328 kg)  BMI 23.57 kg/m2  SpO2 97%     Review of Systems  Constitutional: Negative for fever, appetite change, fatigue and unexpected weight change.  HENT: Negative for congestion, dental problem, ear pain, hearing loss, mouth sores, nosebleeds, sinus pressure, sore throat, tinnitus, trouble swallowing and voice change.   Eyes: Negative for photophobia, pain, redness and visual disturbance.  Respiratory: Negative for cough, chest tightness and shortness of breath.   Cardiovascular: Negative for chest pain, palpitations and leg swelling.  Gastrointestinal: Positive for abdominal pain. Negative for nausea, vomiting, diarrhea, constipation, blood in stool, abdominal distention and rectal pain.  Genitourinary: Negative for dysuria, urgency, frequency, hematuria, flank pain, vaginal bleeding, vaginal discharge, difficulty  urinating, genital sores, vaginal pain, menstrual problem and pelvic pain.  Musculoskeletal: Positive for arthralgias and back pain. Negative for neck stiffness.  Skin: Negative for rash.  Neurological: Negative for dizziness, syncope, speech difficulty, weakness, light-headedness, numbness and headaches.  Hematological: Negative for adenopathy. Does not bruise/bleed easily.  Psychiatric/Behavioral: Negative for suicidal ideas, behavioral problems, self-injury, dysphoric mood and agitation. The patient is not nervous/anxious.        Objective:   Physical Exam  Constitutional: She is oriented to person, place, and time. She appears well-developed and well-nourished.  HENT:  Head: Normocephalic.  Right Ear: External ear normal.  Left Ear: External ear normal.  Mouth/Throat: Oropharynx is clear and moist.  Edentulous  Eyes: Conjunctivae and EOM are normal. Pupils are equal, round, and reactive to light.  Neck: Normal range of motion. Neck supple. No thyromegaly present.  Cardiovascular: Normal rate, regular rhythm and normal heart sounds.   Dorsalis pedis pulses full  Pulmonary/Chest: Effort normal and breath sounds normal.  Abdominal: Soft. Bowel sounds are normal. She exhibits no mass. There is no tenderness.  Left-sided colostomy  Musculoskeletal: Normal range of motion. She exhibits edema.  Tense musculature in the interscapular area on the left Left knee wrapped in Ace bandage  Bilateral lower extremity edema, left greater than right  Lymphadenopathy:    She has no cervical adenopathy.  Neurological: She is alert and oriented to person, place, and time.  Skin: Skin is warm and dry. No rash noted.  Psychiatric: She has a normal mood and affect. Her behavior is normal.          Assessment & Plan:    preventive health exam Hypertension stable History of rectal cancer.  We'll check lab including CEA Hypothyroidism.  Check TSH Dyslipidemia.  Will review a lipid  profile  Followup 6 months Oncology followup as scheduledHEENT he 8

## 2014-09-30 LAB — CEA: CEA: 2.3 ng/mL (ref 0.0–5.0)

## 2014-10-01 ENCOUNTER — Telehealth: Payer: Self-pay | Admitting: Oncology

## 2014-10-01 ENCOUNTER — Ambulatory Visit (HOSPITAL_BASED_OUTPATIENT_CLINIC_OR_DEPARTMENT_OTHER): Payer: Medicare Other | Admitting: Oncology

## 2014-10-01 ENCOUNTER — Other Ambulatory Visit (HOSPITAL_BASED_OUTPATIENT_CLINIC_OR_DEPARTMENT_OTHER): Payer: Medicare Other

## 2014-10-01 VITALS — BP 179/72 | HR 64 | Temp 98.6°F | Resp 18 | Ht 63.0 in | Wt 133.1 lb

## 2014-10-01 DIAGNOSIS — Z85048 Personal history of other malignant neoplasm of rectum, rectosigmoid junction, and anus: Secondary | ICD-10-CM

## 2014-10-01 DIAGNOSIS — C20 Malignant neoplasm of rectum: Secondary | ICD-10-CM

## 2014-10-01 DIAGNOSIS — N289 Disorder of kidney and ureter, unspecified: Secondary | ICD-10-CM | POA: Diagnosis not present

## 2014-10-01 DIAGNOSIS — R634 Abnormal weight loss: Secondary | ICD-10-CM

## 2014-10-01 DIAGNOSIS — M549 Dorsalgia, unspecified: Secondary | ICD-10-CM | POA: Diagnosis not present

## 2014-10-01 NOTE — Telephone Encounter (Signed)
gv and printed appt sched and avs for pt for OCT 2016 °

## 2014-10-01 NOTE — Progress Notes (Signed)
Hematology and Oncology Follow Up Visit  Sherry [name] [id number] 06/02/1923 80 y.o. 10/01/2014 1:29 PM  Bluford Kaufmann, MD  Owens Loffler, MD  Donnie Mesa, MD  Gery Pray, MD, PhD (Radiation Oncology)  Principle Diagnosis:  This is an 80 year old female diagnosed with a T3 N0 rectal adenocarcinoma by EUS criteria in September 2010.  She did have subsequently a T2 N1 stage III rectal cancer.   Prior Therapy:  1.  She received neoadjuvant therapy concomitantly with Xeloda, therapy concluded on October 31, 2010.  She received total 5040 cGy in 28 fractions. 2.  The patient received abdominoperineal resection done December 27, 2009.  She had a T2 N1 with 1 out of 9 lymph nodes involved. 3.  The patient received two cycles of adjuvant FOLFOX chemotherapy, last given was March 2011.  The patient discontinued chemotherapy due to her wishes and due to her intolerance with the small bowel obstruction that was recurrent.  Current therapy: Surveillance and watchful observation.  Interim History:  Sherry [name] presents today for an office follow-up visit with her daughter. Since her last visit, she started developing nagging persistent midback pain. Her pain detected in the mid thoracic area without any specific radiation. She rates it sometimes a 5/10 but sometimes more severe. She did not report any neurological deficits were difficulty ambulating. She still using a cane and did not report any falls. He also reported a few pound weight loss and decrease in her appetite. She does not report any nausea, vomiting, diarrhea, constipation.  She does not report any melena, hematochezia or hematuria, fevers, chills or night sweats.  She did not report any lymphadenopathy or petechiae. She does not report any leg edema orthopnea. Rest of her review of systems unremarkable.   Medications: I have reviewed the patient's current medications. Current Outpatient Prescriptions  Medication Sig Dispense Refill   . albuterol (PROVENTIL HFA;VENTOLIN HFA) 108 (90 BASE) MCG/ACT inhaler Inhale 2 puffs into the lungs every 4 (four) hours as needed. For shortness of breath  3 Inhaler  3  . atorvastatin (LIPITOR) 10 MG tablet Take 1 tablet (10 mg total) by mouth daily.  90 tablet  3  . diltiazem (TIAZAC) 180 MG 24 hr capsule TAKE 1 CAPSULE DAILY  90 capsule  1  . levothyroxine (SYNTHROID, LEVOTHROID) 75 MCG tablet TAKE 1 TABLET DAILY  90 tablet  3  . metoprolol succinate (TOPROL-XL) 25 MG 24 hr tablet TAKE ONE-HALF (12.5 MG TOTAL) TABLET DAILY  90 tablet  3  . oxybutynin (DITROPAN-XL) 10 MG 24 hr tablet Take 1 tablet (10 mg total) by mouth daily.  90 tablet  3  . traMADol-acetaminophen (ULTRACET) 37.5-325 MG per tablet Take 1 tablet by mouth every 6 (six) hours as needed for moderate pain.  60 tablet  5   No current facility-administered medications for this visit.    Allergies:  Allergies  Allergen Reactions  . Percocet [Oxycodone-Acetaminophen] Other (See Comments)    Upsets stomach  . Influenza Virus Vacc Split Pf Other (See Comments)    Reaction unknown  . Sulfamethoxazole-Trimethoprim Other (See Comments)    Reaction unknown    Past Medical History, Surgical history, Social history, and Family History were reviewed and updated.   Physical Exam: Blood pressure 179/72, pulse 64, temperature 98.6 F (37 C), temperature source Oral, resp. rate 18, height 5\' 3"  (1.6 m), weight 133 lb 1.6 oz (60.374 kg). ECOG: 1 General appearance: alert awake not in any distress. Head: Normocephalic, without obvious abnormality Neck: no  adenopathy Lymph nodes: Cervical, supraclavicular, and axillary nodes normal. Heart:regular rate and rhythm, S1, S2 normal, no murmur, click, rub or gallop Lung:chest clear, no wheezing, rales, normal symmetric air entry Abdomin: soft, non-tender, without masses or organomegaly. Colostomy site appeared normal. EXT:no erythema, induration, or nodules Neurological: No deficits  noted.  Lab Results: Lab Results  Component Value Date   WBC 5.1 09/29/2014   HGB 12.5 09/29/2014   HCT 38.2 09/29/2014   MCV 98.0 09/29/2014   PLT 236.0 09/29/2014     Chemistry      Component Value Date/Time   NA 136 09/29/2014 0952   NA 140 10/01/2013 1301   NA 140 08/04/2012 0949   K 3.6 09/29/2014 0952   K 4.0 10/01/2013 1301   K 4.1 08/04/2012 0949   CL 103 09/29/2014 0952   CL 106 04/01/2013 1506   CL 100 08/04/2012 0949   CO2 28 09/29/2014 0952   CO2 28 10/01/2013 1301   CO2 30 08/04/2012 0949   BUN 12 09/29/2014 0952   BUN 13.0 10/01/2013 1301   BUN 16 08/04/2012 0949   CREATININE 0.7 09/29/2014 0952   CREATININE 0.8 10/01/2013 1301   CREATININE 0.68 07/27/2013 1407      Component Value Date/Time   CALCIUM 10.3 09/29/2014 0952   CALCIUM 10.5* 10/01/2013 1301   CALCIUM 9.8 08/04/2012 0949   ALKPHOS 114 09/29/2014 0952   ALKPHOS 142 10/01/2013 1301   ALKPHOS 131* 08/04/2012 0949   AST 18 09/29/2014 0952   AST 12 10/01/2013 1301   AST 20 08/04/2012 0949   ALT 13 09/29/2014 0952   ALT 9 10/01/2013 1301   ALT 24 08/04/2012 0949   BILITOT 0.7 09/29/2014 0952   BILITOT 0.44 10/01/2013 1301   BILITOT 0.60 08/04/2012 0949       Impression and Plan: This is an 80 year old female with the following issues;  1.  Stage III colorectal cancer, she is status post surgical resection in January 2011.  She did not tolerate the course of adjuvant chemotherapy.  She is currently on observation and surveillance. She did have a CT scan on 07/30/2013 and did not show any evidence to suggest cancer recurrence. She is reporting constellation of symptoms of back pain, weight loss that are unclear to be related to cancer recurrence. She is close to 5 years out from her surgery but she is always a risk of cancer relapse. For that reason, I will obtain a whole-body PET scan to investigate cancer recurrence. She continues to be disease-free, she will followup in 12 months as scheduled. 2.   Back pain: Unclear etiology with differential diagnosis includes osteoarthritis and spinal stenosis but certainly we have to rule out malignancy. If cancer is ruled out, encourage her to follow up with her primary care physician. 3.  Surveillance colonoscopies: this was done 2014. 4.  Abdominal  hernia. She is S/P repair and doing well.  5.  left kidney lesion: Very small in size at this time and currently under surveillance by Dr. Luberta Robertson. 5.  Follow up: 12 months sooner as her PET scan show any cancer relapse.    SLHTDS,KAJGO, MD 10/16/20151:29 PM

## 2014-10-08 ENCOUNTER — Ambulatory Visit (HOSPITAL_COMMUNITY)
Admission: RE | Admit: 2014-10-08 | Discharge: 2014-10-08 | Disposition: A | Discharge status: Home / Self Care | Payer: Medicare Other | Source: Ambulatory Visit | Attending: Oncology | Admitting: Oncology

## 2014-10-08 DIAGNOSIS — Z90710 Acquired absence of both cervix and uterus: Secondary | ICD-10-CM | POA: Insufficient documentation

## 2014-10-08 DIAGNOSIS — Z9049 Acquired absence of other specified parts of digestive tract: Secondary | ICD-10-CM | POA: Insufficient documentation

## 2014-10-08 DIAGNOSIS — C20 Malignant neoplasm of rectum: Secondary | ICD-10-CM | POA: Diagnosis not present

## 2014-10-08 DIAGNOSIS — I2510 Atherosclerotic heart disease of native coronary artery without angina pectoris: Secondary | ICD-10-CM | POA: Diagnosis not present

## 2014-10-08 LAB — GLUCOSE, CAPILLARY: Glucose-Capillary: 91 mg/dL (ref 70–99)

## 2014-10-08 MED ORDER — TECHNETIUM TC 99M MEBROFENIN IV KIT
1124.0000 | PACK | Freq: Once | INTRAVENOUS | Status: AC | PRN
  Administered 2014-10-08: 1124 via INTRAVENOUS

## 2014-10-11 ENCOUNTER — Telehealth: Payer: Self-pay | Admitting: Medical Oncology

## 2014-10-11 NOTE — Telephone Encounter (Signed)
Per MD, PET scan results given to patient. Patient expressed thanks, no further questions at this time.

## 2014-10-11 NOTE — Telephone Encounter (Signed)
Message copied by Maxwell Marion on Mon Oct 11, 2014 10:48 AM ------      Message from: Wyatt Portela      Created: Mon Oct 11, 2014 10:04 AM       Please call patient and let her know that the scan is clear. No cancer seen. ------

## 2014-10-15 ENCOUNTER — Telehealth: Payer: Self-pay | Admitting: Internal Medicine

## 2014-10-15 MED ORDER — PREDNISONE 10 MG PO TABS: 10.0000 mg | ORAL_TABLET | Freq: Two times a day (BID) | ORAL | Status: DC

## 2014-10-15 NOTE — Telephone Encounter (Signed)
Prednisone 10 mg #14 one twice a day

## 2014-10-15 NOTE — Telephone Encounter (Signed)
Pt states she has the same issues as Sherry [name] her son in [employer]. She stays w/ them. Pt has cough, aches, (and emphysema.) Was hoping to get the same med. [facility]/ [place]

## 2014-10-15 NOTE — Telephone Encounter (Signed)
Please see message and advise 

## 2014-10-15 NOTE — Telephone Encounter (Signed)
Pt notified Rx sent to pharmacy for Prednisone.

## 2014-10-26 ENCOUNTER — Ambulatory Visit (INDEPENDENT_AMBULATORY_CARE_PROVIDER_SITE_OTHER): Payer: Medicare Other | Admitting: Internal Medicine

## 2014-10-26 ENCOUNTER — Encounter: Payer: Self-pay | Admitting: *Deleted

## 2014-10-26 ENCOUNTER — Encounter: Payer: Self-pay | Admitting: Internal Medicine

## 2014-10-26 VITALS — BP 130/70 | HR 70 | Temp 98.1°F | Resp 18 | Ht 63.0 in | Wt 133.0 lb

## 2014-10-26 DIAGNOSIS — I10 Essential (primary) hypertension: Secondary | ICD-10-CM

## 2014-10-26 DIAGNOSIS — J069 Acute upper respiratory infection, unspecified: Secondary | ICD-10-CM | POA: Diagnosis not present

## 2014-10-26 DIAGNOSIS — I951 Orthostatic hypotension: Secondary | ICD-10-CM

## 2014-10-26 MED ORDER — MECLIZINE HCL 25 MG PO TABS: 25.0000 mg | ORAL_TABLET | Freq: Three times a day (TID) | ORAL | Status: DC | PRN

## 2014-10-26 NOTE — Progress Notes (Signed)
Pre visit review using our clinic review tool, if applicable. No additional management support is needed unless otherwise documented below in the visit note. 

## 2014-10-26 NOTE — Progress Notes (Signed)
Subjective:    Patient ID: Sherry [name], female    DOB: [birth year], 80 y.o.   MRN: [id number]  HPI 80 year old patient who presents today with a chief complaint of dizziness.  This seems to be orthostatic and is worse when she stands from a sitting position or first gets out of bed in the morning.  She has documented some orthostatic hypotension at home. Otherwise doing reasonably well.  EKG last month revealed some significant sinus bradycardia.  Medical regimen includes metoprolol 12 point 5 mg daily as well as diltiazem 180 mg daily  Past Medical History  Diagnosis Date  . CAD (coronary artery disease)   . Hyperlipidemia   . Hypertension   . Anemia   . COPD (chronic obstructive pulmonary disease)   . Headache(784.0)   . Hypothyroidism   . Osteoporosis   . Shingles   . Arthritis   . Tendon adhesions     torn tendon right shoulder  . Rectal adenocarcinoma dx'd 08/2009  . Rectal cancer   . Shortness of breath   . GERD (gastroesophageal reflux disease)   . History of blood transfusion     History   Social History  . Marital Status: Married    Spouse Name: N/A    Number of Children: 3  . Years of Education: N/A   Occupational History  . Not on file.   Social History Main Topics  . Smoking status: Former Smoker -- 15 years    Quit date: 12/18/2003  . Smokeless tobacco: Never Used     Comment: quit 2005  . Alcohol Use: No  . Drug Use: No  . Sexual Activity: No   Other Topics Concern  . Not on file   Social History Narrative    Past Surgical History  Procedure Laterality Date  . Appendectomy    . Cholecystectomy    . Vaginal hysterectomy    . Rectum removal      1/11 DR TSUEI  . Colon removal      8 " 12/2009 DR TSUEI  . Colon surgery      apr for rectal cancer 2011  . Cardiac catheterization    . Eye surgery      Cataract with lens- bil  . Colostomy    . Parastomal hernia repair  10/30/2012  . Ventral hernia repair  10/30/2012    Procedure:  LAPAROSCOPIC VENTRAL HERNIA;  Surgeon: Imogene Burn. Georgette Dover, MD;  Location: [facility];  Service: General;  Laterality: N/A;  Laparoscopic repair of parastomal hernia    Family History  Problem Relation Age of Onset  . Heart failure Father   . Cancer Mother     HEAD AND NECK  . Heart failure Sister   . Heart failure Brother   . Colon cancer Brother   . Colon cancer Brother   . Cervical cancer      Allergies  Allergen Reactions  . Percocet [Oxycodone-Acetaminophen] Other (See Comments)    Upsets stomach  . Influenza Virus Vacc Split Pf Other (See Comments)    Reaction unknown  . Sulfamethoxazole-Trimethoprim Other (See Comments)    Reaction unknown    Current Outpatient Prescriptions on File Prior to Visit  Medication Sig Dispense Refill  . albuterol (PROVENTIL HFA;VENTOLIN HFA) 108 (90 BASE) MCG/ACT inhaler Inhale 2 puffs into the lungs every 4 (four) hours as needed. For shortness of breath 3 Inhaler 3  . atorvastatin (LIPITOR) 10 MG tablet Take 1 tablet (10 mg total) by mouth daily.  90 tablet 3  . diltiazem (TIAZAC) 180 MG 24 hr capsule TAKE 1 CAPSULE DAILY 90 capsule 1  . levothyroxine (SYNTHROID, LEVOTHROID) 75 MCG tablet TAKE 1 TABLET DAILY 90 tablet 3  . metoprolol succinate (TOPROL-XL) 25 MG 24 hr tablet TAKE ONE-HALF (12.5 MG TOTAL) TABLET DAILY 90 tablet 3  . oxybutynin (DITROPAN-XL) 10 MG 24 hr tablet Take 1 tablet (10 mg total) by mouth daily. 90 tablet 3  . traMADol-acetaminophen (ULTRACET) 37.5-325 MG per tablet Take 1 tablet by mouth every 6 (six) hours as needed for moderate pain. 60 tablet 5   No current facility-administered medications on file prior to visit.    BP 130/70 mmHg  Pulse 70  Temp(Src) 98.1 F (36.7 C) (Oral)  Resp 18  Ht 5\' 3"  (1.6 m)  Wt 133 lb (60.328 kg)  BMI 23.57 kg/m2  SpO2 95%      Review of Systems  Constitutional: Negative.   HENT: Negative for congestion, dental problem, hearing loss, rhinorrhea, sinus pressure, sore throat and  tinnitus.   Eyes: Negative for pain, discharge and visual disturbance.  Respiratory: Negative for cough and shortness of breath.   Cardiovascular: Negative for chest pain, palpitations and leg swelling.  Gastrointestinal: Negative for nausea, vomiting, abdominal pain, diarrhea, constipation, blood in stool and abdominal distention.  Genitourinary: Negative for dysuria, urgency, frequency, hematuria, flank pain, vaginal bleeding, vaginal discharge, difficulty urinating, vaginal pain and pelvic pain.  Musculoskeletal: Negative for joint swelling, arthralgias and gait problem.  Skin: Negative for rash.  Neurological: Positive for dizziness and light-headedness. Negative for syncope, speech difficulty, weakness, numbness and headaches.  Hematological: Negative for adenopathy.  Psychiatric/Behavioral: Negative for behavioral problems, dysphoric mood and agitation. The patient is not nervous/anxious.        Objective:   Physical Exam  Constitutional: She is oriented to person, place, and time. She appears well-developed and well-nourished.  Blood pressure 140/80 sitting Blood pressure 110/70 standing  HENT:  Head: Normocephalic.  Right Ear: External ear normal.  Left Ear: External ear normal.  Mouth/Throat: Oropharynx is clear and moist.  Eyes: Conjunctivae and EOM are normal. Pupils are equal, round, and reactive to light.  Neck: Normal range of motion. Neck supple. No thyromegaly present.  Cardiovascular: Normal rate, regular rhythm, normal heart sounds and intact distal pulses.   Pulmonary/Chest: Effort normal. She has rales.  Abdominal: Soft. Bowel sounds are normal. She exhibits no mass. There is no tenderness.  Musculoskeletal: Normal range of motion. She exhibits edema.  Lymphadenopathy:    She has no cervical adenopathy.  Neurological: She is alert and oriented to person, place, and time.  Skin: Skin is warm and dry. No rash noted.  Subcutaneous soft.  freely movable nodule right  mid forearm consistent with a lipoma  Psychiatric: She has a normal mood and affect. Her behavior is normal.          Assessment & Plan:   Orthostatic hypotension.  Will hold diltiazem and recheck patient in blood pressure in 4 weeks Low-salt diet recommended Hypertension

## 2014-10-26 NOTE — Patient Instructions (Signed)
Limit your sodium (Salt) intake  Return in one month for follow-up 

## 2014-11-29 ENCOUNTER — Telehealth: Payer: Self-pay | Admitting: Internal Medicine

## 2014-11-29 NOTE — Telephone Encounter (Signed)
Pt called to ask if you will give her a call back . She said she is having some issues with bp

## 2014-11-29 NOTE — Telephone Encounter (Signed)
Spoke to pt, c/o blood pressure being elevated past few days and dizziness. Blood pressure ranging from 132/90 to 180/92. Pt concerned the systolic is high since change in medication dosage. Asked pt if she can come in tomorrow to see another provider due to Dr. Raliegh Ip is out of the office till Dec 21st. Pt said yes. Appointment scheduled for Dec 15th at 2:15 PM with Dr. Maudie Mercury. Pt verbalized understanding.

## 2014-11-30 ENCOUNTER — Encounter: Payer: Self-pay | Admitting: Family Medicine

## 2014-11-30 ENCOUNTER — Ambulatory Visit (INDEPENDENT_AMBULATORY_CARE_PROVIDER_SITE_OTHER): Payer: Medicare Other | Admitting: Family Medicine

## 2014-11-30 VITALS — BP 126/88 | HR 63 | Temp 98.3°F | Ht 63.0 in | Wt 133.1 lb

## 2014-11-30 DIAGNOSIS — I10 Essential (primary) hypertension: Secondary | ICD-10-CM

## 2014-11-30 NOTE — Progress Notes (Signed)
Pre visit review using our clinic review tool, if applicable. No additional management support is needed unless otherwise documented below in the visit note. 

## 2014-11-30 NOTE — Progress Notes (Signed)
HPI:  Acute visit for:  "Elevated Blood Bressure" -reports: checks BP at home Denies: falls, CP, SOB, DOE, HA -hx of HTN on metoprolol  xl 12.5 in the evening - reports was on diltiazem in the past - on ROC had bradycardia, orthostasis and dizziness and diltiazem stopped   ROS: See pertinent positives and negatives per HPI.  Past Medical History  Diagnosis Date  . CAD (coronary artery disease)   . Hyperlipidemia   . Hypertension   . Anemia   . COPD (chronic obstructive pulmonary disease)   . Headache(784.0)   . Hypothyroidism   . Osteoporosis   . Shingles   . Arthritis   . Tendon adhesions     torn tendon right shoulder  . Rectal adenocarcinoma dx'd 08/2009  . Rectal cancer   . Shortness of breath   . GERD (gastroesophageal reflux disease)   . History of blood transfusion     Past Surgical History  Procedure Laterality Date  . Appendectomy    . Cholecystectomy    . Vaginal hysterectomy    . Rectum removal      1/11 DR TSUEI  . Colon removal      8 " 12/2009 DR TSUEI  . Colon surgery      apr for rectal cancer 2011  . Cardiac catheterization    . Eye surgery      Cataract with lens- bil  . Colostomy    . Parastomal hernia repair  10/30/2012  . Ventral hernia repair  10/30/2012    Procedure: LAPAROSCOPIC VENTRAL HERNIA;  Surgeon: Imogene Burn. Georgette Dover, MD;  Location: [place] [facility];  Service: General;  Laterality: N/A;  Laparoscopic repair of parastomal hernia    Family History  Problem Relation Age of Onset  . Heart failure Father   . Cancer Mother     HEAD AND NECK  . Heart failure Sister   . Heart failure Brother   . Colon cancer Brother   . Colon cancer Brother   . Cervical cancer      History   Social History  . Marital Status: Married    Spouse Name: N/A    Number of Children: 3  . Years of Education: N/A   Social History Main Topics  . Smoking status: Former Smoker -- 84 years    Quit date: 12/18/2003  . Smokeless tobacco: Never Used     Comment:  quit 2005  . Alcohol Use: No  . Drug Use: No  . Sexual Activity: No   Other Topics Concern  . None   Social History Narrative    Current outpatient prescriptions: albuterol (PROVENTIL HFA;VENTOLIN HFA) 108 (90 BASE) MCG/ACT inhaler, Inhale 2 puffs into the lungs every 4 (four) hours as needed. For shortness of breath, Disp: 3 Inhaler, Rfl: 3;  atorvastatin (LIPITOR) 10 MG tablet, Take 1 tablet (10 mg total) by mouth daily., Disp: 90 tablet, Rfl: 3;  levothyroxine (SYNTHROID, LEVOTHROID) 75 MCG tablet, TAKE 1 TABLET DAILY, Disp: 90 tablet, Rfl: 3 meclizine (ANTIVERT) 25 MG tablet, Take 1 tablet (25 mg total) by mouth 3 (three) times daily as needed for dizziness., Disp: 30 tablet, Rfl: 1;  metoprolol succinate (TOPROL-XL) 25 MG 24 hr tablet, TAKE ONE-HALF (12.5 MG TOTAL) TABLET DAILY, Disp: 90 tablet, Rfl: 3;  oxybutynin (DITROPAN-XL) 10 MG 24 hr tablet, Take 1 tablet (10 mg total) by mouth daily., Disp: 90 tablet, Rfl: 3 traMADol-acetaminophen (ULTRACET) 37.5-325 MG per tablet, Take 1 tablet by mouth every 6 (six) hours as  needed for moderate pain., Disp: 60 tablet, Rfl: 5  EXAM:  Filed Vitals:   11/30/14 1404  BP: 126/88  Pulse: 63  Temp: 98.3 F (36.8 C)    Body mass index is 23.58 kg/(m^2).  GENERAL: vitals reviewed and listed above, alert, oriented, appears well hydrated and in no acute distress  HEENT: atraumatic, conjunttiva clear, no obvious abnormalities on inspection of external nose and ears  NECK: no obvious masses on inspection  LUNGS: clear to auscultation bilaterally, no wheezes, rales or rhonchi, good air movement  CV: HRRR, no peripheral edema  MS: moves all extremities without noticeable abnormality  PSYCH: pleasant and cooperative, no obvious depression or anxiety  ASSESSMENT AND PLAN:  Discussed the following assessment and plan:  Essential hypertension  -low sodium diet -BP is great here -advised if monitoring at home ensure sitting for 5 minutes  with feet flat on the floor -follow up with PCP as scheduled -Patient advised to return or notify a doctor immediately if symptoms worsen or persist or new concerns arise.  There are no Patient Instructions on file for this visit.   Colin Benton R.

## 2014-12-07 ENCOUNTER — Ambulatory Visit (INDEPENDENT_AMBULATORY_CARE_PROVIDER_SITE_OTHER): Payer: Medicare Other | Admitting: Internal Medicine

## 2014-12-07 ENCOUNTER — Encounter: Payer: Self-pay | Admitting: Internal Medicine

## 2014-12-07 VITALS — BP 140/90 | HR 64 | Temp 97.6°F | Resp 18 | Ht 63.0 in | Wt 131.0 lb

## 2014-12-07 DIAGNOSIS — E785 Hyperlipidemia, unspecified: Secondary | ICD-10-CM

## 2014-12-07 DIAGNOSIS — I10 Essential (primary) hypertension: Secondary | ICD-10-CM

## 2014-12-07 NOTE — Progress Notes (Signed)
Subjective:    Patient ID: Sherry [name], female    DOB: 1923/11/15, 80 y.o.   MRN: [id number]  HPI  80 year old patient who is seen today for follow-up.  She has a history of hypertension.  She has a URI that is slowly improving.  In general doing quite well.  History of rectal adenocarcinoma.  She has treated hypothyroidism.  Past Medical History  Diagnosis Date  . CAD (coronary artery disease)   . Hyperlipidemia   . Hypertension   . Anemia   . COPD (chronic obstructive pulmonary disease)   . Headache(784.0)   . Hypothyroidism   . Osteoporosis   . Shingles   . Arthritis   . Tendon adhesions     torn tendon right shoulder  . Rectal adenocarcinoma dx'd 08/2009  . Rectal cancer   . Shortness of breath   . GERD (gastroesophageal reflux disease)   . History of blood transfusion     History   Social History  . Marital Status: Married    Spouse Name: N/A    Number of Children: 3  . Years of Education: N/A   Occupational History  . Not on file.   Social History Main Topics  . Smoking status: Former Smoker -- 52 years    Quit date: 12/18/2003  . Smokeless tobacco: Never Used     Comment: quit 2005  . Alcohol Use: No  . Drug Use: No  . Sexual Activity: No   Other Topics Concern  . Not on file   Social History Narrative    Past Surgical History  Procedure Laterality Date  . Appendectomy    . Cholecystectomy    . Vaginal hysterectomy    . Rectum removal      1/11 DR TSUEI  . Colon removal      8 " 12/2009 DR TSUEI  . Colon surgery      apr for rectal cancer 2011  . Cardiac catheterization    . Eye surgery      Cataract with lens- bil  . Colostomy    . Parastomal hernia repair  10/30/2012  . Ventral hernia repair  10/30/2012    Procedure: LAPAROSCOPIC VENTRAL HERNIA;  Surgeon: Imogene Burn. Georgette Dover, MD;  Location: [place] [facility];  Service: General;  Laterality: N/A;  Laparoscopic repair of parastomal hernia    Family History  Problem Relation Age of Onset  .  Heart failure Father   . Cancer Mother     HEAD AND NECK  . Heart failure Sister   . Heart failure Brother   . Colon cancer Brother   . Colon cancer Brother   . Cervical cancer      Allergies  Allergen Reactions  . Percocet [Oxycodone-Acetaminophen] Other (See Comments)    Upsets stomach  . Influenza Virus Vacc Split Pf Other (See Comments)    Reaction unknown  . Sulfamethoxazole-Trimethoprim Other (See Comments)    Reaction unknown    Current Outpatient Prescriptions on File Prior to Visit  Medication Sig Dispense Refill  . albuterol (PROVENTIL HFA;VENTOLIN HFA) 108 (90 BASE) MCG/ACT inhaler Inhale 2 puffs into the lungs every 4 (four) hours as needed. For shortness of breath 3 Inhaler 3  . atorvastatin (LIPITOR) 10 MG tablet Take 1 tablet (10 mg total) by mouth daily. 90 tablet 3  . levothyroxine (SYNTHROID, LEVOTHROID) 75 MCG tablet TAKE 1 TABLET DAILY 90 tablet 3  . meclizine (ANTIVERT) 25 MG tablet Take 1 tablet (25 mg total) by mouth 3 (  three) times daily as needed for dizziness. 30 tablet 1  . metoprolol succinate (TOPROL-XL) 25 MG 24 hr tablet TAKE ONE-HALF (12.5 MG TOTAL) TABLET DAILY 90 tablet 3  . oxybutynin (DITROPAN-XL) 10 MG 24 hr tablet Take 1 tablet (10 mg total) by mouth daily. 90 tablet 3  . traMADol-acetaminophen (ULTRACET) 37.5-325 MG per tablet Take 1 tablet by mouth every 6 (six) hours as needed for moderate pain. 60 tablet 5   No current facility-administered medications on file prior to visit.    BP 140/90 mmHg  Pulse 64  Temp(Src) 97.6 F (36.4 C) (Oral)  Resp 18  Ht 5\' 3"  (1.6 m)  Wt 131 lb (59.421 kg)  BMI 23.21 kg/m2  SpO2 96%     Review of Systems  Constitutional: Negative.   HENT: Negative for congestion, dental problem, hearing loss, rhinorrhea, sinus pressure, sore throat and tinnitus.   Eyes: Negative for pain, discharge and visual disturbance.  Respiratory: Positive for cough. Negative for shortness of breath.   Cardiovascular:  Negative for chest pain, palpitations and leg swelling.  Gastrointestinal: Negative for nausea, vomiting, abdominal pain, diarrhea, constipation, blood in stool and abdominal distention.  Genitourinary: Negative for dysuria, urgency, frequency, hematuria, flank pain, vaginal bleeding, vaginal discharge, difficulty urinating, vaginal pain and pelvic pain.  Musculoskeletal: Negative for joint swelling, arthralgias and gait problem.  Skin: Negative for rash.  Neurological: Negative for dizziness, syncope, speech difficulty, weakness, numbness and headaches.  Hematological: Negative for adenopathy.  Psychiatric/Behavioral: Negative for behavioral problems, dysphoric mood and agitation. The patient is not nervous/anxious.        Objective:   Physical Exam  Constitutional: She is oriented to person, place, and time. She appears well-developed and well-nourished.  HENT:  Head: Normocephalic.  Right Ear: External ear normal.  Left Ear: External ear normal.  Mouth/Throat: Oropharynx is clear and moist.  Eyes: Conjunctivae and EOM are normal. Pupils are equal, round, and reactive to light.  Neck: Normal range of motion. Neck supple. No thyromegaly present.  Cardiovascular: Normal rate, regular rhythm, normal heart sounds and intact distal pulses.   Pulmonary/Chest: Effort normal and breath sounds normal.  Abdominal: Soft. Bowel sounds are normal. She exhibits no mass. There is no tenderness.  Musculoskeletal: Normal range of motion.  Lymphadenopathy:    She has no cervical adenopathy.  Neurological: She is alert and oriented to person, place, and time.  Skin: Skin is warm and dry. No rash noted.  Psychiatric: She has a normal mood and affect. Her behavior is normal.          Assessment & Plan:   Hypertension, stable.  Continue present regimen.  Remains well controlled off medication Dyslipidemia.  Continue atorvastatin COPD stable.  Resolving URI.  Continue when necessary albuterol.

## 2014-12-07 NOTE — Progress Notes (Signed)
Pre visit review using our clinic review tool, if applicable. No additional management support is needed unless otherwise documented below in the visit note. 

## 2014-12-07 NOTE — Patient Instructions (Signed)
Limit your sodium (Salt) intake  Return in 4 months for follow-up  

## 2014-12-15 ENCOUNTER — Telehealth: Payer: Self-pay | Admitting: Internal Medicine

## 2014-12-15 NOTE — Telephone Encounter (Signed)
Called and spoke with pt and pt is aware.  Pt verbalized understanding.

## 2014-12-15 NOTE — Telephone Encounter (Signed)
Acute sinusitis symptoms for less than 10 days are generally not helped by antibiotic therapy.  Use saline irrigation, warm  moist compresses and over-the-counter decongestants only as directed.  Call if there is no improvement in 5 to 7 days, or sooner if you develop increasing pain, fever, or any new symptoms.  Take [phone] mg of Tylenol every 6 hours as needed for pain relief or fever.  Avoid taking more than 3000 mg in a 24-hour period (  This may cause liver damage).

## 2014-12-15 NOTE — Telephone Encounter (Signed)
Pt states she has congestion, runny nose, body aches, head aches, daughter had bronchitis/pna and pt stays w/ her. Not sure if she caught that.  also had diarrhea last couple of days. Pt just in last week.  Pt would like to know if you will call in something. [facility]/[place]

## 2014-12-15 NOTE — Telephone Encounter (Signed)
pls advise

## 2015-02-08 DIAGNOSIS — M170 Bilateral primary osteoarthritis of knee: Secondary | ICD-10-CM | POA: Diagnosis not present

## 2015-02-08 DIAGNOSIS — S2232XA Fracture of one rib, left side, initial encounter for closed fracture: Secondary | ICD-10-CM | POA: Diagnosis not present

## 2015-02-08 DIAGNOSIS — S8002XA Contusion of left knee, initial encounter: Secondary | ICD-10-CM | POA: Diagnosis not present

## 2015-02-08 DIAGNOSIS — S8001XA Contusion of right knee, initial encounter: Secondary | ICD-10-CM | POA: Diagnosis not present

## 2015-02-08 DIAGNOSIS — M419 Scoliosis, unspecified: Secondary | ICD-10-CM | POA: Diagnosis not present

## 2015-03-03 ENCOUNTER — Telehealth: Payer: Self-pay | Admitting: *Deleted

## 2015-03-03 MED ORDER — TRAMADOL-ACETAMINOPHEN 37.5-325 MG PO TABS: 1.0000 | ORAL_TABLET | Freq: Four times a day (QID) | ORAL | Status: DC | PRN

## 2015-03-03 NOTE — Telephone Encounter (Signed)
Rx for Ultracet faxed to [facility].

## 2015-03-03 NOTE — Telephone Encounter (Signed)
Refill tramadol 37.5/325 mg every 6 hours prn for pain [facility]

## 2015-03-04 ENCOUNTER — Encounter: Payer: Self-pay | Admitting: Internal Medicine

## 2015-03-04 ENCOUNTER — Ambulatory Visit (INDEPENDENT_AMBULATORY_CARE_PROVIDER_SITE_OTHER): Payer: Medicare Other | Admitting: Internal Medicine

## 2015-03-04 VITALS — BP 136/90 | HR 59 | Temp 98.2°F | Resp 18 | Ht 63.0 in | Wt 131.0 lb

## 2015-03-04 DIAGNOSIS — C20 Malignant neoplasm of rectum: Secondary | ICD-10-CM

## 2015-03-04 DIAGNOSIS — E039 Hypothyroidism, unspecified: Secondary | ICD-10-CM | POA: Diagnosis not present

## 2015-03-04 DIAGNOSIS — R2681 Unsteadiness on feet: Secondary | ICD-10-CM | POA: Diagnosis not present

## 2015-03-04 DIAGNOSIS — E785 Hyperlipidemia, unspecified: Secondary | ICD-10-CM

## 2015-03-04 NOTE — Progress Notes (Signed)
Subjective:    Patient ID: Sherry [name], female    DOB: May 11, 1923, 81 y.o.   MRN: [id number]  HPI 81 year old patient who is seen today for her six-month follow-up.  He has a history of rectal adenocarcinoma and this has been quite stable.  She has a history also of an unsteady gait and she did fall 3 weeks ago sustaining left chest wall trauma.  She has been evaluated by orthopedics and is recovering from some left rib fractures.  She uses a cane and walker.  She is accompanied by her daughter. She has history of COPD, which has been quite stable. She has hypothyroidism and is concerned about adequate treatment.  Since she has some chronic fatigue. She has dyslipidemia and remains on Lipitor.  Past Medical History  Diagnosis Date  . CAD (coronary artery disease)   . Hyperlipidemia   . Hypertension   . Anemia   . COPD (chronic obstructive pulmonary disease)   . Headache(784.0)   . Hypothyroidism   . Osteoporosis   . Shingles   . Arthritis   . Tendon adhesions     torn tendon right shoulder  . Rectal adenocarcinoma dx'd 08/2009  . Rectal cancer   . Shortness of breath   . GERD (gastroesophageal reflux disease)   . History of blood transfusion     History   Social History  . Marital Status: Married    Spouse Name: N/A  . Number of Children: 3  . Years of Education: N/A   Occupational History  . Not on file.   Social History Main Topics  . Smoking status: Former Smoker -- 33 years    Quit date: 12/18/2003  . Smokeless tobacco: Never Used     Comment: quit 2005  . Alcohol Use: No  . Drug Use: No  . Sexual Activity: No   Other Topics Concern  . Not on file   Social History Narrative    Past Surgical History  Procedure Laterality Date  . Appendectomy    . Cholecystectomy    . Vaginal hysterectomy    . Rectum removal      1/11 DR TSUEI  . Colon removal      8 " 12/2009 DR TSUEI  . Colon surgery      apr for rectal cancer 2011  . Cardiac catheterization     . Eye surgery      Cataract with lens- bil  . Colostomy    . Parastomal hernia repair  10/30/2012  . Ventral hernia repair  10/30/2012    Procedure: LAPAROSCOPIC VENTRAL HERNIA;  Surgeon: Imogene Burn. Georgette Dover, MD;  Location: [place] [facility];  Service: General;  Laterality: N/A;  Laparoscopic repair of parastomal hernia    Family History  Problem Relation Age of Onset  . Heart failure Father   . Cancer Mother     HEAD AND NECK  . Heart failure Sister   . Heart failure Brother   . Colon cancer Brother   . Colon cancer Brother   . Cervical cancer      Allergies  Allergen Reactions  . Percocet [Oxycodone-Acetaminophen] Other (See Comments)    Upsets stomach  . Influenza Virus Vacc Split Pf Other (See Comments)    Reaction unknown  . Sulfamethoxazole-Trimethoprim Other (See Comments)    Reaction unknown    Current Outpatient Prescriptions on File Prior to Visit  Medication Sig Dispense Refill  . albuterol (PROVENTIL HFA;VENTOLIN HFA) 108 (90 BASE) MCG/ACT inhaler Inhale 2 puffs  into the lungs every 4 (four) hours as needed. For shortness of breath 3 Inhaler 3  . atorvastatin (LIPITOR) 10 MG tablet Take 1 tablet (10 mg total) by mouth daily. 90 tablet 3  . levothyroxine (SYNTHROID, LEVOTHROID) 75 MCG tablet TAKE 1 TABLET DAILY 90 tablet 3  . meclizine (ANTIVERT) 25 MG tablet Take 1 tablet (25 mg total) by mouth 3 (three) times daily as needed for dizziness. 30 tablet 1  . metoprolol succinate (TOPROL-XL) 25 MG 24 hr tablet TAKE ONE-HALF (12.5 MG TOTAL) TABLET DAILY 90 tablet 3  . oxybutynin (DITROPAN-XL) 10 MG 24 hr tablet Take 1 tablet (10 mg total) by mouth daily. 90 tablet 3  . traMADol-acetaminophen (ULTRACET) 37.5-325 MG per tablet Take 1 tablet by mouth every 6 (six) hours as needed for moderate pain. 60 tablet 5   No current facility-administered medications on file prior to visit.    BP 136/90 mmHg      Review of Systems  Constitutional: Positive for fatigue.  HENT:  Negative for congestion, dental problem, hearing loss, rhinorrhea, sinus pressure, sore throat and tinnitus.   Eyes: Negative for pain, discharge and visual disturbance.  Respiratory: Negative for cough and shortness of breath.   Cardiovascular: Positive for chest pain. Negative for palpitations and leg swelling.  Gastrointestinal: Negative for nausea, vomiting, abdominal pain, diarrhea, constipation, blood in stool and abdominal distention.  Genitourinary: Negative for dysuria, urgency, frequency, hematuria, flank pain, vaginal bleeding, vaginal discharge, difficulty urinating, vaginal pain and pelvic pain.  Musculoskeletal: Negative for joint swelling, arthralgias and gait problem.  Skin: Negative for rash.  Neurological: Negative for dizziness, syncope, speech difficulty, weakness, numbness and headaches.  Hematological: Negative for adenopathy.  Psychiatric/Behavioral: Negative for behavioral problems, dysphoric mood and agitation. The patient is not nervous/anxious.        Objective:   Physical Exam  Constitutional: She is oriented to person, place, and time. She appears well-developed and well-nourished.  Alert.  No distress.  Blood pressure repeat 1:30 over 82  HENT:  Head: Normocephalic.  Right Ear: External ear normal.  Left Ear: External ear normal.  Mouth/Throat: Oropharynx is clear and moist.  Eyes: Conjunctivae and EOM are normal. Pupils are equal, round, and reactive to light.  Neck: Normal range of motion. Neck supple. No thyromegaly present.  Cardiovascular: Normal rate, regular rhythm, normal heart sounds and intact distal pulses.   Pulmonary/Chest: Effort normal and breath sounds normal. She exhibits tenderness.  Tenderness left lateral chest wall  Abdominal: Soft. Bowel sounds are normal. She exhibits no mass. There is no tenderness.  Colostomy  Musculoskeletal: Normal range of motion.  Lymphadenopathy:    She has no cervical adenopathy.  Neurological: She is alert  and oriented to person, place, and time.  Skin: Skin is warm and dry. No rash noted.  Psychiatric: She has a normal mood and affect. Her behavior is normal.          Assessment & Plan:   History of rectal cancer Hypothyroidism.  Patient has some fatigue and is concerned about her thyroid status.  We'll recheck a TSH Essential hypertension.  Will continue to observe on her present regimen.  Blood pressure high normal today but has had some orthostatic hypotension in the past Unsteady gait History of recent left rib fracture  CPX 6 months

## 2015-03-04 NOTE — Patient Instructions (Signed)
Limit your sodium (Salt) intake  Please check your blood pressure on a regular basis.  If it is consistently greater than 150/90, please make an office appointment.  Return in 6 months for follow-up  Take a calcium supplement, plus 800-1200 units of vitamin D 

## 2015-03-20 ENCOUNTER — Encounter: Payer: Self-pay | Admitting: Gastroenterology

## 2015-03-25 ENCOUNTER — Encounter: Payer: Self-pay | Admitting: Gastroenterology

## 2015-03-31 ENCOUNTER — Ambulatory Visit: Payer: Medicare Other | Admitting: Internal Medicine

## 2015-04-08 ENCOUNTER — Ambulatory Visit: Payer: Medicare Other | Admitting: Internal Medicine

## 2015-04-11 ENCOUNTER — Ambulatory Visit: Payer: Medicare Other | Admitting: Internal Medicine

## 2015-04-18 ENCOUNTER — Ambulatory Visit (AMBULATORY_SURGERY_CENTER): Payer: Self-pay | Admitting: *Deleted

## 2015-04-18 ENCOUNTER — Telehealth: Payer: Self-pay | Admitting: *Deleted

## 2015-04-18 VITALS — Ht 63.0 in | Wt 132.2 lb

## 2015-04-18 DIAGNOSIS — Z85048 Personal history of other malignant neoplasm of rectum, rectosigmoid junction, and anus: Secondary | ICD-10-CM

## 2015-04-18 DIAGNOSIS — Z86010 Personal history of colonic polyps: Secondary | ICD-10-CM

## 2015-04-18 MED ORDER — MOVIPREP 100 G PO SOLR: 1.0000 | Freq: Once | ORAL | Status: DC

## 2015-04-18 NOTE — Telephone Encounter (Signed)
245pm LMTRC. Cancelled colon as per Dr Ardis Hughs. Will set up OV with pt at return call. marie PV

## 2015-04-18 NOTE — Telephone Encounter (Signed)
Spoke with Pt's daughter and we cancelled the colon and scheduled ROV 7-5 at 215 pm. Instructed her to check in at 2:00 pm and if she needs to cancel just call office.  Pt verbalized understanding of these instructions.marie PV

## 2015-04-18 NOTE — Progress Notes (Signed)
No egg or soy allergy No home 02 use No diet pills No issues with past sedation Pt has colostomy and she states her stoma bleeds all the time.  She has had it cauterized and it still bleeds She has a spot on her left kidney that they are watching and being followed by Dr Blair Dolphin at [facility] urology Pt states she has ? Rectal prolapse. Where she had her rectum sewn together its separating like a hernia. During the day she can feel this area dropping down. She states it feels like a ball she is sitting on bt it retracts at night while she is sleeping . She states it bothers her to walk and it feels like she is going to have a BM. Surgeon told her to repair this it was an all day surgery and with the risks they did not want to do this .

## 2015-04-18 NOTE — Telephone Encounter (Signed)
I have no idea how this was set up as a direct colonoscopy. All communication with her since 08/2013 colonoscopy stated I wanted to see her in office (by letter, phone).  I even clearly wrote on one letter that she would "unlikely benefit from further colonoscopies"   Please cancel her upcoming colonoscopy. She needs next rov with me in office (not to be double booked).

## 2015-04-18 NOTE — Telephone Encounter (Signed)
Pt is 81 years old and is scheduled for a colonoscopy 5-16 Monday. Her last colon was 08/2013 and she had polyps. She has a hx of rectal cancer, FHCC, and hx colon polyps. Pt has colostomy and she states her stoma bleeds all the time.  She has had it cauterized and it still bleeds She has a spot on her left kidney that they are watching and being followed by Dr Blair Dolphin at [facility] urology Pt states she has ? Rectal prolapse. Where she had her rectum sewn together its separating like a hernia. During the day she can feel this area dropping down. She states it feels like a ball she is sitting on bt it retracts at night while she is sleeping . She states it bothers her to walk and it feels like she is going to have a BM. Surgeon told her to repair this it was an all day surgery and with the risks they did not want to do this . Her medical hx is mostly unchanged except above mentioned and new dx of scoliosis.  Do you want to proceed with her colon or does she need an OV? Please advise Marijean Niemann

## 2015-04-19 ENCOUNTER — Ambulatory Visit: Payer: Medicare Other | Admitting: Urology

## 2015-04-20 ENCOUNTER — Ambulatory Visit (INDEPENDENT_AMBULATORY_CARE_PROVIDER_SITE_OTHER): Payer: Medicare Other | Admitting: Ophthalmology

## 2015-05-02 ENCOUNTER — Encounter: Payer: TRICARE For Life (TFL) | Admitting: Gastroenterology

## 2015-05-13 ENCOUNTER — Ambulatory Visit (INDEPENDENT_AMBULATORY_CARE_PROVIDER_SITE_OTHER): Payer: Medicare Other | Admitting: Internal Medicine

## 2015-05-13 ENCOUNTER — Encounter: Payer: Self-pay | Admitting: Internal Medicine

## 2015-05-13 ENCOUNTER — Other Ambulatory Visit: Payer: Self-pay | Admitting: *Deleted

## 2015-05-13 VITALS — BP 130/90 | HR 64 | Temp 98.4°F | Resp 20 | Ht 63.0 in | Wt 131.0 lb

## 2015-05-13 DIAGNOSIS — E039 Hypothyroidism, unspecified: Secondary | ICD-10-CM

## 2015-05-13 DIAGNOSIS — J069 Acute upper respiratory infection, unspecified: Secondary | ICD-10-CM

## 2015-05-13 DIAGNOSIS — I10 Essential (primary) hypertension: Secondary | ICD-10-CM | POA: Diagnosis not present

## 2015-05-13 DIAGNOSIS — R2681 Unsteadiness on feet: Secondary | ICD-10-CM | POA: Diagnosis not present

## 2015-05-13 DIAGNOSIS — B9789 Other viral agents as the cause of diseases classified elsewhere: Secondary | ICD-10-CM

## 2015-05-13 LAB — TSH: TSH: 1.99 u[IU]/mL (ref 0.35–4.50)

## 2015-05-13 MED ORDER — DILTIAZEM HCL ER BEADS 180 MG PO CP24: 180.0000 mg | ORAL_CAPSULE | Freq: Every day | ORAL | Status: DC

## 2015-05-13 MED ORDER — DILTIAZEM HCL ER BEADS 180 MG PO CP24
180.0000 mg | ORAL_CAPSULE | Freq: Every day | ORAL | Status: DC
Start: 2015-05-13 — End: 2016-05-17

## 2015-05-13 NOTE — Patient Instructions (Signed)
Acute bronchitis symptoms for less than 10 days are generally not helped by antibiotics.  Take over-the-counter expectorants and cough medications such as  Mucinex DM.  Call if there is no improvement in 5 to 7 days or if  you develop worsening cough, fever, or new symptoms, such as shortness of breath or chest pain.   

## 2015-05-13 NOTE — Progress Notes (Signed)
Subjective:    Patient ID: Sherry [name], female    DOB: 08/12/1923, 81 y.o.   MRN: [id number]  HPI  81 year old patient who presents with a 2 day history of some chest congestion and cough.  There is been no wheezing, productive sputum or fever.  No shortness of breath.  She does have albuterol inhaler when necessary.  She does have remote history of mild COPD She has treated hypertension and also history of palpitations.  Presently is on low-dose metoprolol.  Has been on diltiazem in the past.  Past Medical History  Diagnosis Date  . CAD (coronary artery disease)   . Hyperlipidemia   . Hypertension   . Anemia   . COPD (chronic obstructive pulmonary disease)   . Headache(784.0)   . Hypothyroidism   . Osteoporosis   . Shingles   . Arthritis   . Tendon adhesions     torn tendon right shoulder  . Rectal adenocarcinoma dx'd 08/2009  . Rectal cancer   . Shortness of breath   . GERD (gastroesophageal reflux disease)   . History of blood transfusion   . Emphysema of lung   . Scoliosis     per Dr Melony Overly ortho    History   Social History  . Marital Status: Married    Spouse Name: N/A  . Number of Children: 3  . Years of Education: N/A   Occupational History  . Not on file.   Social History Main Topics  . Smoking status: Former Smoker -- 36 years    Quit date: 12/18/2003  . Smokeless tobacco: Never Used     Comment: quit 2005  . Alcohol Use: No  . Drug Use: No  . Sexual Activity: No   Other Topics Concern  . Not on file   Social History Narrative    Past Surgical History  Procedure Laterality Date  . Appendectomy    . Cholecystectomy    . Vaginal hysterectomy    . Rectum removal      1/11 DR TSUEI  . Colon removal      8 " 12/2009 DR TSUEI  . Colon surgery      apr for rectal cancer 2011  . Cardiac catheterization    . Eye surgery      Cataract with lens- bil  . Colostomy    . Parastomal hernia repair  10/30/2012  . Ventral hernia repair   10/30/2012    Procedure: LAPAROSCOPIC VENTRAL HERNIA;  Surgeon: Imogene Burn. Georgette Dover, MD;  Location: [facility];  Service: General;  Laterality: N/A;  Laparoscopic repair of parastomal hernia  . Colonoscopy    . Polypectomy    . Oophorectomy    . Ovary tumor removal      after son was born  . Tubal ligation      Family History  Problem Relation Age of Onset  . Heart failure Father   . Cancer Mother     HEAD AND NECK  . Heart failure Sister   . Cervical cancer Sister   . Heart failure Brother   . Colon cancer Brother   . Colon cancer Brother   . Cervical cancer Daughter   . Kidney cancer Daughter     Allergies  Allergen Reactions  . Percocet [Oxycodone-Acetaminophen] Other (See Comments)    Upsets stomach  . Influenza Virus Vacc Split Pf Other (See Comments)    Reaction unknown  . Sulfamethoxazole-Trimethoprim Other (See Comments)    Reaction unknown  Current Outpatient Prescriptions on File Prior to Visit  Medication Sig Dispense Refill  . albuterol (PROVENTIL HFA;VENTOLIN HFA) 108 (90 BASE) MCG/ACT inhaler Inhale 2 puffs into the lungs every 4 (four) hours as needed. For shortness of breath 3 Inhaler 3  . atorvastatin (LIPITOR) 10 MG tablet Take 1 tablet (10 mg total) by mouth daily. 90 tablet 3  . ibuprofen (ADVIL,MOTRIN) 200 MG tablet Take 200 mg by mouth every 6 (six) hours as needed.    Marland Kitchen levothyroxine (SYNTHROID, LEVOTHROID) 75 MCG tablet TAKE 1 TABLET DAILY 90 tablet 3  . meclizine (ANTIVERT) 25 MG tablet Take 1 tablet (25 mg total) by mouth 3 (three) times daily as needed for dizziness. 30 tablet 1  . metoprolol succinate (TOPROL-XL) 25 MG 24 hr tablet TAKE ONE-HALF (12.5 MG TOTAL) TABLET DAILY 90 tablet 3  . oxybutynin (DITROPAN-XL) 10 MG 24 hr tablet Take 1 tablet (10 mg total) by mouth daily. 90 tablet 3  . traMADol-acetaminophen (ULTRACET) 37.5-325 MG per tablet Take 1 tablet by mouth every 6 (six) hours as needed for moderate pain. 60 tablet 5  . MOVIPREP 100 G SOLR  Take 1 kit (200 g total) by mouth once. moviprep as directed. No substitutions (Patient not taking: Reported on 05/13/2015) 1 kit 0   No current facility-administered medications on file prior to visit.    BP 130/90 mmHg  Pulse 64  Temp(Src) 98.4 F (36.9 C) (Oral)  Resp 20  Ht '5\' 3"'  (1.6 m)  Wt 131 lb (59.421 kg)  BMI 23.21 kg/m2  SpO2 96%     Review of Systems  Constitutional: Negative.   HENT: Negative for congestion, dental problem, hearing loss, rhinorrhea, sinus pressure, sore throat and tinnitus.   Eyes: Negative for pain, discharge and visual disturbance.  Respiratory: Positive for cough. Negative for shortness of breath.   Cardiovascular: Negative for chest pain, palpitations and leg swelling.  Gastrointestinal: Negative for nausea, vomiting, abdominal pain, diarrhea, constipation, blood in stool and abdominal distention.  Genitourinary: Negative for dysuria, urgency, frequency, hematuria, flank pain, vaginal bleeding, vaginal discharge, difficulty urinating, vaginal pain and pelvic pain.  Musculoskeletal: Negative for joint swelling, arthralgias and gait problem.  Skin: Negative for rash.  Neurological: Negative for dizziness, syncope, speech difficulty, weakness, numbness and headaches.  Hematological: Negative for adenopathy.  Psychiatric/Behavioral: Negative for behavioral problems, dysphoric mood and agitation. The patient is not nervous/anxious.        Objective:   Physical Exam  Constitutional: She is oriented to person, place, and time. She appears well-developed and well-nourished.  HENT:  Head: Normocephalic.  Right Ear: External ear normal.  Left Ear: External ear normal.  Mouth/Throat: Oropharynx is clear and moist.  Eyes: Conjunctivae and EOM are normal. Pupils are equal, round, and reactive to light.  Neck: Normal range of motion. Neck supple. No thyromegaly present.  Cardiovascular: Normal rate, regular rhythm, normal heart sounds and intact distal  pulses.   Pulmonary/Chest: Effort normal and breath sounds normal.  Abdominal: Soft. Bowel sounds are normal. She exhibits no mass. There is no tenderness.  Musculoskeletal: Normal range of motion.  Lymphadenopathy:    She has no cervical adenopathy.  Neurological: She is alert and oriented to person, place, and time.  Skin: Skin is warm and dry. No rash noted.  Psychiatric: She has a normal mood and affect. Her behavior is normal.          Assessment & Plan:   Are URI with cough Hypertension.  Suboptimal control.  Repeat  blood pressure 160/100.  Will resume diltiazem

## 2015-05-13 NOTE — Progress Notes (Signed)
Pre visit review using our clinic review tool, if applicable. No additional management support is needed unless otherwise documented below in the visit note. 

## 2015-05-17 ENCOUNTER — Ambulatory Visit: Payer: Medicare Other | Admitting: Urology

## 2015-05-27 ENCOUNTER — Telehealth: Payer: Self-pay | Admitting: Oncology

## 2015-05-27 NOTE — Telephone Encounter (Signed)
Lft msg for pt confirming labs/ov r/s due to provider schedule, mailed out schedule... KJ  °

## 2015-06-13 ENCOUNTER — Encounter: Payer: Self-pay | Admitting: Family Medicine

## 2015-06-13 ENCOUNTER — Ambulatory Visit (INDEPENDENT_AMBULATORY_CARE_PROVIDER_SITE_OTHER): Payer: Medicare Other | Admitting: Family Medicine

## 2015-06-13 VITALS — BP 112/62 | HR 78 | Temp 100.5°F | Ht 63.0 in | Wt 132.0 lb

## 2015-06-13 DIAGNOSIS — J020 Streptococcal pharyngitis: Secondary | ICD-10-CM

## 2015-06-13 DIAGNOSIS — J40 Bronchitis, not specified as acute or chronic: Secondary | ICD-10-CM

## 2015-06-13 LAB — POCT RAPID STREP A (OFFICE): Rapid Strep A Screen: NEGATIVE

## 2015-06-13 MED ORDER — METHYLPREDNISOLONE 4 MG PO TBPK: ORAL_TABLET | ORAL | Status: DC

## 2015-06-13 MED ORDER — HYDROCODONE-HOMATROPINE 5-1.5 MG/5ML PO SYRP: 5.0000 mL | ORAL_SOLUTION | ORAL | Status: DC | PRN

## 2015-06-13 MED ORDER — AZITHROMYCIN 250 MG PO TABS: ORAL_TABLET | ORAL | Status: DC

## 2015-06-13 NOTE — Progress Notes (Signed)
   Subjective:    Patient ID: Sherry [name], female    DOB: Feb 17, 1923, 81 y.o.   MRN: [id number]  HPI Here for 4 days of PND, ST, fever, and coughing up green sputum. Drinking fluids and using Mucinex.    Review of Systems  Constitutional: Positive for fever.  HENT: Positive for congestion and postnasal drip.   Eyes: Negative.   Respiratory: Positive for cough and chest tightness.   Cardiovascular: Negative.        Objective:   Physical Exam  Constitutional: She appears well-developed and well-nourished.  HENT:  Right Ear: External ear normal.  Left Ear: External ear normal.  Nose: Nose normal.  Mouth/Throat: No oropharyngeal exudate.  Posterior OP is red without exudate  Eyes: Conjunctivae are normal.  Cardiovascular: Normal rate, regular rhythm, normal heart sounds and intact distal pulses.   Pulmonary/Chest: Effort normal. She has no wheezes. She has no rales.  Scattered ronchi  Lymphadenopathy:    She has no cervical adenopathy.          Assessment & Plan:  Bronchitis. Treat with a Zpack and a Medrol dose pack.

## 2015-06-13 NOTE — Progress Notes (Signed)
Pre visit review using our clinic review tool, if applicable. No additional management support is needed unless otherwise documented below in the visit note. 

## 2015-06-14 ENCOUNTER — Ambulatory Visit: Payer: Medicare Other | Admitting: Urology

## 2015-06-21 ENCOUNTER — Ambulatory Visit (INDEPENDENT_AMBULATORY_CARE_PROVIDER_SITE_OTHER): Payer: Medicare Other | Admitting: Gastroenterology

## 2015-06-21 ENCOUNTER — Encounter: Payer: Self-pay | Admitting: Gastroenterology

## 2015-06-21 VITALS — BP 118/68 | HR 58 | Ht 60.5 in | Wt 131.5 lb

## 2015-06-21 DIAGNOSIS — Z85038 Personal history of other malignant neoplasm of large intestine: Secondary | ICD-10-CM | POA: Diagnosis not present

## 2015-06-21 DIAGNOSIS — C189 Malignant neoplasm of colon, unspecified: Secondary | ICD-10-CM

## 2015-06-21 NOTE — Patient Instructions (Signed)
Follow up with Dr. Georgette Dover for ostomy site blood.

## 2015-06-21 NOTE — Progress Notes (Signed)
Review of pertinent gastrointestinal problems:  1. Rectal cancer Diagnosed by incomplete colonoscopy at outside facility August 2010. Endoscopic ultrasound by me September 2010 showed a T3N0 rectal adenocarcinoma; She underwent neoadjuvant chemotherapy and radiation. Eventual abdominal peritoneal resection January 2011 , surgical pathology showed T2 N1 lesion. Adjuvant chemotherapy for 2 cycles caused a lot of symptoms and she stopped. Colonoscopy 03/2011 ([facility]) found several more neoplastic lesions, all adenomas without HGD, one was piecemeal resected, site labled with Ink. Colonoscopy 2014 Ardis Hughs, two polyps both TAs, largest was 1.4cm piecemeal resected.   HPI: This is a    very pleasant 81 year old woman whom I last saw about 2 years ago.  Chief complaint is history of colon cancer  Just getting over bronchitis. Gets around with walker at times, sometimes a cane.  About a year.  Has rectal prolapse.  Was evaluated by Dr. Marcello Moores, referred by Dr. Georgette Dover but deferred on surgery.  She does well with ostomy.  Has bleeding site periodically.  This has been treated with silver nitrate sticks here in our office and also by her surgeon with cautery   Past Medical History  Diagnosis Date  . CAD (coronary artery disease)   . Hyperlipidemia   . Hypertension   . Anemia   . COPD (chronic obstructive pulmonary disease)   . Headache(784.0)   . Hypothyroidism   . Osteoporosis   . Shingles   . Arthritis   . Tendon adhesions     torn tendon right shoulder  . Rectal adenocarcinoma dx'd 08/2009  . Rectal cancer   . Shortness of breath   . GERD (gastroesophageal reflux disease)   . History of blood transfusion   . Emphysema of lung   . Scoliosis     per Dr Melony Overly ortho  . Bronchitis     Past Surgical History  Procedure Laterality Date  . Appendectomy    . Cholecystectomy    . Vaginal hysterectomy    . Rectum removal      1/11 DR TSUEI  . Colon removal      8 " 12/2009 DR TSUEI  .  Colon surgery      apr for rectal cancer 2011  . Cardiac catheterization    . Eye surgery      Cataract with lens- bil  . Colostomy    . Parastomal hernia repair  10/30/2012  . Ventral hernia repair  10/30/2012    Procedure: LAPAROSCOPIC VENTRAL HERNIA;  Surgeon: Imogene Burn. Georgette Dover, MD;  Location: [facility];  Service: General;  Laterality: N/A;  Laparoscopic repair of parastomal hernia  . Colonoscopy    . Polypectomy    . Oophorectomy    . Ovary tumor removal      after son was born  . Tubal ligation      Current Outpatient Prescriptions  Medication Sig Dispense Refill  . albuterol (PROVENTIL HFA;VENTOLIN HFA) 108 (90 BASE) MCG/ACT inhaler Inhale 2 puffs into the lungs every 4 (four) hours as needed. For shortness of breath 3 Inhaler 3  . atorvastatin (LIPITOR) 10 MG tablet Take 1 tablet (10 mg total) by mouth daily. 90 tablet 3  . azithromycin (ZITHROMAX Z-PAK) 250 MG tablet As directed 6 each 0  . diltiazem (TIAZAC) 180 MG 24 hr capsule Take 1 capsule (180 mg total) by mouth daily. 90 capsule 4  . HYDROcodone-homatropine (HYDROMET) 5-1.5 MG/5ML syrup Take 5 mLs by mouth every 4 (four) hours as needed. 240 mL 0  . ibuprofen (ADVIL,MOTRIN) 200 MG tablet Take  200 mg by mouth every 6 (six) hours as needed.    Marland Kitchen levothyroxine (SYNTHROID, LEVOTHROID) 75 MCG tablet TAKE 1 TABLET DAILY 90 tablet 3  . meclizine (ANTIVERT) 25 MG tablet Take 1 tablet (25 mg total) by mouth 3 (three) times daily as needed for dizziness. 30 tablet 1  . methylPREDNISolone (MEDROL DOSEPAK) 4 MG TBPK tablet As directed 21 tablet 0  . metoprolol succinate (TOPROL-XL) 25 MG 24 hr tablet TAKE ONE-HALF (12.5 MG TOTAL) TABLET DAILY 90 tablet 3  . oxybutynin (DITROPAN-XL) 10 MG 24 hr tablet Take 1 tablet (10 mg total) by mouth daily. 90 tablet 3  . traMADol-acetaminophen (ULTRACET) 37.5-325 MG per tablet Take 1 tablet by mouth every 6 (six) hours as needed for moderate pain. 60 tablet 5   No current facility-administered  medications for this visit.    Allergies as of 06/21/2015 - Review Complete 06/21/2015  Allergen Reaction Noted  . Percocet [oxycodone-acetaminophen] Other (See Comments) 11/28/2012  . Influenza virus vacc split pf Other (See Comments) 09/01/2009  . Sulfamethoxazole-trimethoprim Other (See Comments) 09/01/2009    Family History  Problem Relation Age of Onset  . Heart failure Father   . Cancer Mother     HEAD AND NECK  . Heart failure Sister   . Cervical cancer Sister   . Heart failure Brother   . Colon cancer Brother   . Colon cancer Brother   . Cervical cancer Daughter   . Kidney cancer Daughter     History   Social History  . Marital Status: Married    Spouse Name: N/A  . Number of Children: 3  . Years of Education: N/A   Occupational History  . Not on file.   Social History Main Topics  . Smoking status: Former Smoker -- 67 years    Quit date: 12/18/2003  . Smokeless tobacco: Never Used     Comment: quit 2005  . Alcohol Use: No  . Drug Use: No  . Sexual Activity: No   Other Topics Concern  . Not on file   Social History Narrative     Physical Exam: BP 118/68 mmHg  Pulse 58  Ht 5' 0.5" (1.537 m)  Wt 131 lb 8 oz (59.648 kg)  BMI 25.25 kg/m2 Constitutional: generally well-appearing Psychiatric: alert and oriented x3 Abdomen: soft, nontender, nondistended, no obvious ascites, no peritoneal signs, normal bowel sounds   Assessment and plan: 81 y.o. female with personal history of colon cancer, personal history of adenomatous polyps  She is 81 and I do not think we need to proceed with any further colon cancer screening or high risk surveillance testing. She has been bothered by a site on her ostomy that bleeds periodically. I treated this with silver nitrate sticks previously however we do not have any silver nitrate here in the office today. I recommended she contact her surgeon who has also helped her with this local ostomy bleeding with cautery. She  will call she has any further questions or concerns.   Owens Loffler, MD [facility] 06/21/2015, 2:14 PM

## 2015-06-22 ENCOUNTER — Telehealth: Payer: Self-pay | Admitting: Internal Medicine

## 2015-06-22 DIAGNOSIS — K9401 Colostomy hemorrhage: Secondary | ICD-10-CM | POA: Diagnosis not present

## 2015-06-22 DIAGNOSIS — Z85048 Personal history of other malignant neoplasm of rectum, rectosigmoid junction, and anus: Secondary | ICD-10-CM | POA: Diagnosis not present

## 2015-06-22 MED ORDER — NYSTATIN 100000 UNIT/ML MT SUSP: 5.0000 mL | OROMUCOSAL | Status: DC | PRN

## 2015-06-22 NOTE — Telephone Encounter (Signed)
Pt saw dr fry on 6/27 and was given prednisone.  Now pt states she has yeast inf in her mouth, (thrush) Would like something called in to  [facility], [place]

## 2015-06-22 NOTE — Telephone Encounter (Signed)
Dr. Sarajane Jews, pt saw you on 6/27 and was given prednisone and now she says she has yeast in her mouth would like something. Can you please prescribe due to Dr.K is out of the office, Thanks.

## 2015-06-22 NOTE — Telephone Encounter (Signed)
Call in Nystatin 100,000/ml mouth suspension, to use 5 ml every 4 hours prn, 300 ml with one rf

## 2015-06-22 NOTE — Telephone Encounter (Signed)
I sent script e-scribe and left a voice message for pt. 

## 2015-07-12 ENCOUNTER — Ambulatory Visit (INDEPENDENT_AMBULATORY_CARE_PROVIDER_SITE_OTHER): Payer: Medicare Other | Admitting: Urology

## 2015-07-12 DIAGNOSIS — N3941 Urge incontinence: Secondary | ICD-10-CM

## 2015-07-12 DIAGNOSIS — R351 Nocturia: Secondary | ICD-10-CM | POA: Diagnosis not present

## 2015-07-12 DIAGNOSIS — R350 Frequency of micturition: Secondary | ICD-10-CM

## 2015-07-25 DIAGNOSIS — H35341 Macular cyst, hole, or pseudohole, right eye: Secondary | ICD-10-CM | POA: Diagnosis not present

## 2015-07-28 DIAGNOSIS — R351 Nocturia: Secondary | ICD-10-CM | POA: Diagnosis not present

## 2015-08-23 DIAGNOSIS — H35341 Macular cyst, hole, or pseudohole, right eye: Secondary | ICD-10-CM | POA: Diagnosis not present

## 2015-09-27 ENCOUNTER — Other Ambulatory Visit (HOSPITAL_BASED_OUTPATIENT_CLINIC_OR_DEPARTMENT_OTHER): Payer: Medicare Other

## 2015-09-27 ENCOUNTER — Ambulatory Visit (HOSPITAL_BASED_OUTPATIENT_CLINIC_OR_DEPARTMENT_OTHER): Payer: Medicare Other | Admitting: Oncology

## 2015-09-27 VITALS — BP 158/69 | HR 58 | Temp 97.3°F | Resp 20 | Ht 60.5 in | Wt 136.6 lb

## 2015-09-27 DIAGNOSIS — Z9221 Personal history of antineoplastic chemotherapy: Secondary | ICD-10-CM

## 2015-09-27 DIAGNOSIS — C20 Malignant neoplasm of rectum: Secondary | ICD-10-CM | POA: Diagnosis not present

## 2015-09-27 DIAGNOSIS — Z85048 Personal history of other malignant neoplasm of rectum, rectosigmoid junction, and anus: Secondary | ICD-10-CM

## 2015-09-27 DIAGNOSIS — M549 Dorsalgia, unspecified: Secondary | ICD-10-CM | POA: Diagnosis not present

## 2015-09-27 DIAGNOSIS — Z923 Personal history of irradiation: Secondary | ICD-10-CM | POA: Diagnosis not present

## 2015-09-27 LAB — CBC WITH DIFFERENTIAL/PLATELET
BASO%: 0.7 % (ref 0.0–2.0)
Basophils Absolute: 0 10*3/uL (ref 0.0–0.1)
EOS%: 2.6 % (ref 0.0–7.0)
Eosinophils Absolute: 0.1 10*3/uL (ref 0.0–0.5)
HCT: 34.8 % (ref 34.8–46.6)
HEMOGLOBIN: 11.7 g/dL (ref 11.6–15.9)
LYMPH%: 10.4 % — ABNORMAL LOW (ref 14.0–49.7)
MCH: 33.6 pg (ref 25.1–34.0)
MCHC: 33.5 g/dL (ref 31.5–36.0)
MCV: 100.2 fL (ref 79.5–101.0)
MONO#: 0.4 10*3/uL (ref 0.1–0.9)
MONO%: 7.7 % (ref 0.0–14.0)
NEUT%: 78.6 % — ABNORMAL HIGH (ref 38.4–76.8)
NEUTROS ABS: 3.9 10*3/uL (ref 1.5–6.5)
PLATELETS: 244 10*3/uL (ref 145–400)
RBC: 3.48 10*6/uL — ABNORMAL LOW (ref 3.70–5.45)
RDW: 17.3 % — AB (ref 11.2–14.5)
WBC: 4.9 10*3/uL (ref 3.9–10.3)
lymph#: 0.5 10*3/uL — ABNORMAL LOW (ref 0.9–3.3)

## 2015-09-27 LAB — COMPREHENSIVE METABOLIC PANEL (CC13)
ALBUMIN: 3.7 g/dL (ref 3.5–5.0)
ALT: 10 U/L (ref 0–55)
ANION GAP: 7 meq/L (ref 3–11)
AST: 12 U/L (ref 5–34)
Alkaline Phosphatase: 127 U/L (ref 40–150)
BILIRUBIN TOTAL: 0.5 mg/dL (ref 0.20–1.20)
BUN: 14.4 mg/dL (ref 7.0–26.0)
CALCIUM: 10.1 mg/dL (ref 8.4–10.4)
CHLORIDE: 107 meq/L (ref 98–109)
CO2: 26 mEq/L (ref 22–29)
CREATININE: 0.8 mg/dL (ref 0.6–1.1)
EGFR: 69 mL/min/{1.73_m2} — ABNORMAL LOW (ref >90)
Glucose: 113 mg/dl (ref 70–140)
Potassium: 4.2 mEq/L (ref 3.5–5.1)
Sodium: 141 mEq/L (ref 136–145)
TOTAL PROTEIN: 6.3 g/dL — AB (ref 6.4–8.3)

## 2015-09-27 LAB — CEA: CEA: 2.4 ng/mL (ref 0.0–5.0)

## 2015-09-27 NOTE — Progress Notes (Signed)
Hematology and Oncology Follow Up Visit  Sherry [name] [id number] 1923/02/24 81 y.o. 09/27/2015 10:02 AM    Principle Diagnosis:  This is an 81 year old female diagnosed with a T3 N0 rectal adenocarcinoma by EUS criteria in September 2010.  She did have subsequently a T2 N1 stage III rectal cancer.   Prior Therapy:  1.  She received neoadjuvant therapy concomitantly with Xeloda, therapy concluded on October 31, 2010.  She received total 5040 cGy in 28 fractions. 2.  The patient received abdominoperineal resection done December 27, 2009.  She had a T2 N1 with 1 out of 9 lymph nodes involved. 3.  The patient received two cycles of adjuvant FOLFOX chemotherapy, last given was March 2011.  The patient discontinued chemotherapy due to her wishes and due to her intolerance with the small bowel obstruction that was recurrent.  Current therapy: Surveillance and watchful observation.  Interim History:  Sherry [name] presents today for a follow-up visit with her daughter. Since her last visit, she reports no new complaints. She continues to have arthritic knee and back pain which is has not changed dramatically.  She did not report any neurological deficits were difficulty ambulating. She still using a walker and did have a few falls without any injury. She does not report any abdominal pain or distention. She does report intermittent bleeding around her stoma.  She does not report any headaches, blurry vision, syncope or seizures. She does not report any nausea, vomiting, diarrhea, constipation.  She does not report any melena, hematochezia or hematuria, fevers, chills or night sweats.  She did not report any lymphadenopathy or petechiae. She does not report any leg edema orthopnea. Rest of her review of systems unremarkable.   Medications: I have reviewed the patient's current medications. Current Outpatient Prescriptions  Medication Sig Dispense Refill  . albuterol (PROVENTIL HFA;VENTOLIN HFA) 108 (90  BASE) MCG/ACT inhaler Inhale 2 puffs into the lungs every 4 (four) hours as needed. For shortness of breath 3 Inhaler 3  . atorvastatin (LIPITOR) 10 MG tablet Take 1 tablet (10 mg total) by mouth daily. 90 tablet 3  . azithromycin (ZITHROMAX Z-PAK) 250 MG tablet As directed 6 each 0  . diltiazem (TIAZAC) 180 MG 24 hr capsule Take 1 capsule (180 mg total) by mouth daily. 90 capsule 4  . HYDROcodone-homatropine (HYDROMET) 5-1.5 MG/5ML syrup Take 5 mLs by mouth every 4 (four) hours as needed. 240 mL 0  . ibuprofen (ADVIL,MOTRIN) 200 MG tablet Take 200 mg by mouth every 6 (six) hours as needed.    Marland Kitchen levothyroxine (SYNTHROID, LEVOTHROID) 75 MCG tablet TAKE 1 TABLET DAILY 90 tablet 3  . meclizine (ANTIVERT) 25 MG tablet Take 1 tablet (25 mg total) by mouth 3 (three) times daily as needed for dizziness. 30 tablet 1  . methylPREDNISolone (MEDROL DOSEPAK) 4 MG TBPK tablet As directed 21 tablet 0  . metoprolol succinate (TOPROL-XL) 25 MG 24 hr tablet TAKE ONE-HALF (12.5 MG TOTAL) TABLET DAILY 90 tablet 3  . nystatin (MYCOSTATIN) 100000 UNIT/ML suspension Take 5 mLs (500,000 Units total) by mouth every 4 (four) hours as needed. Swish and spit 300 mL 1  . oxybutynin (DITROPAN-XL) 10 MG 24 hr tablet Take 1 tablet (10 mg total) by mouth daily. 90 tablet 3  . traMADol-acetaminophen (ULTRACET) 37.5-325 MG per tablet Take 1 tablet by mouth every 6 (six) hours as needed for moderate pain. 60 tablet 5   No current facility-administered medications for this visit.    Allergies:  Allergies  Allergen  Reactions  . Percocet [Oxycodone-Acetaminophen] Other (See Comments)    Upsets stomach  . Influenza Virus Vacc Split Pf Other (See Comments)    Reaction unknown  . Sulfamethoxazole-Trimethoprim Other (See Comments)    Reaction unknown    Past Medical History, Surgical history, Social history, and Family History were reviewed and updated.   Physical Exam: Blood pressure 158/69, pulse 58, temperature 97.3 F  (36.3 C), temperature source Oral, resp. rate 20, height 5' 0.5" (1.537 m), weight 136 lb 9.6 oz (61.961 kg), SpO2 99 %. ECOG: 1 General appearance: alert awake woman appearing younger than stated age without distress. Head: Normocephalic, without obvious abnormality no oral ulcers or lesions. Neck: no adenopathy Lymph nodes: Cervical, supraclavicular, and axillary nodes normal. Heart:regular rate and rhythm, S1, S2 normal, no murmur, click, rub or gallop Lung:chest clear, no wheezing, rales, normal symmetric air entry Abdomin: soft, non-tender, without masses or organomegaly. Colostomy site appeared normal. EXT:no erythema, induration, or nodules Neurological: No deficits noted.  Lab Results: Lab Results  Component Value Date   WBC 4.9 09/27/2015   HGB 11.7 09/27/2015   HCT 34.8 09/27/2015   MCV 100.2 09/27/2015   PLT 244 09/27/2015     Chemistry      Component Value Date/Time   NA 136 09/29/2014 0952   NA 140 10/01/2013 1301   NA 140 08/04/2012 0949   K 3.6 09/29/2014 0952   K 4.0 10/01/2013 1301   K 4.1 08/04/2012 0949   CL 103 09/29/2014 0952   CL 106 04/01/2013 1506   CL 100 08/04/2012 0949   CO2 28 09/29/2014 0952   CO2 28 10/01/2013 1301   CO2 30 08/04/2012 0949   BUN 12 09/29/2014 0952   BUN 13.0 10/01/2013 1301   BUN 16 08/04/2012 0949   CREATININE 0.7 09/29/2014 0952   CREATININE 0.8 10/01/2013 1301   CREATININE 0.68 07/27/2013 1407      Component Value Date/Time   CALCIUM 10.3 09/29/2014 0952   CALCIUM 10.5* 10/01/2013 1301   CALCIUM 9.8 08/04/2012 0949   ALKPHOS 114 09/29/2014 0952   ALKPHOS 142 10/01/2013 1301   ALKPHOS 131* 08/04/2012 0949   AST 18 09/29/2014 0952   AST 12 10/01/2013 1301   AST 20 08/04/2012 0949   ALT 13 09/29/2014 0952   ALT 9 10/01/2013 1301   ALT 24 08/04/2012 0949   BILITOT 0.7 09/29/2014 0952   BILITOT 0.44 10/01/2013 1301   BILITOT 0.60 08/04/2012 0949     EXAM: NUCLEAR MEDICINE PET SKULL BASE TO  THIGH  TECHNIQUE: 6.6 mCi F-18 FDG was injected intravenously. Full-ring PET imaging was performed from the skull base to thigh after the radiotracer. CT data was obtained and used for attenuation correction and anatomic localization.  FASTING BLOOD GLUCOSE: Value: 91 mg/dl  COMPARISON: None.  FINDINGS: NECK  No hypermetabolic lymph nodes in the neck. Diffuse uptake throughout both lobes of the thyroid gland identified.  CHEST  No hypermetabolic mediastinal or hilar nodes. No suspicious pulmonary nodules on the CT scan. Calcified atherosclerotic disease involves the thoracic aorta as well as the LAD, RCA coronary arteries. No pericardial effusion. Calcified right paratracheal lymph node identified. No enlarged or hypermetabolic mediastinal or hilar adenopathy. No pleural effusion identified. There are moderate changes of centrilobular emphysema. Calcified granuloma identified in the right upper lobe. No suspicious pulmonary nodules noted.  ABDOMEN/PELVIS  No abnormal hypermetabolic activity within the liver, pancreas, adrenal glands, or spleen. Calcified granulomas identified within the liver and spleen. No hypermetabolic lymph  nodes in the abdomen or pelvis. Calcified atherosclerotic disease involves the abdominal aorta. The aorta is dilated measuring up to 3.2 cm in maximum AP dimension. There is a left lower quadrant colostomy. Parastomal hernia contains nonobstructed loops of large bowel. There is pelvic floor laxity with caudal herniation of nonobstructed large and small bowel loops.  SKELETON  No focal hypermetabolic activity to suggest skeletal metastasis.  IMPRESSION: 1. There is no evidence for residual or recurrent hypermetabolic tumor within the chest abdomen or pelvis. The 2. Diffuse radiotracer uptake within the thyroid gland. Correlate for any clinical signs or symptoms of thyroiditis. 3. Atherosclerotic disease including multi vessel  coronary artery calcification and aneurysmal dilatation of the abdominal aorta. Recommend follow up by [place] in 3years. This recommendation follows ACR consensus guidelines: White Paper of the ACR Incidental Findings Committee II on Vascular Findings. Joellyn Rued [id number]; 09:811-914.   Impression and Plan: This is an 81 year old female with the following issues;  1.  Stage III colorectal cancer, she is status post surgical resection in January 2011.  She did not tolerate the course of adjuvant chemotherapy.  She is currently on observation and surveillance.   Her last imaging studies obtain on 10/08/2014 which included a PET scan. These results were reviewed and discussed with the patient again and continues to be disease free. She is now close to 5 years out from her operation without any evidence to suggest relapse. I do not recommend any further oncology follow-up at this time and I'll be happy to see her in the future as needed.  2.  Back pain: Unclear etiology with differential diagnosis includes osteoarthritis and spinal stenosis without any evidence of malignancy.  3.  Surveillance colonoscopies: this was done 2014. No further colonoscopy will be needed given her age and the complication associated with the actual procedure. I certainly agree with that at this time.  4.  Abdominal  hernia. She is S/P repair and doing well.   5.  Follow up: As needed    Sherron Mummert, MD 10/11/201610:02 AM

## 2015-09-30 ENCOUNTER — Ambulatory Visit: Payer: Medicare Other | Admitting: Oncology

## 2015-09-30 ENCOUNTER — Other Ambulatory Visit: Payer: Medicare Other

## 2015-10-10 ENCOUNTER — Other Ambulatory Visit: Payer: Self-pay | Admitting: Internal Medicine

## 2015-10-26 ENCOUNTER — Other Ambulatory Visit: Payer: Self-pay | Admitting: Internal Medicine

## 2015-11-19 ENCOUNTER — Encounter (HOSPITAL_COMMUNITY): Payer: Self-pay | Admitting: Emergency Medicine

## 2015-11-19 ENCOUNTER — Emergency Department (INDEPENDENT_AMBULATORY_CARE_PROVIDER_SITE_OTHER)
Admission: EM | Admit: 2015-11-19 | Discharge: 2015-11-19 | Disposition: A | Discharge status: Home / Self Care | Payer: Medicare Other | Source: Home / Self Care | Attending: Family Medicine | Admitting: Family Medicine

## 2015-11-19 DIAGNOSIS — J069 Acute upper respiratory infection, unspecified: Secondary | ICD-10-CM

## 2015-11-19 NOTE — Discharge Instructions (Signed)
Upper Respiratory Infection, Adult For drainage recommend Allegra, Claritin or Zyrtec. Delsym for cough as needed. Drink plenty of fluids and stay well-hydrated.  Hopefully [place] will win  Most upper respiratory infections (URIs) are a viral infection of the air passages leading to the lungs. A URI affects the nose, throat, and upper air passages. The most common type of URI is nasopharyngitis and is typically referred to as "the common cold." URIs run their course and usually go away on their own. Most of the time, a URI does not require medical attention, but sometimes a bacterial infection in the upper airways can follow a viral infection. This is called a secondary infection. Sinus and middle ear infections are common types of secondary upper respiratory infections. Bacterial pneumonia can also complicate a URI. A URI can worsen asthma and chronic obstructive pulmonary disease (COPD). Sometimes, these complications can require emergency medical care and may be life threatening.  CAUSES Almost all URIs are caused by viruses. A virus is a type of germ and can spread from one person to another.  RISKS FACTORS You may be at risk for a URI if:   You smoke.   You have chronic heart or lung disease.  You have a weakened defense (immune) system.   You are very young or very old.   You have nasal allergies or asthma.  You work in crowded or poorly ventilated areas.  You work in health care facilities or schools. SIGNS AND SYMPTOMS  Symptoms typically develop 2-3 days after you come in contact with a cold virus. Most viral URIs last 7-10 days. However, viral URIs from the influenza virus (flu virus) can last 14-18 days and are typically more severe. Symptoms may include:   Runny or stuffy (congested) nose.   Sneezing.   Cough.   Sore throat.   Headache.   Fatigue.   Fever.   Loss of appetite.   Pain in your forehead, behind your eyes, and over your cheekbones  (sinus pain).  Muscle aches.  DIAGNOSIS  Your health care provider may diagnose a URI by:  Physical exam.  Tests to check that your symptoms are not due to another condition such as:  Strep throat.  Sinusitis.  Pneumonia.  Asthma. TREATMENT  A URI goes away on its own with time. It cannot be cured with medicines, but medicines may be prescribed or recommended to relieve symptoms. Medicines may help:  Reduce your fever.  Reduce your cough.  Relieve nasal congestion. HOME CARE INSTRUCTIONS   Take medicines only as directed by your health care provider.   Gargle warm saltwater or take cough drops to comfort your throat as directed by your health care provider.  Use a warm mist humidifier or inhale steam from a shower to increase air moisture. This may make it easier to breathe.  Drink enough fluid to keep your urine clear or pale yellow.   Eat soups and other clear broths and maintain good nutrition.   Rest as needed.   Return to work when your temperature has returned to normal or as your health care provider advises. You may need to stay home longer to avoid infecting others. You can also use a face mask and careful hand washing to prevent spread of the virus.  Increase the usage of your inhaler if you have asthma.   Do not use any tobacco products, including cigarettes, chewing tobacco, or electronic cigarettes. If you need help quitting, ask your health care provider. PREVENTION  The  best way to protect yourself from getting a cold is to practice good hygiene.   Avoid oral or hand contact with people with cold symptoms.   Wash your hands often if contact occurs.  There is no clear evidence that vitamin C, vitamin E, echinacea, or exercise reduces the chance of developing a cold. However, it is always recommended to get plenty of rest, exercise, and practice good nutrition.  SEEK MEDICAL CARE IF:   You are getting worse rather than better.   Your  symptoms are not controlled by medicine.   You have chills.  You have worsening shortness of breath.  You have brown or red mucus.  You have yellow or brown nasal discharge.  You have pain in your face, especially when you bend forward.  You have a fever.  You have swollen neck glands.  You have pain while swallowing.  You have white areas in the back of your throat. SEEK IMMEDIATE MEDICAL CARE IF:   You have severe or persistent:  Headache.  Ear pain.  Sinus pain.  Chest pain.  You have chronic lung disease and any of the following:  Wheezing.  Prolonged cough.  Coughing up blood.  A change in your usual mucus.  You have a stiff neck.  You have changes in your:  Vision.  Hearing.  Thinking.  Mood. MAKE SURE YOU:   Understand these instructions.  Will watch your condition.  Will get help right away if you are not doing well or get worse.   This information is not intended to replace advice given to you by your health care provider. Make sure you discuss any questions you have with your health care provider.   Document Released: 05/29/2001 Document Revised: 04/19/2015 Document Reviewed: 03/10/2014 Elsevier Interactive Patient Education [employer].

## 2015-11-19 NOTE — ED Notes (Signed)
Cough, cold, congestion, low grade temperature, productive cough of thick, white/yellow/green phlegm

## 2015-11-19 NOTE — ED Provider Notes (Signed)
CSN: [id number]     Arrival date & time 11/19/15  1309 History   First MD Initiated Contact with Patient 11/19/15 1401     Chief Complaint  Patient presents with  . URI   (Consider location/radiation/quality/duration/timing/severity/associated sxs/prior Treatment) HPI Comments: This is a pleasant well-preserved 81 year old female with a complaint of cough, PND, minor sore throat that seems to come and go, nasal stuffiness and some shortness of breath. She also states that she had a fever of 99.4 at home. Is 99.3 here she has a history of COPD. She does not currently smoke.  Patient is a 81 y.o. female presenting with URI.  URI Presenting symptoms: congestion, cough, fever and rhinorrhea   Presenting symptoms: no fatigue   Associated symptoms: no neck pain     Past Medical History  Diagnosis Date  . CAD (coronary artery disease)   . Hyperlipidemia   . Hypertension   . Anemia   . COPD (chronic obstructive pulmonary disease) ([place])   . Headache(784.0)   . Hypothyroidism   . Osteoporosis   . Shingles   . Arthritis   . Tendon adhesions     torn tendon right shoulder  . Rectal adenocarcinoma ([place]) dx'd 08/2009  . Rectal cancer ([place])   . Shortness of breath   . GERD (gastroesophageal reflux disease)   . History of blood transfusion   . Emphysema of lung ([place])   . Scoliosis     per Dr Melony Overly ortho  . Bronchitis    Past Surgical History  Procedure Laterality Date  . Appendectomy    . Cholecystectomy    . Vaginal hysterectomy    . Rectum removal      1/11 DR TSUEI  . Colon removal      8 " 12/2009 DR TSUEI  . Colon surgery      apr for rectal cancer 2011  . Cardiac catheterization    . Eye surgery      Cataract with lens- bil  . Colostomy    . Parastomal hernia repair  10/30/2012  . Ventral hernia repair  10/30/2012    Procedure: LAPAROSCOPIC VENTRAL HERNIA;  Surgeon: Imogene Burn. Georgette Dover, MD;  Location: [facility];  Service: General;  Laterality: N/A;  Laparoscopic repair  of parastomal hernia  . Colonoscopy    . Polypectomy    . Oophorectomy    . Ovary tumor removal      after son was born  . Tubal ligation     Family History  Problem Relation Age of Onset  . Heart failure Father   . Cancer Mother     HEAD AND NECK  . Heart failure Sister   . Cervical cancer Sister   . Heart failure Brother   . Colon cancer Brother   . Colon cancer Brother   . Cervical cancer Daughter   . Kidney cancer Daughter    Social History  Substance Use Topics  . Smoking status: Former Smoker -- 57 years    Quit date: 12/18/2003  . Smokeless tobacco: Never Used     Comment: quit 2005  . Alcohol Use: No   OB History    No data available     Review of Systems  Constitutional: Positive for fever and activity change. Negative for chills, appetite change and fatigue.       Decreased energy level  HENT: Positive for congestion, postnasal drip and rhinorrhea. Negative for facial swelling.   Eyes: Negative.   Respiratory: Positive for cough and  shortness of breath.   Cardiovascular: Negative.   Musculoskeletal: Negative for neck pain and neck stiffness.  Skin: Negative for pallor and rash.  Neurological: Negative.     Allergies  Percocet; Influenza virus vacc split pf; and Sulfamethoxazole-trimethoprim  Home Medications   Prior to Admission medications   Medication Sig Start Date End Date Taking? Authorizing Provider  albuterol (PROVENTIL HFA;VENTOLIN HFA) 108 (90 BASE) MCG/ACT inhaler Inhale 2 puffs into the lungs every 4 (four) hours as needed. For shortness of breath 09/29/14   Marletta Lor, MD  atorvastatin (LIPITOR) 10 MG tablet TAKE 1 TABLET DAILY 10/26/15   Marletta Lor, MD  azithromycin [facility] Z-PAK) 250 MG tablet As directed 06/13/15   Laurey Morale, MD  diltiazem [facility]) 180 MG 24 hr capsule Take 1 capsule (180 mg total) by mouth daily. 05/13/15   Marletta Lor, MD  HYDROcodone-homatropine (HYDROMET) 5-1.5 MG/5ML syrup Take 5 mLs  by mouth every 4 (four) hours as needed. 06/13/15   Laurey Morale, MD  ibuprofen (ADVIL,MOTRIN) 200 MG tablet Take 200 mg by mouth every 6 (six) hours as needed.    Historical Provider, MD  levothyroxine (SYNTHROID, LEVOTHROID) 75 MCG tablet TAKE 1 TABLET DAILY 10/26/15   Marletta Lor, MD  meclizine (ANTIVERT) 25 MG tablet Take 1 tablet (25 mg total) by mouth 3 (three) times daily as needed for dizziness. 10/26/14   Marletta Lor, MD  methylPREDNISolone (MEDROL DOSEPAK) 4 MG TBPK tablet As directed 06/13/15   Laurey Morale, MD  nystatin (MYCOSTATIN) 100000 UNIT/ML suspension Take 5 mLs (500,000 Units total) by mouth every 4 (four) hours as needed. Swish and spit 06/22/15   Laurey Morale, MD  oxybutynin (DITROPAN-XL) 10 MG 24 hr tablet TAKE 1 TABLET DAILY 10/26/15   Marletta Lor, MD  TOPROL XL 25 MG 24 hr tablet TAKE ONE-HALF (1/2) TABLET (12.5 MG TOTAL) DAILY 10/10/15   Marletta Lor, MD  traMADol-acetaminophen (ULTRACET) 37.5-325 MG per tablet Take 1 tablet by mouth every 6 (six) hours as needed for moderate pain. 03/03/15   Marletta Lor, MD   Meds Ordered and Administered this Visit  Medications - No data to display  BP 124/69 mmHg  Pulse 71  Temp(Src) 99.3 F (37.4 C) (Oral)  SpO2 96% No data found.   Physical Exam  Constitutional: She appears well-developed and well-nourished. No distress.  HENT:  Bilateral TMs are clear and normal appearing. Oropharynx widely patent. No swelling. Minimal light streaky erythema. No cobblestoning, exudates or swelling.  Eyes: Conjunctivae and EOM are normal.  Neck: Normal range of motion. Neck supple.  Cardiovascular: Normal rate, regular rhythm and normal heart sounds.   Pulmonary/Chest: Effort normal and breath sounds normal. No respiratory distress. She has no wheezes. She has no rales.  Lungs with good expansion. Inspiratory and expiratory phases are equal. Good Air movement throughout all lung fields. No adventitious  sounds whatsoever.  Musculoskeletal: She exhibits no edema.  Lymphadenopathy:    She has no cervical adenopathy.  Neurological: She is alert.  Skin: Skin is warm and dry.  Nursing note and vitals reviewed.   ED Course  Procedures (including critical care time)  Labs Review Labs Reviewed - No data to display  Imaging Review No results found.   Visual Acuity Review  Right Eye Distance:   Left Eye Distance:   Bilateral Distance:    Right Eye Near:   Left Eye Near:    Bilateral Near:  MDM   1. URI (upper respiratory infection)    Patient is quite alert and stable. She has no observable respiratory distress or difficulty. Lungs are perfectly clear. No other abnormalities. May use nonsedating any histamines as directed For drainage recommend Allegra, Claritin or Zyrtec. Delsym for cough as needed. Drink plenty of fluids and stay well-hydrated.  Hopefully [place] will win      Janne Napoleon, NP 11/19/15 1447  Janne Napoleon, NP 11/19/15 1447

## 2015-11-21 ENCOUNTER — Encounter: Payer: Self-pay | Admitting: Internal Medicine

## 2015-11-21 ENCOUNTER — Ambulatory Visit (INDEPENDENT_AMBULATORY_CARE_PROVIDER_SITE_OTHER): Payer: Medicare Other | Admitting: Internal Medicine

## 2015-11-21 ENCOUNTER — Ambulatory Visit (INDEPENDENT_AMBULATORY_CARE_PROVIDER_SITE_OTHER)
Admission: RE | Admit: 2015-11-21 | Discharge: 2015-11-21 | Disposition: A | Discharge status: Home / Self Care | Payer: Medicare Other | Source: Ambulatory Visit | Attending: Internal Medicine | Admitting: Internal Medicine

## 2015-11-21 VITALS — BP 124/72 | HR 73 | Temp 98.3°F | Wt 136.1 lb

## 2015-11-21 DIAGNOSIS — R0602 Shortness of breath: Secondary | ICD-10-CM | POA: Diagnosis not present

## 2015-11-21 DIAGNOSIS — R05 Cough: Secondary | ICD-10-CM

## 2015-11-21 DIAGNOSIS — R059 Cough, unspecified: Secondary | ICD-10-CM

## 2015-11-21 DIAGNOSIS — J441 Chronic obstructive pulmonary disease with (acute) exacerbation: Secondary | ICD-10-CM

## 2015-11-21 DIAGNOSIS — J988 Other specified respiratory disorders: Secondary | ICD-10-CM

## 2015-11-21 DIAGNOSIS — J320 Chronic maxillary sinusitis: Secondary | ICD-10-CM

## 2015-11-21 DIAGNOSIS — J22 Unspecified acute lower respiratory infection: Secondary | ICD-10-CM

## 2015-11-21 MED ORDER — PREDNISONE 20 MG PO TABS: ORAL_TABLET | ORAL | Status: DC

## 2015-11-21 MED ORDER — AMOXICILLIN-POT CLAVULANATE 875-125 MG PO TABS: 1.0000 | ORAL_TABLET | Freq: Two times a day (BID) | ORAL | Status: DC

## 2015-11-21 NOTE — Progress Notes (Signed)
Pre visit review using our clinic review tool, if applicable. No additional management support is needed unless otherwise documented below in the visit note.   Chief Complaint  Patient presents with  . ED Follow Up/URI    HPI: Sherry [name] 81 y.o. comes in with daughter today  Fu ed   PCP NA  Over a week of   cough malaise  And thick green phlegm  and now sob  .  "My teeth hurt and i dont have any" on the left  Low grade temp 99 .   Was seen in  uc ed  12 5 and felt to gave uri  Chest clear and taking  Hydrocodone zyrtec and   Advised deslym  Coughing all night   Getting worse  or certainly not better  Feels like copd issue  Has used pred in past  Has acute inhaler also ROS: See pertinent positives and negatives per HPI.  Past Medical History  Diagnosis Date  . CAD (coronary artery disease)   . Hyperlipidemia   . Hypertension   . Anemia   . COPD (chronic obstructive pulmonary disease) ([place])   . Headache(784.0)   . Hypothyroidism   . Osteoporosis   . Shingles   . Arthritis   . Tendon adhesions     torn tendon right shoulder  . Rectal adenocarcinoma ([place]) dx'd 08/2009  . Rectal cancer ([place])   . Shortness of breath   . GERD (gastroesophageal reflux disease)   . History of blood transfusion   . Emphysema of lung ([place])   . Scoliosis     per Dr Melony Overly ortho  . Bronchitis     Family History  Problem Relation Age of Onset  . Heart failure Father   . Cancer Mother     HEAD AND NECK  . Heart failure Sister   . Cervical cancer Sister   . Heart failure Brother   . Colon cancer Brother   . Colon cancer Brother   . Cervical cancer Daughter   . Kidney cancer Daughter     Social History   Social History  . Marital Status: Married    Spouse Name: N/A  . Number of Children: 3  . Years of Education: N/A   Social History Main Topics  . Smoking status: Former Smoker -- 68 years    Quit date: 12/18/2003  . Smokeless tobacco: Never Used     Comment: quit 2005  .  Alcohol Use: No  . Drug Use: No  . Sexual Activity: No   Other Topics Concern  . None   Social History Narrative    Outpatient Prescriptions Prior to Visit  Medication Sig Dispense Refill  . albuterol (PROVENTIL HFA;VENTOLIN HFA) 108 (90 BASE) MCG/ACT inhaler Inhale 2 puffs into the lungs every 4 (four) hours as needed. For shortness of breath 3 Inhaler 3  . atorvastatin (LIPITOR) 10 MG tablet TAKE 1 TABLET DAILY 90 tablet 1  . diltiazem (TIAZAC) 180 MG 24 hr capsule Take 1 capsule (180 mg total) by mouth daily. 90 capsule 4  . HYDROcodone-homatropine (HYDROMET) 5-1.5 MG/5ML syrup Take 5 mLs by mouth every 4 (four) hours as needed. 240 mL 0  . ibuprofen (ADVIL,MOTRIN) 200 MG tablet Take 200 mg by mouth every 6 (six) hours as needed.    Marland Kitchen levothyroxine (SYNTHROID, LEVOTHROID) 75 MCG tablet TAKE 1 TABLET DAILY 90 tablet 1  . meclizine (ANTIVERT) 25 MG tablet Take 1 tablet (25 mg total) by mouth 3 (three)  times daily as needed for dizziness. 30 tablet 1  . nystatin (MYCOSTATIN) 100000 UNIT/ML suspension Take 5 mLs (500,000 Units total) by mouth every 4 (four) hours as needed. Swish and spit 300 mL 1  . oxybutynin (DITROPAN-XL) 10 MG 24 hr tablet TAKE 1 TABLET DAILY 90 tablet 1  . TOPROL XL 25 MG 24 hr tablet TAKE ONE-HALF (1/2) TABLET (12.5 MG TOTAL) DAILY 90 tablet 1  . traMADol-acetaminophen (ULTRACET) 37.5-325 MG per tablet Take 1 tablet by mouth every 6 (six) hours as needed for moderate pain. 60 tablet 5  . azithromycin (ZITHROMAX Z-PAK) 250 MG tablet As directed 6 each 0  . methylPREDNISolone (MEDROL DOSEPAK) 4 MG TBPK tablet As directed 21 tablet 0   No facility-administered medications prior to visit.     EXAM:  BP 124/72 mmHg  Pulse 73  Temp(Src) 98.3 F (36.8 C) (Oral)  Wt 136 lb 1.6 oz (61.735 kg)  SpO2 98%  Body mass index is 26.13 kg/(m^2). WDWN in NAD  quiet respirations; mildly congested  somewhat hoarse. Non toxic .  Looks tired mild dyspnea  Ambulatory  HEENT:  Normocephalic ;atraumatic , Eyes;  PERRL, EOMs  Full, lids and conjunctiva clear,,Ears: no deformities, canals nl, TM landmarks normal, Nose: no deformity or discharge but congested;face  Left max mild  tender Mouth : OP clear without lesion or edema . Neck: Supple without adenopathy or masses or bruits Chest:   bs = no rales  Deep bronchial loose cough  CV:  S1-S2 no gallops or murmurs peripheral perfusion is normal Skin :nl perfusion and no acute rashes   Alert congition intact  ASSESSMENT AND PLAN:  Discussed the following assessment and plan:  Cough - Plan: DG Chest 2 View  Maxillary sinusitis, unspecified chronicity - left   Acute respiratory infection - bronchial and sinusitis left  by hx exam   Shortness of breath - Plan: DG Chest 2 View  COPD exacerbation (HCC) - Plan: DG Chest 2 View Add med based on x ray   -Patient advised to return or notify health care team  if symptoms worsen ,persist or new concerns arise.  Patient Instructions  Get x ray and will plan on  Antibiotic  Treatment as advised . You have a sinus infection.  Can stop  The zyretec at this time  Consideration of adding prednisone if not getting better.    [place] Brooking. Panosh M.D.

## 2015-11-21 NOTE — Patient Instructions (Signed)
Get x ray and will plan on  Antibiotic  Treatment as advised . You have a sinus infection.  Can stop  The zyretec at this time  Consideration of adding prednisone if not getting better.

## 2015-12-14 ENCOUNTER — Encounter: Payer: Self-pay | Admitting: *Deleted

## 2016-02-10 ENCOUNTER — Ambulatory Visit (INDEPENDENT_AMBULATORY_CARE_PROVIDER_SITE_OTHER): Payer: Medicare Other | Admitting: Internal Medicine

## 2016-02-10 ENCOUNTER — Other Ambulatory Visit: Payer: Self-pay | Admitting: Internal Medicine

## 2016-02-10 ENCOUNTER — Encounter: Payer: Self-pay | Admitting: Internal Medicine

## 2016-02-10 ENCOUNTER — Ambulatory Visit (HOSPITAL_COMMUNITY)
Admission: RE | Admit: 2016-02-10 | Discharge: 2016-02-10 | Disposition: A | Discharge status: Home / Self Care | Payer: Medicare Other | Source: Ambulatory Visit | Attending: Internal Medicine | Admitting: Internal Medicine

## 2016-02-10 VITALS — BP 110/78 | HR 72 | Temp 98.8°F | Resp 18 | Ht 60.5 in | Wt 138.0 lb

## 2016-02-10 DIAGNOSIS — C20 Malignant neoplasm of rectum: Secondary | ICD-10-CM | POA: Insufficient documentation

## 2016-02-10 DIAGNOSIS — R351 Nocturia: Secondary | ICD-10-CM

## 2016-02-10 DIAGNOSIS — I714 Abdominal aortic aneurysm, without rupture: Secondary | ICD-10-CM | POA: Diagnosis not present

## 2016-02-10 DIAGNOSIS — Z85048 Personal history of other malignant neoplasm of rectum, rectosigmoid junction, and anus: Secondary | ICD-10-CM

## 2016-02-10 DIAGNOSIS — I10 Essential (primary) hypertension: Secondary | ICD-10-CM

## 2016-02-10 DIAGNOSIS — R109 Unspecified abdominal pain: Secondary | ICD-10-CM | POA: Insufficient documentation

## 2016-02-10 DIAGNOSIS — R93422 Abnormal radiologic findings on diagnostic imaging of left kidney: Secondary | ICD-10-CM | POA: Diagnosis not present

## 2016-02-10 DIAGNOSIS — N201 Calculus of ureter: Secondary | ICD-10-CM | POA: Insufficient documentation

## 2016-02-10 DIAGNOSIS — R300 Dysuria: Secondary | ICD-10-CM | POA: Diagnosis not present

## 2016-02-10 DIAGNOSIS — N815 Vaginal enterocele: Secondary | ICD-10-CM

## 2016-02-10 DIAGNOSIS — Z933 Colostomy status: Secondary | ICD-10-CM | POA: Insufficient documentation

## 2016-02-10 LAB — POC URINALSYSI DIPSTICK (AUTOMATED)
Glucose, UA: NEGATIVE
Nitrite, UA: NEGATIVE
PH UA: 6
SPEC GRAV UA: 1.02
Urobilinogen, UA: 1

## 2016-02-10 LAB — POCT I-STAT CREATININE: Creatinine, Ser: 0.7 mg/dL (ref 0.44–1.00)

## 2016-02-10 MED ORDER — CIPROFLOXACIN HCL 500 MG PO TABS: 500.0000 mg | ORAL_TABLET | Freq: Two times a day (BID) | ORAL | Status: DC

## 2016-02-10 MED ORDER — IOHEXOL 300 MG/ML  SOLN
100.0000 mL | Freq: Once | INTRAMUSCULAR | Status: AC | PRN
  Administered 2016-02-10: 100 mL via INTRAVENOUS

## 2016-02-10 MED ORDER — METRONIDAZOLE 500 MG PO TABS: 500.0000 mg | ORAL_TABLET | Freq: Three times a day (TID) | ORAL | Status: DC

## 2016-02-10 NOTE — Progress Notes (Signed)
Subjective:    Patient ID: Sherry [name], female    DOB: [birth year], 82 y.o.   MRN: [id number]  HPI  82 year old patient who has a prior history of rectal cancer status post AP resection with colostomy who presents with a one-week history of abdominal pain.  She states this is more right-sided and radiates to the hip and back.  She has had some anorexia but denies significant nausea and no episodes of vomiting.  She has had some dysuria and also complains of some lower extremity edema.  Denies any fever or chills. She has taken some milk of magnesia without benefit  An abdominal CT scan in 2015 revealed: Status post abdominoperineal resection with left lower quadrant colostomy. Associated enterocele/prolapse of small bowel through the pelvic floor, either reflecting pelvic floor laxity or frank herniation (series 2/ image 71). This has progressed from the prior. No evidence of bowel obstruction.  Past Medical History  Diagnosis Date  . CAD (coronary artery disease)   . Hyperlipidemia   . Hypertension   . Anemia   . COPD (chronic obstructive pulmonary disease) ([place])   . Headache(784.0)   . Hypothyroidism   . Osteoporosis   . Shingles   . Arthritis   . Tendon adhesions     torn tendon right shoulder  . Rectal adenocarcinoma ([place]) dx'd 08/2009  . Rectal cancer ([place])   . Shortness of breath   . GERD (gastroesophageal reflux disease)   . History of blood transfusion   . Emphysema of lung ([place])   . Scoliosis     per Dr Melony Overly ortho  . Bronchitis     Social History   Social History  . Marital Status: Married    Spouse Name: N/A  . Number of Children: 3  . Years of Education: N/A   Occupational History  . Not on file.   Social History Main Topics  . Smoking status: Former Smoker -- 8 years    Quit date: 12/18/2003  . Smokeless tobacco: Never Used     Comment: quit 2005  . Alcohol Use: No  . Drug Use: No  . Sexual Activity: No   Other Topics Concern  .  Not on file   Social History Narrative    Past Surgical History  Procedure Laterality Date  . Appendectomy    . Cholecystectomy    . Vaginal hysterectomy    . Rectum removal      1/11 DR TSUEI  . Colon removal      8 " 12/2009 DR TSUEI  . Colon surgery      apr for rectal cancer 2011  . Cardiac catheterization    . Eye surgery      Cataract with lens- bil  . Colostomy    . Parastomal hernia repair  10/30/2012  . Ventral hernia repair  10/30/2012    Procedure: LAPAROSCOPIC VENTRAL HERNIA;  Surgeon: Imogene Burn. Georgette Dover, MD;  Location: [facility];  Service: General;  Laterality: N/A;  Laparoscopic repair of parastomal hernia  . Colonoscopy    . Polypectomy    . Oophorectomy    . Ovary tumor removal      after son was born  . Tubal ligation      Family History  Problem Relation Age of Onset  . Heart failure Father   . Cancer Mother     HEAD AND NECK  . Heart failure Sister   . Cervical cancer Sister   . Heart failure Brother   .  Colon cancer Brother   . Colon cancer Brother   . Cervical cancer Daughter   . Kidney cancer Daughter     Allergies  Allergen Reactions  . Percocet [Oxycodone-Acetaminophen] Other (See Comments)    Upsets stomach  . Influenza Virus Vacc Split Pf Other (See Comments)    Reaction unknown  . Sulfamethoxazole-Trimethoprim Other (See Comments)    Reaction unknown    Current Outpatient Prescriptions on File Prior to Visit  Medication Sig Dispense Refill  . albuterol (PROVENTIL HFA;VENTOLIN HFA) 108 (90 BASE) MCG/ACT inhaler Inhale 2 puffs into the lungs every 4 (four) hours as needed. For shortness of breath 3 Inhaler 3  . atorvastatin (LIPITOR) 10 MG tablet TAKE 1 TABLET DAILY 90 tablet 1  . diltiazem (TIAZAC) 180 MG 24 hr capsule Take 1 capsule (180 mg total) by mouth daily. 90 capsule 4  . ibuprofen (ADVIL,MOTRIN) 200 MG tablet Take 200 mg by mouth every 6 (six) hours as needed.    Marland Kitchen levothyroxine (SYNTHROID, LEVOTHROID) 75 MCG tablet TAKE 1  TABLET DAILY 90 tablet 1  . meclizine (ANTIVERT) 25 MG tablet Take 1 tablet (25 mg total) by mouth 3 (three) times daily as needed for dizziness. 30 tablet 1  . nystatin (MYCOSTATIN) 100000 UNIT/ML suspension Take 5 mLs (500,000 Units total) by mouth every 4 (four) hours as needed. Swish and spit 300 mL 1  . oxybutynin (DITROPAN-XL) 10 MG 24 hr tablet TAKE 1 TABLET DAILY 90 tablet 1  . TOPROL XL 25 MG 24 hr tablet TAKE ONE-HALF (1/2) TABLET (12.5 MG TOTAL) DAILY 90 tablet 1  . traMADol-acetaminophen (ULTRACET) 37.5-325 MG per tablet Take 1 tablet by mouth every 6 (six) hours as needed for moderate pain. 60 tablet 5   No current facility-administered medications on file prior to visit.    BP 110/78 mmHg  Pulse 72  Temp(Src) 98.8 F (37.1 C) (Oral)  Resp 18  Ht 5' 0.5" (1.537 m)  Wt 138 lb (62.596 kg)  BMI 26.50 kg/m2  SpO2 98%     Review of Systems  Constitutional: Positive for activity change and appetite change. Negative for fever and chills.  HENT: Negative for congestion, dental problem, hearing loss, rhinorrhea, sinus pressure, sore throat and tinnitus.   Eyes: Negative for pain, discharge and visual disturbance.  Respiratory: Negative for cough and shortness of breath.   Cardiovascular: Negative for chest pain, palpitations and leg swelling.  Gastrointestinal: Positive for abdominal pain. Negative for nausea, vomiting, diarrhea, constipation, blood in stool and abdominal distention.  Genitourinary: Negative for dysuria, urgency, frequency, hematuria, flank pain, vaginal bleeding, vaginal discharge, difficulty urinating, vaginal pain and pelvic pain.  Musculoskeletal: Negative for joint swelling, arthralgias and gait problem.  Skin: Negative for rash.  Neurological: Negative for dizziness, syncope, speech difficulty, weakness, numbness and headaches.  Hematological: Negative for adenopathy.  Psychiatric/Behavioral: Negative for behavioral problems, dysphoric mood and agitation.  The patient is not nervous/anxious.        Objective:   Physical Exam  Constitutional: She is oriented to person, place, and time. She appears well-developed and well-nourished. No distress.  Afebrile No distress No tachycardia  Able to transfer from a sitting position to the examining table with 1 person assistance  HENT:  Head: Normocephalic.  Right Ear: External ear normal.  Left Ear: External ear normal.  Mouth/Throat: Oropharynx is clear and moist.  Eyes: Conjunctivae and EOM are normal. Pupils are equal, round, and reactive to light.  Neck: Normal range of motion. Neck supple. No  thyromegaly present.  Cardiovascular: Normal rate, regular rhythm, normal heart sounds and intact distal pulses.   Pulmonary/Chest: Effort normal and breath sounds normal.  Abdominal: Soft. Bowel sounds are normal. She exhibits no distension and no mass. There is tenderness. There is no rebound.  Left-sided colostomy Bowel sounds normal Somewhat tympanitic but no obvious distention Generalized tenderness to palpation with mild guarding No rebound tenderness  Musculoskeletal: Normal range of motion.  Trace ankle and pedal edema  Lymphadenopathy:    She has no cervical adenopathy.  Neurological: She is alert and oriented to person, place, and time.  Skin: Skin is warm and dry. No rash noted.  Psychiatric: She has a normal mood and affect. Her behavior is normal.          Assessment & Plan:   Diffuse abdominal pain in a patient with a prior history of rectal cancer as well as other multiple abdominal and pelvic surgeries; history of enterocele/prolapse of small bowel through pelvic floor We'll check screening lab including CBC, amylase, and renal indices.  We'll schedule stat abdominal and pelvic CT scan with contrast  Will treat with metronidazole/Cipro for possible diverticulitis.  Recheck 3 days; patient has been instructed to report to the ED if worsening abdominal pain or fever or  vomiting  History of rectal cancer Essential hypertension

## 2016-02-10 NOTE — Patient Instructions (Signed)
Take your antibiotic as prescribed until ALL of it is gone, but stop if you develop a rash, swelling, or any side effects of the medication.  Contact our office as soon as possible if  there are side effects of the medication.  Return Monday for follow-up  Report to the ED for further evaluation if pain worsens or you develop fever

## 2016-02-10 NOTE — Progress Notes (Signed)
Pre visit review using our clinic review tool, if applicable. No additional management support is needed unless otherwise documented below in the visit note. 

## 2016-02-11 LAB — COMPREHENSIVE METABOLIC PANEL
ALT: 9 U/L (ref 6–29)
AST: 12 U/L (ref 10–35)
Albumin: 3.8 g/dL (ref 3.6–5.1)
Alkaline Phosphatase: 115 U/L (ref 33–130)
BILIRUBIN TOTAL: 0.6 mg/dL (ref 0.2–1.2)
BUN: 11 mg/dL (ref 7–25)
CHLORIDE: 106 mmol/L (ref 98–110)
CO2: 23 mmol/L (ref 20–31)
CREATININE: 0.72 mg/dL (ref 0.60–0.88)
Calcium: 10.2 mg/dL (ref 8.6–10.4)
Glucose, Bld: 93 mg/dL (ref 65–99)
POTASSIUM: 4.4 mmol/L (ref 3.5–5.3)
SODIUM: 140 mmol/L (ref 135–146)
TOTAL PROTEIN: 6.3 g/dL (ref 6.1–8.1)

## 2016-02-11 LAB — CBC WITH DIFFERENTIAL/PLATELET
Basophils Absolute: 0 10*3/uL (ref 0.0–0.1)
Basophils Relative: 0 % (ref 0–1)
EOS PCT: 2 % (ref 0–5)
Eosinophils Absolute: 0.1 10*3/uL (ref 0.0–0.7)
HEMATOCRIT: 35.8 % — AB (ref 36.0–46.0)
Hemoglobin: 11.8 g/dL — ABNORMAL LOW (ref 12.0–15.0)
LYMPHS ABS: 0.6 10*3/uL — AB (ref 0.7–4.0)
LYMPHS PCT: 13 % (ref 12–46)
MCH: 32.2 pg (ref 26.0–34.0)
MCHC: 33 g/dL (ref 30.0–36.0)
MCV: 97.8 fL (ref 78.0–100.0)
MONO ABS: 0.5 10*3/uL (ref 0.1–1.0)
MPV: 9.1 fL (ref 8.6–12.4)
Monocytes Relative: 10 % (ref 3–12)
Neutro Abs: 3.7 10*3/uL (ref 1.7–7.7)
Neutrophils Relative %: 75 % (ref 43–77)
Platelets: 235 10*3/uL (ref 150–400)
RBC: 3.66 MIL/uL — AB (ref 3.87–5.11)
RDW: 16.7 % — AB (ref 11.5–15.5)
WBC: 4.9 10*3/uL (ref 4.0–10.5)

## 2016-02-11 LAB — AMYLASE: Amylase: 71 U/L (ref 0–105)

## 2016-02-11 LAB — CEA: CEA: 2.5 ng/mL — ABNORMAL HIGH

## 2016-02-13 ENCOUNTER — Ambulatory Visit (INDEPENDENT_AMBULATORY_CARE_PROVIDER_SITE_OTHER): Payer: Medicare Other | Admitting: Internal Medicine

## 2016-02-13 ENCOUNTER — Encounter: Payer: Self-pay | Admitting: Internal Medicine

## 2016-02-13 ENCOUNTER — Other Ambulatory Visit: Payer: Self-pay | Admitting: Internal Medicine

## 2016-02-13 ENCOUNTER — Ambulatory Visit (HOSPITAL_COMMUNITY): Payer: Medicare Other

## 2016-02-13 VITALS — BP 130/80 | HR 57 | Temp 98.8°F | Resp 20 | Ht 60.5 in | Wt 138.0 lb

## 2016-02-13 DIAGNOSIS — N815 Vaginal enterocele: Secondary | ICD-10-CM

## 2016-02-13 DIAGNOSIS — C20 Malignant neoplasm of rectum: Secondary | ICD-10-CM | POA: Diagnosis not present

## 2016-02-13 NOTE — Progress Notes (Signed)
Subjective:    Patient ID: Sherry [name], female    DOB: 12/27/1922, 82 y.o.   MRN: [id number]  HPI  82 year old patient who has a remote history of rectal cancer and is status post colostomy.  She has a history of an acquired.  Pelvic enterocele.  She was seen last week complaining of worsening left-sided abdominal pain. She was seen in the office late Friday afternoon and laboratory studies and CT abdominal scan was obtained.  She was placed on dual antibiotic therapy for possible diverticulitis.  She has improved over the weekend  CT scan revealed no striking evidence for diverticulitis.  Past Medical History  Diagnosis Date  . CAD (coronary artery disease)   . Hyperlipidemia   . Hypertension   . Anemia   . COPD (chronic obstructive pulmonary disease) ([place])   . Headache(784.0)   . Hypothyroidism   . Osteoporosis   . Shingles   . Arthritis   . Tendon adhesions     torn tendon right shoulder  . Rectal adenocarcinoma ([place]) dx'd 08/2009  . Rectal cancer ([place])   . Shortness of breath   . GERD (gastroesophageal reflux disease)   . History of blood transfusion   . Emphysema of lung ([place])   . Scoliosis     per Dr Melony Overly ortho  . Bronchitis     Social History   Social History  . Marital Status: Married    Spouse Name: N/A  . Number of Children: 3  . Years of Education: N/A   Occupational History  . Not on file.   Social History Main Topics  . Smoking status: Former Smoker -- 103 years    Quit date: 12/18/2003  . Smokeless tobacco: Never Used     Comment: quit 2005  . Alcohol Use: No  . Drug Use: No  . Sexual Activity: No   Other Topics Concern  . Not on file   Social History Narrative    Past Surgical History  Procedure Laterality Date  . Appendectomy    . Cholecystectomy    . Vaginal hysterectomy    . Rectum removal      1/11 DR TSUEI  . Colon removal      8 " 12/2009 DR TSUEI  . Colon surgery      apr for rectal cancer 2011  . Cardiac  catheterization    . Eye surgery      Cataract with lens- bil  . Colostomy    . Parastomal hernia repair  10/30/2012  . Ventral hernia repair  10/30/2012    Procedure: LAPAROSCOPIC VENTRAL HERNIA;  Surgeon: Imogene Burn. Georgette Dover, MD;  Location: [facility];  Service: General;  Laterality: N/A;  Laparoscopic repair of parastomal hernia  . Colonoscopy    . Polypectomy    . Oophorectomy    . Ovary tumor removal      after son was born  . Tubal ligation      Family History  Problem Relation Age of Onset  . Heart failure Father   . Cancer Mother     HEAD AND NECK  . Heart failure Sister   . Cervical cancer Sister   . Heart failure Brother   . Colon cancer Brother   . Colon cancer Brother   . Cervical cancer Daughter   . Kidney cancer Daughter     Allergies  Allergen Reactions  . Percocet [Oxycodone-Acetaminophen] Other (See Comments)    Upsets stomach  . Influenza Virus Vacc Split Pf  Other (See Comments)    Reaction unknown  . Sulfamethoxazole-Trimethoprim Other (See Comments)    Reaction unknown    Current Outpatient Prescriptions on File Prior to Visit  Medication Sig Dispense Refill  . albuterol (PROVENTIL HFA;VENTOLIN HFA) 108 (90 BASE) MCG/ACT inhaler Inhale 2 puffs into the lungs every 4 (four) hours as needed. For shortness of breath 3 Inhaler 3  . atorvastatin (LIPITOR) 10 MG tablet TAKE 1 TABLET DAILY 90 tablet 1  . diltiazem (TIAZAC) 180 MG 24 hr capsule Take 1 capsule (180 mg total) by mouth daily. 90 capsule 4  . ibuprofen (ADVIL,MOTRIN) 200 MG tablet Take 200 mg by mouth every 6 (six) hours as needed.    Marland Kitchen levothyroxine (SYNTHROID, LEVOTHROID) 75 MCG tablet TAKE 1 TABLET DAILY 90 tablet 1  . meclizine (ANTIVERT) 25 MG tablet Take 1 tablet (25 mg total) by mouth 3 (three) times daily as needed for dizziness. 30 tablet 1  . nystatin (MYCOSTATIN) 100000 UNIT/ML suspension Take 5 mLs (500,000 Units total) by mouth every 4 (four) hours as needed. Swish and spit 300 mL 1  .  oxybutynin (DITROPAN-XL) 10 MG 24 hr tablet TAKE 1 TABLET DAILY 90 tablet 1  . TOPROL XL 25 MG 24 hr tablet TAKE ONE-HALF (1/2) TABLET (12.5 MG TOTAL) DAILY 90 tablet 1  . traMADol-acetaminophen (ULTRACET) 37.5-325 MG per tablet Take 1 tablet by mouth every 6 (six) hours as needed for moderate pain. 60 tablet 5   No current facility-administered medications on file prior to visit.    BP 130/80 mmHg  Pulse 57  Temp(Src) 98.8 F (37.1 C) (Oral)  Resp 20  Ht 5' 0.5" (1.537 m)  Wt 138 lb (62.596 kg)  BMI 26.50 kg/m2  SpO2 98%     Review of Systems  Constitutional: Negative.   HENT: Negative for congestion, dental problem, hearing loss, rhinorrhea, sinus pressure, sore throat and tinnitus.   Eyes: Negative for pain, discharge and visual disturbance.  Respiratory: Negative for cough and shortness of breath.   Cardiovascular: Negative for chest pain, palpitations and leg swelling.  Gastrointestinal: Positive for abdominal pain. Negative for nausea, vomiting, diarrhea, constipation, blood in stool and abdominal distention.  Genitourinary: Negative for dysuria, urgency, frequency, hematuria, flank pain, vaginal bleeding, vaginal discharge, difficulty urinating, vaginal pain and pelvic pain.  Musculoskeletal: Negative for joint swelling, arthralgias and gait problem.  Skin: Negative for rash.  Neurological: Negative for dizziness, syncope, speech difficulty, weakness, numbness and headaches.  Hematological: Negative for adenopathy.  Psychiatric/Behavioral: Negative for behavioral problems, dysphoric mood and agitation. The patient is not nervous/anxious.        Objective:   Physical Exam  Constitutional: She appears well-developed and well-nourished. No distress.  HENT:  Mouth/Throat: Oropharynx is clear and moist.  Cardiovascular: Normal rate and regular rhythm.   Pulmonary/Chest: Effort normal and breath sounds normal.  Abdominal: Soft. Bowel sounds are normal.  No significant  abdominal tenderness at this time Colostomy noted Bowel sounds normal          Assessment & Plan:   Abdominal pain, resolved.  Discontinue antibiotic therapy and observe History of rectal adenocarcinoma History of acquired pelvic enterocele.  Will observe Hypertension, stable

## 2016-02-13 NOTE — Progress Notes (Signed)
Pre visit review using our clinic review tool, if applicable. No additional management support is needed unless otherwise documented below in the visit note. 

## 2016-02-13 NOTE — Patient Instructions (Signed)
Discontinue antibiotic therapy  Call or return to clinic prn if these symptoms worsen or fail to improve as anticipated.  Return in 6 months for follow-up

## 2016-02-27 DIAGNOSIS — H31003 Unspecified chorioretinal scars, bilateral: Secondary | ICD-10-CM | POA: Diagnosis not present

## 2016-02-27 DIAGNOSIS — H52223 Regular astigmatism, bilateral: Secondary | ICD-10-CM | POA: Diagnosis not present

## 2016-02-27 DIAGNOSIS — H524 Presbyopia: Secondary | ICD-10-CM | POA: Diagnosis not present

## 2016-02-27 DIAGNOSIS — H5203 Hypermetropia, bilateral: Secondary | ICD-10-CM | POA: Diagnosis not present

## 2016-03-29 ENCOUNTER — Inpatient Hospital Stay (HOSPITAL_COMMUNITY): Payer: Medicare Other | Admitting: Certified Registered"

## 2016-03-29 ENCOUNTER — Ambulatory Visit (HOSPITAL_COMMUNITY)
Admission: AD | Admit: 2016-03-29 | Discharge: 2016-03-29 | Disposition: A | Discharge status: Home / Self Care | Payer: Medicare Other | Source: Ambulatory Visit | Attending: Urology | Admitting: Urology

## 2016-03-29 ENCOUNTER — Other Ambulatory Visit: Payer: Self-pay | Admitting: Urology

## 2016-03-29 ENCOUNTER — Encounter (HOSPITAL_COMMUNITY): Payer: Self-pay | Admitting: *Deleted

## 2016-03-29 ENCOUNTER — Encounter (HOSPITAL_COMMUNITY)
Admission: AD | Disposition: A | Discharge status: Home / Self Care | Payer: Self-pay | Source: Ambulatory Visit | Attending: Urology

## 2016-03-29 DIAGNOSIS — E7800 Pure hypercholesterolemia, unspecified: Secondary | ICD-10-CM | POA: Insufficient documentation

## 2016-03-29 DIAGNOSIS — M1990 Unspecified osteoarthritis, unspecified site: Secondary | ICD-10-CM | POA: Insufficient documentation

## 2016-03-29 DIAGNOSIS — J449 Chronic obstructive pulmonary disease, unspecified: Secondary | ICD-10-CM | POA: Insufficient documentation

## 2016-03-29 DIAGNOSIS — Z800 Family history of malignant neoplasm of digestive organs: Secondary | ICD-10-CM | POA: Diagnosis not present

## 2016-03-29 DIAGNOSIS — D4100 Neoplasm of uncertain behavior of unspecified kidney: Secondary | ICD-10-CM | POA: Diagnosis not present

## 2016-03-29 DIAGNOSIS — I129 Hypertensive chronic kidney disease with stage 1 through stage 4 chronic kidney disease, or unspecified chronic kidney disease: Secondary | ICD-10-CM | POA: Diagnosis not present

## 2016-03-29 DIAGNOSIS — Z79899 Other long term (current) drug therapy: Secondary | ICD-10-CM | POA: Diagnosis not present

## 2016-03-29 DIAGNOSIS — Z807 Family history of other malignant neoplasms of lymphoid, hematopoietic and related tissues: Secondary | ICD-10-CM | POA: Insufficient documentation

## 2016-03-29 DIAGNOSIS — C652 Malignant neoplasm of left renal pelvis: Secondary | ICD-10-CM | POA: Diagnosis not present

## 2016-03-29 DIAGNOSIS — Z8051 Family history of malignant neoplasm of kidney: Secondary | ICD-10-CM | POA: Diagnosis not present

## 2016-03-29 DIAGNOSIS — E039 Hypothyroidism, unspecified: Secondary | ICD-10-CM | POA: Insufficient documentation

## 2016-03-29 DIAGNOSIS — I2510 Atherosclerotic heart disease of native coronary artery without angina pectoris: Secondary | ICD-10-CM | POA: Diagnosis not present

## 2016-03-29 DIAGNOSIS — R109 Unspecified abdominal pain: Secondary | ICD-10-CM | POA: Diagnosis present

## 2016-03-29 DIAGNOSIS — Z85038 Personal history of other malignant neoplasm of large intestine: Secondary | ICD-10-CM | POA: Diagnosis not present

## 2016-03-29 DIAGNOSIS — N132 Hydronephrosis with renal and ureteral calculous obstruction: Secondary | ICD-10-CM | POA: Diagnosis not present

## 2016-03-29 DIAGNOSIS — I10 Essential (primary) hypertension: Secondary | ICD-10-CM | POA: Diagnosis not present

## 2016-03-29 DIAGNOSIS — Z0000 Encounter for general adult medical examination without abnormal findings: Secondary | ICD-10-CM | POA: Diagnosis not present

## 2016-03-29 DIAGNOSIS — N189 Chronic kidney disease, unspecified: Secondary | ICD-10-CM | POA: Diagnosis not present

## 2016-03-29 DIAGNOSIS — D4112 Neoplasm of uncertain behavior of left renal pelvis: Secondary | ICD-10-CM | POA: Diagnosis not present

## 2016-03-29 DIAGNOSIS — Z87891 Personal history of nicotine dependence: Secondary | ICD-10-CM | POA: Diagnosis not present

## 2016-03-29 DIAGNOSIS — Z933 Colostomy status: Secondary | ICD-10-CM | POA: Insufficient documentation

## 2016-03-29 DIAGNOSIS — N201 Calculus of ureter: Secondary | ICD-10-CM | POA: Diagnosis not present

## 2016-03-29 DIAGNOSIS — M549 Dorsalgia, unspecified: Secondary | ICD-10-CM | POA: Diagnosis not present

## 2016-03-29 HISTORY — PX: CYSTOSCOPY WITH RETROGRADE PYELOGRAM, URETEROSCOPY AND STENT PLACEMENT: SHX5789

## 2016-03-29 SURGERY — CYSTOURETEROSCOPY, WITH RETROGRADE PYELOGRAM AND STENT INSERTION
Anesthesia: General | Laterality: Left

## 2016-03-29 MED ORDER — DEXAMETHASONE SODIUM PHOSPHATE 10 MG/ML IJ SOLN
INTRAMUSCULAR | Status: DC | PRN
  Administered 2016-03-29: 10 mg via INTRAVENOUS

## 2016-03-29 MED ORDER — IOPAMIDOL (ISOVUE-300) INJECTION 61%
INTRAVENOUS | Status: AC
  Filled 2016-03-29: qty 50

## 2016-03-29 MED ORDER — FENTANYL CITRATE (PF) 100 MCG/2ML IJ SOLN
INTRAMUSCULAR | Status: AC
  Filled 2016-03-29: qty 2

## 2016-03-29 MED ORDER — ONDANSETRON HCL 4 MG/2ML IJ SOLN: 4.0000 mg | Freq: Four times a day (QID) | INTRAMUSCULAR | Status: DC | PRN

## 2016-03-29 MED ORDER — PHENAZOPYRIDINE HCL 200 MG PO TABS: 200.0000 mg | ORAL_TABLET | Freq: Three times a day (TID) | ORAL | Status: DC | PRN

## 2016-03-29 MED ORDER — PROPOFOL 10 MG/ML IV BOLUS
INTRAVENOUS | Status: DC | PRN
  Administered 2016-03-29: 50 mg via INTRAVENOUS
  Administered 2016-03-29: 100 mg via INTRAVENOUS

## 2016-03-29 MED ORDER — LACTATED RINGERS IV SOLN: INTRAVENOUS | Status: DC

## 2016-03-29 MED ORDER — LACTATED RINGERS IV SOLN
INTRAVENOUS | Status: DC | PRN
  Administered 2016-03-29: 18:00:00 via INTRAVENOUS

## 2016-03-29 MED ORDER — TAMSULOSIN HCL 0.4 MG PO CAPS
0.4000 mg | ORAL_CAPSULE | Freq: Once | ORAL | Status: DC
  Filled 2016-03-29: qty 1

## 2016-03-29 MED ORDER — FENTANYL CITRATE (PF) 100 MCG/2ML IJ SOLN: 25.0000 ug | INTRAMUSCULAR | Status: DC | PRN

## 2016-03-29 MED ORDER — PROPOFOL 10 MG/ML IV BOLUS
INTRAVENOUS | Status: AC
  Filled 2016-03-29: qty 20

## 2016-03-29 MED ORDER — HYDROCODONE-ACETAMINOPHEN 7.5-325 MG PO TABS: 1.0000 | ORAL_TABLET | ORAL | Status: DC | PRN

## 2016-03-29 MED ORDER — CEFAZOLIN SODIUM-DEXTROSE 2-4 GM/100ML-% IV SOLN
INTRAVENOUS | Status: AC
  Filled 2016-03-29: qty 100

## 2016-03-29 MED ORDER — DEXAMETHASONE SODIUM PHOSPHATE 10 MG/ML IJ SOLN
INTRAMUSCULAR | Status: AC
  Filled 2016-03-29: qty 1

## 2016-03-29 MED ORDER — ONDANSETRON HCL 4 MG/2ML IJ SOLN
INTRAMUSCULAR | Status: AC
  Filled 2016-03-29: qty 2

## 2016-03-29 MED ORDER — FENTANYL CITRATE (PF) 100 MCG/2ML IJ SOLN
INTRAMUSCULAR | Status: DC | PRN
  Administered 2016-03-29 (×2): 50 ug via INTRAVENOUS

## 2016-03-29 MED ORDER — SODIUM CHLORIDE 0.9 % IV SOLN
INTRAVENOUS | Status: DC | PRN
  Administered 2016-03-29: 50 mL

## 2016-03-29 MED ORDER — LIDOCAINE HCL (CARDIAC) 20 MG/ML IV SOLN
INTRAVENOUS | Status: DC | PRN
  Administered 2016-03-29: 40 mg via INTRAVENOUS

## 2016-03-29 MED ORDER — CEFAZOLIN SODIUM-DEXTROSE 2-4 GM/100ML-% IV SOLN
2.0000 g | INTRAVENOUS | Status: AC
  Administered 2016-03-29: 2 g via INTRAVENOUS
  Filled 2016-03-29: qty 100

## 2016-03-29 MED ORDER — OXYBUTYNIN CHLORIDE 5 MG PO TABS: 5.0000 mg | ORAL_TABLET | Freq: Once | ORAL | Status: DC

## 2016-03-29 MED ORDER — PHENAZOPYRIDINE HCL 200 MG PO TABS: 200.0000 mg | ORAL_TABLET | Freq: Once | ORAL | Status: DC

## 2016-03-29 MED ORDER — ONDANSETRON HCL 4 MG/2ML IJ SOLN
INTRAMUSCULAR | Status: DC | PRN
  Administered 2016-03-29: 4 mg via INTRAVENOUS

## 2016-03-29 SURGICAL SUPPLY — 20 items
BAG URO CATCHER STRL LF (MISCELLANEOUS) ×2 IMPLANT
BASKET DAKOTA 1.9FR 11X120 (BASKET) ×2 IMPLANT
BASKET ZERO TIP NITINOL 2.4FR (BASKET) IMPLANT
CATH INTERMIT  6FR 70CM (CATHETERS) ×2 IMPLANT
CLOTH BEACON ORANGE TIMEOUT ST (SAFETY) ×2 IMPLANT
FIBER LASER FLEXIVA 1000 (UROLOGICAL SUPPLIES) IMPLANT
FIBER LASER FLEXIVA 200 (UROLOGICAL SUPPLIES) ×2 IMPLANT
FIBER LASER FLEXIVA 365 (UROLOGICAL SUPPLIES) IMPLANT
FIBER LASER FLEXIVA 550 (UROLOGICAL SUPPLIES) IMPLANT
FIBER LASER TRAC TIP (UROLOGICAL SUPPLIES) IMPLANT
GLOVE BIOGEL M 8.0 STRL (GLOVE) ×2 IMPLANT
GOWN STRL REUS W/ TWL XL LVL3 (GOWN DISPOSABLE) ×1 IMPLANT
GOWN STRL REUS W/TWL XL LVL3 (GOWN DISPOSABLE) ×3 IMPLANT
GUIDEWIRE ANG ZIPWIRE 038X150 (WIRE) IMPLANT
GUIDEWIRE STR DUAL SENSOR (WIRE) IMPLANT
MANIFOLD NEPTUNE II (INSTRUMENTS) ×2 IMPLANT
PACK CYSTO (CUSTOM PROCEDURE TRAY) ×2 IMPLANT
SHEATH ACCESS URETERAL 38CM (SHEATH) ×2 IMPLANT
STENT URET 6FRX24 CONTOUR (STENTS) ×2 IMPLANT
TUBING CONNECTING 10 (TUBING) ×2 IMPLANT

## 2016-03-29 NOTE — Op Note (Signed)
PATIENT:  Sherry [name]  PRE-OPERATIVE DIAGNOSIS: 1. left Ureteral calculus 2. Left upper pole calyceal mass  POST-OPERATIVE DIAGNOSIS: Same  PROCEDURE:  1. Cystoscopy with left retrograde pyelogram including interpretation 2. Left ureteroscopy with laser lithotripsy and stone extraction 3. Biopsy of left upper pole calyceal mass 4. Left double-J stent placement  SURGEON: Claybon Jabs, MD  INDICATION: Sherry [name] is a 82 year old female who's had persistent left flank pain for sometime. A previous CT scan revealed a left ureteral stone and she presented today with worsening left-sided pain. A follow-up CT scan revealed a 6 mm stone in the left ureter with increased hydronephrosis. In addition a filling defect was seen in the upper pole which had been noted previously and was suspicious for transitional cell carcinoma. It had been growing slowly and been observed but there had been no tissue diagnosis.  ANESTHESIA:  General  EBL:  Minimal  DRAINS: 6 French, 24 cm double-J stent in the left ureter (no string)  SPECIMEN:  Left upper pole calyceal mass biopsy to pathology  DESCRIPTION OF PROCEDURE: The patient was taken to the major OR and placed on the table. General anesthesia was administered and then the patient was moved to the dorsal lithotomy position. The genitalia was sterilely prepped and draped. An official timeout was performed.  Initially the 41 French cystoscope with 30 lens was passed under direct vision into the bladder. The bladder was then fully inspected. It was noted be free of any tumors, stones or inflammatory lesions. Ureteral orifices were of normal configuration and position. A 6 French open-ended ureteral catheter was then passed through the cystoscope into the ureteral orifice in order to perform a left retrograde pyelogram.  A retrograde pyelogram was performed by injecting full-strength contrast up the left ureter under direct fluoroscopic control. It revealed  a filling defect in the mid ureter consistent with the stone seen on the preoperative CT. The remainder of the ureter was noted to be normal and there did appear to be a filling defect in the upper pole of the renal collecting system. I then passed a 0.038 inch floppy-tipped guidewire through the open ended catheter and into the area of the renal pelvis and this was left in place. The inner portion of a ureteral access sheath was then passed over the guidewire to gently dilate the intramural ureter. I then proceeded with ureteroscopy.  A 6 French rigid ureteroscope was then passed under direct into the bladder and into the left orifice and up the ureter. This however did not afford me the ability to visualize the stone as it appeared to be beyond the iliac vessel. I therefore passed the guidewire back through the rigid ureteroscope and then switched to the flexible ureteroscope and passed this over the guidewire up to the stone which was easily identified. The stone was identified and I felt it was too large to extract and therefore elected to proceed with laser lithotripsy. The 200  holmium laser fiber was used to fragment the stone. I then used the [place] basket to extract all of the large stone fragments and reinspection of the ureter ureteroscopically revealed no further stone fragments and no injury to the ureter.   I then passed the guidewire back through the ureteroscope into the area the renal pelvis and passed the ureteral access sheath over this up to the level of the renal pelvis and then removed the inner portion of the access sheath as well as guidewire. I passed the flexible ureteroscope  through the access sheath and was able to visualize the mass. There was some blood in the renal pelvis and visualization was not optimal but I was able to identify the mass and it appeared to be a papillary lesion most consistent with transitional cell carcinoma. I passed the basket through the ureteroscope and  was able to engage the lesion and was able to extract a large biopsy portion of the lesion. I then passed the guidewire through the ureteroscope and left this in place and removed the ureteroscope and the access sheath.  I then backloaded the cystoscope over the guidewire and passed the stent over the guidewire into the area of the renal pelvis. As the guidewire was removed good curl was noted in the renal pelvis. The bladder was drained and the cystoscope was then removed. The patient tolerated the procedure well no intraoperative complications.  PLAN OF CARE: Discharge to home after PACU  PATIENT DISPOSITION:  PACU - hemodynamically stable.

## 2016-03-29 NOTE — H&P (Deleted)
Seen today as a work-in for left flank/back pain since Feb.   Active Problems Problems  1. Back pain (M54.9)   Assessed By: Jimmey Ralph (Urology); Last Assessed: 29 Mar 2016 2. Calculus of left ureter (N20.1)   Assessed By: Jimmey Ralph (Urology); Last Assessed: 29 Mar 2016 3. Hydronephrosis with obstructing calculus (N13.2)   Assessed By: Jimmey Ralph (Urology); Last Assessed: 29 Mar 2016 4. 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 Alan Ripper seen today as a work-in for left back/flank pain since Feb.     GU hx:  Last seen 2016 for:    OVERACTIVE BLADDER    She has been on antimuscarinic therapy. At her last visit here, she was on Vesicare 5 mg a day. That seemed to help her. In the interim, she has been switched to generic oxybutynin ER, 10 mg a day. Her main complaint is nocturnal frequency-she has to urinate perhaps 3-4 times at night. She does have lower extremity edema. She is not on a diuretic.    LEFT RENAL NEOPLASM    She had a CT scan in February, 2014 for follow-up after parastomal hernia repair. This revealed an 8 mm left renal mass that enhanced with contrast. She was found on a recent CT scan June 2016 to have approximately 2 mm growth of this lesion.    April 2017 Interval hx:   States that this back/flank pain has been present for at least 2 months. Had CT for back pain but states was told nothing was really found. States she has no real relief with PRN Tramadol. Has had periods of "dark" urine. Denies n/v or f/c. Has had some constipation recently and took a laxative but colostomy still not "working well."   Past Medical History Problems  1. History of Arthritis 2. History of Colon Cancer 3. History of esophageal reflux (Z87.19) 4. History of hypercholesterolemia (Z86.39) 5. History of  hypertension (Z86.79) 6. History of Rectal Cancer  Surgical History Problems  1. History of Appendectomy 2. History of Cholecystectomy 3. History of Colon Surgery 4. History of Hernia Repair 5. History of Hysterectomy 6. History of Rectal Surgery  Current Meds 1. DilTIAZem HCl SOLN;  Therapy: (Recorded:18Feb2013) to Recorded 2. Furosemide 20 MG Oral Tablet; TAKE 1 TABLET DAILY;  Therapy: [phone] to (Evaluate:21Jul2017)  Requested for: 26Jul2016; Last  Rx:26Jul2016 Ordered 3. Lipitor 10 MG Oral Tablet;  Therapy: (Recorded:18Feb2013) to Recorded 4. Metoprolol Succinate ER 25 MG Oral Tablet Extended Release 24 Hour;  Therapy: (Recorded:18Feb2013) to Recorded 5. Oxybutynin Chloride ER 10 MG Oral Tablet Extended Release 24 Hour; TAKE 1 TABLET  DAILY-[name] needs office visit for more refills;  Therapy: 12Sep2014 to (Last Rx:08Sep2015)  Requested for: 08Sep2015 Ordered 6. Synthroid TABS;  Therapy: (Recorded:18Feb2013) to Recorded 7. Tramadol-Acetaminophen 37.5-325 MG Oral Tablet;  Therapy: (Recorded:26Jul2016) to Recorded 8. Ventolin HFA AERS;  Therapy: (Recorded:18Feb2013) to Recorded  Allergies  Sulfa Drugs Influenza (Split PF)   Family History Problems  1. Family history of Cervical Cancer : Sister 2. Family history of Colon Cancer : Brother 3. Family history of Colon Cancer : Brother 4. Family history of Congestive Heart Failure : Brother 5. Family history of Congestive Heart Failure : Father 42. Family history of Congestive Heart Failure : Brother 7. Family history of Death In The Family Father   passed at age 31 from CHF 53. Family  history of Death In The Family Mother   passed at age 52 from [place]. Family history of Family Health Status Number Of Children   1 son and 2 daughters (1 deceased) 20. Family history of Kidney Cancer : Daughter 52. Family history of Lymphoma : Mother  Social History Problems  1. Denied: History of Alcohol Use (History) 2.  Caffeine Use   2-3 cups per day 3. Former smoker [id number]   smoked 1 1/2 ppd for 50 yearsquit 2006 4. Occupation: Retired  Electronics engineer, constitutional, skin, eye, otolaryngeal, hematologic/lymphatic, cardiovascular, pulmonary, endocrine, musculoskeletal, gastrointestinal, neurological and psychiatric system(s) were reviewed and pertinent findings if present are noted and are otherwise negative.  Gastrointestinal: flank pain, abdominal pain and constipation.    Vitals Vital Signs [Data Includes: Last 1 Day]  Recorded: 13Apr2017 01:31PM  Blood Pressure: 118 / 66 Temperature: 97.7 F Heart Rate: 61  Physical Exam Constitutional: Well nourished and well developed . No acute distress. The patient appears well hydrated.  Abdomen: The abdomen is rounded. Moderate tenderness in the LUQ is present, but no RUQ tenderness, no tenderness in the RLQ and no LLQ tenderness. No right CVA tenderness and moderate left CVA tenderness. In the left lower quadrant a colostomy was present.  Skin: Normal skin turgor and normal skin color and pigmentation.  Neuro/Psych:. Mood and affect are appropriate.    Results/Data Urine [Data Includes: Last 1 Day]   13Apr2017  COLOR YELLOW   APPEARANCE CLEAR   SPECIFIC GRAVITY <1.005   pH 6.0   GLUCOSE NEGATIVE   BILIRUBIN NEGATIVE   KETONE NEGATIVE   BLOOD 3+   PROTEIN NEGATIVE   NITRITE NEGATIVE   LEUKOCYTE ESTERASE TRACE   SQUAMOUS EPITHELIAL/HPF 6-10 HPF  WBC 6-10 WBC/HPF  RBC 3-10 RBC/HPF  BACTERIA FEW HPF  CRYSTALS NONE SEEN HPF  CASTS NONE SEEN LPF  Yeast NONE SEEN HPF   The following images/tracing/specimen were independently visualized:  KUB: shows large amount of gas/stool throughout abdomen. Unable to visualize any stone material. RUS limited left: 11.2 X 1.0 X 5.6 X 6.0 cm. Severe hydronephrosis. Posterior renal cystic area: 1.0. Proximal ureteral dilatation: 1.3 cm. CT urogram shows severe hydronephrosis and dilated ureter  to level of 3-4 mm mid ureteral stone. This CT was reviewed by Dr. Jeffie Pollock.  The following clinical lab reports were reviewed:  UA: microscopic hematuria.  The following radiology reports were reviewed: I reviewed CT abd/pelvis from 2/17 which showed a 3 mm mid ureteral calculus with mild obstruction. When this was discussed with [name] and family they claim she was never told she had stone. She denies passing stone but has not been straining urine.  PVR: Ultrasound PVR 74.86 jets not seen ml. Selected Results  AU CT-STONE PROTOCOL 13Apr2017 12:00AM Jimmey Ralph   Test Name Result Flag Reference  CT-STONE PROTOCOL (Report)    ** RADIOLOGY REPORT BY [facility] **   CLINICAL DATA: Patient with history of microscopic hematuria and left flank pain.  EXAM: CT ABDOMEN AND PELVIS WITHOUT CONTRAST  TECHNIQUE: Multidetector CT imaging of the abdomen and pelvis was performed following the standard protocol without IV contrast.  COMPARISON: CT abdomen pelvis 02/10/2016  FINDINGS: Lower chest: Left lower lobe scarring. No pleural effusion.  Hepatobiliary: Multiple calcified granulomata within the liver.  Pancreas: Unremarkable  Spleen: Multiple calcified granulomata.  Adrenals/Urinary Tract: Adrenal glands are unremarkable. Interval worsening of now severe left hydroureteronephrosis to the level of the distal left ureter were there is an  obstructing 6 mm stone (image 44; series 2). Urinary bladder is unremarkable. Within the superior aspect of the left renal collecting system there is a persistent 1.6 cm partially calcified filling defect (image 18; series 2).  Stomach/Bowel: Left lower quadrant colostomy. No evidence for bowel obstruction. No free fluid or free intraperitoneal air.  Vascular/Lymphatic: Aneurysmal dilatation of the infrarenal abdominal aorta measuring 3.6 x 3.7 cm. No retroperitoneal lymphadenopathy.  Other: Patient status post abdominoperineal resection  with left lower quadrant ostomy. Re- demonstrated prolapse of bowel within the pelvic floor.  Musculoskeletal: Lumbar spine degenerative changes. No aggressive or acute appearing osseous lesions.  IMPRESSION: There is an obstructing 6 mm stone within the distal left ureter resulting in worsening now severe left hydroureteronephrosis.  Partially calcified mass within the superior aspect of the left renal collecting system concerning for the possibility of transitional cell carcinoma.  Unchanged aneurysmal dilatation of the infrarenal abdominal aorta measuring up to 3.7 cm. Recommend followup by ultrasound in 2 years. This recommendation follows ACR consensus guidelines: White Paper of the ACR Incidental Findings Committee II on Vascular Findings. J Am Coll Radiol 2013; 10:789-794. The  Postsurgical changes compatible with abdominoperineal resection with persistent herniated loops of bowel through the pelvic floor.   Electronically Signed  By: Lovey Newcomer M.D.  On: 03/29/2016 16:01   Assessment Assessed  1. Calculus of left ureter (N20.1) 2. Back pain (M54.9) 3. Hydronephrosis with obstructing calculus (N13.2) 4. 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 Administered: Ketorolac Tromethamine 30 MG/ML Injection Solution Hydronephrosis with obstructing calculus  2. Follow-up Week x 2 Office  Follow-up  Status: Hold For - Date of Service  Requested for:  [id number]  Ketorolac 30 mg IM today     Will proceed with urgent cystourethroscopy, (L) RPG, stone extraction, and possible double J stent placement by Dr. Karsten Ro (on-call) this afternoon. Risks and benefits discussed with both [name] and family member of general anesthesia, hematuria, infection, and possible injury to bladder or ureter. All questions answered to [name]'s satisfaction and wishes to proceed.  AU CT-STONE PROTOCOL; Status:Resulted - Requires Verification;  Done:  [id number] 12:00AM Due:15Apr2017; Marked Important;Ordered; Today;  [id number] of left ureter, Hydronephrosis with obstructing calculus; Ordered [id number], Amalia Edge[place];   Electronics engineer signed by : Jimmey Ralph, Trey Paula; Mar 29 2016  4:22PM EST

## 2016-03-29 NOTE — Anesthesia Preprocedure Evaluation (Signed)
Anesthesia Evaluation  Patient identified by MRN, date of birth, ID band Patient awake    Reviewed: Allergy & Precautions, NPO status , Patient's Chart, lab work & pertinent test results  Airway Mallampati: II   Neck ROM: full    Dental   Pulmonary shortness of breath, COPD, former smoker,    breath sounds clear to auscultation       Cardiovascular hypertension, + CAD   Rhythm:regular Rate:Normal     Neuro/Psych  Headaches,    GI/Hepatic GERD  ,  Endo/Other  Hypothyroidism   Renal/GU Renal disease     Musculoskeletal  (+) Arthritis ,   Abdominal   Peds  Hematology   Anesthesia Other Findings   Reproductive/Obstetrics                             Anesthesia Physical  Anesthesia Plan  ASA: III  Anesthesia Plan: General   Post-op Pain Management:    Induction: Intravenous  Airway Management Planned: LMA  Additional Equipment:   Intra-op Plan:   Post-operative Plan:   Informed Consent: I have reviewed the patients History and Physical, chart, labs and discussed the procedure including the risks, benefits and alternatives for the proposed anesthesia with the patient or authorized representative who has indicated his/her understanding and acceptance.     Plan Discussed with: CRNA, Anesthesiologist and Surgeon  Anesthesia Plan Comments:         Anesthesia Quick Evaluation  

## 2016-03-29 NOTE — Discharge Instructions (Signed)

## 2016-03-29 NOTE — Anesthesia Procedure Notes (Signed)
Procedure Name: LMA Insertion Date/Time: 03/29/2016 6:08 PM Performed by: Lajuana Carry E Pre-anesthesia Checklist: Patient identified, Emergency Drugs available, Suction available and Patient being monitored Patient Re-evaluated:Patient Re-evaluated prior to inductionOxygen Delivery Method: Circle system utilized Preoxygenation: Pre-oxygenation with 100% oxygen Intubation Type: IV induction Ventilation: Mask ventilation without difficulty LMA: LMA inserted LMA Size: 4.0 Number of attempts: 1 Placement Confirmation: positive ETCO2 and breath sounds checked- equal and bilateral Tube secured with: Tape Dental Injury: Teeth and Oropharynx as per pre-operative assessment

## 2016-03-29 NOTE — H&P (Signed)
Reason For Visit Seen today as a work-in for left flank/back pain since Feb.   History of Present Illness 82 YO female pt of Dr. Alan Ripper seen today as a work-in for left back/flank pain since Feb.   GU hx:  Last seen 2016 for: OVERACTIVE BLADDER  She has been on antimuscarinic therapy. At her last visit here, she was on Vesicare 5 mg a day. That seemed to help her. In the interim, she has been switched to generic oxybutynin ER, 10 mg a day. Her main complaint is nocturnal frequency-she has to urinate perhaps 3-4 times at night. She does have lower extremity edema. She is not on a diuretic.  LEFT RENAL NEOPLASM  She had a CT scan in February, 2014 for follow-up after parastomal hernia repair. This revealed an 8 mm left renal mass that enhanced with contrast. She was found on a recent CT scan June 2016 to have approximately 2 mm growth of this lesion.  April 2017 Interval hx:   States that this back/flank pain has been present for at least 2 months. Had CT for back pain but states was told nothing was really found. States she has no real relief with PRN Tramadol. Has had periods of "dark" urine. Denies n/v or f/c. Has had some constipation recently and took a laxative but colostomy still not "working well."   Past Medical History Problems  1. History of Arthritis 2. History of Colon Cancer 3. History of esophageal reflux (Z87.19) 4. History of hypercholesterolemia (Z86.39) 5. History of hypertension (Z86.79) 6. History of Rectal Cancer  Surgical History Problems  1. History of Appendectomy 2. History of Cholecystectomy 3. History of Colon Surgery 4. History of Hernia Repair 5. History of Hysterectomy 6. History of Rectal Surgery  Current Meds 1. DilTIAZem HCl SOLN;  Therapy: (Recorded:18Feb2013) to Recorded 2. Furosemide 20 MG Oral Tablet; TAKE 1 TABLET DAILY;  Therapy: [phone] to (Evaluate:21Jul2017)  Requested for: 26Jul2016; Last  Rx:26Jul2016 Ordered 3. Lipitor 10  MG Oral Tablet;  Therapy: (Recorded:18Feb2013) to Recorded 4. Metoprolol Succinate ER 25 MG Oral Tablet Extended Release 24 Hour;  Therapy: (Recorded:18Feb2013) to Recorded 5. Oxybutynin Chloride ER 10 MG Oral Tablet Extended Release 24 Hour; TAKE 1 TABLET  DAILY-pt needs office visit for more refills;  Therapy: 12Sep2014 to (Last Rx:08Sep2015)  Requested for: 08Sep2015 Ordered 6. Synthroid TABS;  Therapy: (Recorded:18Feb2013) to Recorded 7. Tramadol-Acetaminophen 37.5-325 MG Oral Tablet;  Therapy: (Recorded:26Jul2016) to Recorded 8. Ventolin HFA AERS;  Therapy: (Recorded:18Feb2013) to Recorded  Allergies  Sulfa Drugs Influenza (Split PF)   Family History Problems  1. Family history of Cervical Cancer : Sister 2. Family history of Colon Cancer : Brother 3. Family history of Colon Cancer : Brother 4. Family history of Congestive Heart Failure : Brother 5. Family history of Congestive Heart Failure : Father 42. Family history of Congestive Heart Failure : Brother 7. Family history of Death In The Family Father   passed at age 37 from CHF 37. Family history of Death In The Family Mother   passed at age 38 from [place]. Family history of Family Health Status Number Of Children   1 son and 2 daughters (1 deceased) 13. Family history of Kidney Cancer : Daughter 74. Family history of Lymphoma : Mother  Social History Problems  1. Denied: History of Alcohol Use (History) 2. Caffeine Use   2-3 cups per day 3. Former smoker [id number]   smoked 1 1/2 ppd for 50 yearsquit 2006 4. Occupation: Retired  Review  of Systems Genitourinary, constitutional, skin, eye, otolaryngeal, hematologic/lymphatic, cardiovascular, pulmonary, endocrine, musculoskeletal, gastrointestinal, neurological and psychiatric system(s) were reviewed and pertinent findings if present are noted and are otherwise negative.  Gastrointestinal: flank pain, abdominal pain and constipation.    Vitals Vital Signs   Blood Pressure: 118 / 66 Temperature: 97.7 F Heart Rate: 61  Physical Exam Constitutional: Well nourished and well developed . No acute distress.  ENT:. The ears and nose are normal in appearance.  Neck: The appearance of the neck is normal and no neck mass is present.  Pulmonary: No respiratory distress and normal respiratory rhythm and effort.  Abdomen: lower midline, upper midline incision site(s) well healed. The abdomen is rounded. The abdomen is soft and nontender. No masses are palpated. No CVA tenderness. In the left lower quadrant a colostomy was present. No hernias are palpable. No hepatosplenomegaly noted.  Genitourinary: Examination of the external genitalia shows normal female external genitalia and no lesions. The urethra is normal in appearance and not tender. There is no urethral mass. Vaginal exam demonstrates the vaginal epithelium to be poorly estrogenized, but no abnormalities. No cystocele is identified. No rectocele is identified. The cervix is is absent. The uterus is absent. The adnexa are palpably normal. The bladder is normal on palpation, non tender and not distended. The anus is normal on inspection. The perineum is normal on inspection.  Lymphatics: The posterior cervical, anterior cervical, supraclavicular, femoral and inguinal nodes are not enlarged or tender.  Skin: Normal skin turgor, no visible rash and no visible skin lesions.  Neuro/Psych:. Mood and affect are appropriate.     Results/Data Urine  COLOR YELLOW  APPEARANCE CLEAR  SPECIFIC GRAVITY <1.005  pH 6.0  GLUCOSE NEGATIVE  BILIRUBIN NEGATIVE  KETONE NEGATIVE  BLOOD 3+  PROTEIN NEGATIVE  NITRITE NEGATIVE  LEUKOCYTE ESTERASE TRACE  SQUAMOUS EPITHELIAL/HPF 6-10 HPF WBC 6-10 WBC/HPF RBC 3-10 RBC/HPF BACTERIA FEW HPF CRYSTALS NONE SEEN HPF CASTS NONE SEEN LPF Yeast NONE SEEN HPF  The following images/tracing/specimen were independently visualized:  KUB: shows large amount of gas/stool  throughout abdomen. Unable to visualize any stone material. RUS limited left: 11.2 X 1.0 X 5.6 X 6.0 cm. Severe hydronephrosis. Posterior renal cystic area: 1.0. Proximal ureteral dilatation: 1.3 cm. CT urogram shows severe hydronephrosis and dilated ureter to level of 3-4 mm mid ureteral stone. This CT was reviewed by Dr. Jeffie Pollock.  The following clinical lab reports were reviewed:  UA: microscopic hematuria.  The following radiology reports were reviewed: I reviewed CT abd/pelvis from 2/17 which showed a 3 mm mid ureteral calculus with mild obstruction. When this was discussed with pt and family they claim she was never told she had stone. She denies passing stone but has not been straining urine.  PVR: Ultrasound PVR 74.86 jets not seen ml. Selected Results  AU CT-STONE PROTOCOL 13Apr2017 12:00AM Jimmey Ralph  Test Name Result Flag Reference CT-STONE PROTOCOL (Report)   ** RADIOLOGY REPORT BY [facility] **   CLINICAL DATA: Patient with history of microscopic hematuria and left flank pain.  EXAM: CT ABDOMEN AND PELVIS WITHOUT CONTRAST  TECHNIQUE: Multidetector CT imaging of the abdomen and pelvis was performed following the standard protocol without IV contrast.  COMPARISON: CT abdomen pelvis 02/10/2016  FINDINGS: Lower chest: Left lower lobe scarring. No pleural effusion.  Hepatobiliary: Multiple calcified granulomata within the liver.  Pancreas: Unremarkable  Spleen: Multiple calcified granulomata.  Adrenals/Urinary Tract: Adrenal glands are unremarkable. Interval worsening of now severe left hydroureteronephrosis to the  level of the distal left ureter were there is an obstructing 6 mm stone (image 44; series 2). Urinary bladder is unremarkable. Within the superior aspect of the left renal collecting system there is a persistent 1.6 cm partially calcified filling defect (image 18; series 2).  Stomach/Bowel: Left lower quadrant colostomy. No evidence for  bowel obstruction. No free fluid or free intraperitoneal air.  Vascular/Lymphatic: Aneurysmal dilatation of the infrarenal abdominal aorta measuring 3.6 x 3.7 cm. No retroperitoneal lymphadenopathy.  Other: Patient status post abdominoperineal resection with left lower quadrant ostomy. Re- demonstrated prolapse of bowel within the pelvic floor.  Musculoskeletal: Lumbar spine degenerative changes. No aggressive or acute appearing osseous lesions.  IMPRESSION: There is an obstructing 6 mm stone within the distal left ureter resulting in worsening now severe left hydroureteronephrosis.  Partially calcified mass within the superior aspect of the left renal collecting system concerning for the possibility of transitional cell carcinoma.  Unchanged aneurysmal dilatation of the infrarenal abdominal aorta measuring up to 3.7 cm. Recommend followup by ultrasound in 2 years. This recommendation follows ACR consensus guidelines: White Paper of the ACR Incidental Findings Committee II on Vascular Findings. J Am Coll Radiol 2013; 10:789-794. The  Postsurgical changes compatible with abdominoperineal resection with persistent herniated loops of bowel through the pelvic floor.   Electronically Signed  By: Lovey Newcomer M.D.  On: 03/29/2016 16:01  Assessment She has a 6 mm left ureteral stone that has been present and has resulted in worsening left hydronephrosis and pain.  In addition she has a filling defect in the upper pole of her left kidney.  I therefore have discussed proceeding with cystoscopy left retrograde pyelogram and laser lithotripsy with stent and if possible I told her I would try to visualize and try to obtain tissue from the upper pole renal mass for pathologic diagnosis.  Plan   Ketorolac 30 mg IM today    Will proceed with urgent cystourethroscopy, (L) RPG, stone extraction, and possible double J stent placement by Dr. Karsten Ro (on-call) this afternoon. Risks and benefits  discussed with both pt and family member of general anesthesia, hematuria, infection, and possible injury to bladder or ureter. All questions answered to pt's satisfaction and wishes to proceed.

## 2016-03-29 NOTE — Anesthesia Postprocedure Evaluation (Signed)
Anesthesia Post Note  Patient: Sherry [name]  Procedure(s) Performed: Procedure(s) (LRB): CYSTOSCOPY WITH RETROGRADE PYELOGRAM, URETEROSCOPY AND STENT PLACEMENT (Left)  Patient location during evaluation: PACU Anesthesia Type: General Level of consciousness: awake and alert and patient cooperative Pain management: pain level controlled Vital Signs Assessment: post-procedure vital signs reviewed and stable Respiratory status: spontaneous breathing and respiratory function stable Cardiovascular status: stable Anesthetic complications: no    Last Vitals:  Filed Vitals:   03/29/16 1930 03/29/16 2020  BP: 159/133 154/78  Pulse: 51   Temp:  36.4 C  Resp: 17     Last Pain:  Filed Vitals:   03/29/16 2030  PainSc: 0-No pain                 Thandiwe Siragusa S

## 2016-03-29 NOTE — Transfer of Care (Signed)
Immediate Anesthesia Transfer of Care Note  Patient: Sherry [name]  Procedure(s) Performed: Procedure(s): CYSTOSCOPY WITH RETROGRADE PYELOGRAM, URETEROSCOPY AND STENT PLACEMENT (Left)  Patient Location: PACU  Anesthesia Type:General  Level of Consciousness:  sedated, patient cooperative and responds to stimulation  Airway & Oxygen Therapy:Patient Spontanous Breathing and Patient connected to face mask oxgen  Post-op Assessment:  Report given to PACU RN and Post -op Vital signs reviewed and stable  Post vital signs:  Reviewed and stable  Last Vitals:  Filed Vitals:   03/29/16 1649 03/29/16 1910  BP: 129/82   Pulse: 64 61  Temp: 36.4 C 36.4 C  Resp: 16 13    Complications: No apparent anesthesia complications

## 2016-03-30 ENCOUNTER — Encounter (HOSPITAL_COMMUNITY): Payer: Self-pay | Admitting: Urology

## 2016-04-17 ENCOUNTER — Ambulatory Visit (INDEPENDENT_AMBULATORY_CARE_PROVIDER_SITE_OTHER): Payer: Medicare Other | Admitting: Urology

## 2016-04-17 DIAGNOSIS — N201 Calculus of ureter: Secondary | ICD-10-CM | POA: Diagnosis not present

## 2016-04-17 DIAGNOSIS — D4100 Neoplasm of uncertain behavior of unspecified kidney: Secondary | ICD-10-CM | POA: Diagnosis not present

## 2016-05-17 ENCOUNTER — Other Ambulatory Visit: Payer: Self-pay | Admitting: Internal Medicine

## 2016-05-19 ENCOUNTER — Other Ambulatory Visit: Payer: Self-pay | Admitting: Internal Medicine

## 2016-06-26 ENCOUNTER — Other Ambulatory Visit (HOSPITAL_COMMUNITY)
Admission: RE | Admit: 2016-06-26 | Discharge: 2016-06-26 | Disposition: A | Discharge status: Home / Self Care | Payer: Medicare Other | Source: Ambulatory Visit | Attending: Urology | Admitting: Urology

## 2016-06-26 ENCOUNTER — Ambulatory Visit (INDEPENDENT_AMBULATORY_CARE_PROVIDER_SITE_OTHER): Payer: Medicare Other | Admitting: Urology

## 2016-06-26 DIAGNOSIS — D4100 Neoplasm of uncertain behavior of unspecified kidney: Secondary | ICD-10-CM | POA: Diagnosis not present

## 2016-06-26 DIAGNOSIS — N200 Calculus of kidney: Secondary | ICD-10-CM | POA: Diagnosis not present

## 2016-06-26 LAB — URINALYSIS, ROUTINE W REFLEX MICROSCOPIC
Bilirubin Urine: NEGATIVE
Glucose, UA: NEGATIVE mg/dL
KETONES UR: NEGATIVE mg/dL
Nitrite: NEGATIVE
PH: 6 (ref 5.0–8.0)
Specific Gravity, Urine: 1.025 (ref 1.005–1.030)

## 2016-06-26 LAB — URINE MICROSCOPIC-ADD ON

## 2016-08-13 ENCOUNTER — Encounter: Payer: Self-pay | Admitting: Internal Medicine

## 2016-08-13 ENCOUNTER — Ambulatory Visit (INDEPENDENT_AMBULATORY_CARE_PROVIDER_SITE_OTHER): Payer: Medicare Other | Admitting: Internal Medicine

## 2016-08-13 VITALS — BP 120/74 | HR 66 | Temp 98.6°F | Resp 18 | Ht 62.0 in | Wt 129.5 lb

## 2016-08-13 DIAGNOSIS — D509 Iron deficiency anemia, unspecified: Secondary | ICD-10-CM | POA: Diagnosis not present

## 2016-08-13 DIAGNOSIS — I10 Essential (primary) hypertension: Secondary | ICD-10-CM | POA: Diagnosis not present

## 2016-08-13 DIAGNOSIS — N289 Disorder of kidney and ureter, unspecified: Secondary | ICD-10-CM | POA: Diagnosis not present

## 2016-08-13 DIAGNOSIS — E038 Other specified hypothyroidism: Secondary | ICD-10-CM

## 2016-08-13 DIAGNOSIS — E034 Atrophy of thyroid (acquired): Secondary | ICD-10-CM

## 2016-08-13 NOTE — Progress Notes (Signed)
Pre visit review using our clinic review tool, if applicable. No additional management support is needed unless otherwise documented below in the visit note. 

## 2016-08-13 NOTE — Progress Notes (Signed)
Subjective:    Patient ID: Sherry [name], female    DOB: 10-01-1923, 82 y.o.   MRN: [id number]  HPI   82 year old patient who is seen today for her six-month follow-up. She has a prior history of colon cancer. She is status post colostomy and has done well.  She has essential hypertension which has been controlled on triple therapy. She has hypothyroidism. In general doing quite well.  No major concerns or complaints.  She does have some occasional pedal edema controlled with furosemide.  Her pulmonary status has been stable.  She states that there has been a mild weight loss of a proximally, 5 pounds. She is followed closely by urology due to a renal mass that is felt to be cancer.  Follow-up ultrasound is scheduled in October  Wt Readings from Last 3 Encounters:  08/13/16 129 lb 8 oz (58.7 kg)  03/29/16 135 lb 9.6 oz (61.5 kg)  02/13/16 138 lb (62.6 kg)    Past Medical History:  Diagnosis Date  . Anemia   . Arthritis   . Bronchitis   . CAD (coronary artery disease)   . COPD (chronic obstructive pulmonary disease) ([place])   . Emphysema of lung ([place])   . GERD (gastroesophageal reflux disease)   . Headache(784.0)   . History of blood transfusion   . Hyperlipidemia   . Hypertension   . Hypothyroidism   . Osteoporosis   . Rectal adenocarcinoma ([place]) dx'd 08/2009  . Rectal cancer ([place])   . Scoliosis    per Dr Melony Overly ortho  . Shingles   . Shortness of breath   . Tendon adhesions    torn tendon right shoulder     Social History   Social History  . Marital status: Married    Spouse name: N/A  . Number of children: 3  . Years of education: N/A   Occupational History  . Not on file.   Social History Main Topics  . Smoking status: Former Smoker    Years: 65.00    Quit date: 12/18/2003  . Smokeless tobacco: Never Used     Comment: quit 2005  . Alcohol use No  . Drug use: No  . Sexual activity: No   Other Topics Concern  . Not on file   Social History  Narrative  . No narrative on file    Past Surgical History:  Procedure Laterality Date  . APPENDECTOMY    . CARDIAC CATHETERIZATION    . CHOLECYSTECTOMY    . COLON REMOVAL     8 " 12/2009 DR TSUEI  . COLON SURGERY     apr for rectal cancer 2011  . COLONOSCOPY    . COLOSTOMY    . CYSTOSCOPY WITH RETROGRADE PYELOGRAM, URETEROSCOPY AND STENT PLACEMENT Left 03/29/2016   Procedure: CYSTOSCOPY WITH RETROGRADE PYELOGRAM, URETEROSCOPY AND STENT PLACEMENT;  Surgeon: Kathie Rhodes, MD;  Location: [facility];  Service: Urology;  Laterality: Left;  With Laser  . EYE SURGERY     Cataract with lens- bil  . OOPHORECTOMY    . ovary tumor removal     after son was born  . PARASTOMAL HERNIA REPAIR  10/30/2012  . POLYPECTOMY    . RECTUM REMOVAL     1/11 DR TSUEI  . TUBAL LIGATION    . VAGINAL HYSTERECTOMY    . VENTRAL HERNIA REPAIR  10/30/2012   Procedure: LAPAROSCOPIC VENTRAL HERNIA;  Surgeon: Imogene Burn. Georgette Dover, MD;  Location: [place];  Service: General;  Laterality: N/A;  Laparoscopic repair of parastomal hernia    Family History  Problem Relation Age of Onset  . Heart failure Father   . Cancer Mother     HEAD AND NECK  . Heart failure Sister   . Cervical cancer Sister   . Heart failure Brother   . Colon cancer Brother   . Colon cancer Brother   . Cervical cancer Daughter   . Kidney cancer Daughter     Allergies  Allergen Reactions  . Percocet [Oxycodone-Acetaminophen] Other (See Comments)    Upsets stomach  . Influenza Virus Vacc Split Pf Other (See Comments)    Reaction unknown  . Sulfamethoxazole-Trimethoprim Other (See Comments)    Reaction unknown    Current Outpatient Prescriptions on File Prior to Visit  Medication Sig Dispense Refill  . atorvastatin (LIPITOR) 10 MG tablet TAKE 1 TABLET DAILY 90 tablet 2  . diltiazem (TIAZAC) 180 MG 24 hr capsule TAKE 1 CAPSULE DAILY 90 capsule 3  . furosemide (LASIX) 20 MG tablet Take 10 mg by mouth daily.    Marland Kitchen HYDROcodone-acetaminophen  (NORCO) 7.5-325 MG tablet Take 1-2 tablets by mouth every 4 (four) hours as needed for moderate pain. Maximum dose per 24 hours - 8 pills 30 tablet 0  . ibuprofen (ADVIL,MOTRIN) 200 MG tablet Take 200 mg by mouth every 6 (six) hours as needed.    Marland Kitchen levothyroxine (SYNTHROID, LEVOTHROID) 75 MCG tablet TAKE 1 TABLET DAILY 90 tablet 2  . meclizine (ANTIVERT) 25 MG tablet Take 1 tablet (25 mg total) by mouth 3 (three) times daily as needed for dizziness. 30 tablet 1  . nystatin (MYCOSTATIN) 100000 UNIT/ML suspension Take 5 mLs (500,000 Units total) by mouth every 4 (four) hours as needed. Swish and spit 300 mL 1  . oxybutynin (DITROPAN-XL) 10 MG 24 hr tablet TAKE 1 TABLET DAILY 90 tablet 2  . phenazopyridine (PYRIDIUM) 200 MG tablet Take 1 tablet (200 mg total) by mouth 3 (three) times daily as needed for pain. 30 tablet 0  . PROAIR HFA 108 (90 Base) MCG/ACT inhaler USE 2 INHALATIONS INTO THE LUNGS EVERY 4 HOURS AS NEEDED FOR SHORTNESS OF BREATH 25.5 g 5  . TOPROL XL 25 MG 24 hr tablet TAKE ONE-HALF (1/2) TABLET (12.5 MG TOTAL) DAILY 90 tablet 1  . traMADol-acetaminophen (ULTRACET) 37.5-325 MG per tablet Take 1 tablet by mouth every 6 (six) hours as needed for moderate pain. 60 tablet 5   No current facility-administered medications on file prior to visit.     BP 120/74 (BP Location: Left Arm, Patient Position: Sitting, Cuff Size: Normal)   Pulse 66   Temp 98.6 F (37 C) (Oral)   Resp 18   Ht 5\' 2"  (1.575 m)   Wt 129 lb 8 oz (58.7 kg)   SpO2 98%   BMI 23.69 kg/m     Review of Systems  Constitutional: Positive for unexpected weight change.  HENT: Negative for congestion, dental problem, hearing loss, rhinorrhea, sinus pressure, sore throat and tinnitus.   Eyes: Negative for pain, discharge and visual disturbance.  Respiratory: Negative for cough and shortness of breath.   Cardiovascular: Negative for chest pain, palpitations and leg swelling.  Gastrointestinal: Negative for abdominal  distention, abdominal pain, blood in stool, constipation, diarrhea, nausea and vomiting.  Genitourinary: Negative for difficulty urinating, dysuria, flank pain, frequency, hematuria, pelvic pain, urgency, vaginal bleeding, vaginal discharge and vaginal pain.  Musculoskeletal: Negative for arthralgias, gait problem and joint swelling.  Skin: Negative for rash.  Neurological: Negative  for dizziness, syncope, speech difficulty, weakness, numbness and headaches.  Hematological: Negative for adenopathy.  Psychiatric/Behavioral: Negative for agitation, behavioral problems and dysphoric mood. The patient is not nervous/anxious.        Objective:   Physical Exam  Constitutional: She is oriented to person, place, and time. She appears well-developed and well-nourished.  HENT:  Head: Normocephalic.  Right Ear: External ear normal.  Left Ear: External ear normal.  Mouth/Throat: Oropharynx is clear and moist.  Eyes: Conjunctivae and EOM are normal. Pupils are equal, round, and reactive to light.  Neck: Normal range of motion. Neck supple. No thyromegaly present.  Cardiovascular: Normal rate, regular rhythm, normal heart sounds and intact distal pulses.   Pulmonary/Chest: Effort normal and breath sounds normal.  Abdominal: Soft. Bowel sounds are normal. She exhibits no mass. There is no tenderness.  Colostomy left lower quadrant  Musculoskeletal: Normal range of motion.  Lymphadenopathy:    She has no cervical adenopathy.  Neurological: She is alert and oriented to person, place, and time.  Skin: Skin is warm and dry. No rash noted.  Psychiatric: She has a normal mood and affect. Her behavior is normal.          Assessment & Plan:     essential hypertension, stable .  Remote colon cancer, status post colostomy , hypothyroidism , history of anemia  history modest weight loss Suspected renal carcinoma.  Follow-up urology  .  No change in medical regimen  CPX 6  months  Nyoka Cowden, MD

## 2016-08-13 NOTE — Patient Instructions (Signed)
.    Flu shot in October  Limit your sodium (Salt) intake  Return in 6 months for follow-up

## 2016-09-05 ENCOUNTER — Other Ambulatory Visit: Payer: Self-pay | Admitting: Urology

## 2016-09-05 DIAGNOSIS — D4100 Neoplasm of uncertain behavior of unspecified kidney: Secondary | ICD-10-CM

## 2016-09-07 ENCOUNTER — Other Ambulatory Visit: Payer: Self-pay | Admitting: Urology

## 2016-09-07 DIAGNOSIS — C659 Malignant neoplasm of unspecified renal pelvis: Secondary | ICD-10-CM

## 2016-09-07 DIAGNOSIS — D49519 Neoplasm of unspecified behavior of unspecified kidney: Secondary | ICD-10-CM

## 2016-09-11 ENCOUNTER — Other Ambulatory Visit: Payer: Self-pay | Admitting: Urology

## 2016-09-11 DIAGNOSIS — C659 Malignant neoplasm of unspecified renal pelvis: Secondary | ICD-10-CM

## 2016-09-14 ENCOUNTER — Ambulatory Visit (HOSPITAL_COMMUNITY)
Admission: RE | Admit: 2016-09-14 | Discharge: 2016-09-14 | Disposition: A | Discharge status: Home / Self Care | Payer: Medicare Other | Source: Ambulatory Visit | Attending: Urology | Admitting: Urology

## 2016-09-14 DIAGNOSIS — D49519 Neoplasm of unspecified behavior of unspecified kidney: Secondary | ICD-10-CM | POA: Insufficient documentation

## 2016-09-14 DIAGNOSIS — I714 Abdominal aortic aneurysm, without rupture: Secondary | ICD-10-CM | POA: Insufficient documentation

## 2016-09-14 DIAGNOSIS — C659 Malignant neoplasm of unspecified renal pelvis: Secondary | ICD-10-CM | POA: Diagnosis not present

## 2016-09-14 DIAGNOSIS — C652 Malignant neoplasm of left renal pelvis: Secondary | ICD-10-CM | POA: Diagnosis not present

## 2016-09-14 LAB — POCT I-STAT CREATININE: Creatinine, Ser: 0.8 mg/dL (ref 0.44–1.00)

## 2016-09-14 MED ORDER — IOPAMIDOL (ISOVUE-300) INJECTION 61%
100.0000 mL | Freq: Once | INTRAVENOUS | Status: AC | PRN
  Administered 2016-09-14: 100 mL via INTRAVENOUS

## 2016-09-25 ENCOUNTER — Ambulatory Visit (INDEPENDENT_AMBULATORY_CARE_PROVIDER_SITE_OTHER): Payer: Medicare Other | Admitting: Urology

## 2016-09-25 ENCOUNTER — Ambulatory Visit (HOSPITAL_COMMUNITY): Payer: Medicare Other

## 2016-09-25 DIAGNOSIS — C642 Malignant neoplasm of left kidney, except renal pelvis: Secondary | ICD-10-CM | POA: Diagnosis not present

## 2016-10-12 DIAGNOSIS — M7551 Bursitis of right shoulder: Secondary | ICD-10-CM | POA: Diagnosis not present

## 2016-10-12 DIAGNOSIS — M19011 Primary osteoarthritis, right shoulder: Secondary | ICD-10-CM | POA: Diagnosis not present

## 2016-10-20 DIAGNOSIS — M7551 Bursitis of right shoulder: Secondary | ICD-10-CM | POA: Diagnosis not present

## 2016-10-20 DIAGNOSIS — M19011 Primary osteoarthritis, right shoulder: Secondary | ICD-10-CM | POA: Diagnosis not present

## 2016-10-24 DIAGNOSIS — M19011 Primary osteoarthritis, right shoulder: Secondary | ICD-10-CM | POA: Diagnosis not present

## 2016-10-30 DIAGNOSIS — M75101 Unspecified rotator cuff tear or rupture of right shoulder, not specified as traumatic: Secondary | ICD-10-CM | POA: Diagnosis not present

## 2016-11-03 ENCOUNTER — Encounter (HOSPITAL_COMMUNITY): Payer: Self-pay | Admitting: Emergency Medicine

## 2016-11-03 ENCOUNTER — Ambulatory Visit (HOSPITAL_COMMUNITY)
Admission: EM | Admit: 2016-11-03 | Discharge: 2016-11-03 | Disposition: A | Discharge status: Home / Self Care | Payer: Medicare Other | Attending: Emergency Medicine | Admitting: Emergency Medicine

## 2016-11-03 DIAGNOSIS — Z85048 Personal history of other malignant neoplasm of rectum, rectosigmoid junction, and anus: Secondary | ICD-10-CM | POA: Insufficient documentation

## 2016-11-03 DIAGNOSIS — M1990 Unspecified osteoarthritis, unspecified site: Secondary | ICD-10-CM | POA: Diagnosis not present

## 2016-11-03 DIAGNOSIS — E039 Hypothyroidism, unspecified: Secondary | ICD-10-CM | POA: Insufficient documentation

## 2016-11-03 DIAGNOSIS — Z882 Allergy status to sulfonamides status: Secondary | ICD-10-CM | POA: Insufficient documentation

## 2016-11-03 DIAGNOSIS — J029 Acute pharyngitis, unspecified: Secondary | ICD-10-CM | POA: Insufficient documentation

## 2016-11-03 DIAGNOSIS — E785 Hyperlipidemia, unspecified: Secondary | ICD-10-CM | POA: Insufficient documentation

## 2016-11-03 DIAGNOSIS — J449 Chronic obstructive pulmonary disease, unspecified: Secondary | ICD-10-CM | POA: Insufficient documentation

## 2016-11-03 DIAGNOSIS — Z8249 Family history of ischemic heart disease and other diseases of the circulatory system: Secondary | ICD-10-CM | POA: Diagnosis not present

## 2016-11-03 DIAGNOSIS — K219 Gastro-esophageal reflux disease without esophagitis: Secondary | ICD-10-CM | POA: Diagnosis not present

## 2016-11-03 DIAGNOSIS — I2510 Atherosclerotic heart disease of native coronary artery without angina pectoris: Secondary | ICD-10-CM | POA: Insufficient documentation

## 2016-11-03 DIAGNOSIS — Z79899 Other long term (current) drug therapy: Secondary | ICD-10-CM | POA: Insufficient documentation

## 2016-11-03 DIAGNOSIS — Z885 Allergy status to narcotic agent status: Secondary | ICD-10-CM | POA: Insufficient documentation

## 2016-11-03 DIAGNOSIS — I10 Essential (primary) hypertension: Secondary | ICD-10-CM | POA: Diagnosis not present

## 2016-11-03 DIAGNOSIS — Z87891 Personal history of nicotine dependence: Secondary | ICD-10-CM | POA: Diagnosis not present

## 2016-11-03 LAB — POCT RAPID STREP A: STREPTOCOCCUS, GROUP A SCREEN (DIRECT): NEGATIVE

## 2016-11-03 MED ORDER — AMOXICILLIN-POT CLAVULANATE 875-125 MG PO TABS: 1.0000 | ORAL_TABLET | Freq: Two times a day (BID) | ORAL | 0 refills | Status: AC

## 2016-11-03 NOTE — Discharge Instructions (Signed)
Continue to push fluids and take over the counter medications as directed on the back of the box for symptomatic relief.  ° °

## 2016-11-03 NOTE — ED Triage Notes (Signed)
Here for ST onset 2 weeks... Believes it may be due to prednisone she took for shoulder pain  Sx include: dysphagia  Denies fevers  A&O x4... NAD

## 2016-11-03 NOTE — ED Provider Notes (Signed)
CSN: [id number]     Arrival date & time 11/03/16  1902 History   None    Chief Complaint  Patient presents with  . Sore Throat   (Consider location/radiation/quality/duration/timing/severity/associated sxs/prior Treatment) 82 y.o. female presents with sore throat x 11 days. Patient describes it as persisitnet and worsening in nature with a "dry" mouth. Patient states the sore throat coorelates with a steroid she was given for shoulder pain. Condition is acute in nature. Condition is made better by nothing. Condition is made worse by nothing. Patient denies any relief from OTC mouth rinses prior to there arrival at this facility. Patient denies any fever, congestion or URI symptoms       Past Medical History:  Diagnosis Date  . Anemia   . Arthritis   . Bronchitis   . CAD (coronary artery disease)   . COPD (chronic obstructive pulmonary disease) ([place])   . Emphysema of lung ([place])   . GERD (gastroesophageal reflux disease)   . Headache(784.0)   . History of blood transfusion   . Hyperlipidemia   . Hypertension   . Hypothyroidism   . Osteoporosis   . Rectal adenocarcinoma ([place]) dx'd 08/2009  . Rectal cancer ([place])   . Scoliosis    per Dr Melony Overly ortho  . Shingles   . Shortness of breath   . Tendon adhesions    torn tendon right shoulder   Past Surgical History:  Procedure Laterality Date  . APPENDECTOMY    . CARDIAC CATHETERIZATION    . CHOLECYSTECTOMY    . COLON REMOVAL     8 " 12/2009 DR TSUEI  . COLON SURGERY     apr for rectal cancer 2011  . COLONOSCOPY    . COLOSTOMY    . CYSTOSCOPY WITH RETROGRADE PYELOGRAM, URETEROSCOPY AND STENT PLACEMENT Left 03/29/2016   Procedure: CYSTOSCOPY WITH RETROGRADE PYELOGRAM, URETEROSCOPY AND STENT PLACEMENT;  Surgeon: Kathie Rhodes, MD;  [facility];  Service: Urology;  Laterality: Left;  With Laser  . EYE SURGERY     Cataract with lens- bil  . OOPHORECTOMY    . ovary tumor removal     after son was born  . PARASTOMAL  HERNIA REPAIR  10/30/2012  . POLYPECTOMY    . RECTUM REMOVAL     1/11 DR TSUEI  . TUBAL LIGATION    . VAGINAL HYSTERECTOMY    . VENTRAL HERNIA REPAIR  10/30/2012   Procedure: LAPAROSCOPIC VENTRAL HERNIA;  Surgeon: Imogene Burn. Georgette Dover, MD;  Location: [facility];  Service: General;  Laterality: N/A;  Laparoscopic repair of parastomal hernia   Family History  Problem Relation Age of Onset  . Heart failure Sister   . Cervical cancer Sister   . Heart failure Brother   . Colon cancer Brother   . Colon cancer Brother   . Heart failure Father   . Cancer Mother     HEAD AND NECK  . Cervical cancer Daughter   . Kidney cancer Daughter    Social History  Substance Use Topics  . Smoking status: Former Smoker    Years: 65.00    Quit date: 12/18/2003  . Smokeless tobacco: Never Used     Comment: quit 2005  . Alcohol use No   OB History    No data available     Review of Systems  Constitutional: Negative for chills and fever.  HENT: Positive for sore throat. Negative for ear pain.   Eyes: Negative for pain and visual disturbance.  Respiratory: Negative  for cough and shortness of breath.   Cardiovascular: Negative for chest pain and palpitations.  Gastrointestinal: Negative for abdominal pain and vomiting.  Skin: Negative for color change and rash.  All other systems reviewed and are negative.   Allergies  Percocet [oxycodone-acetaminophen]; Influenza virus vacc split pf; and Sulfamethoxazole-trimethoprim  Home Medications   Prior to Admission medications   Medication Sig Start Date End Date Taking? Authorizing Provider  atorvastatin (LIPITOR) 10 MG tablet TAKE 1 TABLET DAILY 05/22/16  Yes Marletta Lor, MD  diltiazem [facility]) 180 MG 24 hr capsule TAKE 1 CAPSULE DAILY 05/17/16  Yes Marletta Lor, MD  furosemide (LASIX) 20 MG tablet Take 10 mg by mouth daily.   Yes Historical Provider, MD  levothyroxine (SYNTHROID, LEVOTHROID) 75 MCG tablet TAKE 1 TABLET DAILY 05/22/16  Yes Marletta Lor, MD  meclizine (ANTIVERT) 25 MG tablet Take 1 tablet (25 mg total) by mouth 3 (three) times daily as needed for dizziness. 10/26/14  Yes Marletta Lor, MD  oxybutynin (DITROPAN-XL) 10 MG 24 hr tablet TAKE 1 TABLET DAILY 05/22/16  Yes Marletta Lor, MD  PROAIR HFA 108 [id number] Base) MCG/ACT inhaler USE 2 INHALATIONS INTO THE LUNGS EVERY 4 HOURS AS NEEDED FOR SHORTNESS OF BREATH 02/14/16  Yes Marletta Lor, MD  TOPROL XL 25 MG 24 hr tablet TAKE ONE-HALF (1/2) TABLET (12.5 MG TOTAL) DAILY 10/10/15  Yes Marletta Lor, MD  amoxicillin-clavulanate (AUGMENTIN) 875-125 MG tablet Take 1 tablet by mouth every 12 (twelve) hours. 11/03/16 11/08/16  Jacqualine Mau, NP  HYDROcodone-acetaminophen (NORCO) 7.5-325 MG tablet Take 1-2 tablets by mouth every 4 (four) hours as needed for moderate pain. Maximum dose per 24 hours - 8 pills 03/29/16   Kathie Rhodes, MD  ibuprofen (ADVIL,MOTRIN) 200 MG tablet Take 200 mg by mouth every 6 (six) hours as needed.    Historical Provider, MD  nystatin (MYCOSTATIN) 100000 UNIT/ML suspension Take 5 mLs (500,000 Units total) by mouth every 4 (four) hours as needed. Swish and spit 06/22/15   Laurey Morale, MD  phenazopyridine (PYRIDIUM) 200 MG tablet Take 1 tablet (200 mg total) by mouth 3 (three) times daily as needed for pain. 03/29/16   Kathie Rhodes, MD  traMADol-acetaminophen (ULTRACET) 37.5-325 MG per tablet Take 1 tablet by mouth every 6 (six) hours as needed for moderate pain. 03/03/15   Marletta Lor, MD   Meds Ordered and Administered this Visit  Medications - No data to display  BP 154/72 (BP Location: Left Arm)   Pulse 75   Temp 99 F (37.2 C) (Oral)   Resp 18   SpO2 98%  No data found.   Physical Exam  Constitutional: She is oriented to person, place, and time. She appears well-developed and well-nourished.  HENT:  Head: Normocephalic and atraumatic.  Erythema noted to throat  Eyes: Conjunctivae are normal.  Neck: Normal  range of motion.  Pulmonary/Chest: Effort normal.  Neurological: She is alert and oriented to person, place, and time.  Psychiatric: She has a normal mood and affect.  Nursing note and vitals reviewed.   Urgent Care Course   Clinical Course     Procedures (including critical care time)  Labs Review Labs Reviewed  POCT RAPID STREP A    Imaging Review No results found.     MDM   1. Pharyngitis, unspecified etiology       Jacqualine Mau, NP 11/03/16 2027

## 2016-11-05 ENCOUNTER — Other Ambulatory Visit: Payer: Self-pay | Admitting: *Deleted

## 2016-11-05 MED ORDER — TOPROL XL 25 MG PO TB24: ORAL_TABLET | ORAL | 1 refills | Status: DC

## 2016-11-06 LAB — CULTURE, GROUP A STREP (THRC)

## 2017-01-04 ENCOUNTER — Other Ambulatory Visit: Payer: Self-pay | Admitting: Urology

## 2017-01-04 ENCOUNTER — Ambulatory Visit (INDEPENDENT_AMBULATORY_CARE_PROVIDER_SITE_OTHER): Payer: Medicare Other | Admitting: Urology

## 2017-01-04 ENCOUNTER — Other Ambulatory Visit (HOSPITAL_COMMUNITY)
Admission: AD | Admit: 2017-01-04 | Discharge: 2017-01-04 | Disposition: A | Discharge status: Home / Self Care | Payer: Medicare Other | Source: Other Acute Inpatient Hospital | Attending: Urology | Admitting: Urology

## 2017-01-04 ENCOUNTER — Ambulatory Visit (HOSPITAL_COMMUNITY)
Admission: RE | Admit: 2017-01-04 | Discharge: 2017-01-04 | Disposition: A | Discharge status: Home / Self Care | Payer: Medicare Other | Source: Ambulatory Visit | Attending: Urology | Admitting: Urology

## 2017-01-04 DIAGNOSIS — C642 Malignant neoplasm of left kidney, except renal pelvis: Secondary | ICD-10-CM

## 2017-01-04 DIAGNOSIS — I714 Abdominal aortic aneurysm, without rupture: Secondary | ICD-10-CM | POA: Diagnosis not present

## 2017-01-04 DIAGNOSIS — N200 Calculus of kidney: Secondary | ICD-10-CM | POA: Diagnosis not present

## 2017-01-04 DIAGNOSIS — R310 Gross hematuria: Secondary | ICD-10-CM | POA: Insufficient documentation

## 2017-01-04 DIAGNOSIS — N8110 Cystocele, unspecified: Secondary | ICD-10-CM | POA: Insufficient documentation

## 2017-01-04 LAB — URINALYSIS, COMPLETE (UACMP) WITH MICROSCOPIC
BILIRUBIN URINE: NEGATIVE
Glucose, UA: NEGATIVE mg/dL
KETONES UR: NEGATIVE mg/dL
NITRITE: NEGATIVE
PROTEIN: NEGATIVE mg/dL
Specific Gravity, Urine: <1.005 — ABNORMAL LOW (ref 1.005–1.030)
pH: 7 (ref 5.0–8.0)

## 2017-01-06 LAB — URINE CULTURE

## 2017-01-15 ENCOUNTER — Ambulatory Visit (INDEPENDENT_AMBULATORY_CARE_PROVIDER_SITE_OTHER): Payer: Medicare Other | Admitting: Urology

## 2017-01-15 DIAGNOSIS — R310 Gross hematuria: Secondary | ICD-10-CM

## 2017-01-15 DIAGNOSIS — D4100 Neoplasm of uncertain behavior of unspecified kidney: Secondary | ICD-10-CM

## 2017-01-23 ENCOUNTER — Encounter (HOSPITAL_COMMUNITY): Payer: Self-pay | Admitting: *Deleted

## 2017-01-23 ENCOUNTER — Encounter (HOSPITAL_COMMUNITY): Payer: Self-pay

## 2017-01-23 ENCOUNTER — Other Ambulatory Visit: Payer: Self-pay | Admitting: Urology

## 2017-01-23 NOTE — Patient Instructions (Signed)
Sherry [name]  01/23/2017   Your procedure is scheduled on: 01-28-17  Report to [facility]  Entrance take [facility]  elevators to 3rd floor to  [place] at 530AM.  Call this number if you have problems the morning of surgery [phone]   Remember: ONLY 1 PERSON MAY GO WITH YOU TO SHORT STAY TO GET  READY MORNING OF [place].  Do not eat food or drink liquids :After Midnight.     Take these medicines the morning of surgery with A SIP OF WATER: tylenol as needed, dilitiazem(Tiazac), synthroid, inhaler as needed                                You may not have any metal on your body including hair pins and              piercings  Do not wear jewelry, make-up, lotions, powders or perfumes, deodorant             Do not wear nail polish.  Do not shave  48 hours prior to surgery.              Men may shave face and neck.   Do not bring valuables to the hospital. [place].  Contacts, dentures or bridgework may not be worn into surgery.  Leave suitcase in the car. After surgery it may be brought to your room.              Please read over the following fact sheets you were given: _____________________________________________________________________             [facility] - Preparing for Surgery Before surgery, you can play an important role.  Because skin is not sterile, your skin needs to be as free of germs as possible.  You can reduce the number of germs on your skin by washing with CHG (chlorahexidine gluconate) soap before surgery.  CHG is an antiseptic cleaner which kills germs and bonds with the skin to continue killing germs even after washing. Please DO NOT use if you have an allergy to CHG or antibacterial soaps.  If your skin becomes reddened/irritated stop using the CHG and inform your nurse when you arrive at Short Stay. Do not shave (including legs and underarms) for at least 48 hours prior  to the first CHG shower.  You may shave your face/neck. Please follow these instructions carefully:  1.  Shower with CHG Soap the night before surgery and the  morning of Surgery.  2.  If you choose to wash your hair, wash your hair first as usual with your  normal  shampoo.  3.  After you shampoo, rinse your hair and body thoroughly to remove the  shampoo.                           4.  Use CHG as you would any other liquid soap.  You can apply chg directly  to the skin and wash                       Gently with a scrungie or clean washcloth.  5.  Apply the  CHG Soap to your body ONLY FROM THE NECK DOWN.   Do not use on face/ open                           Wound or open sores. Avoid contact with eyes, ears mouth and genitals (private parts).                       Wash face,  Genitals (private parts) with your normal soap.             6.  Wash thoroughly, paying special attention to the area where your surgery  will be performed.  7.  Thoroughly rinse your body with warm water from the neck down.  8.  DO NOT shower/wash with your normal soap after using and rinsing off  the CHG Soap.                9.  Pat yourself dry with a clean towel.            10.  Wear clean pajamas.            11.  Place clean sheets on your bed the night of your first shower and do not  sleep with pets. Day of Surgery : Do not apply any lotions/deodorants the morning of surgery.  Please wear clean clothes to the hospital/surgery center.  FAILURE TO FOLLOW THESE INSTRUCTIONS MAY RESULT IN THE CANCELLATION OF YOUR SURGERY PATIENT SIGNATURE_________________________________  NURSE SIGNATURE__________________________________  ________________________________________________________________________

## 2017-01-23 NOTE — Progress Notes (Signed)
Patient has pre-op appointment on 01-25-17.  Surgery is 01-28-17 . Please have dr Diona Fanti place  Orders in epic for surgery and sign off on them.

## 2017-01-23 NOTE — Progress Notes (Signed)
EKG 03-29-16  Epic LOV dr Burnice Logan 08-13-16 epic

## 2017-01-25 ENCOUNTER — Encounter (HOSPITAL_COMMUNITY): Payer: Self-pay

## 2017-01-25 ENCOUNTER — Encounter (HOSPITAL_COMMUNITY)
Admission: RE | Admit: 2017-01-25 | Discharge: 2017-01-25 | Disposition: A | Discharge status: Home / Self Care | Payer: Medicare Other | Source: Ambulatory Visit | Attending: Urology | Admitting: Urology

## 2017-01-25 DIAGNOSIS — Z01812 Encounter for preprocedural laboratory examination: Secondary | ICD-10-CM | POA: Insufficient documentation

## 2017-01-25 DIAGNOSIS — D49512 Neoplasm of unspecified behavior of left kidney: Secondary | ICD-10-CM | POA: Diagnosis not present

## 2017-01-25 LAB — CBC
HCT: 31.4 % — ABNORMAL LOW (ref 36.0–46.0)
HEMOGLOBIN: 10.3 g/dL — AB (ref 12.0–15.0)
MCH: 31.3 pg (ref 26.0–34.0)
MCHC: 32.8 g/dL (ref 30.0–36.0)
MCV: 95.4 fL (ref 78.0–100.0)
Platelets: 241 10*3/uL (ref 150–400)
RBC: 3.29 MIL/uL — ABNORMAL LOW (ref 3.87–5.11)
RDW: 17 % — ABNORMAL HIGH (ref 11.5–15.5)
WBC: 7.1 10*3/uL (ref 4.0–10.5)

## 2017-01-25 LAB — BASIC METABOLIC PANEL
Anion gap: 6 (ref 5–15)
BUN: 14 mg/dL (ref 6–20)
CHLORIDE: 105 mmol/L (ref 101–111)
CO2: 28 mmol/L (ref 22–32)
CREATININE: 0.8 mg/dL (ref 0.44–1.00)
Calcium: 10.2 mg/dL (ref 8.9–10.3)
GFR calc Af Amer: >60 mL/min (ref >60)
GFR calc non Af Amer: >60 mL/min (ref >60)
Glucose, Bld: 121 mg/dL — ABNORMAL HIGH (ref 65–99)
Potassium: 4.5 mmol/L (ref 3.5–5.1)
Sodium: 139 mmol/L (ref 135–145)

## 2017-01-27 NOTE — Anesthesia Preprocedure Evaluation (Signed)
Anesthesia Evaluation  Patient identified by MRN, date of birth, ID band Patient awake    Reviewed: Allergy & Precautions, NPO status , Patient's Chart, lab work & pertinent test results  Airway Mallampati: II   Neck ROM: full    Dental   Pulmonary shortness of breath, COPD, former smoker,    breath sounds clear to auscultation       Cardiovascular hypertension, + CAD   Rhythm:regular Rate:Normal     Neuro/Psych  Headaches,    GI/Hepatic GERD  ,  Endo/Other  Hypothyroidism   Renal/GU Renal disease     Musculoskeletal  (+) Arthritis ,   Abdominal   Peds  Hematology   Anesthesia Other Findings   Reproductive/Obstetrics                             Anesthesia Physical  Anesthesia Plan  ASA: III  Anesthesia Plan: General   Post-op Pain Management:    Induction: Intravenous  Airway Management Planned: LMA  Additional Equipment:   Intra-op Plan:   Post-operative Plan:   Informed Consent: I have reviewed the patients History and Physical, chart, labs and discussed the procedure including the risks, benefits and alternatives for the proposed anesthesia with the patient or authorized representative who has indicated his/her understanding and acceptance.     Plan Discussed with: CRNA, Anesthesiologist and Surgeon  Anesthesia Plan Comments:         Anesthesia Quick Evaluation

## 2017-01-27 NOTE — H&P (Signed)
H&P  Chief Complaint: Blood in urine/left kidney tumor  History of Present Illness: Sherry [name] is a 83 y.o. 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 She has persistent gross hematuria, and presents for palliative thullium laser ablation of her lesions to decrease the amt of hematuria.  Past Medical History:  Diagnosis Date  . Anemia   . Arthritis   . Bronchitis   . CAD (coronary artery disease)   . COPD (chronic obstructive pulmonary disease) ([place])   . Emphysema of lung ([place])   . GERD (gastroesophageal reflux disease)   . Headache(784.0)   . History of blood transfusion   . Hyperlipidemia   . Hypertension   . Hypothyroidism   . Osteoporosis   . Rectal adenocarcinoma ([place]) dx'd 08/2009  . Rectal cancer ([place])   . Scoliosis    per Dr Melony Overly ortho  . Shingles   . Shortness of breath   . Tendon adhesions    torn tendon right shoulder    Past Surgical History:  Procedure Laterality Date  . APPENDECTOMY    . CARDIAC CATHETERIZATION    . CHOLECYSTECTOMY    . COLON REMOVAL     8 " 12/2009 DR TSUEI  . COLON SURGERY     apr for rectal cancer 2011  . COLONOSCOPY    . COLOSTOMY    . CYSTOSCOPY WITH RETROGRADE PYELOGRAM, URETEROSCOPY AND STENT PLACEMENT Left 03/29/2016   Procedure: CYSTOSCOPY WITH RETROGRADE PYELOGRAM, URETEROSCOPY AND STENT PLACEMENT;  Surgeon: Kathie Rhodes, MD;  Location: [facility];  Service: Urology;  Laterality: Left;  With Laser  . EYE SURGERY     Cataract with lens- bil  . OOPHORECTOMY    . ovary tumor removal     after son was born  . PARASTOMAL HERNIA REPAIR  10/30/2012  . POLYPECTOMY    . RECTUM REMOVAL     1/11 DR TSUEI  . TUBAL LIGATION    . VAGINAL HYSTERECTOMY    . VENTRAL HERNIA REPAIR  10/30/2012   Procedure: LAPAROSCOPIC VENTRAL HERNIA;  Surgeon: Imogene Burn. Georgette Dover, MD;  Location: [place];  Service: General;  Laterality: N/A;  Laparoscopic repair of parastomal hernia    Home Medications:  No  prescriptions prior to admission.    Allergies:  Allergies  Allergen Reactions  . Percocet [Oxycodone-Acetaminophen] Other (See Comments)    Upsets stomach  . Influenza Virus Vacc Split Pf Other (See Comments)    Unknown  But had to be hospitalized   . Sulfamethoxazole-Trimethoprim Nausea And Vomiting and Other (See Comments)    Family History  Problem Relation Age of Onset  . Heart failure Sister   . Cervical cancer Sister   . Heart failure Brother   . Colon cancer Brother   . Colon cancer Brother   . Heart failure Father   . Cancer Mother     HEAD AND NECK  . Cervical cancer Daughter   . Kidney cancer Daughter     Social History:  reports that she quit smoking about 13 years ago. Her smoking use included Cigarettes. She quit after 65.00 years of use. She has never used smokeless tobacco. She reports that she does not drink alcohol or use drugs.  ROS: A complete review of systems was performed.  All systems are negative except for pertinent findings as noted.  Physical Exam:  Vital signs in last 24 hours:   General:  Alert and oriented, No acute distress HEENT: Normocephalic, atraumatic Neck:  No JVD or lymphadenopathy Cardiovascular: Regular rate and rhythm Lungs: Clear bilaterally Abdomen: Soft, nontender, nondistended, no abdominal masses Back: No CVA tenderness Extremities: No edema Neurologic: Grossly intact  Laboratory Data:  No results found for this or any previous visit (from the past 24 hour(s)). No results found for this or any previous visit (from the past 240 hour(s)). Creatinine:  Recent Labs  01/25/17 0940  CREATININE 0.80    Radiologic Imaging: No results found.  Impression/Assessment:  Gross hematuria due to left upper pole urothelial carcinoma  Plan:  Cystoscopy, left RGP, left ureteroscopy, thulium laser management of left upper pole urothelial carcinoma  Jorja Loa 01/27/2017, 8:53 PM  Lillette Boxer. Kazoua Gossen MD

## 2017-01-28 ENCOUNTER — Encounter (HOSPITAL_COMMUNITY): Payer: Self-pay | Admitting: *Deleted

## 2017-01-28 ENCOUNTER — Ambulatory Visit (HOSPITAL_COMMUNITY): Payer: Medicare Other | Admitting: Anesthesiology

## 2017-01-28 ENCOUNTER — Encounter (HOSPITAL_COMMUNITY)
Admission: RE | Disposition: A | Discharge status: Home / Self Care | Payer: Self-pay | Source: Ambulatory Visit | Attending: Urology

## 2017-01-28 ENCOUNTER — Inpatient Hospital Stay (HOSPITAL_COMMUNITY)
Admission: RE | Admit: 2017-01-28 | Discharge: 2017-01-31 | DRG: 657 | Disposition: A | Discharge status: Home / Self Care | Payer: Medicare Other | Source: Ambulatory Visit | Attending: Urology | Admitting: Urology

## 2017-01-28 DIAGNOSIS — C652 Malignant neoplasm of left renal pelvis: Principal | ICD-10-CM | POA: Diagnosis present

## 2017-01-28 DIAGNOSIS — I2510 Atherosclerotic heart disease of native coronary artery without angina pectoris: Secondary | ICD-10-CM | POA: Diagnosis present

## 2017-01-28 DIAGNOSIS — D62 Acute posthemorrhagic anemia: Secondary | ICD-10-CM | POA: Diagnosis not present

## 2017-01-28 DIAGNOSIS — R112 Nausea with vomiting, unspecified: Secondary | ICD-10-CM | POA: Diagnosis not present

## 2017-01-28 DIAGNOSIS — Z85048 Personal history of other malignant neoplasm of rectum, rectosigmoid junction, and anus: Secondary | ICD-10-CM

## 2017-01-28 DIAGNOSIS — E785 Hyperlipidemia, unspecified: Secondary | ICD-10-CM | POA: Diagnosis not present

## 2017-01-28 DIAGNOSIS — Z87891 Personal history of nicotine dependence: Secondary | ICD-10-CM

## 2017-01-28 DIAGNOSIS — R310 Gross hematuria: Secondary | ICD-10-CM | POA: Diagnosis present

## 2017-01-28 DIAGNOSIS — I10 Essential (primary) hypertension: Secondary | ICD-10-CM | POA: Diagnosis present

## 2017-01-28 DIAGNOSIS — J439 Emphysema, unspecified: Secondary | ICD-10-CM | POA: Diagnosis not present

## 2017-01-28 DIAGNOSIS — D649 Anemia, unspecified: Secondary | ICD-10-CM | POA: Diagnosis not present

## 2017-01-28 DIAGNOSIS — E039 Hypothyroidism, unspecified: Secondary | ICD-10-CM | POA: Diagnosis not present

## 2017-01-28 DIAGNOSIS — D49512 Neoplasm of unspecified behavior of left kidney: Secondary | ICD-10-CM | POA: Diagnosis not present

## 2017-01-28 HISTORY — PX: CYSTOSCOPY/RETROGRADE/URETEROSCOPY: SHX5316

## 2017-01-28 SURGERY — CYSTOSCOPY/RETROGRADE/URETEROSCOPY
Anesthesia: General | Laterality: Left

## 2017-01-28 MED ORDER — CEFAZOLIN SODIUM-DEXTROSE 2-4 GM/100ML-% IV SOLN
2.0000 g | INTRAVENOUS | Status: AC
  Administered 2017-01-28: 2 g via INTRAVENOUS

## 2017-01-28 MED ORDER — SODIUM CHLORIDE 0.45 % IV SOLN
INTRAVENOUS | Status: DC
  Administered 2017-01-28 – 2017-01-29 (×2): via INTRAVENOUS

## 2017-01-28 MED ORDER — ONDANSETRON HCL 4 MG/2ML IJ SOLN
INTRAMUSCULAR | Status: DC | PRN
  Administered 2017-01-28: 4 mg via INTRAVENOUS

## 2017-01-28 MED ORDER — FENTANYL CITRATE (PF) 100 MCG/2ML IJ SOLN
25.0000 ug | INTRAMUSCULAR | Status: DC | PRN
  Administered 2017-01-28 (×2): 25 ug via INTRAVENOUS
  Administered 2017-01-28: 50 ug via INTRAVENOUS
  Administered 2017-01-28: 25 ug via INTRAVENOUS

## 2017-01-28 MED ORDER — DILTIAZEM HCL ER BEADS 180 MG PO CP24
180.0000 mg | ORAL_CAPSULE | Freq: Every day | ORAL | Status: DC
  Filled 2017-01-28 (×2): qty 1

## 2017-01-28 MED ORDER — ONDANSETRON HCL 4 MG/2ML IJ SOLN
INTRAMUSCULAR | Status: AC
  Filled 2017-01-28: qty 2

## 2017-01-28 MED ORDER — LIDOCAINE 2% (20 MG/ML) 5 ML SYRINGE
INTRAMUSCULAR | Status: AC
  Filled 2017-01-28: qty 5

## 2017-01-28 MED ORDER — 0.9 % SODIUM CHLORIDE (POUR BTL) OPTIME
TOPICAL | Status: DC | PRN
  Administered 2017-01-28: 1000 mL

## 2017-01-28 MED ORDER — PROPOFOL 10 MG/ML IV BOLUS
INTRAVENOUS | Status: DC | PRN
  Administered 2017-01-28: 100 mg via INTRAVENOUS

## 2017-01-28 MED ORDER — ACETAMINOPHEN 325 MG PO TABS
650.0000 mg | ORAL_TABLET | ORAL | Status: DC | PRN
  Administered 2017-01-30: 650 mg via ORAL
  Filled 2017-01-28: qty 2

## 2017-01-28 MED ORDER — CEPHALEXIN 250 MG PO CAPS
250.0000 mg | ORAL_CAPSULE | Freq: Two times a day (BID) | ORAL | Status: DC
  Administered 2017-01-28 – 2017-01-31 (×6): 250 mg via ORAL
  Filled 2017-01-28 (×6): qty 1

## 2017-01-28 MED ORDER — FENTANYL CITRATE (PF) 100 MCG/2ML IJ SOLN
INTRAMUSCULAR | Status: AC
  Filled 2017-01-28: qty 2

## 2017-01-28 MED ORDER — LIDOCAINE HCL 1 % IJ SOLN
INTRAMUSCULAR | Status: DC | PRN
  Administered 2017-01-28: 60 mg via INTRADERMAL

## 2017-01-28 MED ORDER — PROPOFOL 10 MG/ML IV BOLUS
INTRAVENOUS | Status: AC
  Filled 2017-01-28: qty 20

## 2017-01-28 MED ORDER — SENNA 8.6 MG PO TABS
1.0000 | ORAL_TABLET | Freq: Two times a day (BID) | ORAL | Status: DC
  Administered 2017-01-28 – 2017-01-31 (×6): 8.6 mg via ORAL
  Filled 2017-01-28 (×6): qty 1

## 2017-01-28 MED ORDER — DEXAMETHASONE SODIUM PHOSPHATE 10 MG/ML IJ SOLN
INTRAMUSCULAR | Status: AC
  Filled 2017-01-28: qty 1

## 2017-01-28 MED ORDER — ALBUTEROL SULFATE (2.5 MG/3ML) 0.083% IN NEBU
2.5000 mg | INHALATION_SOLUTION | RESPIRATORY_TRACT | Status: DC | PRN
Start: 2017-01-28 — End: 2017-01-31

## 2017-01-28 MED ORDER — IOHEXOL 300 MG/ML  SOLN
INTRAMUSCULAR | Status: DC | PRN
  Administered 2017-01-28: 10 mL

## 2017-01-28 MED ORDER — ATORVASTATIN CALCIUM 10 MG PO TABS
10.0000 mg | ORAL_TABLET | Freq: Every evening | ORAL | Status: DC
  Administered 2017-01-28 – 2017-01-30 (×3): 10 mg via ORAL
  Filled 2017-01-28 (×3): qty 1

## 2017-01-28 MED ORDER — CEFAZOLIN SODIUM-DEXTROSE 2-4 GM/100ML-% IV SOLN
INTRAVENOUS | Status: AC
  Filled 2017-01-28: qty 100

## 2017-01-28 MED ORDER — METOPROLOL SUCCINATE ER 25 MG PO TB24
12.5000 mg | ORAL_TABLET | Freq: Every evening | ORAL | Status: DC
  Administered 2017-01-28 – 2017-01-30 (×3): 12.5 mg via ORAL
  Filled 2017-01-28 (×3): qty 1

## 2017-01-28 MED ORDER — ONDANSETRON HCL 4 MG/2ML IJ SOLN: 4.0000 mg | INTRAMUSCULAR | Status: DC | PRN

## 2017-01-28 MED ORDER — ONDANSETRON HCL 4 MG/2ML IJ SOLN: 4.0000 mg | Freq: Once | INTRAMUSCULAR | Status: DC | PRN

## 2017-01-28 MED ORDER — LACTATED RINGERS IV SOLN
INTRAVENOUS | Status: DC | PRN
  Administered 2017-01-28 (×2): via INTRAVENOUS

## 2017-01-28 MED ORDER — MEPERIDINE HCL 50 MG/ML IJ SOLN: 6.2500 mg | INTRAMUSCULAR | Status: DC | PRN

## 2017-01-28 MED ORDER — TRAMADOL HCL 50 MG PO TABS
50.0000 mg | ORAL_TABLET | Freq: Four times a day (QID) | ORAL | Status: DC | PRN
  Administered 2017-01-28 – 2017-01-30 (×2): 50 mg via ORAL
  Filled 2017-01-28 (×2): qty 1

## 2017-01-28 MED ORDER — FENTANYL CITRATE (PF) 100 MCG/2ML IJ SOLN
INTRAMUSCULAR | Status: DC | PRN
  Administered 2017-01-28 (×4): 25 ug via INTRAVENOUS

## 2017-01-28 MED ORDER — DEXAMETHASONE SODIUM PHOSPHATE 10 MG/ML IJ SOLN
INTRAMUSCULAR | Status: DC | PRN
  Administered 2017-01-28: 10 mg via INTRAVENOUS

## 2017-01-28 MED ORDER — MECLIZINE HCL 25 MG PO TABS: 25.0000 mg | ORAL_TABLET | Freq: Three times a day (TID) | ORAL | Status: DC | PRN

## 2017-01-28 MED ORDER — ALBUTEROL SULFATE HFA 108 (90 BASE) MCG/ACT IN AERS: 1.0000 | INHALATION_SPRAY | RESPIRATORY_TRACT | Status: DC | PRN

## 2017-01-28 MED ORDER — SODIUM CHLORIDE 0.9 % IR SOLN
Status: DC | PRN
  Administered 2017-01-28: 9000 mL

## 2017-01-28 MED ORDER — FUROSEMIDE 20 MG PO TABS: 20.0000 mg | ORAL_TABLET | Freq: Every day | ORAL | Status: DC | PRN

## 2017-01-28 MED ORDER — OXYBUTYNIN CHLORIDE 5 MG PO TABS
5.0000 mg | ORAL_TABLET | Freq: Three times a day (TID) | ORAL | Status: DC | PRN
  Administered 2017-01-30 (×2): 5 mg via ORAL
  Filled 2017-01-28 (×2): qty 1

## 2017-01-28 SURGICAL SUPPLY — 24 items
BAG URO CATCHER STRL LF (MISCELLANEOUS) ×3 IMPLANT
BASKET LASER NITINOL 1.9FR (BASKET) IMPLANT
BASKET ZERO TIP NITINOL 2.4FR (BASKET) IMPLANT
CATH INTERMIT  6FR 70CM (CATHETERS) IMPLANT
CLOTH BEACON ORANGE TIMEOUT ST (SAFETY) ×3 IMPLANT
FIBER LASER FLEXIVA 365 (UROLOGICAL SUPPLIES) IMPLANT
FIBER LASER TRAC TIP (UROLOGICAL SUPPLIES) IMPLANT
GLOVE BIOGEL M 8.0 STRL (GLOVE) ×3 IMPLANT
GOWN STRL REUS W/ TWL XL LVL3 (GOWN DISPOSABLE) ×1 IMPLANT
GOWN STRL REUS W/TWL LRG LVL3 (GOWN DISPOSABLE) ×6 IMPLANT
GOWN STRL REUS W/TWL XL LVL3 (GOWN DISPOSABLE) ×2
GUIDEWIRE ANG ZIPWIRE 038X150 (WIRE) IMPLANT
GUIDEWIRE STR DUAL SENSOR (WIRE) ×6 IMPLANT
IV NS 1000ML (IV SOLUTION) ×2
IV NS 1000ML BAXH (IV SOLUTION) ×1 IMPLANT
KIT BALLIN UROMAX 15FX10 (LABEL) ×1 IMPLANT
LASER REVOLIX PROCEDURE (MISCELLANEOUS) ×3 IMPLANT
MANIFOLD NEPTUNE II (INSTRUMENTS) ×3 IMPLANT
PACK CYSTO (CUSTOM PROCEDURE TRAY) ×3 IMPLANT
SET HIGH PRES BAL DIL (LABEL) ×2
SHEATH ACCESS URETERAL 38CM (SHEATH) ×3 IMPLANT
STENT CONTOUR 7FRX24X.038 (STENTS) ×3 IMPLANT
TUBING CONNECTING 10 (TUBING) ×2 IMPLANT
TUBING CONNECTING 10' (TUBING) ×1

## 2017-01-28 NOTE — Op Note (Signed)
Preoperative diagnosis: Gross hematuria secondary to left upper calyceal urothelial carcinoma  Postoperative diagnosis: Same  Principal procedure: Cystoscopy, left retrograde ureteropyelogram with fluoroscopic interpretation, left ureteroscopy (rigid and flexible), thulium laser of left upper calyceal urothelial carcinoma, placement of 7 French, 24 centimeter contour double-J stent without tether  Surgeon: Elianis Fischbach  Anesthesia: Gen. with LMA  Complications: None  Estimated blood loss: 200 mL  Drains: 7 [place] by 24 centimeter contour double-J stent without tether graft specimens: None  Indications: 83 year old female with a known urothelial carcinoma of the left upper pole.  The patient underwent cystoscopy, left ureteroscopy for stone extraction and biopsy of her left urothelial carcinoma in late 2017 by Dr. Karsten Ro.  She has had persistent gross hematuria.  It is felt that this is coming from her urothelial carcinoma.  The patient is not a candidate for extirpate of surgery because of her age.  It is recommended that she undergo palliative cauterization/ablation of her upper pole urothelial carcinoma.  I've discussed the procedure, the anesthetic, nature of this, as well , as possible complications with the patient and her family.  They wished to proceed with this.  Findings: The bladder appeared normal.  There were no urothelial lesions.  There was blood coming from the left ureteral orifice on inspection.  Retrograde ureteropyelogram revealed a normal left ureter.  There was a filling defect in the upper pole calyx.  No other filling defects were seen within the pyelocalyceal system which otherwise appeared normal.  Description of procedure: The patient was properly identified and marked in the holding area.  She was taken the operating room where general anesthetic was administered with the LMA.  She was placed in the dorsolithotomy position.  Genitalia and perineum were prepped and draped.   Proper timeout was performed.  A 21 French panendoscope was placed into her bladder.  Inspection was carried out.  This revealed normal urothelium.  Ureteral orifices were normal in their location and configuration.  As previously dictated, there was a bit of blood coming from the left ureteral orifice.  Retrograde ureteropyelogram was performed with a 6 [place] open-ended catheter.  Omnipaque.  The above findings were noted.  Following retrograde ureteropyelogram, a 0.038 inch sensor-tip guidewire was advanced into the left upper pole calyceal system through the open-ended catheter.  The catheter and cystoscope were removed.  I attempted to dilate the ureter with the inner core of a 12/14 French ureteral access sheath.  This was difficult, so then I removed this and dilated the entire ureter with a 10 centimeters, 15 French ureteral balloon.  Following this, I was easily able to admit the entire 12/14 ureteral access catheter.  I then left the guidewire in, and advanced a 6 French dual-lumen rigid ureteroscope.  This easily passed up the ureter, which was free of urothelial lesions.  I easily encounter the upper pole calyx with the papillary tumor.  There was no active bleeding at this point.  I then passed the 270 micron laser fiber.  Using the thulium laser, I ablated.  Most of the tumor.  Ablation started peripherally towards the tip of the papillary lesion.  I then sequentially moved down towards the base of the tumor.  There was papillary material carpeting the calyx.  This was also ablated.  During this, there was a submucosal bleed area.  This took some time, but I was able, with the cautery button on the laser, to stop the bleeding.  Following this, I inspected all of the other calyces.  No papillary lesions were seen within these.  No lesions were seen within the pelvis.  I then went back to the upper pole calyx where the prior tumor was.  I used the ablative  cautery buttons to complete the  resection/cauterization of these tumors.  There was no significant bleeding at this point.  The ureteroscope and laser fiber were removed through the access sheath.  I then readmitted the 0.038 inch sensor-tip guidewire.  Once in place using fluoroscopic guidance, I removed the access catheter.  The cystoscope was then placed, with the guidewire backloaded through this.  I then placed a 7 [place] by 26 4 centimeter contour double-J stent.  The threaded had been removed.  There was no significant bleeding through the stent at this point.  The bladder was drained.  The scope was then removed.  The patient was awakened and taken to the PACU in stable condition.  She tolerated the procedure well.

## 2017-01-28 NOTE — Discharge Instructions (Signed)

## 2017-01-28 NOTE — Transfer of Care (Signed)
Immediate Anesthesia Transfer of Care Note  Patient: Sherry [name]  Procedure(s) Performed: Procedure(s): CYSTOSCOPY/RETROGRADE/URETEROSCOPY (FLEX 6 fr) THULIUM LASER OF TUMOR (Left) THULIUM LASER OF TUMOR (Left)  Patient Location: PACU  Anesthesia Type:General  Level of Consciousness: awake, alert  and oriented  Airway & Oxygen Therapy: Patient connected to face mask oxygen  Post-op Assessment: Report given to RN and Post -op Vital signs reviewed and stable  Post vital signs: Reviewed and stable  Last Vitals:  Vitals:   01/28/17 0550  BP: (!) 152/65  Pulse: 62  Resp: 18  Temp: 36.8 C    Last Pain:  Vitals:   01/28/17 0550  TempSrc: Oral      Patients Stated Pain Goal: 4 ([id number])  Complications: No apparent anesthesia complications

## 2017-01-28 NOTE — Anesthesia Postprocedure Evaluation (Signed)
Anesthesia Post Note  Patient: Sherry [name]  Procedure(s) Performed: Procedure(s) (LRB): CYSTOSCOPY/RETROGRADE/URETEROSCOPY (FLEX 6 fr) THULIUM LASER OF TUMOR (Left) THULIUM LASER OF TUMOR (Left)  Patient location during evaluation: PACU Anesthesia Type: General Level of consciousness: sedated Pain management: satisfactory to patient Vital Signs Assessment: post-procedure vital signs reviewed and stable Respiratory status: spontaneous breathing Cardiovascular status: stable Anesthetic complications: no       Last Vitals:  Vitals:   01/28/17 1028 01/28/17 1355  BP: 129/60 (!) 119/59  Pulse: (!) 58 (!) 57  Resp: 12 14  Temp: 36.4 C 36.4 C    Last Pain:  Vitals:   01/28/17 1524  TempSrc:   PainSc: Asleep                 Kadesia Robel EDWARD

## 2017-01-28 NOTE — Anesthesia Procedure Notes (Signed)
Procedure Name: LMA Insertion Date/Time: 01/28/2017 7:45 AM Performed by: Gaston Islam EVETTE Pre-anesthesia Checklist: Patient identified, Emergency Drugs available, Suction available and Patient being monitored Patient Re-evaluated:Patient Re-evaluated prior to inductionOxygen Delivery Method: Circle system utilized Preoxygenation: Pre-oxygenation with 100% oxygen Intubation Type: IV induction Ventilation: Mask ventilation without difficulty LMA: LMA inserted LMA Size: 4.0 Number of attempts: 1 Airway Equipment and Method: Patient positioned with wedge pillow Placement Confirmation: positive ETCO2,  CO2 detector and breath sounds checked- equal and bilateral Tube secured with: Tape Dental Injury: Teeth and Oropharynx as per pre-operative assessment

## 2017-01-28 NOTE — Progress Notes (Signed)
Chaplain stopped in to visit with the patient while rounding the floor.  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  The patient is upbeat and very pleasant to talk to.  Patient said she was very well taken care of and expressed her desire to stop peeing.  Patient is appreciative of the care she is receiving here at [facility] and expressed thanks for me stopping by.    01/28/17 1435  Clinical Encounter Type  Visited With Patient  Visit Type Initial   Dorna Bloom Resident Pager [phone]

## 2017-01-29 DIAGNOSIS — E785 Hyperlipidemia, unspecified: Secondary | ICD-10-CM | POA: Diagnosis present

## 2017-01-29 DIAGNOSIS — I10 Essential (primary) hypertension: Secondary | ICD-10-CM | POA: Diagnosis present

## 2017-01-29 DIAGNOSIS — C652 Malignant neoplasm of left renal pelvis: Secondary | ICD-10-CM | POA: Diagnosis present

## 2017-01-29 DIAGNOSIS — I2510 Atherosclerotic heart disease of native coronary artery without angina pectoris: Secondary | ICD-10-CM | POA: Diagnosis present

## 2017-01-29 DIAGNOSIS — Z85048 Personal history of other malignant neoplasm of rectum, rectosigmoid junction, and anus: Secondary | ICD-10-CM | POA: Diagnosis not present

## 2017-01-29 DIAGNOSIS — R309 Painful micturition, unspecified: Secondary | ICD-10-CM | POA: Diagnosis not present

## 2017-01-29 DIAGNOSIS — E039 Hypothyroidism, unspecified: Secondary | ICD-10-CM | POA: Diagnosis present

## 2017-01-29 DIAGNOSIS — Z87891 Personal history of nicotine dependence: Secondary | ICD-10-CM | POA: Diagnosis not present

## 2017-01-29 DIAGNOSIS — R710 Precipitous drop in hematocrit: Secondary | ICD-10-CM | POA: Diagnosis not present

## 2017-01-29 DIAGNOSIS — D649 Anemia, unspecified: Secondary | ICD-10-CM | POA: Diagnosis not present

## 2017-01-29 DIAGNOSIS — R1032 Left lower quadrant pain: Secondary | ICD-10-CM | POA: Diagnosis not present

## 2017-01-29 DIAGNOSIS — J439 Emphysema, unspecified: Secondary | ICD-10-CM | POA: Diagnosis present

## 2017-01-29 DIAGNOSIS — R310 Gross hematuria: Secondary | ICD-10-CM | POA: Diagnosis present

## 2017-01-29 DIAGNOSIS — M6281 Muscle weakness (generalized): Secondary | ICD-10-CM | POA: Diagnosis not present

## 2017-01-29 DIAGNOSIS — D62 Acute posthemorrhagic anemia: Secondary | ICD-10-CM | POA: Diagnosis not present

## 2017-01-29 LAB — HEMOGLOBIN AND HEMATOCRIT, BLOOD
HEMATOCRIT: 21.1 % — AB (ref 36.0–46.0)
Hemoglobin: 7 g/dL — ABNORMAL LOW (ref 12.0–15.0)

## 2017-01-29 LAB — PREPARE RBC (CROSSMATCH)

## 2017-01-29 MED ORDER — LEVOTHYROXINE SODIUM 50 MCG PO TABS
75.0000 ug | ORAL_TABLET | Freq: Every day | ORAL | Status: DC
  Administered 2017-01-29 – 2017-01-31 (×3): 75 ug via ORAL
  Filled 2017-01-29 (×3): qty 1

## 2017-01-29 MED ORDER — DILTIAZEM HCL ER COATED BEADS 180 MG PO CP24
180.0000 mg | ORAL_CAPSULE | Freq: Every day | ORAL | Status: DC
  Administered 2017-01-29 – 2017-01-31 (×3): 180 mg via ORAL
  Filled 2017-01-29 (×2): qty 1

## 2017-01-29 MED ORDER — SODIUM CHLORIDE 0.9 % IV SOLN
Freq: Once | INTRAVENOUS | Status: AC
  Administered 2017-01-29: 09:00:00 via INTRAVENOUS

## 2017-01-29 NOTE — Progress Notes (Signed)
1 Day Post-Op  Subjective: Tamesha continues to have gross hematuria with clots and has dropped her Hgb to 7.   She is weak but denies CP or SOB.  She has some left flank pain.  ROS:  Review of Systems  Constitutional: Negative for chills and fever.  Respiratory: Negative for shortness of breath.   Cardiovascular: Negative for chest pain.  Gastrointestinal: Positive for abdominal pain. Negative for nausea and vomiting.  Genitourinary: Positive for dysuria, frequency and hematuria.  Neurological: Positive for weakness.    Anti-infectives: Anti-infectives    Start     Dose/Rate Route Frequency Ordered Stop   01/28/17 2200  cephALEXin (KEFLEX) capsule 250 mg     250 mg Oral Every 12 hours 01/28/17 1036     01/28/17 0536  ceFAZolin (ANCEF) IVPB 2g/100 mL premix     2 g 200 mL/hr over 30 Minutes Intravenous 30 min pre-op 01/28/17 0536 01/28/17 0734      Current Facility-Administered Medications  Medication Dose Route Frequency Provider Last Rate Last Dose  . 0.45 % sodium chloride infusion   Intravenous Continuous Franchot Gallo, MD 50 mL/hr at 01/28/17 1136    . 0.9 %  sodium chloride infusion   Intravenous Once Irine Seal, MD      . acetaminophen (TYLENOL) tablet 650 mg  650 mg Oral Q4H PRN Franchot Gallo, MD      . albuterol (PROVENTIL) (2.5 MG/3ML) 0.083% nebulizer solution 2.5 mg  2.5 mg Nebulization Q4H PRN Franchot Gallo, MD      . atorvastatin (LIPITOR) tablet 10 mg  10 mg Oral QPM Franchot Gallo, MD   10 mg at 01/28/17 1744  . cephALEXin (KEFLEX) capsule 250 mg  250 mg Oral Q12H Franchot Gallo, MD   250 mg at 01/28/17 2127  . diltiazem (TIAZAC) 24 hr capsule 180 mg  180 mg Oral Daily Franchot Gallo, MD      . furosemide (LASIX) tablet 20 mg  20 mg Oral Daily PRN Franchot Gallo, MD      . meclizine (ANTIVERT) tablet 25 mg  25 mg Oral TID PRN Franchot Gallo, MD      . metoprolol succinate (TOPROL-XL) 24 hr tablet 12.5 mg  12.5 mg Oral QPM Franchot Gallo, MD   12.5 mg at 01/28/17 1744  . ondansetron (ZOFRAN) injection 4 mg  4 mg Intravenous Q4H PRN Franchot Gallo, MD      . oxybutynin [facility]) tablet 5 mg  5 mg Oral Q8H PRN Franchot Gallo, MD      . [facility]) tablet 8.6 mg  1 tablet Oral BID Franchot Gallo, MD   8.6 mg at 01/28/17 2127  . traMADol (ULTRAM) tablet 50 mg  50 mg Oral Q6H PRN Franchot Gallo, MD   50 mg at 01/28/17 1424     Objective: Vital signs in last 24 hours: Temp:  [97.3 F (36.3 C)-98.8 F (37.1 C)] 98.4 F (36.9 C) (02/13 0636) Pulse Rate:  [54-81] 81 (02/13 0636) Resp:  [12-24] 18 (02/13 0636) BP: (102-141)/(58-76) 131/70 (02/13 0636) SpO2:  [92 %-100 %] 100 % (02/13 0636)  Intake/Output from previous day: 02/12 0701 - 02/13 0700 In: 2740 [P.O.:720; I.V.:2020] Out: 725 [Urine:650; Stool:50; Blood:25] Intake/Output this shift: No intake/output data recorded.   Physical Exam  Constitutional: She is well-developed, well-nourished, and in no distress.  Cardiovascular: Normal rate and regular rhythm.   Pulmonary/Chest: Effort normal and breath sounds normal. No respiratory distress.  Abdominal: Soft. There is tenderness (left abdomen.).  Lab Results:   Recent Labs  01/29/17 0532  HGB 7.0*  HCT 21.1*   BMET No results for input(s): NA, K, CL, CO2, GLUCOSE, BUN, CREATININE, CALCIUM in the last 72 hours. PT/INR No results for input(s): LABPROT, INR in the last 72 hours. ABG No results for input(s): PHART, HCO3 in the last 72 hours.  Invalid input(s): PCO2, PO2  Studies/Results: No results found.   Assessment and Plan: 1. Hemorrhage from left renal urothelial carcinoma with acute blood loss anemia.   I will T&C and transfuse 1 unit and recheck labs.   If bleeding continues with progressive anemia, IR may need to be consulted for embolization.       LOS: 0 days    Malka So 01/29/2017 [id number] ID: Sherry [name], female   DOB: Jun 23, 1923, 83 y.o.    MRN: [id number]

## 2017-01-29 NOTE — Progress Notes (Addendum)
Patient ID: Sherry [name], female   DOB: 01/25/1923, 83 y.o.   MRN: [id number] She continues to bleeding with clots but vs are stable.   She is s/p 1u PRBC's and an H&H is pending.    BP 129/61   Pulse 64   Temp 98.6 F (37 C) (Oral)   Resp 18   Ht 5\' 3"  (1.6 m)   Wt 56.7 kg (125 lb)   SpO2 98%   BMI 22.14 kg/m   I will check labs in am and keep string of bottles.    I will keep her NPO post MN as if she continues to bleed, she may need IR to consider embolization.

## 2017-01-30 LAB — CBC WITH DIFFERENTIAL/PLATELET
BASOS PCT: 0 %
Basophils Absolute: 0 10*3/uL (ref 0.0–0.1)
EOS ABS: 0 10*3/uL (ref 0.0–0.7)
EOS PCT: 0 %
HCT: 23.5 % — ABNORMAL LOW (ref 36.0–46.0)
Hemoglobin: 7.7 g/dL — ABNORMAL LOW (ref 12.0–15.0)
Lymphocytes Relative: 9 %
Lymphs Abs: 0.7 10*3/uL (ref 0.7–4.0)
MCH: 30 pg (ref 26.0–34.0)
MCHC: 32.8 g/dL (ref 30.0–36.0)
MCV: 91.4 fL (ref 78.0–100.0)
MONO ABS: 0.9 10*3/uL (ref 0.1–1.0)
MONOS PCT: 11 %
Neutro Abs: 6.7 10*3/uL (ref 1.7–7.7)
Neutrophils Relative %: 80 %
Platelets: 232 10*3/uL (ref 150–400)
RBC: 2.57 MIL/uL — ABNORMAL LOW (ref 3.87–5.11)
RDW: 18.1 % — AB (ref 11.5–15.5)
WBC: 8.4 10*3/uL (ref 4.0–10.5)

## 2017-01-30 LAB — TYPE AND SCREEN
BLOOD PRODUCT EXPIRATION DATE: 201802172359
ISSUE DATE / TIME: 201802131204
Unit Type and Rh: 7300

## 2017-01-30 LAB — BASIC METABOLIC PANEL
Anion gap: 5 (ref 5–15)
BUN: 24 mg/dL — ABNORMAL HIGH (ref 6–20)
CALCIUM: 9.7 mg/dL (ref 8.9–10.3)
CO2: 28 mmol/L (ref 22–32)
Chloride: 106 mmol/L (ref 101–111)
Creatinine, Ser: 0.8 mg/dL (ref 0.44–1.00)
GFR calc non Af Amer: >60 mL/min (ref >60)
Glucose, Bld: 94 mg/dL (ref 65–99)
Potassium: 4.1 mmol/L (ref 3.5–5.1)
SODIUM: 139 mmol/L (ref 135–145)

## 2017-01-30 NOTE — Progress Notes (Signed)
Patient ID: Sherry [name], female   DOB: Dec 30, 1922, 83 y.o.   MRN: [id number] 2 Days Post-Op  Subjective: Titanna continues to have some hematuria with passage of clots with an interrupted stream and dysuria.  Her Hgb is 7.7 which is up from 7 yesterday after 1 u PRBC's.  She remains a bit weak.  I don't think she is bleeding enough to consider IR consultation.  ROS:  Review of Systems  Constitutional: Negative for fever.  Respiratory: Negative for shortness of breath.   Cardiovascular: Negative for chest pain.  Gastrointestinal: Positive for abdominal pain.  Genitourinary: Positive for dysuria and hematuria.  Neurological: Positive for weakness.    Anti-infectives: Anti-infectives    Start     Dose/Rate Route Frequency Ordered Stop   01/28/17 2200  cephALEXin (KEFLEX) capsule 250 mg     250 mg Oral Every 12 hours 01/28/17 1036     01/28/17 0536  ceFAZolin (ANCEF) IVPB 2g/100 mL premix     2 g 200 mL/hr over 30 Minutes Intravenous 30 min pre-op 01/28/17 0536 01/28/17 0734      Current Facility-Administered Medications  Medication Dose Route Frequency Provider Last Rate Last Dose  . acetaminophen (TYLENOL) tablet 650 mg  650 mg Oral Q4H PRN Franchot Gallo, MD      . albuterol (PROVENTIL) (2.5 MG/3ML) 0.083% nebulizer solution 2.5 mg  2.5 mg Nebulization Q4H PRN Franchot Gallo, MD      . atorvastatin (LIPITOR) tablet 10 mg  10 mg Oral QPM Franchot Gallo, MD   10 mg at 01/29/17 1643  . cephALEXin (KEFLEX) capsule 250 mg  250 mg Oral Q12H Franchot Gallo, MD   250 mg at 01/29/17 2119  . diltiazem (CARDIZEM CD) 24 hr capsule 180 mg  180 mg Oral Daily Berton Mount, RPH   180 mg at 01/29/17 1123  . furosemide (LASIX) tablet 20 mg  20 mg Oral Daily PRN Franchot Gallo, MD      . levothyroxine (SYNTHROID, LEVOTHROID) tablet 75 mcg  75 mcg Oral QAC breakfast Franchot Gallo, MD   75 mcg at 01/29/17 1642  . meclizine (ANTIVERT) tablet 25 mg  25 mg Oral TID PRN Franchot Gallo, MD      . metoprolol succinate (TOPROL-XL) 24 hr tablet 12.5 mg  12.5 mg Oral QPM Franchot Gallo, MD   12.5 mg at 01/29/17 1642  . ondansetron (ZOFRAN) injection 4 mg  4 mg Intravenous Q4H PRN Franchot Gallo, MD      . oxybutynin [facility]) tablet 5 mg  5 mg Oral Q8H PRN Franchot Gallo, MD      . [facility]) tablet 8.6 mg  1 tablet Oral BID Franchot Gallo, MD   8.6 mg at 01/29/17 2119  . traMADol (ULTRAM) tablet 50 mg  50 mg Oral Q6H PRN Franchot Gallo, MD   50 mg at 01/28/17 1424     Objective: Vital signs in last 24 hours: Temp:  [97.1 F (36.2 C)-98.7 F (37.1 C)] 98.7 F (37.1 C) (02/14 0513) Pulse Rate:  [54-81] 61 (02/14 0513) Resp:  [18-20] 20 (02/14 0513) BP: (113-162)/(56-87) 113/56 (02/14 0513) SpO2:  [96 %-100 %] 96 % (02/14 0513)  Intake/Output from previous day: 02/13 0701 - 02/14 0700 In: [place] [P.O.:1320; I.V.:210; Blood:310] Out: 1900 [Urine:1900] Intake/Output this shift: Total I/O In: 240 [P.O.:240] Out: 600 [Urine:600]   Physical Exam  Constitutional: She is well-developed, well-nourished, and in no distress.  Cardiovascular: Normal rate and regular rhythm.   Pulmonary/Chest: Effort normal  and breath sounds normal.  Abdominal: Soft. She exhibits no mass. There is tenderness (mild SP and LUQ but less than yesterday).  Vitals reviewed.   Lab Results:   Recent Labs  01/29/17 0532 01/30/17 0544  WBC  --  8.4  HGB 7.0* 7.7*  HCT 21.1* 23.5*  PLT  --  232   BMET  Recent Labs  01/30/17 0544  NA 139  K 4.1  CL 106  CO2 28  GLUCOSE 94  BUN 24*  CREATININE 0.80  CALCIUM 9.7   PT/INR No results for input(s): LABPROT, INR in the last 72 hours. ABG No results for input(s): PHART, HCO3 in the last 72 hours.  Invalid input(s): PCO2, PO2  Studies/Results: No results found.  Labs reviewed.  Assessment and Plan: 1. Gross hematuria from left renal urothelial neoplasm persists but her hgb is up to 7.7 post 1u  PRBC's yesterday.   String of bottles doesn't show severe hemorrhage.   I will resume her diet and check an H&H in the morning.         LOS: 1 day    Ronan Duecker J 01/30/2017 [phone]

## 2017-01-31 LAB — HEMOGLOBIN AND HEMATOCRIT, BLOOD
HCT: 23.2 % — ABNORMAL LOW (ref 36.0–46.0)
HEMOGLOBIN: 7.6 g/dL — AB (ref 12.0–15.0)

## 2017-01-31 NOTE — Progress Notes (Signed)
Family here d/c instructions reviewed and all belongings given voices no c/o

## 2017-01-31 NOTE — Progress Notes (Signed)
Ride will be here around voon per pt

## 2017-01-31 NOTE — Progress Notes (Signed)
  Date: January 31, 2017 Discharge orders checked for needs. No case management needs present at time of discharge. Velva Harman, RN, BSN, [place]   [phone]

## 2017-01-31 NOTE — Discharge Summary (Signed)
Patient ID: Sherry [name] MRN: [id number] DOB/AGE: 1923-06-05 83 y.o.  Admit date: 01/28/2017 Discharge date: 01/31/2017  Primary Care Physician:  Nyoka Cowden, MD  Discharge Diagnoses:  Transitional cell carcinoma of left renal pelvis Anemia from hemorrhage Present on Admission: . Hematuria, gross   Consults:  None   Discharge Medications: Allergies as of 01/31/2017      Reactions   Percocet [oxycodone-acetaminophen] Other (See Comments)   Upsets stomach   Influenza Virus Vacc Split Pf Other (See Comments)   Unknown  But had to be hospitalized    Sulfamethoxazole-trimethoprim Nausea And Vomiting, Other (See Comments)      Medication List    STOP taking these medications   cephALEXin 250 MG capsule Commonly known as:  KEFLEX   ibuprofen 200 MG tablet Commonly known as:  ADVIL,MOTRIN   oxybutynin 10 MG 24 hr tablet Commonly known as:  DITROPAN-XL     TAKE these medications   acetaminophen 650 MG CR tablet Commonly known as:  TYLENOL Take 1,300 mg by mouth every 8 (eight) hours as needed for pain.   atorvastatin 10 MG tablet Commonly known as:  LIPITOR TAKE 1 TABLET DAILY What changed:  See the new instructions.   diltiazem 180 MG 24 hr capsule Commonly known as:  TIAZAC TAKE 1 CAPSULE DAILY   furosemide 20 MG tablet Commonly known as:  LASIX Take 20 mg by mouth daily as needed for fluid.   HYDROcodone-acetaminophen 7.5-325 MG tablet Commonly known as:  NORCO Take 1-2 tablets by mouth every 4 (four) hours as needed for moderate pain. Maximum dose per 24 hours - 8 pills What changed:  how much to take  additional instructions   levothyroxine 75 MCG tablet Commonly known as:  SYNTHROID, LEVOTHROID TAKE 1 TABLET DAILY   meclizine 25 MG tablet Commonly known as:  ANTIVERT Take 1 tablet (25 mg total) by mouth 3 (three) times daily as needed for dizziness.   nystatin 100000 UNIT/ML suspension Commonly known as:  MYCOSTATIN Take 5 mLs  (500,000 Units total) by mouth every 4 (four) hours as needed. Swish and spit   phenazopyridine 200 MG tablet Commonly known as:  PYRIDIUM Take 1 tablet (200 mg total) by mouth 3 (three) times daily as needed for pain.   PROAIR HFA 108 (90 Base) MCG/ACT inhaler Generic drug:  albuterol USE 2 INHALATIONS INTO THE LUNGS EVERY 4 HOURS AS NEEDED FOR SHORTNESS OF BREATH   TOPROL XL 25 MG 24 hr tablet Generic drug:  metoprolol succinate TAKE ONE-HALF (1/2) TABLET (12.5 MG TOTAL) DAILY What changed:  how much to take  how to take this  when to take this  additional instructions   traMADol-acetaminophen 37.5-325 MG tablet Commonly known as:  ULTRACET Take 1 tablet by mouth every 6 (six) hours as needed for moderate pain.        Significant Diagnostic Studies:  No results found.  Brief H and P: For complete details please refer to admission H and P, but in brief Sherry [name] is admitted for endoscopic management of bleeding from a papillary transitional cell carcinoma of her left kidney  Hospital Course:  Active Problems:   Hematuria, gross She had significant bleeding from her left calyceal system during her endoscopic procedure which was properly managed with laser cautery and by the end of the procedure had abated. However, it resumed postoperatively and she required 1 u PRBC transfusion postop. Her urine cleared to slightly pink bu POD 2 and her hgb stabilized. She was  d/ced on POD 3.  Day of Discharge BP (!) 103/55 (BP Location: Right Arm)   Pulse 60   Temp 98.2 F (36.8 C) (Oral)   Resp 20   Ht 5\' 3"  (1.6 m)   Wt 56.7 kg (125 lb)   SpO2 96%   BMI 22.14 kg/m   Results for orders placed or performed during the hospital encounter of 01/28/17 (from the past 24 hour(s))  Hemoglobin and hematocrit, blood     Status: Abnormal   Collection Time: 01/31/17  5:03 AM  Result Value Ref Range   Hemoglobin 7.6 (L) 12.0 - 15.0 g/dL   HCT 23.2 (L) 36.0 - 46.0 %    Physical  Exam: General: Alert and awake oriented x3 not in any acute distress. HEENT: anicteric sclera, pupils reactive to light and accommodation CVS: S1-S2 clear no murmur rubs or gallops Chest: clear to auscultation bilaterally, no wheezing rales or rhonchi Abdomen: soft nontender, nondistended, normal bowel sounds, no organomegaly Extremities: no cyanosis, clubbing or edema noted bilaterally Neuro: Cranial nerves II-XII intact, no focal neurological deficits  Disposition:  Home   Diet:  No restrictions  Activity:  Gradually increase   Disposition and Follow-up:    Will followup next week in [place] for srent removal    DISCHARGE FOLLOW-UP Follow-up Information    Ivanna Kocak, Lillette Boxer, MD Follow up.   Specialty:  Urology Why:  we will call you to remove stent Contact information: [address] [place] [address] [phone]           Time spent on Discharge:  15 mins  Signed: Jorja Loa 01/31/2017, 6:39 AM

## 2017-02-05 ENCOUNTER — Other Ambulatory Visit (HOSPITAL_COMMUNITY)
Admission: RE | Admit: 2017-02-05 | Discharge: 2017-02-05 | Disposition: A | Discharge status: Home / Self Care | Payer: Medicare Other | Attending: Urology | Admitting: Urology

## 2017-02-05 ENCOUNTER — Ambulatory Visit (INDEPENDENT_AMBULATORY_CARE_PROVIDER_SITE_OTHER): Payer: Medicare Other | Admitting: Urology

## 2017-02-05 DIAGNOSIS — Z79899 Other long term (current) drug therapy: Secondary | ICD-10-CM | POA: Insufficient documentation

## 2017-02-05 DIAGNOSIS — J449 Chronic obstructive pulmonary disease, unspecified: Secondary | ICD-10-CM | POA: Insufficient documentation

## 2017-02-05 DIAGNOSIS — I2510 Atherosclerotic heart disease of native coronary artery without angina pectoris: Secondary | ICD-10-CM | POA: Diagnosis not present

## 2017-02-05 DIAGNOSIS — Z87891 Personal history of nicotine dependence: Secondary | ICD-10-CM | POA: Diagnosis not present

## 2017-02-05 DIAGNOSIS — I10 Essential (primary) hypertension: Secondary | ICD-10-CM | POA: Diagnosis not present

## 2017-02-05 DIAGNOSIS — G8918 Other acute postprocedural pain: Secondary | ICD-10-CM | POA: Diagnosis not present

## 2017-02-05 DIAGNOSIS — R1032 Left lower quadrant pain: Secondary | ICD-10-CM | POA: Diagnosis not present

## 2017-02-05 DIAGNOSIS — E039 Hypothyroidism, unspecified: Secondary | ICD-10-CM | POA: Diagnosis not present

## 2017-02-05 DIAGNOSIS — R310 Gross hematuria: Secondary | ICD-10-CM

## 2017-02-05 DIAGNOSIS — C642 Malignant neoplasm of left kidney, except renal pelvis: Secondary | ICD-10-CM

## 2017-02-05 DIAGNOSIS — R109 Unspecified abdominal pain: Secondary | ICD-10-CM | POA: Diagnosis present

## 2017-02-05 DIAGNOSIS — C679 Malignant neoplasm of bladder, unspecified: Secondary | ICD-10-CM | POA: Diagnosis not present

## 2017-02-06 ENCOUNTER — Emergency Department (HOSPITAL_COMMUNITY)
Admission: EM | Admit: 2017-02-06 | Discharge: 2017-02-06 | Disposition: A | Discharge status: Home / Self Care | Payer: Medicare Other | Attending: Emergency Medicine | Admitting: Emergency Medicine

## 2017-02-06 ENCOUNTER — Encounter (HOSPITAL_COMMUNITY): Payer: Self-pay

## 2017-02-06 ENCOUNTER — Emergency Department (HOSPITAL_COMMUNITY): Payer: Medicare Other

## 2017-02-06 DIAGNOSIS — C679 Malignant neoplasm of bladder, unspecified: Secondary | ICD-10-CM | POA: Diagnosis not present

## 2017-02-06 DIAGNOSIS — R1032 Left lower quadrant pain: Secondary | ICD-10-CM | POA: Diagnosis not present

## 2017-02-06 DIAGNOSIS — G8918 Other acute postprocedural pain: Secondary | ICD-10-CM

## 2017-02-06 LAB — CBC WITH DIFFERENTIAL/PLATELET
BASOS ABS: 0 10*3/uL (ref 0.0–0.1)
BASOS PCT: 0 %
EOS ABS: 0.1 10*3/uL (ref 0.0–0.7)
Eosinophils Relative: 1 %
HCT: 27.7 % — ABNORMAL LOW (ref 36.0–46.0)
HEMOGLOBIN: 8.7 g/dL — AB (ref 12.0–15.0)
Lymphocytes Relative: 6 %
Lymphs Abs: 0.7 10*3/uL (ref 0.7–4.0)
MCH: 28.6 pg (ref 26.0–34.0)
MCHC: 31.4 g/dL (ref 30.0–36.0)
MCV: 91.1 fL (ref 78.0–100.0)
MONOS PCT: 7 %
Monocytes Absolute: 0.7 10*3/uL (ref 0.1–1.0)
NEUTROS ABS: 9.5 10*3/uL — AB (ref 1.7–7.7)
NEUTROS PCT: 86 %
Platelets: 321 10*3/uL (ref 150–400)
RBC: 3.04 MIL/uL — ABNORMAL LOW (ref 3.87–5.11)
RDW: 16.1 % — ABNORMAL HIGH (ref 11.5–15.5)
WBC: 11 10*3/uL — AB (ref 4.0–10.5)

## 2017-02-06 LAB — COMPREHENSIVE METABOLIC PANEL
ALK PHOS: 81 U/L (ref 38–126)
ALT: 15 U/L (ref 14–54)
AST: 18 U/L (ref 15–41)
Albumin: 3.6 g/dL (ref 3.5–5.0)
Anion gap: 8 (ref 5–15)
BILIRUBIN TOTAL: 0.6 mg/dL (ref 0.3–1.2)
BUN: 12 mg/dL (ref 6–20)
CALCIUM: 10.1 mg/dL (ref 8.9–10.3)
CO2: 24 mmol/L (ref 22–32)
CREATININE: 1.1 mg/dL — AB (ref 0.44–1.00)
Chloride: 103 mmol/L (ref 101–111)
GFR, EST AFRICAN AMERICAN: 49 mL/min — AB (ref >60)
GFR, EST NON AFRICAN AMERICAN: 43 mL/min — AB (ref >60)
Glucose, Bld: 152 mg/dL — ABNORMAL HIGH (ref 65–99)
Potassium: 4.2 mmol/L (ref 3.5–5.1)
Sodium: 135 mmol/L (ref 135–145)
TOTAL PROTEIN: 6.5 g/dL (ref 6.5–8.1)

## 2017-02-06 LAB — URINALYSIS, ROUTINE W REFLEX MICROSCOPIC
Bilirubin Urine: NEGATIVE
GLUCOSE, UA: NEGATIVE mg/dL
Ketones, ur: 5 mg/dL — AB
Leukocytes, UA: NEGATIVE
NITRITE: NEGATIVE
Protein, ur: NEGATIVE mg/dL
SPECIFIC GRAVITY, URINE: 1.006 (ref 1.005–1.030)
Squamous Epithelial / LPF: NONE SEEN
pH: 8 (ref 5.0–8.0)

## 2017-02-06 MED ORDER — IOPAMIDOL (ISOVUE-300) INJECTION 61%
INTRAVENOUS | Status: AC
  Filled 2017-02-06: qty 30

## 2017-02-06 MED ORDER — MORPHINE SULFATE (PF) 4 MG/ML IV SOLN
4.0000 mg | Freq: Once | INTRAVENOUS | Status: AC
Start: 2017-02-06 — End: 2017-02-06
  Administered 2017-02-06: 4 mg via INTRAVENOUS
  Filled 2017-02-06: qty 1

## 2017-02-06 MED ORDER — ONDANSETRON HCL 4 MG/2ML IJ SOLN
4.0000 mg | Freq: Once | INTRAMUSCULAR | Status: AC
  Administered 2017-02-06: 4 mg via INTRAVENOUS
  Filled 2017-02-06: qty 2

## 2017-02-06 MED ORDER — IOPAMIDOL (ISOVUE-300) INJECTION 61%
INTRAVENOUS | Status: AC
  Administered 2017-02-06: 75 mL via INTRAVENOUS
  Filled 2017-02-06: qty 75

## 2017-02-06 NOTE — Discharge Instructions (Signed)
Call your Urologist for an appointment as soon as possible for a recheck. The CT scan did not show any complicating factors or significant changes.

## 2017-02-06 NOTE — ED Triage Notes (Signed)
Pt states she had Laser Kidney surgery with stent placed in Kidney; pt has stent taken out today; pt c/o severe back pain after procedure; pt c/o pain 10/10 on arrival. Pt a&ox 4 on arrival.

## 2017-02-06 NOTE — ED Notes (Signed)
Patient transported to CT 

## 2017-02-06 NOTE — ED Provider Notes (Signed)
[facility] DEPT Provider Note   CSN: [id number] Arrival date & time: 02/05/17  2352     History   Chief Complaint No chief complaint on file.   HPI Sherry [name] is a 83 y.o. female.  Patient with a recent urologic procedure treating transitional cell carcinoma of the left kidney, discharged from the hospital last Thursday (02/01/16, 5 days ago). She was seen yesterday by her urologist and had the ureteral stent removed. She reports the postop hematuria was improving but the flank pain was worsening. Around 3:00 yesterday afternoon the pain was severe. She has nausea and vomiting with the worst pain. No fever. She has a colostomy and reports it continues to fill with stool and she denies abdominal pain. No melena.    The history is provided by the patient and a relative. No language interpreter was used.    Past Medical History:  Diagnosis Date  . Anemia   . Arthritis   . Bronchitis   . CAD (coronary artery disease)   . COPD (chronic obstructive pulmonary disease) ([place])   . Emphysema of lung ([place])   . GERD (gastroesophageal reflux disease)   . Headache(784.0)   . History of blood transfusion   . Hyperlipidemia   . Hypertension   . Hypothyroidism   . Osteoporosis   . Rectal adenocarcinoma ([place]) dx'd 08/2009  . Rectal cancer ([place])   . Scoliosis    per Dr Melony Overly ortho  . Shingles   . Shortness of breath   . Tendon adhesions    torn tendon right shoulder    Patient Active Problem List   Diagnosis Date Noted  . Hematuria, gross 01/28/2017  . Acquired pelvic enterocele 12/28/2013  . Unsteady gait 11/26/2013  . Colostomy care ([place]) 08/21/2013  . Renal lesion 07/26/2013  . Suprapubic pain 02/10/2013  . Skin lesion - left lower quadrant abdominal wall 01/21/2013  . Parastomal hernia without obstruction or gangrene 09/26/2012  . Acute upper respiratory infection 09/28/2010  . PARESTHESIA, HANDS 08/15/2010  . PALPITATIONS, OCCASIONAL 09/23/2009  . NAUSEA AND  VOMITING 09/16/2009  . ADENOCARCINOMA, RECTUM 09/01/2009  . Hypothyroidism 09/01/2009  . Dyslipidemia 09/01/2009  . ANEMIA-IRON DEFICIENCY 09/01/2009  . Essential hypertension 09/01/2009  . COPD 09/01/2009  . OSTEOPOROSIS 09/01/2009  . HEADACHE 09/01/2009    Past Surgical History:  Procedure Laterality Date  . APPENDECTOMY    . CARDIAC CATHETERIZATION    . CHOLECYSTECTOMY    . COLON REMOVAL     8 " 12/2009 DR TSUEI  . COLON SURGERY     apr for rectal cancer 2011  . COLONOSCOPY    . COLOSTOMY    . CYSTOSCOPY WITH RETROGRADE PYELOGRAM, URETEROSCOPY AND STENT PLACEMENT Left 03/29/2016   Procedure: CYSTOSCOPY WITH RETROGRADE PYELOGRAM, URETEROSCOPY AND STENT PLACEMENT;  Surgeon: Kathie Rhodes, MD;  Location: [facility];  Service: Urology;  Laterality: Left;  With Laser  . CYSTOSCOPY/RETROGRADE/URETEROSCOPY Left 01/28/2017   Procedure: CYSTOSCOPY/RETROGRADE/URETEROSCOPY (FLEX 6 fr) THULIUM LASER OF TUMOR;  Surgeon: Franchot Gallo, MD;  Location: [facility];  Service: Urology;  Laterality: Left;  . EYE SURGERY     Cataract with lens- bil  . OOPHORECTOMY    . ovary tumor removal     after son was born  . PARASTOMAL HERNIA REPAIR  10/30/2012  . POLYPECTOMY    . RECTUM REMOVAL     1/11 DR TSUEI  . TUBAL LIGATION    . VAGINAL HYSTERECTOMY    . VENTRAL HERNIA REPAIR  10/30/2012  Procedure: LAPAROSCOPIC VENTRAL HERNIA;  Surgeon: Imogene Burn. Georgette Dover, MD;  Location: [facility];  Service: General;  Laterality: N/A;  Laparoscopic repair of parastomal hernia    OB History    No data available       Home Medications    Prior to Admission medications   Medication Sig Start Date End Date Taking? Authorizing Provider  acetaminophen (TYLENOL) 650 MG CR tablet Take 1,300 mg by mouth every 8 (eight) hours as needed for pain.    Historical Provider, MD  atorvastatin (LIPITOR) 10 MG tablet TAKE 1 TABLET DAILY Patient taking differently: TAKE 1 TABLET EVERY EVENING 05/22/16   Marletta Lor, MD    diltiazem [facility]) 180 MG 24 hr capsule TAKE 1 CAPSULE DAILY 05/17/16   Marletta Lor, MD  furosemide (LASIX) 20 MG tablet Take 20 mg by mouth daily as needed for fluid.     Historical Provider, MD  HYDROcodone-acetaminophen (NORCO) 7.5-325 MG tablet Take 1-2 tablets by mouth every 4 (four) hours as needed for moderate pain. Maximum dose per 24 hours - 8 pills Patient taking differently: Take 1 tablet by mouth every 4 (four) hours as needed for moderate pain. Maximum dose per 24 hours - 8 pills 03/29/16   Kathie Rhodes, MD  levothyroxine (SYNTHROID, LEVOTHROID) 75 MCG tablet TAKE 1 TABLET DAILY 05/22/16   Marletta Lor, MD  meclizine (ANTIVERT) 25 MG tablet Take 1 tablet (25 mg total) by mouth 3 (three) times daily as needed for dizziness. 10/26/14   Marletta Lor, MD  nystatin (MYCOSTATIN) 100000 UNIT/ML suspension Take 5 mLs (500,000 Units total) by mouth every 4 (four) hours as needed. Swish and spit Patient not taking: Reported on 01/23/2017 06/22/15   Laurey Morale, MD  phenazopyridine (PYRIDIUM) 200 MG tablet Take 1 tablet (200 mg total) by mouth 3 (three) times daily as needed for pain. 03/29/16   Kathie Rhodes, MD  PROAIR HFA 108 [phone] Base) MCG/ACT inhaler USE 2 INHALATIONS INTO THE LUNGS EVERY 4 HOURS AS NEEDED FOR SHORTNESS OF BREATH 02/14/16   Marletta Lor, MD  TOPROL XL 25 MG 24 hr tablet TAKE ONE-HALF (1/2) TABLET (12.5 MG TOTAL) DAILY Patient taking differently: Take 12.5 mg by mouth every evening. TAKE ONE-HALF (1/2) TABLET (12.5 MG TOTAL) DAILY 11/05/16   Marletta Lor, MD  traMADol-acetaminophen (ULTRACET) 37.5-325 MG per tablet Take 1 tablet by mouth every 6 (six) hours as needed for moderate pain. 03/03/15   Marletta Lor, MD    Family History Family History  Problem Relation Age of Onset  . Heart failure Sister   . Cervical cancer Sister   . Heart failure Brother   . Colon cancer Brother   . Colon cancer Brother   . Heart failure Father   . Cancer  Mother     HEAD AND NECK  . Cervical cancer Daughter   . Kidney cancer Daughter     Social History Social History  Substance Use Topics  . Smoking status: Former Smoker    Years: 65.00    Types: Cigarettes    Quit date: 12/18/2003  . Smokeless tobacco: Never Used     Comment: quit 2005  . Alcohol use No     Allergies   Percocet [oxycodone-acetaminophen]; Influenza virus vacc split pf; and Sulfamethoxazole-trimethoprim   Review of Systems Review of Systems  Constitutional: Negative for chills and fever.  HENT: Negative.   Respiratory: Negative.   Cardiovascular: Negative.   Gastrointestinal: Positive for nausea and  vomiting. Negative for abdominal pain.  Genitourinary: Positive for flank pain and hematuria.  Skin: Negative.   Neurological: Negative.  Negative for syncope and light-headedness.     Physical Exam Updated Vital Signs BP 166/97 (BP Location: Right Arm)   Pulse 78   Temp 97.5 F (36.4 C) (Oral)   Resp 18   SpO2 98%   Physical Exam  Constitutional: She is oriented to person, place, and time. She appears well-developed and well-nourished.  HENT:  Head: Normocephalic.  Neck: Normal range of motion. Neck supple.  Cardiovascular: Normal rate and regular rhythm.   Pulmonary/Chest: Effort normal and breath sounds normal.  Abdominal: Soft. Bowel sounds are normal. There is no tenderness. There is no rebound and no guarding.  Colostomy in LLQ abdomen. Nontender abdomen.  Genitourinary:  Genitourinary Comments: Left flank tenderness.   Musculoskeletal: Normal range of motion.  Neurological: She is alert and oriented to person, place, and time.  Skin: Skin is warm and dry. No rash noted.  Psychiatric: She has a normal mood and affect.     ED Treatments / Results  Labs (all labs ordered are listed, but only abnormal results are displayed) Labs Reviewed  URINE CULTURE  URINALYSIS, ROUTINE W REFLEX MICROSCOPIC  COMPREHENSIVE METABOLIC PANEL  CBC WITH  DIFFERENTIAL/PLATELET   Results for orders placed or performed during the hospital encounter of 02/06/17  Urinalysis, Routine w reflex microscopic  Result Value Ref Range   Color, Urine STRAW (A) YELLOW   APPearance HAZY (A) CLEAR   Specific Gravity, Urine 1.006 1.005 - 1.030   pH 8.0 5.0 - 8.0   Glucose, UA NEGATIVE NEGATIVE mg/dL   Hgb urine dipstick LARGE (A) NEGATIVE   Bilirubin Urine NEGATIVE NEGATIVE   Ketones, ur 5 (A) NEGATIVE mg/dL   Protein, ur NEGATIVE NEGATIVE mg/dL   Nitrite NEGATIVE NEGATIVE   Leukocytes, UA NEGATIVE NEGATIVE   RBC / HPF TOO NUMEROUS TO COUNT 0 - 5 RBC/hpf   WBC, UA 6-30 0 - 5 WBC/hpf   Bacteria, UA RARE (A) NONE SEEN   Squamous Epithelial / LPF NONE SEEN NONE SEEN  Comprehensive metabolic panel  Result Value Ref Range   Sodium 135 135 - 145 mmol/L   Potassium 4.2 3.5 - 5.1 mmol/L   Chloride 103 101 - 111 mmol/L   CO2 24 22 - 32 mmol/L   Glucose, Bld 152 (H) 65 - 99 mg/dL   BUN 12 6 - 20 mg/dL   Creatinine, Ser 1.10 (H) 0.44 - 1.00 mg/dL   Calcium 10.1 8.9 - 10.3 mg/dL   Total Protein 6.5 6.5 - 8.1 g/dL   Albumin 3.6 3.5 - 5.0 g/dL   AST 18 15 - 41 U/L   ALT 15 14 - 54 U/L   Alkaline Phosphatase 81 38 - 126 U/L   Total Bilirubin 0.6 0.3 - 1.2 mg/dL   GFR calc non Af Amer 43 (L) >60 mL/min   GFR calc Af Amer 49 (L) >60 mL/min   Anion gap 8 5 - 15  CBC with Differential  Result Value Ref Range   WBC 11.0 (H) 4.0 - 10.5 K/uL   RBC 3.04 (L) 3.87 - 5.11 MIL/uL   Hemoglobin 8.7 (L) 12.0 - 15.0 g/dL   HCT 27.7 (L) 36.0 - 46.0 %   MCV 91.1 78.0 - 100.0 fL   MCH 28.6 26.0 - 34.0 pg   MCHC 31.4 30.0 - 36.0 g/dL   RDW 16.1 (H) 11.5 - 15.5 %   Platelets  321 150 - 400 K/uL   Neutrophils Relative % 86 %   Neutro Abs 9.5 (H) 1.7 - 7.7 K/uL   Lymphocytes Relative 6 %   Lymphs Abs 0.7 0.7 - 4.0 K/uL   Monocytes Relative 7 %   Monocytes Absolute 0.7 0.1 - 1.0 K/uL   Eosinophils Relative 1 %   Eosinophils Absolute 0.1 0.0 - 0.7 K/uL   Basophils  Relative 0 %   Basophils Absolute 0.0 0.0 - 0.1 K/uL    EKG  EKG Interpretation None       Radiology No results found. No results found.  Procedures Procedures (including critical care time)  Medications Ordered in ED Medications  morphine 4 MG/ML injection 4 mg (not administered)  ondansetron (ZOFRAN) injection 4 mg (not administered)     Initial Impression / Assessment and Plan / ED Course  I have reviewed the triage vital signs and the nursing notes.  Pertinent labs & imaging results that were available during my care of the patient were reviewed by me and considered in my medical decision making (see chart for details).     Patient is a 83 yo here for uncontrolled flank pain s/p ureteroscopy for treatment of renal carcinoma last week. Post op hematuria improving but left flank pain becoming severe yesterday.   Morphine and Zofran provided with relief of pain. Given advanced age, recent procedure, and complicated medical history, will obtain CT scan abdomen and pelvis. Labs are reassuring. VSS.  Patient care signed out to [facility], PA-C, pending CT scan for final disposition.  Final Clinical Impressions(s) / ED Diagnoses   Final diagnoses:  None   1. Post operative pain  New Prescriptions New Prescriptions   No medications on file     Charlann Lange, Hershal Coria 02/06/17 0703    Merryl Hacker, MD 02/10/17 [phone]

## 2017-02-06 NOTE — ED Notes (Signed)
IV TAKEN OUT ON RIGHT HAND

## 2017-02-06 NOTE — ED Notes (Signed)
ED Provider at bedside. 

## 2017-02-07 LAB — URINE CULTURE

## 2017-02-13 ENCOUNTER — Ambulatory Visit (INDEPENDENT_AMBULATORY_CARE_PROVIDER_SITE_OTHER): Payer: Medicare Other | Admitting: Internal Medicine

## 2017-02-13 ENCOUNTER — Encounter: Payer: Self-pay | Admitting: Internal Medicine

## 2017-02-13 VITALS — BP 128/72 | HR 77 | Temp 98.7°F | Ht 63.0 in | Wt 125.2 lb

## 2017-02-13 DIAGNOSIS — E039 Hypothyroidism, unspecified: Secondary | ICD-10-CM

## 2017-02-13 DIAGNOSIS — R310 Gross hematuria: Secondary | ICD-10-CM

## 2017-02-13 DIAGNOSIS — N289 Disorder of kidney and ureter, unspecified: Secondary | ICD-10-CM | POA: Diagnosis not present

## 2017-02-13 DIAGNOSIS — I10 Essential (primary) hypertension: Secondary | ICD-10-CM | POA: Diagnosis not present

## 2017-02-13 LAB — CBC WITH DIFFERENTIAL/PLATELET
BASOS PCT: 0.5 % (ref 0.0–3.0)
Basophils Absolute: 0 10*3/uL (ref 0.0–0.1)
EOS ABS: 0.1 10*3/uL (ref 0.0–0.7)
EOS PCT: 1.2 % (ref 0.0–5.0)
Lymphs Abs: 0.4 10*3/uL — ABNORMAL LOW (ref 0.7–4.0)
MCHC: 32.4 g/dL (ref 30.0–36.0)
MCV: 87.5 fl (ref 78.0–100.0)
MONO ABS: 0.5 10*3/uL (ref 0.1–1.0)
Monocytes Relative: 6.9 % (ref 3.0–12.0)
Neutro Abs: 6.6 10*3/uL (ref 1.4–7.7)
Neutrophils Relative %: 86.6 % — ABNORMAL HIGH (ref 43.0–77.0)
Platelets: 469 10*3/uL — ABNORMAL HIGH (ref 150.0–400.0)
RBC: 3 Mil/uL — AB (ref 3.87–5.11)
RDW: 18 % — AB (ref 11.5–15.5)
WBC: 7.6 10*3/uL (ref 4.0–10.5)

## 2017-02-13 LAB — TSH: TSH: 2.32 u[IU]/mL (ref 0.35–4.50)

## 2017-02-13 NOTE — Progress Notes (Signed)
Subjective:    Patient ID: Sherry [name], female    DOB: [birth year], 83 y.o.   MRN: [id number]  HPI  Admit date: 01/28/2017 Discharge date: 01/31/2017  Primary Care Physician:  Nyoka Cowden, MD  Discharge Diagnoses:  Transitional cell carcinoma of left renal pelvis Anemia from hemorrhage  Present on Admission: . Hematuria, gross  Admitted for endoscopic management of bleeding from a papillary transitional cell carcinoma of her left kidney  83 year old patient who is seen today following a recent hospital discharge.  She was admitted with gross hematuria and underwent endoscopic treatment for a transitional cell carcinoma of the left renal pelvis. She continues to have bleeding.  She was seen in the ED.  7 days ago due to persistent gross hematuria.  She did require blood transfusion as an inpatient. She does complain of some weakness.  No iron supplement or aspirin therapy.  Past Medical History:  Diagnosis Date  . Anemia   . Arthritis   . Bronchitis   . CAD (coronary artery disease)   . COPD (chronic obstructive pulmonary disease) ([place])   . Emphysema of lung ([place])   . GERD (gastroesophageal reflux disease)   . Headache(784.0)   . History of blood transfusion   . Hyperlipidemia   . Hypertension   . Hypothyroidism   . Osteoporosis   . Rectal adenocarcinoma ([place]) dx'd 08/2009  . Rectal cancer ([place])   . Scoliosis    per Dr Melony Overly ortho  . Shingles   . Shortness of breath   . Tendon adhesions    torn tendon right shoulder     Social History   Social History  . Marital status: Married    Spouse name: N/A  . Number of children: 3  . Years of education: N/A   Occupational History  . Not on file.   Social History Main Topics  . Smoking status: Former Smoker    Years: 65.00    Types: Cigarettes    Quit date: 12/18/2003  . Smokeless tobacco: Never Used     Comment: quit 2005  . Alcohol use No  . Drug use: No  . Sexual activity: No    Other Topics Concern  . Not on file   Social History Narrative  . No narrative on file    Past Surgical History:  Procedure Laterality Date  . APPENDECTOMY    . CARDIAC CATHETERIZATION    . CHOLECYSTECTOMY    . COLON REMOVAL     8 " 12/2009 DR TSUEI  . COLON SURGERY     apr for rectal cancer 2011  . COLONOSCOPY    . COLOSTOMY    . CYSTOSCOPY WITH RETROGRADE PYELOGRAM, URETEROSCOPY AND STENT PLACEMENT Left 03/29/2016   Procedure: CYSTOSCOPY WITH RETROGRADE PYELOGRAM, URETEROSCOPY AND STENT PLACEMENT;  Surgeon: Kathie Rhodes, MD;  Location: [facility];  Service: Urology;  Laterality: Left;  With Laser  . CYSTOSCOPY/RETROGRADE/URETEROSCOPY Left 01/28/2017   Procedure: CYSTOSCOPY/RETROGRADE/URETEROSCOPY (FLEX 6 fr) THULIUM LASER OF TUMOR;  Surgeon: Franchot Gallo, MD;  Location: [facility];  Service: Urology;  Laterality: Left;  . EYE SURGERY     Cataract with lens- bil  . OOPHORECTOMY    . ovary tumor removal     after son was born  . PARASTOMAL HERNIA REPAIR  10/30/2012  . POLYPECTOMY    . RECTUM REMOVAL     1/11 DR TSUEI  . TUBAL LIGATION    . VAGINAL HYSTERECTOMY    . VENTRAL HERNIA REPAIR  10/30/2012  Procedure: LAPAROSCOPIC VENTRAL HERNIA;  Surgeon: Imogene Burn. Georgette Dover, MD;  Location: [facility];  Service: General;  Laterality: N/A;  Laparoscopic repair of parastomal hernia    Family History  Problem Relation Age of Onset  . Heart failure Sister   . Cervical cancer Sister   . Heart failure Brother   . Colon cancer Brother   . Colon cancer Brother   . Heart failure Father   . Cancer Mother     HEAD AND NECK  . Cervical cancer Daughter   . Kidney cancer Daughter     Allergies  Allergen Reactions  . Percocet [Oxycodone-Acetaminophen] Other (See Comments)    Upsets stomach  . Influenza Virus Vacc Split Pf Other (See Comments)    Unknown  But had to be hospitalized   . Sulfamethoxazole-Trimethoprim Nausea And Vomiting and Other (See Comments)    Current Outpatient  Prescriptions on File Prior to Visit  Medication Sig Dispense Refill  . acetaminophen (TYLENOL) 650 MG CR tablet Take 1,300 mg by mouth every 8 (eight) hours as needed for pain.    Marland Kitchen atorvastatin (LIPITOR) 10 MG tablet TAKE 1 TABLET DAILY (Patient taking differently: TAKE 1 TABLET EVERY EVENING) 90 tablet 2  . diltiazem (TIAZAC) 180 MG 24 hr capsule TAKE 1 CAPSULE DAILY 90 capsule 3  . furosemide (LASIX) 20 MG tablet Take 20 mg by mouth daily as needed for fluid.     Marland Kitchen HYDROcodone-acetaminophen (NORCO) 7.5-325 MG tablet Take 1-2 tablets by mouth every 4 (four) hours as needed for moderate pain. Maximum dose per 24 hours - 8 pills (Patient taking differently: Take 1 tablet by mouth every 4 (four) hours as needed for moderate pain. Maximum dose per 24 hours - 8 pills) 30 tablet 0  . levothyroxine (SYNTHROID, LEVOTHROID) 75 MCG tablet TAKE 1 TABLET DAILY 90 tablet 2  . meclizine (ANTIVERT) 25 MG tablet Take 1 tablet (25 mg total) by mouth 3 (three) times daily as needed for dizziness. 30 tablet 1  . oxybutynin (DITROPAN) 5 MG tablet Take 5 mg by mouth every morning.    . phenazopyridine (PYRIDIUM) 200 MG tablet Take 1 tablet (200 mg total) by mouth 3 (three) times daily as needed for pain. 30 tablet 0  . PROAIR HFA 108 (90 Base) MCG/ACT inhaler USE 2 INHALATIONS INTO THE LUNGS EVERY 4 HOURS AS NEEDED FOR SHORTNESS OF BREATH 25.5 g 5  . TOPROL XL 25 MG 24 hr tablet TAKE ONE-HALF (1/2) TABLET (12.5 MG TOTAL) DAILY (Patient taking differently: Take 12.5 mg by mouth every evening. TAKE ONE-HALF (1/2) TABLET (12.5 MG TOTAL) DAILY) 90 tablet 1  . traMADol-acetaminophen (ULTRACET) 37.5-325 MG per tablet Take 1 tablet by mouth every 6 (six) hours as needed for moderate pain. 60 tablet 5   No current facility-administered medications on file prior to visit.     BP 128/72 (BP Location: Left Arm, Patient Position: Sitting, Cuff Size: Normal)   Pulse 77   Temp 98.7 F (37.1 C) (Oral)   Ht 5\' 3"  (1.6 m)   Wt  125 lb 3.2 oz (56.8 kg)   SpO2 98%   BMI 22.18 kg/m      Review of Systems  Constitutional: Positive for fatigue.  HENT: Negative for congestion, dental problem, hearing loss, rhinorrhea, sinus pressure, sore throat and tinnitus.   Eyes: Negative for pain, discharge and visual disturbance.  Respiratory: Negative for cough and shortness of breath.   Cardiovascular: Negative for chest pain, palpitations and leg swelling.  Gastrointestinal:  Negative for abdominal distention, abdominal pain, blood in stool, constipation, diarrhea, nausea and vomiting.  Genitourinary: Positive for flank pain and hematuria. Negative for difficulty urinating, dysuria, frequency, pelvic pain, urgency, vaginal bleeding, vaginal discharge and vaginal pain.  Musculoskeletal: Negative for arthralgias, gait problem and joint swelling.  Skin: Negative for rash.  Neurological: Positive for weakness. Negative for dizziness, syncope, speech difficulty, numbness and headaches.  Hematological: Negative for adenopathy.  Psychiatric/Behavioral: Negative for agitation, behavioral problems and dysphoric mood. The patient is not nervous/anxious.        Objective:   Physical Exam  Constitutional: She is oriented to person, place, and time. She appears well-developed and well-nourished.  Elderly  no distress No tachycardia Blood pressure 128/70  HENT:  Head: Normocephalic.  Right Ear: External ear normal.  Left Ear: External ear normal.  Mouth/Throat: Oropharynx is clear and moist.  chronic benign nodule, right pharyngeal arch  Eyes: Conjunctivae and EOM are normal. Pupils are equal, round, and reactive to light.  Neck: Normal range of motion. Neck supple. No thyromegaly present.  Cardiovascular: Normal rate, regular rhythm, normal heart sounds and intact distal pulses.   Pulmonary/Chest: Effort normal and breath sounds normal.  Abdominal: Soft. Bowel sounds are normal. She exhibits no mass. There is no tenderness.    colostomy  Musculoskeletal: Normal range of motion. She exhibits no edema.  Lymphadenopathy:    She has no cervical adenopathy.  Neurological: She is alert and oriented to person, place, and time.  Skin: Skin is warm and dry. No rash noted.  Psychiatric: She has a normal mood and affect. Her behavior is normal.          Assessment & Plan:   Transitional cell carcinoma left renal pelvis Persistent gross hematuria.  Check CBC.  Follow-up urology.  We'll place on iron supplement twice daily Essential hypertension, stable Hypothyroidism.  Will check TSH  Follow-up 3-6 months Urology follow-up  Nyoka Cowden

## 2017-02-13 NOTE — Progress Notes (Signed)
Pre visit review using our clinic review tool, if applicable. No additional management support is needed unless otherwise documented below in the visit note. 

## 2017-02-13 NOTE — Patient Instructions (Signed)
  Urology follow-up as scheduled Take an iron supplement twice daily  Report any worsening bleeding or weakness  Furosemide 20 mg daily only as needed for fluid control

## 2017-02-15 ENCOUNTER — Telehealth: Payer: Self-pay | Admitting: Internal Medicine

## 2017-02-15 NOTE — Telephone Encounter (Signed)
Pt would like to have her results from 02/13/17 she is very concerned about her TSH.

## 2017-02-16 ENCOUNTER — Other Ambulatory Visit: Payer: Self-pay | Admitting: Internal Medicine

## 2017-02-18 NOTE — Telephone Encounter (Signed)
Pt calling again to get the results of her labs

## 2017-02-18 NOTE — Telephone Encounter (Signed)
Called and discussed Patient still having hematuria Patient is scheduled to see urology in follow-up soon Patient has been told to come in for a follow-up CBC.  If there is any clinical worsening or progressive weakness

## 2017-02-18 NOTE — Telephone Encounter (Signed)
See message below, please advise.

## 2017-02-26 ENCOUNTER — Ambulatory Visit: Payer: Medicare Other | Admitting: Urology

## 2017-02-27 ENCOUNTER — Ambulatory Visit (INDEPENDENT_AMBULATORY_CARE_PROVIDER_SITE_OTHER): Payer: Medicare Other | Admitting: Urology

## 2017-02-27 ENCOUNTER — Other Ambulatory Visit (HOSPITAL_COMMUNITY)
Admission: AD | Admit: 2017-02-27 | Discharge: 2017-02-27 | Disposition: A | Discharge status: Home / Self Care | Payer: Medicare Other | Source: Skilled Nursing Facility | Attending: Urology | Admitting: Urology

## 2017-02-27 DIAGNOSIS — C642 Malignant neoplasm of left kidney, except renal pelvis: Secondary | ICD-10-CM | POA: Diagnosis not present

## 2017-02-28 ENCOUNTER — Other Ambulatory Visit: Payer: Self-pay | Admitting: Urology

## 2017-03-01 LAB — URINE CULTURE

## 2017-03-08 NOTE — Patient Instructions (Signed)
Sherry [name]  03/08/2017     @PREFPERIOPPHARMACY @   Your procedure is scheduled on 03/18/2017.  Report to [name] at 9:10 A.M.  Call this number if you have problems the morning of surgery:  [phone]   Remember:  Do not eat food or drink liquids after midnight.  Take these medicines the morning of surgery with A SIP OF WATER Diltiazem, Norco OR Tramadol if needed, Synthroid, Antivert if needed, Ditropan,   Pro Air and bring with you, Toprol   Do not wear jewelry, make-up or nail polish.  Do not wear lotions, powders, or perfumes, or deoderant.  Do not shave 48 hours prior to surgery.  Men may shave face and neck.  Do not bring valuables to the hospital.  [facility] is not responsible for any belongings or valuables.  Contacts, dentures or bridgework may not be worn into surgery.  Leave your suitcase in the car.  After surgery it may be brought to your room.  For patients admitted to the hospital, discharge time will be determined by your treatment team.  Patients discharged the day of surgery will not be allowed to drive home.    Please read over the following fact sheets that you were given. Surgical Site Infection Prevention and Anesthesia Post-op Instructions     PATIENT INSTRUCTIONS POST-ANESTHESIA  IMMEDIATELY FOLLOWING SURGERY:  Do not drive or operate machinery for the first twenty four hours after surgery.  Do not make any important decisions for twenty four hours after surgery or while taking narcotic pain medications or sedatives.  If you develop intractable nausea and vomiting or a severe headache please notify your doctor immediately.  FOLLOW-UP:  Please make an appointment with your surgeon as instructed. You do not need to follow up with anesthesia unless specifically instructed to do so.  WOUND CARE INSTRUCTIONS (if applicable):  Keep a dry clean dressing on the anesthesia/puncture wound site if there is drainage.  Once the wound has quit draining  you may leave it open to air.  Generally you should leave the bandage intact for twenty four hours unless there is drainage.  If the epidural site drains for more than 36-48 hours please call the anesthesia department.  QUESTIONS?:  Please feel free to call your physician or the hospital operator if you have any questions, and they will be happy to assist you.      Ureteral Stent Implantation Ureteral stent implantation is a procedure to insert (implant) a flexible, soft, plastic tube (stent) into a tube (ureter) that drains urine from the kidneys. The stent supports the ureter while it heals and helps to drain urine from the kidneys. You may have a ureteral stent implanted after having a procedure to remove a blockage from the ureter (ureterolysis or pyeloplasty).You may also have a stent implanted to open the flow of urine when you have a blockage caused by a kidney stone, tumor, blood clot, or infection. You have two ureters, one on each side of the body. The ureters connect the kidneys to the organ that holds urine until it passes out of the body (bladder). The stent is placed so that one end is in the kidney, and one end is in the bladder. The stent is usually taken out after your ureter has healed. Depending on your condition, you may have a stent for just a few weeks, or you may have a long-term stent that will need to be replaced every few months. Tell a health care  provider about:  Any allergies you have.  All medicines you are taking, including vitamins, herbs, eye drops, creams, and over-the-counter medicines.  Any problems you or family members have had with anesthetic medicines.  Any blood disorders you have.  Any surgeries you have had.  Any medical conditions you have.  Whether you are pregnant or may be pregnant. What are the risks? Generally, this is a safe procedure. However, problems may occur, including:  Infection.  Bleeding.  Allergic reactions to  medicines.  Damage to other structures or organs. Tearing (perforation) of the ureter is possible.  Movement of the stent away from where it is placed during surgery (migration). What happens before the procedure?  Ask your health care provider about:  Changing or stopping your regular medicines. This is especially important if you are taking diabetes medicines or blood thinners.  Taking medicines such as aspirin and ibuprofen. These medicines can thin your blood. Do not take these medicines before your procedure if your health care provider instructs you not to.  Follow instructions from your health care provider about eating or drinking restrictions.  Do not drink alcohol and do not use any tobacco products before your procedure, as told by your health care provider.  You may be given antibiotic medicine to help prevent infection.  Plan to have someone take you home after the procedure.  If you go home right after the procedure, plan to have someone with you for 24 hours. What happens during the procedure?  An IV tube will be inserted into one of your veins.  You will be given a medicine to make you fall asleep (general anesthetic). You may also be given a medicine to help you relax (sedative).  A thin, tube-shaped instrument with a light and tiny camera at the end (cystoscope) will be inserted into your urethra. The urethra is the tube that drains urine from the bladder out of the body. In men, the urethra opens at the end of the penis. In women, the urethra opens in front of the vaginal opening.  The cystoscope will be passed into your bladder.  A thin wire (guide wire) will be passed through your bladder and into your ureter. This is used to guide the stent into your ureter.  The stent will be inserted into your ureter.  The guide wire and the cystoscope will be removed.  A flexible tube (catheter) will be inserted through your urethra so that one end is in your bladder.  This helps to drain urine from your bladder. The procedure may vary among hospitals and health care providers. What happens after the procedure?  Your blood pressure, heart rate, breathing rate, and blood oxygen level will be monitored often until the medicines you were given have worn off.  You may continue to receive medicine and fluids through an IV tube.  You may have some soreness or pain in your abdomen and urethra. Medicines will be available to help you.  You will be encouraged to get up and walk around as soon as you can.  You will have a catheter draining your urine.  You will have some blood in your urine.  Do not drive for 24 hours if you received a sedative. This information is not intended to replace advice given to you by your health care provider. Make sure you discuss any questions you have with your health care provider. Document Released: 11/30/2000 Document Revised: 05/10/2016 Document Reviewed: 06/17/2015 Elsevier Interactive Patient Education  2017 [employer].  Cystoscopy Cystoscopy is a procedure that is used to help diagnose and sometimes treat conditions that affect that lower urinary tract. The lower urinary tract includes the bladder and the tube that drains urine from the bladder out of the body (urethra). Cystoscopy is performed with a thin, tube-shaped instrument with a light and camera at the end (cystoscope). The cystoscope may be hard (rigid) or flexible, depending on the goal of the procedure.The cystoscope is inserted through the urethra, into the bladder. Cystoscopy may be recommended if you have:  Urinary tractinfections that keep coming back (recurring).  Blood in the urine (hematuria).  Loss of bladder control (urinary incontinence) or an overactive bladder.  Unusual cells found in a urine sample.  A blockage in the urethra.  Painful urination.  An abnormality in the bladder found during an intravenous pyelogram (IVP) or CT  scan. Cystoscopy may also be done to remove a sample of tissue to be examined under a microscope (biopsy). Tell a health care provider about:  Any allergies you have.  All medicines you are taking, including vitamins, herbs, eye drops, creams, and over-the-counter medicines.  Any problems you or family members have had with anesthetic medicines.  Any blood disorders you have.  Any surgeries you have had.  Any medical conditions you have.  Whether you are pregnant or may be pregnant. What are the risks? Generally, this is a safe procedure. However, problems may occur, including:  Infection.  Bleeding.  Allergic reactions to medicines.  Damage to other structures or organs. What happens before the procedure?  Ask your health care provider about:  Changing or stopping your regular medicines. This is especially important if you are taking diabetes medicines or blood thinners.  Taking medicines such as aspirin and ibuprofen. These medicines can thin your blood. Do not take these medicines before your procedure if your health care provider instructs you not to.  Follow instructions from your health care provider about eating or drinking restrictions.  You may be given antibiotic medicine to help prevent infection.  You may have an exam or testing, such as X-rays of the bladder, urethra, or kidneys.  You may have urine tests to check for signs of infection.  Plan to have someone take you home after the procedure. What happens during the procedure?  To reduce your risk of infection,your health care team will wash or sanitize their hands.  You will be given one or more of the following:  A medicine to help you relax (sedative).  A medicine to numb the area (local anesthetic).  The area around the opening of your urethra will be cleaned.  The cystoscope will be passed through your urethra into your bladder.  Germ-free (sterile)fluid will flow through the cystoscope  to fill your bladder. The fluid will stretch your bladder so that your surgeon can clearly examine your bladder walls.  The cystoscope will be removed and your bladder will be emptied. The procedure may vary among health care providers and hospitals. What happens after the procedure?  You may have some soreness or pain in your abdomen and urethra. Medicines will be available to help you.  You may have some blood in your urine.  Do not drive for 24 hours if you received a sedative. This information is not intended to replace advice given to you by your health care provider. Make sure you discuss any questions you have with your health care provider. Document Released: 11/30/2000 Document Revised: 04/12/2016 Document Reviewed: 10/20/2015 Elsevier Interactive Patient  Education  2017 [employer] Inc[employer]  

## 2017-03-12 ENCOUNTER — Encounter (HOSPITAL_COMMUNITY)
Admission: RE | Admit: 2017-03-12 | Discharge: 2017-03-12 | Disposition: A | Discharge status: Home / Self Care | Payer: Medicare Other | Source: Ambulatory Visit | Attending: Urology | Admitting: Urology

## 2017-03-12 ENCOUNTER — Encounter (HOSPITAL_COMMUNITY): Payer: Self-pay

## 2017-03-12 DIAGNOSIS — Z01818 Encounter for other preprocedural examination: Secondary | ICD-10-CM | POA: Diagnosis not present

## 2017-03-12 DIAGNOSIS — C642 Malignant neoplasm of left kidney, except renal pelvis: Secondary | ICD-10-CM | POA: Insufficient documentation

## 2017-03-12 DIAGNOSIS — Z01812 Encounter for preprocedural laboratory examination: Secondary | ICD-10-CM | POA: Diagnosis not present

## 2017-03-12 HISTORY — DX: Personal history of urinary calculi: Z87.442

## 2017-03-12 LAB — CBC WITH DIFFERENTIAL/PLATELET
BASOS ABS: 0 10*3/uL (ref 0.0–0.1)
BASOS PCT: 0 %
Eosinophils Absolute: 0.1 10*3/uL (ref 0.0–0.7)
Eosinophils Relative: 2 %
HEMATOCRIT: 27.9 % — AB (ref 36.0–46.0)
HEMOGLOBIN: 8.5 g/dL — AB (ref 12.0–15.0)
Lymphocytes Relative: 10 %
Lymphs Abs: 0.6 10*3/uL — ABNORMAL LOW (ref 0.7–4.0)
MCH: 26 pg (ref 26.0–34.0)
MCHC: 30.5 g/dL (ref 30.0–36.0)
MCV: 85.3 fL (ref 78.0–100.0)
Monocytes Absolute: 0.4 10*3/uL (ref 0.1–1.0)
Monocytes Relative: 7 %
NEUTROS ABS: 4.8 10*3/uL (ref 1.7–7.7)
NEUTROS PCT: 81 %
Platelets: 299 10*3/uL (ref 150–400)
RBC: 3.27 MIL/uL — ABNORMAL LOW (ref 3.87–5.11)
RDW: 16.9 % — ABNORMAL HIGH (ref 11.5–15.5)
WBC: 5.8 10*3/uL (ref 4.0–10.5)

## 2017-03-12 LAB — BASIC METABOLIC PANEL
ANION GAP: 5 (ref 5–15)
BUN: 14 mg/dL (ref 6–20)
CHLORIDE: 105 mmol/L (ref 101–111)
CO2: 26 mmol/L (ref 22–32)
Calcium: 10.1 mg/dL (ref 8.9–10.3)
Creatinine, Ser: 1.1 mg/dL — ABNORMAL HIGH (ref 0.44–1.00)
GFR calc non Af Amer: 43 mL/min — ABNORMAL LOW (ref >60)
GFR, EST AFRICAN AMERICAN: 49 mL/min — AB (ref >60)
Glucose, Bld: 132 mg/dL — ABNORMAL HIGH (ref 65–99)
POTASSIUM: 4.7 mmol/L (ref 3.5–5.1)
Sodium: 136 mmol/L (ref 135–145)

## 2017-03-13 NOTE — Pre-Procedure Instructions (Signed)
Mickel Baas from [facility] calls back and Dr Alyson Ingles gives order for patient to be typed and crossed for 2 units. Dr Patsey Berthold wants patient to be transfused 1 unit prior to surgery on 03/18/2017. Called patient's daughter Tacey Heap who will be bringing patient, she is aware to have patient here at 0700 03/18/2017 to have 1 unit transfused. Daughter verbalizes understanding of this.Order entered for type and croos x2 and infusion of 1 unit on 03/18/2017.

## 2017-03-13 NOTE — Pre-Procedure Instructions (Signed)
Dr Patsey Berthold consulted for Hgb- 8.5 and HCT of 27.9. He suggests patient be set up for transfusion. [facility] and spoke to Mickel Baas who is going to message Dr Alyson Ingles to let him know of this. She will call us back and let us know what his treatment plan will be.

## 2017-03-18 ENCOUNTER — Ambulatory Visit (HOSPITAL_COMMUNITY): Payer: Medicare Other

## 2017-03-18 ENCOUNTER — Ambulatory Visit (HOSPITAL_COMMUNITY)
Admission: RE | Admit: 2017-03-18 | Discharge: 2017-03-18 | Disposition: A | Discharge status: Home / Self Care | Payer: Medicare Other | Source: Ambulatory Visit | Attending: Urology | Admitting: Urology

## 2017-03-18 ENCOUNTER — Ambulatory Visit (HOSPITAL_COMMUNITY): Payer: Medicare Other | Admitting: Anesthesiology

## 2017-03-18 ENCOUNTER — Encounter (HOSPITAL_COMMUNITY): Payer: Self-pay | Admitting: *Deleted

## 2017-03-18 ENCOUNTER — Encounter (HOSPITAL_COMMUNITY)
Admission: RE | Disposition: A | Discharge status: Home / Self Care | Payer: Self-pay | Source: Ambulatory Visit | Attending: Urology

## 2017-03-18 DIAGNOSIS — N1330 Unspecified hydronephrosis: Secondary | ICD-10-CM | POA: Diagnosis not present

## 2017-03-18 DIAGNOSIS — D649 Anemia, unspecified: Secondary | ICD-10-CM | POA: Diagnosis not present

## 2017-03-18 DIAGNOSIS — Z9049 Acquired absence of other specified parts of digestive tract: Secondary | ICD-10-CM | POA: Diagnosis not present

## 2017-03-18 DIAGNOSIS — D49512 Neoplasm of unspecified behavior of left kidney: Secondary | ICD-10-CM | POA: Diagnosis not present

## 2017-03-18 DIAGNOSIS — Z85048 Personal history of other malignant neoplasm of rectum, rectosigmoid junction, and anus: Secondary | ICD-10-CM | POA: Insufficient documentation

## 2017-03-18 DIAGNOSIS — M810 Age-related osteoporosis without current pathological fracture: Secondary | ICD-10-CM | POA: Insufficient documentation

## 2017-03-18 DIAGNOSIS — J439 Emphysema, unspecified: Secondary | ICD-10-CM | POA: Diagnosis not present

## 2017-03-18 DIAGNOSIS — Z8051 Family history of malignant neoplasm of kidney: Secondary | ICD-10-CM | POA: Diagnosis not present

## 2017-03-18 DIAGNOSIS — Z87891 Personal history of nicotine dependence: Secondary | ICD-10-CM | POA: Diagnosis not present

## 2017-03-18 DIAGNOSIS — E785 Hyperlipidemia, unspecified: Secondary | ICD-10-CM | POA: Insufficient documentation

## 2017-03-18 DIAGNOSIS — Z79899 Other long term (current) drug therapy: Secondary | ICD-10-CM | POA: Diagnosis not present

## 2017-03-18 DIAGNOSIS — I10 Essential (primary) hypertension: Secondary | ICD-10-CM | POA: Diagnosis not present

## 2017-03-18 DIAGNOSIS — E039 Hypothyroidism, unspecified: Secondary | ICD-10-CM | POA: Diagnosis not present

## 2017-03-18 DIAGNOSIS — Z85528 Personal history of other malignant neoplasm of kidney: Secondary | ICD-10-CM | POA: Diagnosis not present

## 2017-03-18 DIAGNOSIS — R310 Gross hematuria: Secondary | ICD-10-CM | POA: Diagnosis not present

## 2017-03-18 DIAGNOSIS — N132 Hydronephrosis with renal and ureteral calculous obstruction: Secondary | ICD-10-CM | POA: Diagnosis not present

## 2017-03-18 DIAGNOSIS — I2510 Atherosclerotic heart disease of native coronary artery without angina pectoris: Secondary | ICD-10-CM | POA: Insufficient documentation

## 2017-03-18 DIAGNOSIS — K219 Gastro-esophageal reflux disease without esophagitis: Secondary | ICD-10-CM | POA: Diagnosis not present

## 2017-03-18 DIAGNOSIS — C642 Malignant neoplasm of left kidney, except renal pelvis: Secondary | ICD-10-CM | POA: Diagnosis not present

## 2017-03-18 HISTORY — PX: CYSTOSCOPY W/ URETERAL STENT PLACEMENT: SHX1429

## 2017-03-18 LAB — HEMOGLOBIN AND HEMATOCRIT, BLOOD
HCT: 27.4 % — ABNORMAL LOW (ref 36.0–46.0)
HEMATOCRIT: 28.3 % — AB (ref 36.0–46.0)
HEMOGLOBIN: 8.4 g/dL — AB (ref 12.0–15.0)
Hemoglobin: 9 g/dL — ABNORMAL LOW (ref 12.0–15.0)

## 2017-03-18 LAB — ABO/RH: ABO/RH(D): B POS

## 2017-03-18 LAB — PREPARE RBC (CROSSMATCH)

## 2017-03-18 SURGERY — CYSTOSCOPY, WITH RETROGRADE PYELOGRAM AND URETERAL STENT INSERTION
Anesthesia: General | Laterality: Left

## 2017-03-18 MED ORDER — FENTANYL CITRATE (PF) 100 MCG/2ML IJ SOLN
INTRAMUSCULAR | Status: DC | PRN
  Administered 2017-03-18 (×2): 25 ug via INTRAVENOUS

## 2017-03-18 MED ORDER — MIDAZOLAM HCL 2 MG/2ML IJ SOLN: 1.0000 mg | INTRAMUSCULAR | Status: AC

## 2017-03-18 MED ORDER — MIDAZOLAM HCL 2 MG/2ML IJ SOLN
1.0000 mg | INTRAMUSCULAR | Status: AC
  Administered 2017-03-18: 1 mg via INTRAVENOUS
  Filled 2017-03-18: qty 2

## 2017-03-18 MED ORDER — HYDROCODONE-ACETAMINOPHEN 7.5-325 MG PO TABS: 1.0000 | ORAL_TABLET | ORAL | 0 refills | Status: DC | PRN

## 2017-03-18 MED ORDER — LACTATED RINGERS IV SOLN: INTRAVENOUS | Status: DC

## 2017-03-18 MED ORDER — MIDAZOLAM HCL 2 MG/2ML IJ SOLN
1.0000 mg | Freq: Once | INTRAMUSCULAR | Status: AC
  Administered 2017-03-18: 1 mg via INTRAVENOUS

## 2017-03-18 MED ORDER — PROPOFOL 10 MG/ML IV BOLUS
INTRAVENOUS | Status: AC
  Filled 2017-03-18: qty 20

## 2017-03-18 MED ORDER — FENTANYL CITRATE (PF) 100 MCG/2ML IJ SOLN
INTRAMUSCULAR | Status: AC
  Filled 2017-03-18: qty 2

## 2017-03-18 MED ORDER — DIATRIZOATE MEGLUMINE 30 % UR SOLN
URETHRAL | Status: AC
  Filled 2017-03-18: qty 300

## 2017-03-18 MED ORDER — DEXTROSE 5 % IV SOLN
1.0000 g | INTRAVENOUS | Status: AC
  Administered 2017-03-18: 1 g via INTRAVENOUS
  Filled 2017-03-18: qty 10

## 2017-03-18 MED ORDER — FENTANYL CITRATE (PF) 100 MCG/2ML IJ SOLN
25.0000 ug | INTRAMUSCULAR | Status: DC | PRN
Start: 2017-03-18 — End: 2017-03-18

## 2017-03-18 MED ORDER — LIDOCAINE HCL 1 % IJ SOLN
INTRAMUSCULAR | Status: DC | PRN
  Administered 2017-03-18: 20 mg via INTRADERMAL

## 2017-03-18 MED ORDER — PROPOFOL 10 MG/ML IV BOLUS
INTRAVENOUS | Status: DC | PRN
  Administered 2017-03-18: 30 mg via INTRAVENOUS
  Administered 2017-03-18: 100 mg via INTRAVENOUS

## 2017-03-18 MED ORDER — SODIUM CHLORIDE 0.9 % IV SOLN
INTRAVENOUS | Status: DC | PRN
  Administered 2017-03-18: 08:00:00 via INTRAVENOUS

## 2017-03-18 MED ORDER — DIATRIZOATE MEGLUMINE 30 % UR SOLN
URETHRAL | Status: DC | PRN
  Administered 2017-03-18: 18 mL via URETHRAL

## 2017-03-18 SURGICAL SUPPLY — 24 items
BAG DRAIN URO TABLE W/ADPT NS (DRAPE) ×3 IMPLANT
BAG HAMPER (MISCELLANEOUS) ×3 IMPLANT
CATH INTERMIT  6FR 70CM (CATHETERS) ×3 IMPLANT
CLOTH BEACON ORANGE TIMEOUT ST (SAFETY) ×3 IMPLANT
DECANTER SPIKE VIAL GLASS SM (MISCELLANEOUS) ×3 IMPLANT
EXTRACTOR STONE NITINOL NGAGE (UROLOGICAL SUPPLIES) IMPLANT
FIBER LASER FLEXIVA 200 (UROLOGICAL SUPPLIES) ×3 IMPLANT
FIBER LASER FLEXIVA 365 (UROLOGICAL SUPPLIES) IMPLANT
GLOVE BIO SURGEON STRL SZ8 (GLOVE) ×3 IMPLANT
GOWN STRL REUS W/ TWL XL LVL3 (GOWN DISPOSABLE) ×1 IMPLANT
GOWN STRL REUS W/TWL LRG LVL3 (GOWN DISPOSABLE) ×3 IMPLANT
GOWN STRL REUS W/TWL XL LVL3 (GOWN DISPOSABLE) ×2
GUIDEWIRE STR DUAL SENSOR (WIRE) ×3 IMPLANT
GUIDEWIRE STR ZIPWIRE 035X150 (MISCELLANEOUS) ×3 IMPLANT
IV NS IRRIG 3000ML ARTHROMATIC (IV SOLUTION) ×3 IMPLANT
KIT ROOM TURNOVER AP CYSTO (KITS) ×3 IMPLANT
MANIFOLD NEPTUNE II (INSTRUMENTS) ×3 IMPLANT
PACK CYSTO (CUSTOM PROCEDURE TRAY) ×3 IMPLANT
PAD ARMBOARD 7.5X6 YLW CONV (MISCELLANEOUS) ×3 IMPLANT
SHEATH ACCESS URETERAL 38CM (SHEATH) ×3 IMPLANT
STENT CONTOUR 7FRX24 (STENTS) ×3 IMPLANT
STENT URET 6FRX26 CONTOUR (STENTS) IMPLANT
SYRINGE 10CC LL (SYRINGE) ×3 IMPLANT
TOWEL OR 17X26 4PK STRL BLUE (TOWEL DISPOSABLE) ×3 IMPLANT

## 2017-03-18 NOTE — Anesthesia Postprocedure Evaluation (Signed)
Anesthesia Post Note  Patient: Sherry [name]  Procedure(s) Performed: Procedure(s) (LRB): CYSTOSCOPY WITH RETROGRADE PYELOGRAM/URETERAL STENT PLACEMENT AND URETERAL DIAGNOSTIC LEFT (Left)  Patient location during evaluation: PACU Anesthesia Type: General Level of consciousness: awake and patient cooperative Pain management: pain level controlled Vital Signs Assessment: post-procedure vital signs reviewed and stable Respiratory status: spontaneous breathing, nonlabored ventilation and respiratory function stable Cardiovascular status: blood pressure returned to baseline Postop Assessment: no signs of nausea or vomiting Anesthetic complications: no     Last Vitals:  Vitals:   03/18/17 1045 03/18/17 1100  BP: 126/61 132/64  Pulse:    Resp: (!) 34   Temp:      Last Pain:  Vitals:   03/18/17 1000  TempSrc: Oral  PainSc:                  Trampas Stettner J

## 2017-03-18 NOTE — Discharge Instructions (Signed)
Ureteral Stent Implantation, Care After °Refer to this sheet in the next few weeks. These instructions provide you with information about caring for yourself after your procedure. Your health care provider may also give you more specific instructions. Your treatment has been planned according to current medical practices, but problems sometimes occur. Call your health care provider if you have any problems or questions after your procedure. °What can I expect after the procedure? °After the procedure, it is common to have: °· Nausea. °· Mild pain when you urinate. You may feel this pain in your lower back or lower abdomen. Pain should stop within a few minutes after you urinate. This may last for up to 1 week. °· A small amount of blood in your urine for several days. °Follow these instructions at home: °  °Medicines  °· Take over-the-counter and prescription medicines only as told by your health care provider. °· If you were prescribed an antibiotic medicine, take it as told by your health care provider. Do not stop taking the antibiotic even if you start to feel better. °· Do not drive for 24 hours if you received a sedative. °· Do not drive or operate heavy machinery while taking prescription pain medicines. °Activity  °· Return to your normal activities as told by your health care provider. Ask your health care provider what activities are safe for you. °· Do not lift anything that is heavier than 10 lb (4.5 kg). Follow this limit for 1 week after your procedure, or for as long as told by your health care provider. °General instructions  °· Watch for any blood in your urine. Call your health care provider if the amount of blood in your urine increases. °· If you have a catheter: °¨ Follow instructions from your health care provider about taking care of your catheter and collection bag. °¨ Do not take baths, swim, or use a hot tub until your health care provider approves. °· Drink enough fluid to keep your urine  clear or pale yellow. °· Keep all follow-up visits as told by your health care provider. This is important. °Contact a health care provider if: °· You have pain that gets worse or does not get better with medicine, especially pain when you urinate. °· You have difficulty urinating. °· You feel nauseous or you vomit repeatedly during a period of more than 2 days after the procedure. °Get help right away if: °· Your urine is dark red or has blood clots in it. °· You are leaking urine (have incontinence). °· The end of the stent comes out of your urethra. °· You cannot urinate. °· You have sudden, sharp, or severe pain in your abdomen or lower back. °· You have a fever. °This information is not intended to replace advice given to you by your health care provider. Make sure you discuss any questions you have with your health care provider. °Document Released: 08/05/2013 Document Revised: 05/10/2016 Document Reviewed: 06/17/2015 °Elsevier Interactive Patient Education © 2017 Elsevier Inc. ° °

## 2017-03-18 NOTE — Brief Op Note (Signed)
03/18/2017  12:02 PM  PATIENT:  Sherry [name]  83 y.o. female  PRE-OPERATIVE DIAGNOSIS:  LEFT RENAL CELL TRANSITIONAL CELL CARCINOMA  POST-OPERATIVE DIAGNOSIS:  LEFT RENAL CELL TRANSITIONAL CELL   PROCEDURE:  Procedure(s) with comments: CYSTOSCOPY WITH RETROGRADE PYELOGRAM/URETERAL STENT PLACEMENT AND URETERAL DIAGNOSTIC LEFT (Left) - 1 HR [id number] MEDICARE-[id number] A [id number] - pt to arrive at 7:00 to receive blood  SURGEON:  Surgeon(s) and Role:    * Cleon Gustin, MD - Primary  PHYSICIAN ASSISTANT:   ASSISTANTS: none   ANESTHESIA:   general  EBL:  Total I/O In: 535 [I.V.:200; Blood:335] Out: 0   BLOOD ADMINISTERED:none  DRAINS: left 7x24 JJ ureteral stent   LOCAL MEDICATIONS USED:  NONE  SPECIMEN:  No Specimen  DISPOSITION OF SPECIMEN:  N/A  COUNTS:  YES  TOURNIQUET:  * No tourniquets in log *  DICTATION: .Note written in EPIC  PLAN OF CARE: Discharge to home after PACU  PATIENT DISPOSITION:  PACU - hemodynamically stable.   Delay start of Pharmacological VTE agent (>24hrs) due to surgical blood loss or risk of bleeding: not applicable

## 2017-03-18 NOTE — H&P (Addendum)
Urology Admission H&P  Chief Complaint: gross hematuria  History of Present Illness: Sherry [name] is a 83yo with a hx of left upper tract TCC who continues to have gross hematuria after tumor ablation 6 weeks ago.   Past Medical History:  Diagnosis Date  . Anemia   . Arthritis   . Bronchitis   . CAD (coronary artery disease)   . COPD (chronic obstructive pulmonary disease) ([place])   . Emphysema of lung ([place])   . GERD (gastroesophageal reflux disease)   . Headache(784.0)   . History of blood transfusion   . History of kidney stones   . Hyperlipidemia   . Hypertension   . Hypothyroidism   . Osteoporosis   . Rectal adenocarcinoma ([place]) dx'd 08/2009  . Rectal cancer ([place])   . Scoliosis    per Dr Melony Overly ortho  . Shingles   . Shortness of breath   . Tendon adhesions    torn tendon right shoulder   Past Surgical History:  Procedure Laterality Date  . APPENDECTOMY    . CARDIAC CATHETERIZATION    . CHOLECYSTECTOMY    . COLON REMOVAL     8 " 12/2009 DR TSUEI  . COLON SURGERY     apr for rectal cancer 2011  . COLONOSCOPY    . COLOSTOMY    . CYSTOSCOPY WITH RETROGRADE PYELOGRAM, URETEROSCOPY AND STENT PLACEMENT Left 03/29/2016   Procedure: CYSTOSCOPY WITH RETROGRADE PYELOGRAM, URETEROSCOPY AND STENT PLACEMENT;  Surgeon: Kathie Rhodes, MD;  Location: [facility];  Service: Urology;  Laterality: Left;  With Laser  . CYSTOSCOPY/RETROGRADE/URETEROSCOPY Left 01/28/2017   Procedure: CYSTOSCOPY/RETROGRADE/URETEROSCOPY (FLEX 6 fr) THULIUM LASER OF TUMOR;  Surgeon: Franchot Gallo, MD;  Location: [facility];  Service: Urology;  Laterality: Left;  . EYE SURGERY     Cataract with lens- bil  . OOPHORECTOMY    . ovary tumor removal     after son was born  . PARASTOMAL HERNIA REPAIR  10/30/2012  . POLYPECTOMY    . RECTUM REMOVAL     1/11 DR TSUEI  . TUBAL LIGATION    . VAGINAL HYSTERECTOMY    . VENTRAL HERNIA REPAIR  10/30/2012   Procedure: LAPAROSCOPIC VENTRAL HERNIA;  Surgeon: Imogene Burn. Georgette Dover,  MD;  Location: [facility];  Service: General;  Laterality: N/A;  Laparoscopic repair of parastomal hernia    Home Medications:  Prescriptions Prior to Admission  Medication Sig Dispense Refill Last Dose  . acetaminophen (TYLENOL) 650 MG CR tablet Take 1,300 mg by mouth every 8 (eight) hours as needed for pain.   Past Month at Unknown time  . atorvastatin (LIPITOR) 10 MG tablet TAKE 1 TABLET DAILY (Patient taking differently: TAKE 1 TABLET DAILY IN THE EVENING) 90 tablet 2 03/17/2017 at 1800  . diltiazem (TIAZAC) 180 MG 24 hr capsule TAKE 1 CAPSULE DAILY (Patient taking differently: TAKE 1 CAPSULE DAILY IN THE MORNING) 90 capsule 3 03/17/2017 at 1000  . ferrous sulfate (SLOW RELEASE IRON) 160 (50 Fe) MG TBCR SR tablet Take 1 tablet by mouth daily after lunch.   03/17/2017 at 1200  . furosemide (LASIX) 20 MG tablet Take 20 mg by mouth daily as needed for fluid.    Past Month at Unknown time  . HYDROcodone-acetaminophen (NORCO) 7.5-325 MG tablet Take 1-2 tablets by mouth every 4 (four) hours as needed for moderate pain. Maximum dose per 24 hours - 8 pills (Patient taking differently: Take 1 tablet by mouth every 4 (four) hours as needed for moderate pain. Maximum  dose per 24 hours - 8 pills) 30 tablet 0 Past Month at Unknown time  . levothyroxine (SYNTHROID, LEVOTHROID) 75 MCG tablet TAKE 1 TABLET DAILY (Patient taking differently: TAKE 1 TABLET DAILY IN THE MORNING) 90 tablet 2 03/18/2017 at 0600  . meclizine (ANTIVERT) 25 MG tablet Take 1 tablet (25 mg total) by mouth 3 (three) times daily as needed for dizziness. 30 tablet 1 Past Month at Unknown time  . oxybutynin (DITROPAN-XL) 10 MG 24 hr tablet TAKE 1 TABLET DAILY (Patient taking differently: TAKE 1 TABLET DAILY IN THE MORNING) 90 tablet 2 03/18/2017 at 0600  . phenazopyridine (PYRIDIUM) 200 MG tablet Take 1 tablet (200 mg total) by mouth 3 (three) times daily as needed for pain. 30 tablet 0 Past Month at Unknown time  . PROAIR HFA 108 (90 Base) MCG/ACT inhaler  USE 2 INHALATIONS INTO THE LUNGS EVERY 4 HOURS AS NEEDED FOR SHORTNESS OF BREATH 25.5 g 5 03/17/2017 at 2200  . TOPROL XL 25 MG 24 hr tablet TAKE ONE-HALF (1/2) TABLET (12.5 MG TOTAL) DAILY (Patient taking differently: Take 12.5 mg by mouth every evening. TAKE ONE-HALF (1/2) TABLET (12.5 MG TOTAL) DAILY) 90 tablet 1 03/18/2017 at 0600  . traMADol-acetaminophen (ULTRACET) 37.5-325 MG per tablet Take 1 tablet by mouth every 6 (six) hours as needed for moderate pain. 60 tablet 5 Past Month at Unknown time   Allergies:  Allergies  Allergen Reactions  . Percocet [Oxycodone-Acetaminophen] Other (See Comments)    Upsets stomach  . Influenza Virus Vacc Split Pf Other (See Comments)    Unknown  But had to be hospitalized   . Sulfamethoxazole-Trimethoprim Nausea And Vomiting and Other (See Comments)    Family History  Problem Relation Age of Onset  . Heart failure Sister   . Cervical cancer Sister   . Heart failure Brother   . Colon cancer Brother   . Colon cancer Brother   . Heart failure Father   . Cancer Mother     HEAD AND NECK  . Cervical cancer Daughter   . Kidney cancer Daughter    Social History:  reports that she quit smoking about 13 years ago. Her smoking use included Cigarettes. She has a 97.50 pack-year smoking history. She has never used smokeless tobacco. She reports that she does not drink alcohol or use drugs.  Review of Systems  Genitourinary: Positive for hematuria.  All other systems reviewed and are negative.   Physical Exam:  Vital signs in last 24 hours: Temp:  [98.3 F (36.8 C)-98.6 F (37 C)] 98.6 F (37 C) (04/02 0919) Pulse Rate:  [64] 64 (04/02 0729) Resp:  [17-95] 31 (04/02 1000) BP: (124-161)/(52-80) 124/52 (04/02 1000) SpO2:  [91 %-98 %] 92 % (04/02 1000) Physical Exam  Constitutional: She is oriented to person, place, and time. She appears well-developed and well-nourished.  HENT:  Head: Normocephalic and atraumatic.  Eyes: EOM are normal. Pupils are  equal, round, and reactive to light.  Neck: Normal range of motion. No thyromegaly present.  Cardiovascular: Normal rate and regular rhythm.   Respiratory: Effort normal. No respiratory distress.  GI: Soft. She exhibits no distension.  Musculoskeletal: Normal range of motion. She exhibits no edema.  Neurological: She is alert and oriented to person, place, and time.  Skin: Skin is warm and dry.  Psychiatric: She has a normal mood and affect. Her behavior is normal. Judgment and thought content normal.    Laboratory Data:  Results for orders placed or performed during the  hospital encounter of 03/18/17 (from the past 24 hour(s))  Type and screen [facility]     Status: None (Preliminary result)   Collection Time: 03/18/17  7:04 AM  Result Value Ref Range   ABO/RH(D) B POS    Antibody Screen NEG    Sample Expiration 03/21/2017    Unit Number [id number]    Blood Component Type RED CELLS,LR    Unit division 00    Status of Unit ISSUED    Transfusion Status OK TO TRANSFUSE    Crossmatch Result Compatible    Unit Number [id number]    Blood Component Type RED CELLS,LR    Unit division 00    Status of Unit ALLOCATED    Transfusion Status OK TO TRANSFUSE    Crossmatch Result Compatible   Hemoglobin and hematocrit, blood     Status: Abnormal   Collection Time: 03/18/17  7:04 AM  Result Value Ref Range   Hemoglobin 8.4 (L) 12.0 - 15.0 g/dL   HCT 27.4 (L) 36.0 - 46.0 %  Prepare RBC     Status: None   Collection Time: 03/18/17  7:14 AM  Result Value Ref Range   Order Confirmation ORDER PROCESSED BY BLOOD BANK   ABO/Rh     Status: None   Collection Time: 03/18/17  7:14 AM  Result Value Ref Range   ABO/RH(D) B POS    No results found for this or any previous visit (from the past 240 hour(s)). Creatinine:  Recent Labs  03/12/17 1123  CREATININE 1.10*   Baseline Creatinine: 1.1  Impression/Assessment:  83yo with left renal tumor and gross hematuria  Plan:  The  risks/benefits/alternatives to left ureteroscopic tumor ablation was explained to the patient and she understands and wishes to proceed with surgery  [name] 03/18/2017, 10:41 AM

## 2017-03-18 NOTE — Transfer of Care (Signed)
Immediate Anesthesia Transfer of Care Note  Patient: Sherry [name]  Procedure(s) Performed: Procedure(s) with comments: CYSTOSCOPY WITH RETROGRADE PYELOGRAM/URETERAL STENT PLACEMENT AND URETERAL DIAGNOSTIC LEFT (Left) - 1 HR [phone] [id number] A [phone] - pt to arrive at 7:00 to receive blood  Patient Location: PACU  Anesthesia Type:General  Level of Consciousness: awake and patient cooperative  Airway & Oxygen Therapy: Patient Spontanous Breathing and Patient connected to face mask oxygen  Post-op Assessment: Report given to RN and Patient moving all extremities  Post vital signs: Reviewed and stable  Last Vitals:  Vitals:   03/18/17 1045 03/18/17 1100  BP: 126/61 132/64  Pulse:    Resp: (!) 34   Temp:      Last Pain:  Vitals:   03/18/17 1000  TempSrc: Oral  PainSc:       Patients Stated Pain Goal: 5 (76/28/31 5176)  Complications: No apparent anesthesia complications

## 2017-03-18 NOTE — Anesthesia Procedure Notes (Signed)
Procedure Name: LMA Insertion Date/Time: 03/18/2017 11:26 AM Performed by: Charmaine Downs Pre-anesthesia Checklist: Patient identified, Patient being monitored, Emergency Drugs available, Timeout performed and Suction available Patient Re-evaluated:Patient Re-evaluated prior to inductionOxygen Delivery Method: Circle System Utilized Preoxygenation: Pre-oxygenation with 100% oxygen Intubation Type: IV induction Ventilation: Mask ventilation without difficulty LMA: LMA inserted LMA Size: 4.0 Number of attempts: 1 Placement Confirmation: positive ETCO2 and breath sounds checked- equal and bilateral Tube secured with: Tape Dental Injury: Teeth and Oropharynx as per pre-operative assessment

## 2017-03-18 NOTE — Anesthesia Preprocedure Evaluation (Signed)
Anesthesia Evaluation  Patient identified by MRN, date of birth, ID band Patient awake    Reviewed: Allergy & Precautions, NPO status , Patient's Chart, lab work & pertinent test results  Airway Mallampati: II  TM Distance: >3 FB Neck ROM: full    Dental  (+) Edentulous Upper, Edentulous Lower   Pulmonary shortness of breath, COPD, former smoker,    breath sounds clear to auscultation       Cardiovascular hypertension, + CAD and + DOE   Rhythm:regular Rate:Normal     Neuro/Psych  Headaches,    GI/Hepatic GERD  Controlled,  Endo/Other  Hypothyroidism   Renal/GU Renal diseaseL renal tumor     Musculoskeletal  (+) Arthritis ,   Abdominal   Peds  Hematology  (+) anemia ,   Anesthesia Other Findings   Reproductive/Obstetrics                             Anesthesia Physical Anesthesia Plan  ASA: III  Anesthesia Plan: General   Post-op Pain Management:    Induction: Intravenous  Airway Management Planned: LMA  Additional Equipment:   Intra-op Plan:   Post-operative Plan: Extubation in OR  Informed Consent: I have reviewed the patients History and Physical, chart, labs and discussed the procedure including the risks, benefits and alternatives for the proposed anesthesia with the patient or authorized representative who has indicated his/her understanding and acceptance.     Plan Discussed with:   Anesthesia Plan Comments: (Plan to transfuse 1 PRBC prior to surgery.)        Anesthesia Quick Evaluation

## 2017-03-19 ENCOUNTER — Encounter (HOSPITAL_COMMUNITY): Payer: Self-pay | Admitting: Urology

## 2017-03-19 LAB — TYPE AND SCREEN
ABO/RH(D): B POS
ANTIBODY SCREEN: NEGATIVE
UNIT DIVISION: 0
UNIT DIVISION: 0

## 2017-03-19 LAB — BPAM RBC
BLOOD PRODUCT EXPIRATION DATE: 201804232359
Blood Product Expiration Date: 201805032359
ISSUE DATE / TIME: 201804020851
ISSUE DATE / TIME: 201804021318
UNIT TYPE AND RH: 1700
Unit Type and Rh: 5100

## 2017-03-25 ENCOUNTER — Other Ambulatory Visit: Payer: Self-pay | Admitting: Urology

## 2017-03-25 NOTE — Op Note (Signed)
Preoperative diagnosis: Left hydronephrosis  Postoperative diagnosis: Same  Procedure: 1 cystoscopy 2.  left retrograde pyelography 3.  Intraoperative fluoroscopy, under one hour, with interpretation 4.  Left diagnostic ureteroscopy 5. Left 6 x24 JJ ureteral stent placement  Attending: Rosie Fate  Anesthesia: General  Estimated blood loss: None  Drains: left 6x24 JJ ureteral stent without tether  Specimens: none  Antibiotics: ancef  Findings: no masses/lesions in the bladder. Severe hydroureteronephrosis to the level of the iliac vessels. Scar versus recurrent TCC of the proximal ureter..  Indications: Patient is a 83 year old female with a history of Renal TCC who has had persistent gross hematuria.  After discussing treatment options, she decided proceed with right diagnostic ureteroscopy  Procedure her in detail: The patient was brought to the operating room and a brief timeout was done to ensure correct patient, correct procedure, correct site.  General anesthesia was administered patient was placed in dorsal lithotomy position.  Her genitalia was then prepped and draped in usual sterile fashion.  A rigid 53 French cystoscope was passed in the urethra and the bladder.  Bladder was inspected free masses or lesions.  the right ureteral orifices were in the normal orthotopic locations.  a 6 french ureteral catheter was then instilled into the right ureter orifice.  a gentle retrograde was obtained and findings noted above.  we then placed a zip wire through the ureteral catheter and advanced up to the renal pelvis.  we then removed the cystoscope and cannulated the right ureteral orifice with a semirigid ureteroscope.  we then performed ureteroscopy up to the level of the pelvic brim. We noted bleeding from a tumor at the pelvic brim and we were unable to advance the scope pat the tumor. We elected to place a stent. A 6x24 JJ ureteral stent was advanced over the original zipwire up  to the renal pelvis. The wire was removed and good coil was noted in the renal pelvis under fluoroscopy and the bladder under direct vision. No stone or tumor was encountered.  the bladder was then drained and this concluded the procedure which was well tolerated by patient.  Complications: None  Condition: Stable, extubated, transferred to PACU  Plan: Pt is to followup in 2 weeks for repeat ureteroscopy

## 2017-03-27 ENCOUNTER — Ambulatory Visit (INDEPENDENT_AMBULATORY_CARE_PROVIDER_SITE_OTHER): Payer: Medicare Other | Admitting: Urology

## 2017-03-27 DIAGNOSIS — C642 Malignant neoplasm of left kidney, except renal pelvis: Secondary | ICD-10-CM

## 2017-03-27 DIAGNOSIS — R3915 Urgency of urination: Secondary | ICD-10-CM | POA: Diagnosis not present

## 2017-04-02 ENCOUNTER — Encounter: Payer: Self-pay | Admitting: Family Medicine

## 2017-04-04 NOTE — Patient Instructions (Signed)
Sherry [name]  04/04/2017     @PREFPERIOPPHARMACY @   Your procedure is scheduled on  04/10/2017   Report to [name] at  83 A.M.  Call this number if you have problems the morning of surgery:  [phone]   Remember:  Do not eat food or drink liquids after midnight.  Take these medicines the morning of surgery with A SIP OF WATER  Diltiazem, hydrocodone, levothyroxine, antivert, mirbertiq, ditropan, pyridium, toprol XL, ultracet. Take your inhaler before you come.   Do not wear jewelry, make-up or nail polish.  Do not wear lotions, powders, or perfumes, or deoderant.  Do not shave 48 hours prior to surgery.  Men may shave face and neck.  Do not bring valuables to the hospital.  [facility] is not responsible for any belongings or valuables.  Contacts, dentures or bridgework may not be worn into surgery.  Leave your suitcase in the car.  After surgery it may be brought to your room.  For patients admitted to the hospital, discharge time will be determined by your treatment team.  Patients discharged the day of surgery will not be allowed to drive home.   Name and phone number of your driver:   family Special instructions:  None  Please read over the following fact sheets that you were given. Anesthesia Post-op Instructions and Care and Recovery After Surgery       Ureteroscopy Ureteroscopy is a procedure to check for and treat problems inside part of the urinary tract. In this procedure, a thin, tube-shaped instrument with a light at the end (ureteroscope) is used to look at the inside of the kidneys and the ureters, which are the tubes that carry urine from the kidneys to the bladder. The ureteroscope is inserted into one or both of the ureters. You may need this procedure if you have frequent urinary tract infections (UTIs), blood in your urine, or a stone in one of your ureters. A ureteroscopy can be done to find the cause of urine blockage in a  ureter and to evaluate other abnormalities inside the ureters or kidneys. If stones are found, they can be removed during the procedure. Polyps, abnormal tissue, and some types of tumors can also be removed or treated. The ureteroscope may also have a tool to remove tissue to be checked for disease under a microscope (biopsy). Tell a health care provider about:  Any allergies you have.  All medicines you are taking, including vitamins, herbs, eye drops, creams, and over-the-counter medicines.  Any problems you or family members have had with anesthetic medicines.  Any blood disorders you have.  Any surgeries you have had.  Any medical conditions you have.  Whether you are pregnant or may be pregnant. What are the risks? Generally, this is a safe procedure. However, problems may occur, including:  Bleeding.  Infection.  Allergic reactions to medicines.  Scarring that narrows the ureter (stricture).  Creating a hole in the ureter (perforation). What happens before the procedure? Staying hydrated  Follow instructions from your health care provider about hydration, which may include:  Up to 2 hours before the procedure - you may continue to drink clear liquids, such as water, clear fruit juice, black coffee, and plain tea. Eating and drinking restrictions  Follow instructions from your health care provider about eating and drinking, which may include:  8 hours before the procedure - stop eating heavy meals or foods  such as meat, fried foods, or fatty foods.  6 hours before the procedure - stop eating light meals or foods, such as toast or cereal.  6 hours before the procedure - stop drinking milk or drinks that contain milk.  2 hours before the procedure - stop drinking clear liquids. Medicines   Ask your health care provider about:  Changing or stopping your regular medicines. This is especially important if you are taking diabetes medicines or blood thinners.  Taking  medicines such as aspirin and ibuprofen. These medicines can thin your blood. Do not take these medicines before your procedure if your health care provider instructs you not to.  You may be given antibiotic medicine to help prevent infection. General instructions   You may have a urine sample taken to check for infection.  Plan to have someone take you home from the hospital or clinic. What happens during the procedure?  To reduce your risk of infection:  Your health care team will wash or sanitize their hands.  Your skin will be washed with soap.  An IV tube will be inserted into one of your veins.  You will be given one of the following:  A medicine to help you relax (sedative).  A medicine to make you fall asleep (general anesthetic).  A medicine that is injected into your spine to numb the area below and slightly above the injection site (spinal anesthetic).  To lower your risk of infection, you may be given an antibiotic medicine by an injection or through the IV tube.  The opening from which you urinate (urethra) will be cleaned with a germ-killing solution.  The ureteroscope will be passed through your urethra into your bladder.  A salt-water solution will flow through the ureteroscope to fill your bladder. This will help the health care provider see the openings of your ureters more clearly.  Then, the ureteroscope will be passed into your ureter.  If a growth is found, a piece of it may be removed so it can be examined under a microscope (biopsy).  If a stone is found, it may be removed through the ureteroscope, or the stone may be broken up using a laser, shock waves, or electrical energy.  In some cases, if the ureter is too small, a tube may be inserted that keeps the ureter open (ureteral stent). The stent may be left in place for 1 or 2 weeks to keep the ureter open, and then the ureteroscopy procedure will be performed.  The scope will be removed, and your  bladder will be emptied. The procedure may vary among health care providers and hospitals. What happens after the procedure?  Your blood pressure, heart rate, breathing rate, and blood oxygen level will be monitored until the medicines you were given have worn off.  You may be asked to urinate.  Donot drive for 24 hours if you were given a sedative. This information is not intended to replace advice given to you by your health care provider. Make sure you discuss any questions you have with your health care provider. Document Released: 12/08/2013 Document Revised: 09/18/2016 Document Reviewed: 09/14/2016 Elsevier Interactive Patient Education  2017 [employer]  Ureteroscopy, Care After This sheet gives you information about how to care for yourself after your procedure. Your health care provider may also give you more specific instructions. If you have problems or questions, contact your health care provider. What can I expect after the procedure? After the procedure, it is common to have:  A burning sensation when you urinate.  Blood in your urine.  Mild discomfort in the bladder area or kidney area when urinating.  Needing to urinate more often or urgently. Follow these instructions at home: Medicines   Take over-the-counter and prescription medicines only as told by your health care provider.  If you were prescribed an antibiotic medicine, take it as told by your health care provider. Do not stop taking the antibiotic even if you start to feel better. General instructions    Donot drive for 24 hours if you were given a medicine to help you relax (sedative) during your procedure.  To relieve burning, try taking a warm bath or holding a warm washcloth over your groin.  Drink enough fluid to keep your urine clear or pale yellow.  Drink two 8-ounce glasses of water every hour for the first 2 hours after you get home.  Continue to drink water often at home.  You can eat  what you usually do.  Keep all follow-up visits as told by your health care provider. This is important.  If you had a tube placed to keep urine flowing (ureteral stent), ask your health care provider when you need to return to have it removed. Contact a health care provider if:  You have chills or a fever.  You have burning pain for longer than 24 hours after the procedure.  You have blood in your urine for longer than 24 hours after the procedure. Get help right away if:  You have large amounts of blood in your urine.  You have blood clots in your urine.  You have very bad pain.  You have chest pain or trouble breathing.  You are unable to urinate and you have the feeling of a full bladder. This information is not intended to replace advice given to you by your health care provider. Make sure you discuss any questions you have with your health care provider. Document Released: 12/08/2013 Document Revised: 09/18/2016 Document Reviewed: 09/14/2016 Elsevier Interactive Patient Education  2017 [place].  Cystoscopy Cystoscopy is a procedure that is used to help diagnose and sometimes treat conditions that affect that lower urinary tract. The lower urinary tract includes the bladder and the tube that drains urine from the bladder out of the body (urethra). Cystoscopy is performed with a thin, tube-shaped instrument with a light and camera at the end (cystoscope). The cystoscope may be hard (rigid) or flexible, depending on the goal of the procedure.The cystoscope is inserted through the urethra, into the bladder. Cystoscopy may be recommended if you have:  Urinary tractinfections that keep coming back (recurring).  Blood in the urine (hematuria).  Loss of bladder control (urinary incontinence) or an overactive bladder.  Unusual cells found in a urine sample.  A blockage in the urethra.  Painful urination.  An abnormality in the bladder found during an intravenous  pyelogram (IVP) or CT scan. Cystoscopy may also be done to remove a sample of tissue to be examined under a microscope (biopsy). Tell a health care provider about:  Any allergies you have.  All medicines you are taking, including vitamins, herbs, eye drops, creams, and over-the-counter medicines.  Any problems you or family members have had with anesthetic medicines.  Any blood disorders you have.  Any surgeries you have had.  Any medical conditions you have.  Whether you are pregnant or may be pregnant. What are the risks? Generally, this is a safe procedure. However, problems may occur, including:  Infection.  Bleeding.  Allergic reactions to medicines.  Damage to other structures or organs. What happens before the procedure?  Ask your health care provider about:  Changing or stopping your regular medicines. This is especially important if you are taking diabetes medicines or blood thinners.  Taking medicines such as aspirin and ibuprofen. These medicines can thin your blood. Do not take these medicines before your procedure if your health care provider instructs you not to.  Follow instructions from your health care provider about eating or drinking restrictions.  You may be given antibiotic medicine to help prevent infection.  You may have an exam or testing, such as X-rays of the bladder, urethra, or kidneys.  You may have urine tests to check for signs of infection.  Plan to have someone take you home after the procedure. What happens during the procedure?  To reduce your risk of infection,your health care team will wash or sanitize their hands.  You will be given one or more of the following:  A medicine to help you relax (sedative).  A medicine to numb the area (local anesthetic).  The area around the opening of your urethra will be cleaned.  The cystoscope will be passed through your urethra into your bladder.  Germ-free (sterile)fluid will flow  through the cystoscope to fill your bladder. The fluid will stretch your bladder so that your surgeon can clearly examine your bladder walls.  The cystoscope will be removed and your bladder will be emptied. The procedure may vary among health care providers and hospitals. What happens after the procedure?  You may have some soreness or pain in your abdomen and urethra. Medicines will be available to help you.  You may have some blood in your urine.  Do not drive for 24 hours if you received a sedative. This information is not intended to replace advice given to you by your health care provider. Make sure you discuss any questions you have with your health care provider. Document Released: 11/30/2000 Document Revised: 04/12/2016 Document Reviewed: 10/20/2015 Elsevier Interactive Patient Education  2017 [place].  Cystoscopy, Care After Refer to this sheet in the next few weeks. These instructions provide you with information about caring for yourself after your procedure. Your health care provider may also give you more specific instructions. Your treatment has been planned according to current medical practices, but problems sometimes occur. Call your health care provider if you have any problems or questions after your procedure. What can I expect after the procedure? After the procedure, it is common to have:  Mild pain when you urinate. Pain should stop within a few minutes after you urinate. This may last for up to 1 week.  A small amount of blood in your urine for several days.  Feeling like you need to urinate but producing only a small amount of urine. Follow these instructions at home:   Medicines   Take over-the-counter and prescription medicines only as told by your health care provider.  If you were prescribed an antibiotic medicine, take it as told by your health care provider. Do not stop taking the antibiotic even if you start to feel better. General instructions      Return to your normal activities as told by your health care provider. Ask your health care provider what activities are safe for you.  Do not drive for 24 hours if you received a sedative.  Watch for any blood in your urine. If the amount of blood in your urine increases,  call your health care provider.  Follow instructions from your health care provider about eating or drinking restrictions.  If a tissue sample was removed for testing (biopsy) during your procedure, it is your responsibility to get your test results. Ask your health care provider or the department performing the test when your results will be ready.  Drink enough fluid to keep your urine clear or pale yellow.  Keep all follow-up visits as told by your health care provider. This is important. Contact a health care provider if:  You have pain that gets worse or does not get better with medicine, especially pain when you urinate.  You have difficulty urinating. Get help right away if:  You have more blood in your urine.  You have blood clots in your urine.  You have abdominal pain.  You have a fever or chills.  You are unable to urinate. This information is not intended to replace advice given to you by your health care provider. Make sure you discuss any questions you have with your health care provider. Document Released: 06/22/2005 Document Revised: 05/10/2016 Document Reviewed: 10/20/2015 Elsevier Interactive Patient Education  2017 [employer] PATIENT INSTRUCTIONS POST-ANESTHESIA  IMMEDIATELY FOLLOWING SURGERY:  Do not drive or operate machinery for the first twenty four hours after surgery.  Do not make any important decisions for twenty four hours after surgery or while taking narcotic pain medications or sedatives.  If you develop intractable nausea and vomiting or a severe headache please notify your doctor immediately.  FOLLOW-UP:  Please make an appointment with your surgeon as instructed. You  do not need to follow up with anesthesia unless specifically instructed to do so.  WOUND CARE INSTRUCTIONS (if applicable):  Keep a dry clean dressing on the anesthesia/puncture wound site if there is drainage.  Once the wound has quit draining you may leave it open to air.  Generally you should leave the bandage intact for twenty four hours unless there is drainage.  If the epidural site drains for more than 36-48 hours please call the anesthesia department.  QUESTIONS?:  Please feel free to call your physician or the hospital operator if you have any questions, and they will be happy to assist you.

## 2017-04-08 ENCOUNTER — Encounter (HOSPITAL_COMMUNITY): Payer: Self-pay

## 2017-04-08 ENCOUNTER — Encounter (HOSPITAL_COMMUNITY)
Admission: RE | Admit: 2017-04-08 | Discharge: 2017-04-08 | Disposition: A | Discharge status: Home / Self Care | Payer: Medicare Other | Source: Ambulatory Visit | Attending: Urology | Admitting: Urology

## 2017-04-08 DIAGNOSIS — M810 Age-related osteoporosis without current pathological fracture: Secondary | ICD-10-CM | POA: Diagnosis not present

## 2017-04-08 DIAGNOSIS — Z01812 Encounter for preprocedural laboratory examination: Secondary | ICD-10-CM

## 2017-04-08 DIAGNOSIS — Z87891 Personal history of nicotine dependence: Secondary | ICD-10-CM | POA: Diagnosis not present

## 2017-04-08 DIAGNOSIS — J439 Emphysema, unspecified: Secondary | ICD-10-CM | POA: Diagnosis not present

## 2017-04-08 DIAGNOSIS — Z882 Allergy status to sulfonamides status: Secondary | ICD-10-CM | POA: Diagnosis not present

## 2017-04-08 DIAGNOSIS — I10 Essential (primary) hypertension: Secondary | ICD-10-CM | POA: Diagnosis not present

## 2017-04-08 DIAGNOSIS — Z85048 Personal history of other malignant neoplasm of rectum, rectosigmoid junction, and anus: Secondary | ICD-10-CM | POA: Diagnosis not present

## 2017-04-08 DIAGNOSIS — K219 Gastro-esophageal reflux disease without esophagitis: Secondary | ICD-10-CM | POA: Diagnosis not present

## 2017-04-08 DIAGNOSIS — E039 Hypothyroidism, unspecified: Secondary | ICD-10-CM | POA: Diagnosis not present

## 2017-04-08 DIAGNOSIS — N135 Crossing vessel and stricture of ureter without hydronephrosis: Secondary | ICD-10-CM | POA: Diagnosis not present

## 2017-04-08 DIAGNOSIS — Z885 Allergy status to narcotic agent status: Secondary | ICD-10-CM | POA: Diagnosis not present

## 2017-04-08 DIAGNOSIS — M1990 Unspecified osteoarthritis, unspecified site: Secondary | ICD-10-CM | POA: Diagnosis not present

## 2017-04-08 DIAGNOSIS — Z87442 Personal history of urinary calculi: Secondary | ICD-10-CM | POA: Diagnosis not present

## 2017-04-08 DIAGNOSIS — E785 Hyperlipidemia, unspecified: Secondary | ICD-10-CM | POA: Diagnosis not present

## 2017-04-08 DIAGNOSIS — M419 Scoliosis, unspecified: Secondary | ICD-10-CM | POA: Diagnosis not present

## 2017-04-08 DIAGNOSIS — I2510 Atherosclerotic heart disease of native coronary artery without angina pectoris: Secondary | ICD-10-CM | POA: Diagnosis not present

## 2017-04-08 DIAGNOSIS — N289 Disorder of kidney and ureter, unspecified: Secondary | ICD-10-CM

## 2017-04-08 DIAGNOSIS — Z887 Allergy status to serum and vaccine status: Secondary | ICD-10-CM | POA: Diagnosis not present

## 2017-04-08 LAB — CBC WITH DIFFERENTIAL/PLATELET
Basophils Absolute: 0 10*3/uL (ref 0.0–0.1)
Basophils Relative: 0 %
EOS ABS: 0.1 10*3/uL (ref 0.0–0.7)
Eosinophils Relative: 2 %
HEMATOCRIT: 38.2 % (ref 36.0–46.0)
HEMOGLOBIN: 12 g/dL (ref 12.0–15.0)
LYMPHS ABS: 0.6 10*3/uL — AB (ref 0.7–4.0)
Lymphocytes Relative: 9 %
MCH: 27.1 pg (ref 26.0–34.0)
MCHC: 31.4 g/dL (ref 30.0–36.0)
MCV: 86.4 fL (ref 78.0–100.0)
Monocytes Absolute: 0.4 10*3/uL (ref 0.1–1.0)
Monocytes Relative: 6 %
NEUTROS ABS: 5.7 10*3/uL (ref 1.7–7.7)
NEUTROS PCT: 83 %
Platelets: 298 10*3/uL (ref 150–400)
RBC: 4.42 MIL/uL (ref 3.87–5.11)
RDW: 18.7 % — ABNORMAL HIGH (ref 11.5–15.5)
WBC: 6.9 10*3/uL (ref 4.0–10.5)

## 2017-04-08 LAB — BASIC METABOLIC PANEL
Anion gap: 6 (ref 5–15)
BUN: 12 mg/dL (ref 6–20)
CHLORIDE: 103 mmol/L (ref 101–111)
CO2: 28 mmol/L (ref 22–32)
CREATININE: 1.07 mg/dL — AB (ref 0.44–1.00)
Calcium: 10.2 mg/dL (ref 8.9–10.3)
GFR calc non Af Amer: 44 mL/min — ABNORMAL LOW (ref >60)
GFR, EST AFRICAN AMERICAN: 51 mL/min — AB (ref >60)
Glucose, Bld: 135 mg/dL — ABNORMAL HIGH (ref 65–99)
POTASSIUM: 4.3 mmol/L (ref 3.5–5.1)
SODIUM: 137 mmol/L (ref 135–145)

## 2017-04-10 ENCOUNTER — Ambulatory Visit (HOSPITAL_COMMUNITY): Payer: Medicare Other | Admitting: Anesthesiology

## 2017-04-10 ENCOUNTER — Encounter (HOSPITAL_COMMUNITY): Payer: Self-pay | Admitting: *Deleted

## 2017-04-10 ENCOUNTER — Encounter (HOSPITAL_COMMUNITY)
Admission: RE | Disposition: A | Discharge status: Home / Self Care | Payer: Self-pay | Source: Ambulatory Visit | Attending: Urology

## 2017-04-10 ENCOUNTER — Ambulatory Visit (HOSPITAL_COMMUNITY)
Admission: RE | Admit: 2017-04-10 | Discharge: 2017-04-10 | Disposition: A | Discharge status: Home / Self Care | Payer: Medicare Other | Source: Ambulatory Visit | Attending: Urology | Admitting: Urology

## 2017-04-10 ENCOUNTER — Ambulatory Visit (HOSPITAL_COMMUNITY): Payer: Medicare Other

## 2017-04-10 DIAGNOSIS — N135 Crossing vessel and stricture of ureter without hydronephrosis: Secondary | ICD-10-CM | POA: Insufficient documentation

## 2017-04-10 DIAGNOSIS — Z882 Allergy status to sulfonamides status: Secondary | ICD-10-CM | POA: Insufficient documentation

## 2017-04-10 DIAGNOSIS — J439 Emphysema, unspecified: Secondary | ICD-10-CM | POA: Insufficient documentation

## 2017-04-10 DIAGNOSIS — I10 Essential (primary) hypertension: Secondary | ICD-10-CM | POA: Diagnosis not present

## 2017-04-10 DIAGNOSIS — K219 Gastro-esophageal reflux disease without esophagitis: Secondary | ICD-10-CM | POA: Insufficient documentation

## 2017-04-10 DIAGNOSIS — E039 Hypothyroidism, unspecified: Secondary | ICD-10-CM | POA: Insufficient documentation

## 2017-04-10 DIAGNOSIS — Z885 Allergy status to narcotic agent status: Secondary | ICD-10-CM | POA: Diagnosis not present

## 2017-04-10 DIAGNOSIS — E785 Hyperlipidemia, unspecified: Secondary | ICD-10-CM | POA: Diagnosis not present

## 2017-04-10 DIAGNOSIS — M419 Scoliosis, unspecified: Secondary | ICD-10-CM | POA: Diagnosis not present

## 2017-04-10 DIAGNOSIS — I2510 Atherosclerotic heart disease of native coronary artery without angina pectoris: Secondary | ICD-10-CM | POA: Diagnosis not present

## 2017-04-10 DIAGNOSIS — M1990 Unspecified osteoarthritis, unspecified site: Secondary | ICD-10-CM | POA: Diagnosis not present

## 2017-04-10 DIAGNOSIS — D4112 Neoplasm of uncertain behavior of left renal pelvis: Secondary | ICD-10-CM | POA: Diagnosis not present

## 2017-04-10 DIAGNOSIS — Z87891 Personal history of nicotine dependence: Secondary | ICD-10-CM | POA: Insufficient documentation

## 2017-04-10 DIAGNOSIS — Z87442 Personal history of urinary calculi: Secondary | ICD-10-CM | POA: Insufficient documentation

## 2017-04-10 DIAGNOSIS — N2889 Other specified disorders of kidney and ureter: Secondary | ICD-10-CM | POA: Diagnosis not present

## 2017-04-10 DIAGNOSIS — Z887 Allergy status to serum and vaccine status: Secondary | ICD-10-CM | POA: Insufficient documentation

## 2017-04-10 DIAGNOSIS — M810 Age-related osteoporosis without current pathological fracture: Secondary | ICD-10-CM | POA: Diagnosis not present

## 2017-04-10 DIAGNOSIS — Z85048 Personal history of other malignant neoplasm of rectum, rectosigmoid junction, and anus: Secondary | ICD-10-CM | POA: Insufficient documentation

## 2017-04-10 HISTORY — PX: BALLOON DILATION: SHX5330

## 2017-04-10 HISTORY — PX: CYSTOSCOPY W/ URETERAL STENT PLACEMENT: SHX1429

## 2017-04-10 HISTORY — PX: CYSTOSCOPY/RETROGRADE/URETEROSCOPY: SHX5316

## 2017-04-10 HISTORY — PX: HOLMIUM LASER APPLICATION: SHX5852

## 2017-04-10 SURGERY — CYSTOSCOPY/RETROGRADE/URETEROSCOPY
Anesthesia: General | Site: Ureter | Laterality: Left

## 2017-04-10 MED ORDER — FENTANYL CITRATE (PF) 100 MCG/2ML IJ SOLN
INTRAMUSCULAR | Status: AC
  Filled 2017-04-10: qty 2

## 2017-04-10 MED ORDER — DIATRIZOATE MEGLUMINE 30 % UR SOLN
URETHRAL | Status: AC
  Filled 2017-04-10: qty 300

## 2017-04-10 MED ORDER — LIDOCAINE HCL (PF) 1 % IJ SOLN
INTRAMUSCULAR | Status: AC
  Filled 2017-04-10: qty 5

## 2017-04-10 MED ORDER — OXYBUTYNIN CHLORIDE ER 10 MG PO TB24: 10.0000 mg | ORAL_TABLET | Freq: Every morning | ORAL | 0 refills | Status: DC

## 2017-04-10 MED ORDER — PHENAZOPYRIDINE HCL 200 MG PO TABS: 200.0000 mg | ORAL_TABLET | Freq: Three times a day (TID) | ORAL | 0 refills | Status: DC | PRN

## 2017-04-10 MED ORDER — PROPOFOL 10 MG/ML IV BOLUS
INTRAVENOUS | Status: DC | PRN
  Administered 2017-04-10: 100 mg via INTRAVENOUS

## 2017-04-10 MED ORDER — MIDAZOLAM HCL 2 MG/2ML IJ SOLN
INTRAMUSCULAR | Status: AC
  Filled 2017-04-10: qty 2

## 2017-04-10 MED ORDER — LIDOCAINE HCL 1 % IJ SOLN
INTRAMUSCULAR | Status: DC | PRN
  Administered 2017-04-10: 20 mg via INTRADERMAL

## 2017-04-10 MED ORDER — FENTANYL CITRATE (PF) 100 MCG/2ML IJ SOLN: 25.0000 ug | INTRAMUSCULAR | Status: DC | PRN

## 2017-04-10 MED ORDER — LACTATED RINGERS IV SOLN
INTRAVENOUS | Status: DC
  Administered 2017-04-10: 12:00:00 via INTRAVENOUS

## 2017-04-10 MED ORDER — ONDANSETRON HCL 4 MG/2ML IJ SOLN
INTRAMUSCULAR | Status: AC
  Filled 2017-04-10: qty 2

## 2017-04-10 MED ORDER — SODIUM CHLORIDE 0.9 % IR SOLN
Status: DC | PRN
  Administered 2017-04-10: 5000 mL

## 2017-04-10 MED ORDER — DIATRIZOATE MEGLUMINE 30 % UR SOLN
URETHRAL | Status: DC | PRN
  Administered 2017-04-10: 7 mL via URETHRAL

## 2017-04-10 MED ORDER — FENTANYL CITRATE (PF) 100 MCG/2ML IJ SOLN
INTRAMUSCULAR | Status: DC | PRN
  Administered 2017-04-10 (×4): 25 ug via INTRAVENOUS

## 2017-04-10 MED ORDER — MIDAZOLAM HCL 2 MG/2ML IJ SOLN
1.0000 mg | INTRAMUSCULAR | Status: AC
  Administered 2017-04-10 (×2): 1 mg via INTRAVENOUS

## 2017-04-10 MED ORDER — DEXTROSE 5 % IV SOLN
1.0000 g | INTRAVENOUS | Status: AC
  Administered 2017-04-10: 1 g via INTRAVENOUS
  Filled 2017-04-10: qty 10

## 2017-04-10 MED ORDER — PROPOFOL 10 MG/ML IV BOLUS
INTRAVENOUS | Status: AC
  Filled 2017-04-10: qty 20

## 2017-04-10 MED ORDER — ONDANSETRON HCL 4 MG/2ML IJ SOLN
4.0000 mg | Freq: Once | INTRAMUSCULAR | Status: AC
  Administered 2017-04-10: 4 mg via INTRAVENOUS

## 2017-04-10 MED ORDER — HYDROCODONE-ACETAMINOPHEN 7.5-325 MG PO TABS: 1.0000 | ORAL_TABLET | ORAL | 0 refills | Status: DC | PRN

## 2017-04-10 SURGICAL SUPPLY — 24 items
BAG DRAIN URO TABLE W/ADPT NS (DRAPE) ×4 IMPLANT
BAG HAMPER (MISCELLANEOUS) ×4 IMPLANT
CATH INTERMIT  6FR 70CM (CATHETERS) IMPLANT
CLOTH BEACON ORANGE TIMEOUT ST (SAFETY) ×4 IMPLANT
EXTRACTOR STONE NITINOL NGAGE (UROLOGICAL SUPPLIES) IMPLANT
FIBER LASER FLEXIVA 200 (UROLOGICAL SUPPLIES) IMPLANT
FIBER LASER FLEXIVA 365 (UROLOGICAL SUPPLIES) IMPLANT
FIBER LASER TRAC TIP (UROLOGICAL SUPPLIES) ×4 IMPLANT
GLOVE BIO SURGEON STRL SZ8 (GLOVE) ×4 IMPLANT
GOWN STRL REUS W/ TWL LRG LVL3 (GOWN DISPOSABLE) ×2 IMPLANT
GOWN STRL REUS W/ TWL XL LVL3 (GOWN DISPOSABLE) ×2 IMPLANT
GOWN STRL REUS W/TWL LRG LVL3 (GOWN DISPOSABLE) ×2
GOWN STRL REUS W/TWL XL LVL3 (GOWN DISPOSABLE) ×2
GUIDEWIRE STR DUAL SENSOR (WIRE) ×4 IMPLANT
GUIDEWIRE STR ZIPWIRE 035X150 (MISCELLANEOUS) ×4 IMPLANT
IV NS IRRIG 3000ML ARTHROMATIC (IV SOLUTION) ×20 IMPLANT
KIT BALLIN UROMAX 15FX10 (LABEL) ×2 IMPLANT
KIT ROOM TURNOVER AP CYSTO (KITS) ×4 IMPLANT
MANIFOLD NEPTUNE II (INSTRUMENTS) ×4 IMPLANT
PACK CYSTO (CUSTOM PROCEDURE TRAY) ×4 IMPLANT
PAD ARMBOARD 7.5X6 YLW CONV (MISCELLANEOUS) ×4 IMPLANT
SET HIGH PRES BAL DIL (LABEL) ×2
SHEATH ACCESS URETERAL 38CM (SHEATH) ×4 IMPLANT
STENT CONTOUR 7FRX24 (STENTS) ×4 IMPLANT

## 2017-04-10 NOTE — Anesthesia Procedure Notes (Signed)
Procedure Name: LMA Insertion Date/Time: 04/10/2017 1:22 PM Performed by: Charmaine Downs Pre-anesthesia Checklist: Patient identified, Patient being monitored, Emergency Drugs available, Timeout performed and Suction available Patient Re-evaluated:Patient Re-evaluated prior to inductionOxygen Delivery Method: Circle System Utilized Preoxygenation: Pre-oxygenation with 100% oxygen Intubation Type: IV induction Ventilation: Mask ventilation without difficulty LMA: LMA inserted LMA Size: 4.0 Number of attempts: 1 Placement Confirmation: positive ETCO2 and breath sounds checked- equal and bilateral Tube secured with: Tape Dental Injury: Teeth and Oropharynx as per pre-operative assessment

## 2017-04-10 NOTE — Anesthesia Postprocedure Evaluation (Signed)
Anesthesia Post Note  Patient: Sherry [name]  Procedure(s) Performed: Procedure(s) (LRB): CYSTOSCOPY, LEFT RETROGRADE PYELOGRAM LEFT URETEROSCOPY (Left) HOLMIUM LASER ABLATION LEFT URETERAL TUMOR (Left) CYSTOSCOPY WITH STENT REPLACEMENT (Left) BALLOON DILATION OF LEFT URETERAL STRICTURE (Left)  Patient location during evaluation: PACU Anesthesia Type: General Level of consciousness: awake and patient cooperative Pain management: pain level controlled Vital Signs Assessment: post-procedure vital signs reviewed and stable Respiratory status: spontaneous breathing, nonlabored ventilation and respiratory function stable Cardiovascular status: blood pressure returned to baseline Postop Assessment: no signs of nausea or vomiting Anesthetic complications: no     Last Vitals:  Vitals:   04/10/17 1300 04/10/17 1430  BP:  (!) 157/142  Pulse:  61  Resp: (!) 30 12  Temp:      Last Pain: There were no vitals filed for this visit.               Jibri Schriefer J

## 2017-04-10 NOTE — Addendum Note (Signed)
Addendum  created 04/10/17 1437 by Charmaine Downs, CRNA   Anesthesia Intra Meds edited

## 2017-04-10 NOTE — H&P (Signed)
Urology Admission H&P  Chief Complaint: gross hematuria  History of Present Illness: Sherry [name] is a 83yo with a left renal pelvis/ureterla tumor here for ablation of the ureteral tumor.  Past Medical History:  Diagnosis Date  . Anemia   . Arthritis   . Bronchitis   . CAD (coronary artery disease)   . COPD (chronic obstructive pulmonary disease) ([place])   . Emphysema of lung ([place])   . GERD (gastroesophageal reflux disease)   . Headache(784.0)   . History of blood transfusion   . History of kidney stones   . Hyperlipidemia   . Hypertension   . Hypothyroidism   . Osteoporosis   . Rectal adenocarcinoma ([place]) dx'd 08/2009  . Rectal cancer ([place])   . Scoliosis    per Dr Melony Overly ortho  . Shingles   . Shortness of breath   . Tendon adhesions    torn tendon right shoulder   Past Surgical History:  Procedure Laterality Date  . APPENDECTOMY    . CARDIAC CATHETERIZATION    . CHOLECYSTECTOMY    . COLON REMOVAL     8 " 12/2009 DR TSUEI  . COLON SURGERY     apr for rectal cancer 2011  . COLONOSCOPY    . COLOSTOMY    . CYSTOSCOPY W/ URETERAL STENT PLACEMENT Left 03/18/2017   Procedure: CYSTOSCOPY WITH RETROGRADE PYELOGRAM/URETERAL STENT PLACEMENT AND URETERAL DIAGNOSTIC LEFT;  Surgeon: Cleon Gustin, MD;  Location: [facility];  Service: Urology;  Laterality: Left;  1 HR [phone] MEDICARE-[id number] [phone] - pt to arrive at 7:00 to receive blood  . CYSTOSCOPY WITH RETROGRADE PYELOGRAM, URETEROSCOPY AND STENT PLACEMENT Left 03/29/2016   Procedure: CYSTOSCOPY WITH RETROGRADE PYELOGRAM, URETEROSCOPY AND STENT PLACEMENT;  Surgeon: Kathie Rhodes, MD;  Location: [facility];  Service: Urology;  Laterality: Left;  With Laser  . CYSTOSCOPY/RETROGRADE/URETEROSCOPY Left 01/28/2017   Procedure: CYSTOSCOPY/RETROGRADE/URETEROSCOPY (FLEX 6 fr) THULIUM LASER OF TUMOR;  Surgeon: Franchot Gallo, MD;  Location: [facility];  Service: Urology;  Laterality: Left;  . EYE SURGERY     Cataract with  lens- bil  . OOPHORECTOMY    . ovary tumor removal     after son was born  . PARASTOMAL HERNIA REPAIR  10/30/2012  . POLYPECTOMY    . RECTUM REMOVAL     1/11 DR TSUEI  . TUBAL LIGATION    . VAGINAL HYSTERECTOMY    . VENTRAL HERNIA REPAIR  10/30/2012   Procedure: LAPAROSCOPIC VENTRAL HERNIA;  Surgeon: Imogene Burn. Georgette Dover, MD;  Location: [facility];  Service: General;  Laterality: N/A;  Laparoscopic repair of parastomal hernia    Home Medications:  Prescriptions Prior to Admission  Medication Sig Dispense Refill Last Dose  . acetaminophen (TYLENOL) 650 MG CR tablet Take 1,300 mg by mouth every 8 (eight) hours as needed for pain.   Past Week at Unknown time  . atorvastatin (LIPITOR) 10 MG tablet TAKE 1 TABLET DAILY (Patient taking differently: TAKE 1 TABLET DAILY IN THE EVENING) 90 tablet 2 04/09/2017 at Unknown time  . diltiazem (TIAZAC) 180 MG 24 hr capsule TAKE 1 CAPSULE DAILY (Patient taking differently: TAKE 1 CAPSULE DAILY IN THE MORNING) 90 capsule 3 04/10/2017 at Unknown time  . Docusate Calcium (STOOL SOFTENER PO) Take 2 tablets by mouth daily as needed (constipation).   04/09/2017 at Unknown time  . ferrous sulfate (SLOW RELEASE IRON) 160 (50 Fe) MG TBCR SR tablet Take 1 tablet by mouth daily after lunch.   04/09/2017 at Unknown time  .  levothyroxine (SYNTHROID, LEVOTHROID) 75 MCG tablet TAKE 1 TABLET DAILY (Patient taking differently: TAKE 1 TABLET DAILY IN THE MORNING) 90 tablet 2 04/10/2017 at Unknown time  . meclizine (ANTIVERT) 25 MG tablet Take 1 tablet (25 mg total) by mouth 3 (three) times daily as needed for dizziness. 30 tablet 1 04/09/2017 at Unknown time  . mirabegron ER (MYRBETRIQ) 50 MG TB24 tablet Take 50 mg by mouth every evening.    04/09/2017 at Unknown time  . oxybutynin (DITROPAN-XL) 10 MG 24 hr tablet TAKE 1 TABLET DAILY (Patient taking differently: TAKE 1 TABLET DAILY IN THE MORNING) 90 tablet 2 04/09/2017 at Unknown time  . PROAIR HFA 108 (90 Base) MCG/ACT inhaler USE 2  INHALATIONS INTO THE LUNGS EVERY 4 HOURS AS NEEDED FOR SHORTNESS OF BREATH 25.5 g 5 Past Week at Unknown time  . TOPROL XL 25 MG 24 hr tablet TAKE ONE-HALF (1/2) TABLET (12.5 MG TOTAL) DAILY (Patient taking differently: Take 12.5 mg by mouth every evening. TAKE ONE-HALF (1/2) TABLET (12.5 MG TOTAL) DAILY) 90 tablet 1 04/10/2017 at 630  . traMADol-acetaminophen (ULTRACET) 37.5-325 MG per tablet Take 1 tablet by mouth every 6 (six) hours as needed for moderate pain. (Patient taking differently: Take 1 tablet by mouth at bedtime as needed for moderate pain. ) 60 tablet 5 Past Month at Unknown time  . HYDROcodone-acetaminophen (NORCO) 7.5-325 MG tablet Take 1-2 tablets by mouth every 4 (four) hours as needed for moderate pain. Maximum dose per 24 hours - 8 pills (Patient not taking: Reported on 04/05/2017) 30 tablet 0 Not Taking at Unknown time  . phenazopyridine (PYRIDIUM) 200 MG tablet Take 1 tablet (200 mg total) by mouth 3 (three) times daily as needed for pain. (Patient not taking: Reported on 04/05/2017) 30 tablet 0 Not Taking at Unknown time   Allergies:  Allergies  Allergen Reactions  . Percocet [Oxycodone-Acetaminophen] Other (See Comments)    Upsets stomach  . Influenza Virus Vacc Split Pf Other (See Comments)    Unknown  But had to be hospitalized   . Sulfamethoxazole-Trimethoprim Nausea And Vomiting and Other (See Comments)  . Hibiclens [Chlorhexidine Gluconate] Rash    Family History  Problem Relation Age of Onset  . Heart failure Sister   . Cervical cancer Sister   . Heart failure Brother   . Colon cancer Brother   . Colon cancer Brother   . Heart failure Father   . Cancer Mother     HEAD AND NECK  . Cervical cancer Daughter   . Kidney cancer Daughter    Social History:  reports that she quit smoking about 13 years ago. Her smoking use included Cigarettes. She has a 97.50 pack-year smoking history. She has never used smokeless tobacco. She reports that she does not drink alcohol  or use drugs.  Review of Systems  Genitourinary: Positive for hematuria.  All other systems reviewed and are negative.   Physical Exam:  Vital signs in last 24 hours: Temp:  [98 F (36.7 C)] 98 F (36.7 C) (04/25 1211) Pulse Rate:  [61] 61 (04/25 1211) Resp:  [18] 18 (04/25 1227) BP: (144)/(80) 144/80 (04/25 1211) SpO2:  [96 %-100 %] 100 % (04/25 1227) Physical Exam  Constitutional: She is oriented to person, place, and time. She appears well-developed and well-nourished.  HENT:  Head: Normocephalic and atraumatic.  Eyes: EOM are normal. Pupils are equal, round, and reactive to light.  Neck: Normal range of motion. No thyromegaly present.  Cardiovascular: Normal rate and regular  rhythm.   Respiratory: Effort normal. No respiratory distress.  GI: Soft. She exhibits no distension.  Musculoskeletal: Normal range of motion. She exhibits no edema.  Neurological: She is alert and oriented to person, place, and time.  Skin: Skin is warm and dry.  Psychiatric: She has a normal mood and affect. Her behavior is normal. Judgment and thought content normal.    Laboratory Data:  No results found for this or any previous visit (from the past 24 hour(s)). No results found for this or any previous visit (from the past 240 hour(s)). Creatinine:  Recent Labs  04/08/17 1117  CREATININE 1.07*   Baseline Creatinine: 1  Impression/Assessment:  83yo with left ureteral tumor  Plan:  The risks/benefits/alternatives to let ureteroscopic tumor ablation was explained to the patient and she understands and wishes to proceed with surgery  Sherry [name] 04/10/2017, 12:50 PM

## 2017-04-10 NOTE — Transfer of Care (Signed)
Immediate Anesthesia Transfer of Care Note  Patient: Sherry [name]  Procedure(s) Performed: Procedure(s): CYSTOSCOPY, LEFT RETROGRADE PYELOGRAM LEFT URETEROSCOPY (Left) HOLMIUM LASER ABLATION LEFT URETERAL TUMOR (Left) CYSTOSCOPY WITH STENT REPLACEMENT (Left) BALLOON DILATION OF LEFT URETERAL STRICTURE (Left)  Patient Location: PACU  Anesthesia Type:General  Level of Consciousness: awake and patient cooperative  Airway & Oxygen Therapy: Patient Spontanous Breathing and Patient connected to face mask oxygen  Post-op Assessment: Report given to RN, Post -op Vital signs reviewed and stable and Patient moving all extremities  Post vital signs: Reviewed and stable  Last Vitals:  Vitals:   04/10/17 1255 04/10/17 1300  BP: 129/79   Pulse:    Resp: 16 (!) 30  Temp:      Last Pain: There were no vitals filed for this visit.    Patients Stated Pain Goal: 5 (34/37/35 7897)  Complications: No apparent anesthesia complications

## 2017-04-10 NOTE — Brief Op Note (Signed)
04/10/2017  2:15 PM  PATIENT:  Sherry [name]  83 y.o. female  PRE-OPERATIVE DIAGNOSIS:  LEFT URETERAL MASS  POST-OPERATIVE DIAGNOSIS:  LEFT URETERAL MASS  PROCEDURE:  Procedure(s): CYSTOSCOPY, LEFT RETROGRADE PYELOGRAM LEFT URETEROSCOPY (Left) HOLMIUM LASER ABLATION LEFT URETERAL TUMOR (Left) CYSTOSCOPY WITH STENT REPLACEMENT (Left) BALLOON DILATION OF LEFT URETERAL STRICTURE (Left)  SURGEON:  Surgeon(s) and Role:    * Cleon Gustin, MD - Primary  PHYSICIAN ASSISTANT:   ASSISTANTS: none   ANESTHESIA:   general  EBL:  Total I/O In: 600 [I.V.:600] Out: 0   BLOOD ADMINISTERED:none  DRAINS: left 7x24 JJ ureteral stent without tether   LOCAL MEDICATIONS USED:  NONE  SPECIMEN:  No Specimen  DISPOSITION OF SPECIMEN:  N/A  COUNTS:  YES  TOURNIQUET:  * No tourniquets in log *  DICTATION: .Note written in EPIC  PLAN OF CARE: Discharge to home after PACU  PATIENT DISPOSITION:  PACU - hemodynamically stable.   Delay start of Pharmacological VTE agent (>24hrs) due to surgical blood loss or risk of bleeding: not applicable

## 2017-04-10 NOTE — Discharge Instructions (Signed)
Ureteral Stent Implantation, Care After Refer to this sheet in the next few weeks. These instructions provide you with information about caring for yourself after your procedure. Your health care provider may also give you more specific instructions. Your treatment has been planned according to current medical practices, but problems sometimes occur. Call your health care provider if you have any problems or questions after your procedure. What can I expect after the procedure? After the procedure, it is common to have:  Nausea.  Mild pain when you urinate. You may feel this pain in your lower back or lower abdomen. Pain should stop within a few minutes after you urinate. This may last for up to 1 week.  A small amount of blood in your urine for several days. Follow these instructions at home:   Medicines   Take over-the-counter and prescription medicines only as told by your health care provider.  If you were prescribed an antibiotic medicine, take it as told by your health care provider. Do not stop taking the antibiotic even if you start to feel better.  Do not drive for 24 hours if you received a sedative.  Do not drive or operate heavy machinery while taking prescription pain medicines. Activity   Return to your normal activities as told by your health care provider. Ask your health care provider what activities are safe for you.  Do not lift anything that is heavier than 10 lb (4.5 kg). Follow this limit for 1 week after your procedure, or for as long as told by your health care provider. General instructions   Watch for any blood in your urine. Call your health care provider if the amount of blood in your urine increases.  If you have a catheter:  Follow instructions from your health care provider about taking care of your catheter and collection bag.  Do not take baths, swim, or use a hot tub until your health care provider approves.  Drink enough fluid to keep your urine  clear or pale yellow.  Keep all follow-up visits as told by your health care provider. This is important. Contact a health care provider if:  You have pain that gets worse or does not get better with medicine, especially pain when you urinate.  You have difficulty urinating.  You feel nauseous or you vomit repeatedly during a period of more than 2 days after the procedure. Get help right away if:  Your urine is dark red or has blood clots in it.  You are leaking urine (have incontinence).  The end of the stent comes out of your urethra.  You cannot urinate.  You have sudden, sharp, or severe pain in your abdomen or lower back.  You have a fever. This information is not intended to replace advice given to you by your health care provider. Make sure you discuss any questions you have with your health care provider. Document Released: 08/05/2013 Document Revised: 05/10/2016 Document Reviewed: 06/17/2015 Elsevier Interactive Patient Education  2017 [employer]   PATIENT INSTRUCTIONS POST-ANESTHESIA  IMMEDIATELY FOLLOWING SURGERY:  Do not drive or operate machinery for the first twenty four hours after surgery.  Do not make any important decisions for twenty four hours after surgery or while taking narcotic pain medications or sedatives.  If you develop intractable nausea and vomiting or a severe headache please notify your doctor immediately.  FOLLOW-UP:  Please make an appointment with your surgeon as instructed. You do not need to follow up with anesthesia unless specifically  instructed to do so.  WOUND CARE INSTRUCTIONS (if applicable):  Keep a dry clean dressing on the anesthesia/puncture wound site if there is drainage.  Once the wound has quit draining you may leave it open to air.  Generally you should leave the bandage intact for twenty four hours unless there is drainage.  If the epidural site drains for more than 36-48 hours please call the anesthesia  department.  QUESTIONS?:  Please feel free to call your physician or the hospital operator if you have any questions, and they will be happy to assist you.

## 2017-04-10 NOTE — Anesthesia Preprocedure Evaluation (Signed)
Anesthesia Evaluation  Patient identified by MRN, date of birth, ID band Patient awake    Reviewed: Allergy & Precautions, NPO status , Patient's Chart, lab work & pertinent test results  Airway Mallampati: II  TM Distance: >3 FB Neck ROM: full    Dental  (+) Edentulous Upper, Edentulous Lower   Pulmonary shortness of breath, COPD, former smoker,    breath sounds clear to auscultation       Cardiovascular hypertension, + CAD and + DOE   Rhythm:regular Rate:Normal     Neuro/Psych  Headaches,    GI/Hepatic GERD  Controlled,  Endo/Other  Hypothyroidism   Renal/GU Renal diseaseL renal tumor     Musculoskeletal  (+) Arthritis ,   Abdominal   Peds  Hematology  (+) anemia ,   Anesthesia Other Findings   Reproductive/Obstetrics                             Anesthesia Physical Anesthesia Plan  ASA: III  Anesthesia Plan: General   Post-op Pain Management:    Induction: Intravenous  Airway Management Planned: LMA  Additional Equipment:   Intra-op Plan:   Post-operative Plan: Extubation in OR  Informed Consent: I have reviewed the patients History and Physical, chart, labs and discussed the procedure including the risks, benefits and alternatives for the proposed anesthesia with the patient or authorized representative who has indicated his/her understanding and acceptance.     Plan Discussed with:   Anesthesia Plan Comments: (Plan to transfuse 1 PRBC prior to surgery.)        Anesthesia Quick Evaluation

## 2017-04-12 ENCOUNTER — Encounter (HOSPITAL_COMMUNITY): Payer: Self-pay | Admitting: Urology

## 2017-04-19 NOTE — Op Note (Signed)
.  Preoperative diagnosis: Left renal tumor  Postoperative diagnosis: left renal tumor and left proximal ureteral stricture  Procedure: 1 cystoscopy 2. Left retrograde pyelography 3.  Intraoperative fluoroscopy, under one hour, with interpretation 4.  Left ureteroscopic tumor ablation with laser 5.  Left 7 x 24 JJ stent placement 6. Balloon dilation of ureteral stricture  Attending: Rosie Fate  Anesthesia: General  Estimated blood loss: None  Drains: Left 7 x 24 JJ ureteral stent without tether  Specimens: none  Antibiotics: rocephin  Findings: left upper pole papillary tumor. No hydronephrosis. No masses/lesions in the bladder. Ureteral orifices in normal anatomic location. Left dense proximal ureteral stricture  Indications: Patient is a 83 year old female with a history of left ureteral tumor with persistent bleeding.  After discussing treatment options, she decided proceed with left ureteral ablation.  Procedure her in detail: The patient was brought to the operating room and a brief timeout was done to ensure correct patient, correct procedure, correct site.  General anesthesia was administered patient was placed in dorsal lithotomy position.  Her genitalia was then prepped and draped in usual sterile fashion.  A rigid 46 French cystoscope was passed in the urethra and the bladder.  Bladder was inspected free masses or lesions.  the ureteral orifices were in the normal orthotopic locations. Using a grasper the stent was brought to the urethral meatus. A zipwire was then advanced through the stent and up to the renal pelvis. The stent was then removed. a 6 french ureteral catheter was then instilled into the left ureteral orifice.  a gentle retrograde was obtained and findings noted above.  we then removed the cystoscope and cannulated the left ureteral orifice with a semirigid ureteroscope.  No stone was found in the ureter. Once we reached the proximal ureter we encountered a  dense stricture. We advanced a sensor wire passed the stricture and up to the renal pelvis. We then removed the scope and advanced a 4cm 15 french ureteral dilation balloon through the stricture. We then inflated the balloon to 16 cm of water and held it for 1 minute. We then deflated the balloon and advanced am 12/14 x 38cm access sheath up to the renal pelvis. We then used the flexible ureteroscope to perform nephroscopy. We encountered a papillary tumor involving the upper pole. Using a 200nm laser fiber we ablated the tumor and obtained hemostasis.   we then removed the access sheath under direct vision and noted no injury to the ureter. We then placed a 7 x 24 double-j ureteral stent over the original zip wire.  We then removed the wire and good coil was noted in the the renal pelvis under fluoroscopy and the bladder under direct vision. the bladder was then drained and this concluded the procedure which was well tolerated by patient.  Complications: None  Condition: Stable, extubated, transferred to PACU  Plan: Patient is to be discharged home as to follow-up in 2 weeks for stent removal.

## 2017-04-24 ENCOUNTER — Ambulatory Visit (INDEPENDENT_AMBULATORY_CARE_PROVIDER_SITE_OTHER): Payer: Medicare Other | Admitting: Urology

## 2017-04-24 DIAGNOSIS — C642 Malignant neoplasm of left kidney, except renal pelvis: Secondary | ICD-10-CM | POA: Diagnosis not present

## 2017-04-24 DIAGNOSIS — R3915 Urgency of urination: Secondary | ICD-10-CM | POA: Diagnosis not present

## 2017-04-25 ENCOUNTER — Other Ambulatory Visit: Payer: Self-pay | Admitting: Urology

## 2017-04-25 DIAGNOSIS — C642 Malignant neoplasm of left kidney, except renal pelvis: Secondary | ICD-10-CM

## 2017-05-08 ENCOUNTER — Ambulatory Visit (HOSPITAL_COMMUNITY)
Admission: RE | Admit: 2017-05-08 | Discharge: 2017-05-08 | Disposition: A | Discharge status: Home / Self Care | Payer: Medicare Other | Source: Ambulatory Visit | Attending: Urology | Admitting: Urology

## 2017-05-08 DIAGNOSIS — C642 Malignant neoplasm of left kidney, except renal pelvis: Secondary | ICD-10-CM | POA: Insufficient documentation

## 2017-05-08 DIAGNOSIS — N281 Cyst of kidney, acquired: Secondary | ICD-10-CM | POA: Diagnosis not present

## 2017-05-08 DIAGNOSIS — N1330 Unspecified hydronephrosis: Secondary | ICD-10-CM | POA: Diagnosis not present

## 2017-05-12 ENCOUNTER — Other Ambulatory Visit: Payer: Self-pay | Admitting: Internal Medicine

## 2017-05-14 ENCOUNTER — Encounter: Payer: Self-pay | Admitting: Internal Medicine

## 2017-05-14 ENCOUNTER — Ambulatory Visit (INDEPENDENT_AMBULATORY_CARE_PROVIDER_SITE_OTHER): Payer: Medicare Other | Admitting: Internal Medicine

## 2017-05-14 VITALS — BP 128/70 | HR 62 | Temp 98.2°F | Wt 121.6 lb

## 2017-05-14 DIAGNOSIS — N289 Disorder of kidney and ureter, unspecified: Secondary | ICD-10-CM | POA: Diagnosis not present

## 2017-05-14 DIAGNOSIS — I10 Essential (primary) hypertension: Secondary | ICD-10-CM

## 2017-05-14 NOTE — Progress Notes (Signed)
Subjective:    Patient ID: Sherry [name], female    DOB: [birth year], 83 y.o.   MRN: [id number]  HPI  83 year old patient who is seen today in follow-up.  She has essential hypertension.  She has been followed closely by urology due to a papillary transitional cell carcinoma involving her left kidney Doing quite well.  Her weight continues to fall somewhat She states her appetite has been poor since a remote colostomy. She has had no recent recurrent gross hematuria is scheduled to see urology in follow-up soon.  Most recent hemoglobin 12 point 0  Past Medical History:  Diagnosis Date  . Anemia   . Arthritis   . Bronchitis   . CAD (coronary artery disease)   . COPD (chronic obstructive pulmonary disease) ([place])   . Emphysema of lung ([place])   . GERD (gastroesophageal reflux disease)   . Headache(784.0)   . History of blood transfusion   . History of kidney stones   . Hyperlipidemia   . Hypertension   . Hypothyroidism   . Osteoporosis   . Rectal adenocarcinoma ([place]) dx'd 08/2009  . Rectal cancer ([place])   . Scoliosis    per Dr Melony Overly ortho  . Shingles   . Shortness of breath   . Tendon adhesions    torn tendon right shoulder     Social History   Social History  . Marital status: Married    Spouse name: N/A  . Number of children: 3  . Years of education: N/A   Occupational History  . Not on file.   Social History Main Topics  . Smoking status: Former Smoker    Packs/day: 1.50    Years: 65.00    Types: Cigarettes    Quit date: 12/18/2003  . Smokeless tobacco: Never Used     Comment: quit 2005  . Alcohol use No  . Drug use: No  . Sexual activity: No   Other Topics Concern  . Not on file   Social History Narrative  . No narrative on file    Past Surgical History:  Procedure Laterality Date  . APPENDECTOMY    . BALLOON DILATION Left 04/10/2017   Procedure: BALLOON DILATION OF LEFT URETERAL STRICTURE;  Surgeon: Cleon Gustin, MD;  Location: [facility];   Service: Urology;  Laterality: Left;  . CARDIAC CATHETERIZATION    . CHOLECYSTECTOMY    . COLON REMOVAL     8 " 12/2009 DR TSUEI  . COLON SURGERY     apr for rectal cancer 2011  . COLONOSCOPY    . COLOSTOMY    . CYSTOSCOPY W/ URETERAL STENT PLACEMENT Left 03/18/2017   Procedure: CYSTOSCOPY WITH RETROGRADE PYELOGRAM/URETERAL STENT PLACEMENT AND URETERAL DIAGNOSTIC LEFT;  Surgeon: Cleon Gustin, MD;  Location: [facility];  Service: Urology;  Laterality: Left;  1 HR [phone] [id number] A [phone] - pt to arrive at 7:00 to receive blood  . CYSTOSCOPY W/ URETERAL STENT PLACEMENT Left 04/10/2017   Procedure: CYSTOSCOPY WITH STENT REPLACEMENT;  Surgeon: Cleon Gustin, MD;  Location: [facility];  Service: Urology;  Laterality: Left;  . CYSTOSCOPY WITH RETROGRADE PYELOGRAM, URETEROSCOPY AND STENT PLACEMENT Left 03/29/2016   Procedure: CYSTOSCOPY WITH RETROGRADE PYELOGRAM, URETEROSCOPY AND STENT PLACEMENT;  Surgeon: Kathie Rhodes, MD;  Location: [facility];  Service: Urology;  Laterality: Left;  With Laser  . CYSTOSCOPY/RETROGRADE/URETEROSCOPY Left 01/28/2017   Procedure: CYSTOSCOPY/RETROGRADE/URETEROSCOPY (FLEX 6 fr) THULIUM LASER OF TUMOR;  Surgeon: Franchot Gallo, MD;  Location: [facility];  Service: Urology;  Laterality: Left;  . CYSTOSCOPY/RETROGRADE/URETEROSCOPY Left 04/10/2017   Procedure: CYSTOSCOPY, LEFT RETROGRADE PYELOGRAM LEFT URETEROSCOPY;  Surgeon: Cleon Gustin, MD;  Location: [facility];  Service: Urology;  Laterality: Left;  . EYE SURGERY     Cataract with lens- bil  . HOLMIUM LASER APPLICATION Left 4/31/5400   Procedure: HOLMIUM LASER ABLATION LEFT URETERAL TUMOR;  Surgeon: Cleon Gustin, MD;  Location: [facility];  Service: Urology;  Laterality: Left;  . OOPHORECTOMY    . ovary tumor removal     after son was born  . PARASTOMAL HERNIA REPAIR  10/30/2012  . POLYPECTOMY    . RECTUM REMOVAL     1/11 DR TSUEI  . TUBAL LIGATION    . VAGINAL HYSTERECTOMY    . VENTRAL  HERNIA REPAIR  10/30/2012   Procedure: LAPAROSCOPIC VENTRAL HERNIA;  Surgeon: Imogene Burn. Georgette Dover, MD;  Location: [facility];  Service: General;  Laterality: N/A;  Laparoscopic repair of parastomal hernia    Family History  Problem Relation Age of Onset  . Heart failure Sister   . Cervical cancer Sister   . Heart failure Brother   . Colon cancer Brother   . Colon cancer Brother   . Heart failure Father   . Cancer Mother        HEAD AND NECK  . Cervical cancer Daughter   . Kidney cancer Daughter     Allergies  Allergen Reactions  . Percocet [Oxycodone-Acetaminophen] Other (See Comments)    Upsets stomach  . Influenza Virus Vacc Split Pf Other (See Comments)    Unknown  But had to be hospitalized   . Sulfamethoxazole-Trimethoprim Nausea And Vomiting and Other (See Comments)  . Hibiclens [Chlorhexidine Gluconate] Rash    Current Outpatient Prescriptions on File Prior to Visit  Medication Sig Dispense Refill  . acetaminophen (TYLENOL) 650 MG CR tablet Take 1,300 mg by mouth every 8 (eight) hours as needed for pain.    Marland Kitchen atorvastatin (LIPITOR) 10 MG tablet TAKE 1 TABLET DAILY (Patient taking differently: TAKE 1 TABLET DAILY IN THE EVENING) 90 tablet 2  . diltiazem (TIAZAC) 180 MG 24 hr capsule TAKE 1 CAPSULE DAILY 90 capsule 3  . Docusate Calcium (STOOL SOFTENER PO) Take 2 tablets by mouth daily as needed (constipation).    . ferrous sulfate (SLOW RELEASE IRON) 160 (50 Fe) MG TBCR SR tablet Take 1 tablet by mouth daily after lunch.    Marland Kitchen HYDROcodone-acetaminophen (NORCO) 7.5-325 MG tablet Take 1-2 tablets by mouth every 4 (four) hours as needed for moderate pain. Maximum dose per 24 hours - 8 pills 30 tablet 0  . levothyroxine (SYNTHROID, LEVOTHROID) 75 MCG tablet TAKE 1 TABLET DAILY (Patient taking differently: TAKE 1 TABLET DAILY IN THE MORNING) 90 tablet 2  . meclizine (ANTIVERT) 25 MG tablet Take 1 tablet (25 mg total) by mouth 3 (three) times daily as needed for dizziness. 30 tablet 1    . oxybutynin (DITROPAN-XL) 10 MG 24 hr tablet Take 1 tablet (10 mg total) by mouth every morning. 30 tablet 0  . phenazopyridine (PYRIDIUM) 200 MG tablet Take 1 tablet (200 mg total) by mouth 3 (three) times daily as needed for pain. 30 tablet 0  . PROAIR HFA 108 (90 Base) MCG/ACT inhaler USE 2 INHALATIONS INTO THE LUNGS EVERY 4 HOURS AS NEEDED FOR SHORTNESS OF BREATH 25.5 g 5  . TOPROL XL 25 MG 24 hr tablet TAKE ONE-HALF (1/2) TABLET (12.5 MG TOTAL) DAILY (Patient taking differently: Take 12.5 mg by mouth  every evening. TAKE ONE-HALF (1/2) TABLET (12.5 MG TOTAL) DAILY) 90 tablet 1  . traMADol-acetaminophen (ULTRACET) 37.5-325 MG per tablet Take 1 tablet by mouth every 6 (six) hours as needed for moderate pain. (Patient taking differently: Take 1 tablet by mouth at bedtime as needed for moderate pain. ) 60 tablet 5   No current facility-administered medications on file prior to visit.     BP 128/70 (BP Location: Left Arm, Patient Position: Sitting, Cuff Size: Normal)   Pulse 62   Temp 98.2 F (36.8 C) (Oral)   Wt 121 lb 9.6 oz (55.2 kg)   SpO2 97%   BMI 21.89 kg/m     Review of Systems  Constitutional: Positive for appetite change and unexpected weight change.  HENT: Negative for congestion, dental problem, hearing loss, rhinorrhea, sinus pressure, sore throat and tinnitus.   Eyes: Negative for pain, discharge and visual disturbance.  Respiratory: Negative for cough and shortness of breath.   Cardiovascular: Negative for chest pain, palpitations and leg swelling.  Gastrointestinal: Negative for abdominal distention, abdominal pain, blood in stool, constipation, diarrhea, nausea and vomiting.  Genitourinary: Negative for difficulty urinating, dysuria, flank pain, frequency, hematuria, pelvic pain, urgency, vaginal bleeding, vaginal discharge and vaginal pain.  Musculoskeletal: Negative for arthralgias, gait problem and joint swelling.  Skin: Negative for rash.  Neurological: Negative  for dizziness, syncope, speech difficulty, weakness, numbness and headaches.  Hematological: Negative for adenopathy.  Psychiatric/Behavioral: Negative for agitation, behavioral problems and dysphoric mood. The patient is not nervous/anxious.        Objective:   Physical Exam  Constitutional: She is oriented to person, place, and time. She appears well-developed and well-nourished.  HENT:  Head: Normocephalic.  Right Ear: External ear normal.  Left Ear: External ear normal.  Mouth/Throat: Oropharynx is clear and moist.  Eyes: Conjunctivae and EOM are normal. Pupils are equal, round, and reactive to light.  Neck: Normal range of motion. Neck supple. No thyromegaly present.  Cardiovascular: Normal rate, regular rhythm and intact distal pulses.   Murmur heard. Slight irregularity  Pulmonary/Chest: Effort normal and breath sounds normal.  Abdominal: Soft. Bowel sounds are normal. She exhibits no mass. There is no tenderness.  colostomy  Musculoskeletal: Normal range of motion.  Lymphadenopathy:    She has no cervical adenopathy.  Neurological: She is alert and oriented to person, place, and time.  Skin: Skin is warm and dry. No rash noted.  Psychiatric: She has a normal mood and affect. Her behavior is normal.          Assessment & Plan:   History of gross hematuria secondary to left renal neoplasm.  Stable.  Follow-up urology History of anemia.  Stable  Dyslipidemia.  Continue statin therapy , hypertension, stable Hypothyroidism  Follow-up 4 months  [id number],PETER Pilar Plate

## 2017-05-14 NOTE — Patient Instructions (Addendum)
WE NOW OFFER   Sherry [name]'s FAST TRACK!!!  SAME DAY Appointments for ACUTE CARE  Such as: Sprains, Injuries, cuts, abrasions, rashes, muscle pain, joint pain, back pain Colds, flu, sore throats, headache, allergies, cough, fever  Ear pain, sinus and eye infections Abdominal pain, nausea, vomiting, diarrhea, upset stomach Animal/insect bites  3 Easy Ways to Schedule: Walk-In Scheduling Call in scheduling Mychart Sign-up: [email or web address]     Limit your sodium (Salt) intake  Return in 4 months for follow-up   Urology follow-up as scheduled

## 2017-05-15 ENCOUNTER — Ambulatory Visit (INDEPENDENT_AMBULATORY_CARE_PROVIDER_SITE_OTHER): Payer: Medicare Other | Admitting: Urology

## 2017-05-15 DIAGNOSIS — N131 Hydronephrosis with ureteral stricture, not elsewhere classified: Secondary | ICD-10-CM | POA: Diagnosis not present

## 2017-05-15 DIAGNOSIS — C642 Malignant neoplasm of left kidney, except renal pelvis: Secondary | ICD-10-CM

## 2017-05-16 ENCOUNTER — Other Ambulatory Visit: Payer: Self-pay | Admitting: Urology

## 2017-05-23 NOTE — Patient Instructions (Signed)
Sherry [name]  05/23/2017     @PREFPERIOPPHARMACY @   Your procedure is scheduled on  06/03/2017   Report to [facility] at  72  A.M.  Call this number if you have problems the morning of surgery:  [phone]   Remember:  Do not eat food or drink liquids after midnight.  Take these medicines the morning of surgery with A SIP OF WATER  Diltiazem, hydrocodone, levothyroxine, antivert, oxybutynin, pyridium, toprol XL.   Do not wear jewelry, make-up or nail polish.  Do not wear lotions, powders, or perfumes, or deoderant.  Do not shave 48 hours prior to surgery.  Men may shave face and neck.  Do not bring valuables to the hospital.  [facility] is not responsible for any belongings or valuables.  Contacts, dentures or bridgework may not be worn into surgery.  Leave your suitcase in the car.  After surgery it may be brought to your room.  For patients admitted to the hospital, discharge time will be determined by your treatment team.  Patients discharged the day of surgery will not be allowed to drive home.   Name and phone number of your driver:   daughter Special instructions:  None  Please read over the following fact sheets that you were given. Anesthesia Post-op Instructions and Care and Recovery After Surgery       Urethral Dilation Urethral dilation is a procedure to stretch open (dilate) the urethra. The urethra is the tube that carries urine from the bladder out of the body. In women, the urethra opens above the vaginal opening. In men, the urethra opens at the tip of the penis. Urethral dilation is usually done to treat narrowing of the urethra (urethral stricture), which can make it difficult to pass urine. Urethral dilation widens the urethra so that you can pass urine normally. Urethral dilation is done through the urethral opening. There are no incisions made during the procedure. Tell a health care provider about:  Any allergies you  have.  All medicines you are taking, including vitamins, herbs, eye drops, creams, and over-the-counter medicines.  Any problems you or family members have had with anesthetic medicines.  Any blood disorders you have.  Any surgeries you have had.  Any medical conditions you have.  Whether you are pregnant or may be pregnant. What are the risks? Generally, this is a safe procedure. However, problems may occur, including:  Bleeding.  Infection.  Allergic reactions to medicines.  Damage to the urethra, which may require reconstructive surgery.  A return of urethral stricture, which requires repeating the dilation procedure.  What happens before the procedure?  Follow instructions from your health care provider about eating or drinking restrictions.  Ask your health care provider about: ? Changing or stopping your regular medicines. This is especially important if you are taking diabetes medicines or blood thinners. ? Taking medicines such as aspirin and ibuprofen. These medicines can thin your blood. Do not take these medicines before your procedure if your health care provider instructs you not to.  You may be given antibiotic medicine to help prevent infection.  Ask your health care provider how your surgical site will be marked or identified.  Plan to have someone take you home after the procedure.  If you will be going home right after the procedure, plan to have someone stay with you for 24 hours. What happens during the  procedure?  To reduce your risk of infection: ? Your health care team will wash or sanitize their hands. ? Your genital area will be washed with soap.  An IV tube will be inserted into one of your veins.  You will be given one or more of the following: ? A medicine to help you relax (sedative). ? A medicine to make you fall asleep (general anesthetic). ? A medicine that is injected into your spine to numb the area below and slightly above the  injection site (spinal anesthetic).  Gel will be applied to numb and lubricate your urethral opening.  A thin tube with a light and camera on the end (cystoscope) will be inserted into your urethra.  Your urethra will be rinsed (irrigated) with a germ-free (sterile) water solution.  Narrow parts of your urethra will be stretched open using dilator rods (sounds). Your surgeon will start with very thin sounds, then use wider sounds as needed.  A thin tube with an inflatable balloon on the tip may be inserted into your urethra. The balloon may be inflated to help stretch your urethra open.  Your urethra will be irrigated. The procedure may vary among health care providers and hospitals. What happens after the procedure?  Your blood pressure, heart rate, breathing rate, and blood oxygen level will be monitored often until the medicines you were given have worn off.  You will be given medicine to control pain.  You may be taught to use a small, lubricated tube (catheter) at home to help keep your urethra open.  Do not drive for 24 hours if you received a sedative. This information is not intended to replace advice given to you by your health care provider. Make sure you discuss any questions you have with your health care provider. Document Released: 12/30/2015 Document Revised: 05/10/2016 Document Reviewed: 11/20/2015 Elsevier Interactive Patient Education  2018 [place].  Urethral Dilation, Care After Refer to this sheet in the next few weeks. These instructions provide you with information about caring for yourself after your procedure. Your health care provider may also give you more specific instructions. Your treatment has been planned according to current medical practices, but problems sometimes occur. Call your health care provider if you have any problems or questions after your procedure. What can I expect after the procedure? After the procedure, it is common to  have:  Burning pain when urinating.  Blood in your urine.  A need to urinate frequently.  Follow these instructions at home: Medicines  Take over-the-counter and prescription medicines only as told by your health care provider.  If you were prescribed antibiotic medicine, take it as told by your health care provider. Do not stop taking the antibiotic even if you start to feel better. Driving  Do not drive or operate heavy machinery while taking prescription pain medicine.  Do not drive for 24 hours if you received a medicine to help you relax (sedative) during your procedure. General instructions   If you were sent home with a catheter, follow your health care provider's instructions about how and when to use it.  Drink enough fluid to keep your urine clear or pale yellow.  Return to your normal activities as told by your health care provider. Ask your health care provider what activities are safe for you.  Keep all follow-up visits as told by your health care provider. This is important. Contact a health care provider if:  Your urine is cloudy and smells bad.  You  develop new bleeding when you urinate.  You pass blood clots when you urinate.  You have pain that does not get better with medicine. Get help right away if:  You develop new bleeding that does not stop.  You cannot pass urine.  You have a fever.  You have swelling, bruising, or discoloration of your genital area. This includes the penis, scrotum, and inner thighs for men, and the outer genital organs (vulva) and inner thighs for women. This information is not intended to replace advice given to you by your health care provider. Make sure you discuss any questions you have with your health care provider. Document Released: 12/30/2015 Document Revised: 05/10/2016 Document Reviewed: 11/20/2015 Elsevier Interactive Patient Education  2018 [employer].  Ureteral Stent Implantation Ureteral stent  implantation is a procedure to insert (implant) a flexible, soft, plastic tube (stent) into a tube (ureter) that drains urine from the kidneys. The stent supports the ureter while it heals and helps to drain urine from the kidneys. You may have a ureteral stent implanted after having a procedure to remove a blockage from the ureter (ureterolysis or pyeloplasty).You may also have a stent implanted to open the flow of urine when you have a blockage caused by a kidney stone, tumor, blood clot, or infection. You have two ureters, one on each side of the body. The ureters connect the kidneys to the organ that holds urine until it passes out of the body (bladder). The stent is placed so that one end is in the kidney, and one end is in the bladder. The stent is usually taken out after your ureter has healed. Depending on your condition, you may have a stent for just a few weeks, or you may have a long-term stent that will need to be replaced every few months. Tell a health care provider about:  Any allergies you have.  All medicines you are taking, including vitamins, herbs, eye drops, creams, and over-the-counter medicines.  Any problems you or family members have had with anesthetic medicines.  Any blood disorders you have.  Any surgeries you have had.  Any medical conditions you have.  Whether you are pregnant or may be pregnant. What are the risks? Generally, this is a safe procedure. However, problems may occur, including:  Infection.  Bleeding.  Allergic reactions to medicines.  Damage to other structures or organs. Tearing (perforation) of the ureter is possible.  Movement of the stent away from where it is placed during surgery (migration).  What happens before the procedure?  Ask your health care provider about: ? Changing or stopping your regular medicines. This is especially important if you are taking diabetes medicines or blood thinners. ? Taking medicines such as aspirin and  ibuprofen. These medicines can thin your blood. Do not take these medicines before your procedure if your health care provider instructs you not to.  Follow instructions from your health care provider about eating or drinking restrictions.  Do not drink alcohol and do not use any tobacco products before your procedure, as told by your health care provider.  You may be given antibiotic medicine to help prevent infection.  Plan to have someone take you home after the procedure.  If you go home right after the procedure, plan to have someone with you for 24 hours. What happens during the procedure?  An IV tube will be inserted into one of your veins.  You will be given a medicine to make you fall asleep (general anesthetic). You  may also be given a medicine to help you relax (sedative).  A thin, tube-shaped instrument with a light and tiny camera at the end (cystoscope) will be inserted into your urethra. The urethra is the tube that drains urine from the bladder out of the body. In men, the urethra opens at the end of the penis. In women, the urethra opens in front of the vaginal opening.  The cystoscope will be passed into your bladder.  A thin wire (guide wire) will be passed through your bladder and into your ureter. This is used to guide the stent into your ureter.  The stent will be inserted into your ureter.  The guide wire and the cystoscope will be removed.  A flexible tube (catheter) will be inserted through your urethra so that one end is in your bladder. This helps to drain urine from your bladder. The procedure may vary among hospitals and health care providers. What happens after the procedure?  Your blood pressure, heart rate, breathing rate, and blood oxygen level will be monitored often until the medicines you were given have worn off.  You may continue to receive medicine and fluids through an IV tube.  You may have some soreness or pain in your abdomen and urethra.  Medicines will be available to help you.  You will be encouraged to get up and walk around as soon as you can.  You will have a catheter draining your urine.  You will have some blood in your urine.  Do not drive for 24 hours if you received a sedative. This information is not intended to replace advice given to you by your health care provider. Make sure you discuss any questions you have with your health care provider. Document Released: 11/30/2000 Document Revised: 05/10/2016 Document Reviewed: 06/17/2015 Elsevier Interactive Patient Education  2018 [place].  Ureteral Stent Implantation, Care After Refer to this sheet in the next few weeks. These instructions provide you with information about caring for yourself after your procedure. Your health care provider may also give you more specific instructions. Your treatment has been planned according to current medical practices, but problems sometimes occur. Call your health care provider if you have any problems or questions after your procedure. What can I expect after the procedure? After the procedure, it is common to have:  Nausea.  Mild pain when you urinate. You may feel this pain in your lower back or lower abdomen. Pain should stop within a few minutes after you urinate. This may last for up to 1 week.  A small amount of blood in your urine for several days.  Follow these instructions at home:  Medicines  Take over-the-counter and prescription medicines only as told by your health care provider.  If you were prescribed an antibiotic medicine, take it as told by your health care provider. Do not stop taking the antibiotic even if you start to feel better.  Do not drive for 24 hours if you received a sedative.  Do not drive or operate heavy machinery while taking prescription pain medicines. Activity  Return to your normal activities as told by your health care provider. Ask your health care provider what activities  are safe for you.  Do not lift anything that is heavier than 10 lb (4.5 kg). Follow this limit for 1 week after your procedure, or for as long as told by your health care provider. General instructions  Watch for any blood in your urine. Call your health care provider  if the amount of blood in your urine increases.  If you have a catheter: ? Follow instructions from your health care provider about taking care of your catheter and collection bag. ? Do not take baths, swim, or use a hot tub until your health care provider approves.  Drink enough fluid to keep your urine clear or pale yellow.  Keep all follow-up visits as told by your health care provider. This is important. Contact a health care provider if:  You have pain that gets worse or does not get better with medicine, especially pain when you urinate.  You have difficulty urinating.  You feel nauseous or you vomit repeatedly during a period of more than 2 days after the procedure. Get help right away if:  Your urine is dark red or has blood clots in it.  You are leaking urine (have incontinence).  The end of the stent comes out of your urethra.  You cannot urinate.  You have sudden, sharp, or severe pain in your abdomen or lower back.  You have a fever. This information is not intended to replace advice given to you by your health care provider. Make sure you discuss any questions you have with your health care provider. Document Released: 08/05/2013 Document Revised: 05/10/2016 Document Reviewed: 06/17/2015 Elsevier Interactive Patient Education  [employer].  Cystoscopy Cystoscopy is a procedure that is used to help diagnose and sometimes treat conditions that affect that lower urinary tract. The lower urinary tract includes the bladder and the tube that drains urine from the bladder out of the body (urethra). Cystoscopy is performed with a thin, tube-shaped instrument with a light and camera at the end  (cystoscope). The cystoscope may be hard (rigid) or flexible, depending on the goal of the procedure.The cystoscope is inserted through the urethra, into the bladder. Cystoscopy may be recommended if you have:  Urinary tractinfections that keep coming back (recurring).  Blood in the urine (hematuria).  Loss of bladder control (urinary incontinence) or an overactive bladder.  Unusual cells found in a urine sample.  A blockage in the urethra.  Painful urination.  An abnormality in the bladder found during an intravenous pyelogram (IVP) or CT scan.  Cystoscopy may also be done to remove a sample of tissue to be examined under a microscope (biopsy). Tell a health care provider about:  Any allergies you have.  All medicines you are taking, including vitamins, herbs, eye drops, creams, and over-the-counter medicines.  Any problems you or family members have had with anesthetic medicines.  Any blood disorders you have.  Any surgeries you have had.  Any medical conditions you have.  Whether you are pregnant or may be pregnant. What are the risks? Generally, this is a safe procedure. However, problems may occur, including:  Infection.  Bleeding.  Allergic reactions to medicines.  Damage to other structures or organs.  What happens before the procedure?  Ask your health care provider about: ? Changing or stopping your regular medicines. This is especially important if you are taking diabetes medicines or blood thinners. ? Taking medicines such as aspirin and ibuprofen. These medicines can thin your blood. Do not take these medicines before your procedure if your health care provider instructs you not to.  Follow instructions from your health care provider about eating or drinking restrictions.  You may be given antibiotic medicine to help prevent infection.  You may have an exam or testing, such as X-rays of the bladder, urethra, or kidneys.  You  may have urine tests to  check for signs of infection.  Plan to have someone take you home after the procedure. What happens during the procedure?  To reduce your risk of infection,your health care team will wash or sanitize their hands.  You will be given one or more of the following: ? A medicine to help you relax (sedative). ? A medicine to numb the area (local anesthetic).  The area around the opening of your urethra will be cleaned.  The cystoscope will be passed through your urethra into your bladder.  Germ-free (sterile)fluid will flow through the cystoscope to fill your bladder. The fluid will stretch your bladder so that your surgeon can clearly examine your bladder walls.  The cystoscope will be removed and your bladder will be emptied. The procedure may vary among health care providers and hospitals. What happens after the procedure?  You may have some soreness or pain in your abdomen and urethra. Medicines will be available to help you.  You may have some blood in your urine.  Do not drive for 24 hours if you received a sedative. This information is not intended to replace advice given to you by your health care provider. Make sure you discuss any questions you have with your health care provider. Document Released: 11/30/2000 Document Revised: 04/12/2016 Document Reviewed: 10/20/2015 Elsevier Interactive Patient Education  2017 [place].  Cystoscopy, Care After Refer to this sheet in the next few weeks. These instructions provide you with information about caring for yourself after your procedure. Your health care provider may also give you more specific instructions. Your treatment has been planned according to current medical practices, but problems sometimes occur. Call your health care provider if you have any problems or questions after your procedure. What can I expect after the procedure? After the procedure, it is common to have:  Mild pain when you urinate. Pain should stop  within a few minutes after you urinate. This may last for up to 1 week.  A small amount of blood in your urine for several days.  Feeling like you need to urinate but producing only a small amount of urine.  Follow these instructions at home:  Medicines  Take over-the-counter and prescription medicines only as told by your health care provider.  If you were prescribed an antibiotic medicine, take it as told by your health care provider. Do not stop taking the antibiotic even if you start to feel better. General instructions   Return to your normal activities as told by your health care provider. Ask your health care provider what activities are safe for you.  Do not drive for 24 hours if you received a sedative.  Watch for any blood in your urine. If the amount of blood in your urine increases, call your health care provider.  Follow instructions from your health care provider about eating or drinking restrictions.  If a tissue sample was removed for testing (biopsy) during your procedure, it is your responsibility to get your test results. Ask your health care provider or the department performing the test when your results will be ready.  Drink enough fluid to keep your urine clear or pale yellow.  Keep all follow-up visits as told by your health care provider. This is important. Contact a health care provider if:  You have pain that gets worse or does not get better with medicine, especially pain when you urinate.  You have difficulty urinating. Get help right away if:  You have  more blood in your urine.  You have blood clots in your urine.  You have abdominal pain.  You have a fever or chills.  You are unable to urinate. This information is not intended to replace advice given to you by your health care provider. Make sure you discuss any questions you have with your health care provider. Document Released: 06/22/2005 Document Revised: 05/10/2016 Document Reviewed:  10/20/2015 Elsevier Interactive Patient Education  2017 [place] Anesthesia, Adult General anesthesia is the use of medicines to make a person "go to sleep" (be unconscious) for a medical procedure. General anesthesia is often recommended when a procedure:  Is long.  Requires you to be still or in an unusual position.  Is major and can cause you to lose blood.  Is impossible to do without general anesthesia.  The medicines used for general anesthesia are called general anesthetics. In addition to making you sleep, the medicines:  Prevent pain.  Control your blood pressure.  Relax your muscles.  Tell a health care provider about:  Any allergies you have.  All medicines you are taking, including vitamins, herbs, eye drops, creams, and over-the-counter medicines.  Any problems you or family members have had with anesthetic medicines.  Types of anesthetics you have had in the past.  Any bleeding disorders you have.  Any surgeries you have had.  Any medical conditions you have.  Any history of heart or lung conditions, such as heart failure, sleep apnea, or chronic obstructive pulmonary disease (COPD).  Whether you are pregnant or may be pregnant.  Whether you use tobacco, alcohol, marijuana, or street drugs.  Any history of Armed forces logistics/support/administrative officer.  Any history of depression or anxiety. What are the risks? Generally, this is a safe procedure. However, problems may occur, including:  Allergic reaction to anesthetics.  Lung and heart problems.  Inhaling food or liquids from your stomach into your lungs (aspiration).  Injury to nerves.  Waking up during your procedure and being unable to move (rare).  Extreme agitation or a state of mental confusion (delirium) when you wake up from the anesthetic.  Air in the bloodstream, which can lead to stroke.  These problems are more likely to develop if you are having a major surgery or if you have an advanced  medical condition. You can prevent some of these complications by answering all of your health care provider's questions thoroughly and by following all pre-procedure instructions. General anesthesia can cause side effects, including:  Nausea or vomiting  A sore throat from the breathing tube.  Feeling cold or shivery.  Feeling tired, washed out, or achy.  Sleepiness or drowsiness.  Confusion or agitation.  What happens before the procedure? Staying hydrated Follow instructions from your health care provider about hydration, which may include:  Up to 2 hours before the procedure - you may continue to drink clear liquids, such as water, clear fruit juice, black coffee, and plain tea.  Eating and drinking restrictions Follow instructions from your health care provider about eating and drinking, which may include:  8 hours before the procedure - stop eating heavy meals or foods such as meat, fried foods, or fatty foods.  6 hours before the procedure - stop eating light meals or foods, such as toast or cereal.  6 hours before the procedure - stop drinking milk or drinks that contain milk.  2 hours before the procedure - stop drinking clear liquids.  Medicines  Ask your health care provider about: ? Changing  or stopping your regular medicines. This is especially important if you are taking diabetes medicines or blood thinners. ? Taking medicines such as aspirin and ibuprofen. These medicines can thin your blood. Do not take these medicines before your procedure if your health care provider instructs you not to. ? Taking new dietary supplements or medicines. Do not take these during the week before your procedure unless your health care provider approves them.  If you are told to take a medicine or to continue taking a medicine on the day of the procedure, take the medicine with sips of water. General instructions   Ask if you will be going home the same day, the following day,  or after a longer hospital stay. ? Plan to have someone take you home. ? Plan to have someone stay with you for the first 24 hours after you leave the hospital or clinic.  For 3-6 weeks before the procedure, try not to use any tobacco products, such as cigarettes, chewing tobacco, and e-cigarettes.  You may brush your teeth on the morning of the procedure, but make sure to spit out the toothpaste. What happens during the procedure?  You will be given anesthetics through a mask and through an IV tube in one of your veins.  You may receive medicine to help you relax (sedative).  As soon as you are asleep, a breathing tube may be used to help you breathe.  An anesthesia specialist will stay with you throughout the procedure. He or she will help keep you comfortable and safe by continuing to give you medicines and adjusting the amount of medicine that you get. He or she will also watch your blood pressure, pulse, and oxygen levels to make sure that the anesthetics do not cause any problems.  If a breathing tube was used to help you breathe, it will be removed before you wake up. The procedure may vary among health care providers and hospitals. What happens after the procedure?  You will wake up, often slowly, after the procedure is complete, usually in a recovery area.  Your blood pressure, heart rate, breathing rate, and blood oxygen level will be monitored until the medicines you were given have worn off.  You may be given medicine to help you calm down if you feel anxious or agitated.  If you will be going home the same day, your health care provider may check to make sure you can stand, drink, and urinate.  Your health care providers will treat your pain and side effects before you go home.  Do not drive for 24 hours if you received a sedative.  You may: ? Feel nauseous and vomit. ? Have a sore throat. ? Have mental slowness. ? Feel cold or shivery. ? Feel sleepy. ? Feel  tired. ? Feel sore or achy, even in parts of your body where you did not have surgery. This information is not intended to replace advice given to you by your health care provider. Make sure you discuss any questions you have with your health care provider. Document Released: 03/11/2008 Document Revised: 05/15/2016 Document Reviewed: 11/17/2015 Elsevier Interactive Patient Education  2018 [place] Anesthesia, Adult, Care After These instructions provide you with information about caring for yourself after your procedure. Your health care provider may also give you more specific instructions. Your treatment has been planned according to current medical practices, but problems sometimes occur. Call your health care provider if you have any problems or questions after your procedure.  What can I expect after the procedure? After the procedure, it is common to have:  Vomiting.  A sore throat.  Mental slowness.  It is common to feel:  Nauseous.  Cold or shivery.  Sleepy.  Tired.  Sore or achy, even in parts of your body where you did not have surgery.  Follow these instructions at home: For at least 24 hours after the procedure:  Do not: ? Participate in activities where you could fall or become injured. ? Drive. ? Use heavy machinery. ? Drink alcohol. ? Take sleeping pills or medicines that cause drowsiness. ? Make important decisions or sign legal documents. ? Take care of children on your own.  Rest. Eating and drinking  If you vomit, drink water, juice, or soup when you can drink without vomiting.  Drink enough fluid to keep your urine clear or pale yellow.  Make sure you have little or no nausea before eating solid foods.  Follow the diet recommended by your health care provider. General instructions  Have a responsible adult stay with you until you are awake and alert.  Return to your normal activities as told by your health care provider. Ask your  health care provider what activities are safe for you.  Take over-the-counter and prescription medicines only as told by your health care provider.  If you smoke, do not smoke without supervision.  Keep all follow-up visits as told by your health care provider. This is important. Contact a health care provider if:  You continue to have nausea or vomiting at home, and medicines are not helpful.  You cannot drink fluids or start eating again.  You cannot urinate after 8-12 hours.  You develop a skin rash.  You have fever.  You have increasing redness at the site of your procedure. Get help right away if:  You have difficulty breathing.  You have chest pain.  You have unexpected bleeding.  You feel that you are having a life-threatening or urgent problem. This information is not intended to replace advice given to you by your health care provider. Make sure you discuss any questions you have with your health care provider. Document Released: 03/11/2001 Document Revised: 05/07/2016 Document Reviewed: 11/17/2015 Elsevier Interactive Patient Education  [employer].

## 2017-05-29 ENCOUNTER — Encounter (HOSPITAL_COMMUNITY)
Admission: RE | Admit: 2017-05-29 | Discharge: 2017-05-29 | Disposition: A | Discharge status: Home / Self Care | Payer: Medicare Other | Source: Ambulatory Visit | Attending: Urology | Admitting: Urology

## 2017-05-29 ENCOUNTER — Encounter (HOSPITAL_COMMUNITY): Payer: Self-pay

## 2017-05-29 DIAGNOSIS — Z01818 Encounter for other preprocedural examination: Secondary | ICD-10-CM | POA: Diagnosis not present

## 2017-05-29 LAB — CBC
HCT: 35.3 % — ABNORMAL LOW (ref 36.0–46.0)
HEMOGLOBIN: 11.3 g/dL — AB (ref 12.0–15.0)
MCH: 29.4 pg (ref 26.0–34.0)
MCHC: 32 g/dL (ref 30.0–36.0)
MCV: 91.9 fL (ref 78.0–100.0)
Platelets: 247 10*3/uL (ref 150–400)
RBC: 3.84 MIL/uL — ABNORMAL LOW (ref 3.87–5.11)
RDW: 24.6 % — AB (ref 11.5–15.5)
WBC: 4.3 10*3/uL (ref 4.0–10.5)

## 2017-05-29 LAB — BASIC METABOLIC PANEL
Anion gap: 7 (ref 5–15)
BUN: 14 mg/dL (ref 6–20)
CALCIUM: 10.1 mg/dL (ref 8.9–10.3)
CHLORIDE: 104 mmol/L (ref 101–111)
CO2: 27 mmol/L (ref 22–32)
CREATININE: 0.91 mg/dL (ref 0.44–1.00)
GFR calc Af Amer: >60 mL/min (ref >60)
GFR calc non Af Amer: 54 mL/min — ABNORMAL LOW (ref >60)
Glucose, Bld: 136 mg/dL — ABNORMAL HIGH (ref 65–99)
Potassium: 4.2 mmol/L (ref 3.5–5.1)
Sodium: 138 mmol/L (ref 135–145)

## 2017-06-03 ENCOUNTER — Ambulatory Visit (HOSPITAL_COMMUNITY): Payer: Medicare Other | Admitting: Anesthesiology

## 2017-06-03 ENCOUNTER — Ambulatory Visit (HOSPITAL_COMMUNITY)
Admission: RE | Admit: 2017-06-03 | Discharge: 2017-06-03 | Disposition: A | Discharge status: Home / Self Care | Payer: Medicare Other | Source: Ambulatory Visit | Attending: Urology | Admitting: Urology

## 2017-06-03 ENCOUNTER — Encounter (HOSPITAL_COMMUNITY)
Admission: RE | Disposition: A | Discharge status: Home / Self Care | Payer: Self-pay | Source: Ambulatory Visit | Attending: Urology

## 2017-06-03 ENCOUNTER — Ambulatory Visit (HOSPITAL_COMMUNITY): Payer: Medicare Other

## 2017-06-03 ENCOUNTER — Encounter (HOSPITAL_COMMUNITY): Payer: Self-pay

## 2017-06-03 DIAGNOSIS — E039 Hypothyroidism, unspecified: Secondary | ICD-10-CM | POA: Diagnosis not present

## 2017-06-03 DIAGNOSIS — Z885 Allergy status to narcotic agent status: Secondary | ICD-10-CM | POA: Insufficient documentation

## 2017-06-03 DIAGNOSIS — Z79899 Other long term (current) drug therapy: Secondary | ICD-10-CM | POA: Insufficient documentation

## 2017-06-03 DIAGNOSIS — Z883 Allergy status to other anti-infective agents status: Secondary | ICD-10-CM | POA: Diagnosis not present

## 2017-06-03 DIAGNOSIS — M1990 Unspecified osteoarthritis, unspecified site: Secondary | ICD-10-CM | POA: Diagnosis not present

## 2017-06-03 DIAGNOSIS — I2510 Atherosclerotic heart disease of native coronary artery without angina pectoris: Secondary | ICD-10-CM | POA: Diagnosis not present

## 2017-06-03 DIAGNOSIS — Z882 Allergy status to sulfonamides status: Secondary | ICD-10-CM | POA: Insufficient documentation

## 2017-06-03 DIAGNOSIS — I10 Essential (primary) hypertension: Secondary | ICD-10-CM | POA: Insufficient documentation

## 2017-06-03 DIAGNOSIS — Z85048 Personal history of other malignant neoplasm of rectum, rectosigmoid junction, and anus: Secondary | ICD-10-CM | POA: Diagnosis not present

## 2017-06-03 DIAGNOSIS — M810 Age-related osteoporosis without current pathological fracture: Secondary | ICD-10-CM | POA: Diagnosis not present

## 2017-06-03 DIAGNOSIS — D649 Anemia, unspecified: Secondary | ICD-10-CM | POA: Diagnosis not present

## 2017-06-03 DIAGNOSIS — Z8051 Family history of malignant neoplasm of kidney: Secondary | ICD-10-CM | POA: Insufficient documentation

## 2017-06-03 DIAGNOSIS — E785 Hyperlipidemia, unspecified: Secondary | ICD-10-CM | POA: Insufficient documentation

## 2017-06-03 DIAGNOSIS — Z887 Allergy status to serum and vaccine status: Secondary | ICD-10-CM | POA: Diagnosis not present

## 2017-06-03 DIAGNOSIS — Z87891 Personal history of nicotine dependence: Secondary | ICD-10-CM | POA: Insufficient documentation

## 2017-06-03 DIAGNOSIS — Z87442 Personal history of urinary calculi: Secondary | ICD-10-CM | POA: Diagnosis not present

## 2017-06-03 DIAGNOSIS — J439 Emphysema, unspecified: Secondary | ICD-10-CM | POA: Diagnosis not present

## 2017-06-03 DIAGNOSIS — M419 Scoliosis, unspecified: Secondary | ICD-10-CM | POA: Diagnosis not present

## 2017-06-03 DIAGNOSIS — N135 Crossing vessel and stricture of ureter without hydronephrosis: Secondary | ICD-10-CM | POA: Diagnosis not present

## 2017-06-03 DIAGNOSIS — N131 Hydronephrosis with ureteral stricture, not elsewhere classified: Secondary | ICD-10-CM | POA: Diagnosis not present

## 2017-06-03 HISTORY — PX: CYSTOSCOPY W/ URETERAL STENT PLACEMENT: SHX1429

## 2017-06-03 SURGERY — CYSTOSCOPY, WITH RETROGRADE PYELOGRAM AND URETERAL STENT INSERTION
Anesthesia: General | Laterality: Left

## 2017-06-03 MED ORDER — LIDOCAINE HCL (PF) 1 % IJ SOLN
INTRAMUSCULAR | Status: AC
  Filled 2017-06-03: qty 5

## 2017-06-03 MED ORDER — DIATRIZOATE MEGLUMINE 30 % UR SOLN
URETHRAL | Status: DC | PRN
  Administered 2017-06-03: 20 mL via URETHRAL

## 2017-06-03 MED ORDER — GLYCOPYRROLATE 0.2 MG/ML IJ SOLN
0.2000 mg | Freq: Once | INTRAMUSCULAR | Status: AC
  Administered 2017-06-03: 0.2 mg via INTRAVENOUS

## 2017-06-03 MED ORDER — PROPOFOL 10 MG/ML IV BOLUS
INTRAVENOUS | Status: AC
  Filled 2017-06-03: qty 20

## 2017-06-03 MED ORDER — GLYCOPYRROLATE 0.2 MG/ML IJ SOLN
INTRAMUSCULAR | Status: AC
  Filled 2017-06-03: qty 1

## 2017-06-03 MED ORDER — LACTATED RINGERS IV SOLN
INTRAVENOUS | Status: DC
  Administered 2017-06-03: 12:00:00 via INTRAVENOUS

## 2017-06-03 MED ORDER — MIDAZOLAM HCL 2 MG/2ML IJ SOLN
1.0000 mg | INTRAMUSCULAR | Status: AC
  Administered 2017-06-03: 2 mg via INTRAVENOUS

## 2017-06-03 MED ORDER — MIDAZOLAM HCL 2 MG/2ML IJ SOLN
INTRAMUSCULAR | Status: AC
  Filled 2017-06-03: qty 2

## 2017-06-03 MED ORDER — ONDANSETRON HCL 4 MG/2ML IJ SOLN
INTRAMUSCULAR | Status: AC
  Filled 2017-06-03: qty 2

## 2017-06-03 MED ORDER — HYDROCODONE-ACETAMINOPHEN 7.5-325 MG PO TABS: 1.0000 | ORAL_TABLET | ORAL | 0 refills | Status: DC | PRN

## 2017-06-03 MED ORDER — PROPOFOL 10 MG/ML IV BOLUS
INTRAVENOUS | Status: DC | PRN
  Administered 2017-06-03: 120 mg via INTRAVENOUS

## 2017-06-03 MED ORDER — ONDANSETRON HCL 4 MG/2ML IJ SOLN
4.0000 mg | Freq: Once | INTRAMUSCULAR | Status: AC
  Administered 2017-06-03: 4 mg via INTRAVENOUS

## 2017-06-03 MED ORDER — CEFAZOLIN SODIUM-DEXTROSE 2-4 GM/100ML-% IV SOLN
2.0000 g | INTRAVENOUS | Status: AC
  Administered 2017-06-03: 2 g via INTRAVENOUS
  Filled 2017-06-03: qty 100

## 2017-06-03 MED ORDER — PHENAZOPYRIDINE HCL 200 MG PO TABS: 200.0000 mg | ORAL_TABLET | Freq: Three times a day (TID) | ORAL | 0 refills | Status: DC | PRN

## 2017-06-03 MED ORDER — FENTANYL CITRATE (PF) 100 MCG/2ML IJ SOLN
INTRAMUSCULAR | Status: DC | PRN
  Administered 2017-06-03: 50 ug via INTRAVENOUS
  Administered 2017-06-03 (×2): 25 ug via INTRAVENOUS

## 2017-06-03 MED ORDER — FENTANYL CITRATE (PF) 100 MCG/2ML IJ SOLN
INTRAMUSCULAR | Status: AC
  Filled 2017-06-03: qty 2

## 2017-06-03 MED ORDER — FENTANYL CITRATE (PF) 100 MCG/2ML IJ SOLN: 25.0000 ug | INTRAMUSCULAR | Status: DC | PRN

## 2017-06-03 MED ORDER — DIATRIZOATE MEGLUMINE 30 % UR SOLN
URETHRAL | Status: AC
  Filled 2017-06-03: qty 300

## 2017-06-03 MED ORDER — SODIUM CHLORIDE 0.9 % IR SOLN
Status: DC | PRN
  Administered 2017-06-03: 3000 mL

## 2017-06-03 SURGICAL SUPPLY — 21 items
BAG DRAIN URO TABLE W/ADPT NS (DRAPE) ×3 IMPLANT
BAG HAMPER (MISCELLANEOUS) ×3 IMPLANT
CATH INTERMIT  6FR 70CM (CATHETERS) ×3 IMPLANT
CLOTH BEACON ORANGE TIMEOUT ST (SAFETY) ×3 IMPLANT
DECANTER SPIKE VIAL GLASS SM (MISCELLANEOUS) ×3 IMPLANT
DEVICE INFLATION ENCORE 26 (MISCELLANEOUS) ×3 IMPLANT
DILATOR UROMAX ULTRA (MISCELLANEOUS) ×3 IMPLANT
GLOVE BIO SURGEON STRL SZ8 (GLOVE) ×3 IMPLANT
GOWN STRL REUS W/ TWL XL LVL3 (GOWN DISPOSABLE) ×1 IMPLANT
GOWN STRL REUS W/TWL LRG LVL3 (GOWN DISPOSABLE) ×3 IMPLANT
GOWN STRL REUS W/TWL XL LVL3 (GOWN DISPOSABLE) ×2
GUIDEWIRE STR DUAL SENSOR (WIRE) IMPLANT
GUIDEWIRE STR ZIPWIRE 035X150 (MISCELLANEOUS) ×3 IMPLANT
IV NS IRRIG 3000ML ARTHROMATIC (IV SOLUTION) ×3 IMPLANT
KIT ROOM TURNOVER AP CYSTO (KITS) ×3 IMPLANT
MANIFOLD NEPTUNE II (INSTRUMENTS) ×3 IMPLANT
PACK CYSTO (CUSTOM PROCEDURE TRAY) ×3 IMPLANT
PAD ARMBOARD 7.5X6 YLW CONV (MISCELLANEOUS) ×3 IMPLANT
STENT CONTOUR 7FRX24 (STENTS) ×3 IMPLANT
SYRINGE 10CC LL (SYRINGE) ×3 IMPLANT
TOWEL OR 17X26 4PK STRL BLUE (TOWEL DISPOSABLE) ×3 IMPLANT

## 2017-06-03 NOTE — Transfer of Care (Signed)
Immediate Anesthesia Transfer of Care Note  Patient: Sherry [name]  Procedure(s) Performed: Procedure(s): CYSTOSCOPY WITH LEFT RETROGRADE PYELOGRAM/LEFT URETERAL  BALLOON DILATION AND LEFT URETERAL STENT PLACEMENT (Left)  Patient Location: PACU  Anesthesia Type:General  Level of Consciousness: awake, oriented, drowsy and patient cooperative  Airway & Oxygen Therapy: Patient Spontanous Breathing and Patient connected to face mask oxygen  Post-op Assessment: Report given to RN, Post -op Vital signs reviewed and stable and Patient moving all extremities  Post vital signs: Reviewed and stable  Last Vitals:  Vitals:   06/03/17 1002  BP: (!) 172/89  Pulse: 68  Resp: 18  Temp: 37 C    Last Pain:  Vitals:   06/03/17 1002  TempSrc: Oral  PainSc: 5       Patients Stated Pain Goal: 4 ([id number])  Complications: No apparent anesthesia complications

## 2017-06-03 NOTE — Discharge Instructions (Signed)

## 2017-06-03 NOTE — Anesthesia Postprocedure Evaluation (Signed)
Anesthesia Post Note  Patient: Sherry [name]  Procedure(s) Performed: Procedure(s) (LRB): CYSTOSCOPY WITH LEFT RETROGRADE PYELOGRAM/LEFT URETERAL  BALLOON DILATION AND LEFT URETERAL STENT PLACEMENT (Left)  Patient location during evaluation: PACU Anesthesia Type: General Level of consciousness: awake and alert, oriented and patient cooperative Pain management: pain level controlled Vital Signs Assessment: post-procedure vital signs reviewed and stable Respiratory status: patient connected to face mask oxygen Cardiovascular status: stable Postop Assessment: no signs of nausea or vomiting Anesthetic complications: no     Last Vitals:  Vitals:   06/03/17 1002 06/03/17 1353  BP: (!) 172/89 (!) 152/96  Pulse: 68 81  Resp: 18 13  Temp: 37 C 36.6 C    Last Pain:  Vitals:   06/03/17 1353  TempSrc:   PainSc: 0-No pain                 Enya Bureau A

## 2017-06-03 NOTE — Brief Op Note (Signed)
06/03/2017  1:41 PM  PATIENT:  Sherry [name]  83 y.o. female  PRE-OPERATIVE DIAGNOSIS:  LEFT URETERAL STRICTURE  POST-OPERATIVE DIAGNOSIS:  LEFT URETERAL STRICTURE  PROCEDURE:  Procedure(s): CYSTOSCOPY WITH LEFT RETROGRADE PYELOGRAM/LEFT URETERAL  BALLOON DILATION AND LEFT URETERAL STENT PLACEMENT (Left)  SURGEON:  Surgeon(s) and Role:    * McKenzie, Candee Furbish, MD - Primary  PHYSICIAN ASSISTANT:   ASSISTANTS: none   ANESTHESIA:   general  EBL:  No intake/output data recorded.  BLOOD ADMINISTERED:none  DRAINS: left 7x24 JJ ureteral stent   LOCAL MEDICATIONS USED:  NONE  SPECIMEN:  No Specimen  DISPOSITION OF SPECIMEN:  N/A  COUNTS:  YES  TOURNIQUET:  * No tourniquets in log *  DICTATION: .Note written in EPIC  PLAN OF CARE: Discharge to home after PACU  PATIENT DISPOSITION:  PACU - hemodynamically stable.   Delay start of Pharmacological VTE agent (>24hrs) due to surgical blood loss or risk of bleeding: not applicable

## 2017-06-03 NOTE — Anesthesia Procedure Notes (Signed)
Procedure Name: LMA Insertion Date/Time: 06/03/2017 1:21 PM Performed by: Lajuana Carry E Pre-anesthesia Checklist: Patient identified, Emergency Drugs available, Suction available, Patient being monitored and Timeout performed Patient Re-evaluated:Patient Re-evaluated prior to inductionOxygen Delivery Method: Circle system utilized Preoxygenation: Pre-oxygenation with 100% oxygen Intubation Type: IV induction Ventilation: Mask ventilation without difficulty LMA: LMA inserted LMA Size: 3.0 Number of attempts: 1 Placement Confirmation: positive ETCO2 and breath sounds checked- equal and bilateral Dental Injury: Teeth and Oropharynx as per pre-operative assessment

## 2017-06-03 NOTE — Anesthesia Preprocedure Evaluation (Signed)
Anesthesia Evaluation  Patient identified by MRN, date of birth, ID band Patient awake    Reviewed: Allergy & Precautions, NPO status , Patient's Chart, lab work & pertinent test results  Airway Mallampati: II  TM Distance: >3 FB Neck ROM: full    Dental  (+) Edentulous Upper, Edentulous Lower   Pulmonary shortness of breath, COPD, former smoker,    breath sounds clear to auscultation       Cardiovascular hypertension, + CAD and + DOE   Rhythm:regular Rate:Normal     Neuro/Psych  Headaches,    GI/Hepatic GERD  Controlled,  Endo/Other  Hypothyroidism   Renal/GU Renal diseaseL renal tumor     Musculoskeletal  (+) Arthritis ,   Abdominal   Peds  Hematology  (+) anemia ,   Anesthesia Other Findings   Reproductive/Obstetrics                             Anesthesia Physical Anesthesia Plan  ASA: III  Anesthesia Plan: General   Post-op Pain Management:    Induction: Intravenous  PONV Risk Score and Plan:   Airway Management Planned: LMA  Additional Equipment:   Intra-op Plan:   Post-operative Plan: Extubation in OR  Informed Consent: I have reviewed the patients History and Physical, chart, labs and discussed the procedure including the risks, benefits and alternatives for the proposed anesthesia with the patient or authorized representative who has indicated his/her understanding and acceptance.     Plan Discussed with:   Anesthesia Plan Comments: (Plan to transfuse 1 PRBC prior to surgery.)        Anesthesia Quick Evaluation  

## 2017-06-03 NOTE — H&P (Signed)
Urology Admission H&P  Chief Complaint: left flank pain  History of Present Illness: Sherry [name] is a 83yo with recurrent left ureteral stricture.   Past Medical History:  Diagnosis Date  . Anemia   . Arthritis   . Bronchitis   . CAD (coronary artery disease)   . COPD (chronic obstructive pulmonary disease) ([place])   . Emphysema of lung ([place])   . GERD (gastroesophageal reflux disease)   . Headache(784.0)   . History of blood transfusion   . History of kidney stones   . Hyperlipidemia   . Hypertension   . Hypothyroidism   . Osteoporosis   . Rectal adenocarcinoma ([place]) dx'd 08/2009  . Rectal cancer ([place])   . Scoliosis    per Dr Melony Overly ortho  . Shingles   . Shortness of breath   . Tendon adhesions    torn tendon right shoulder   Past Surgical History:  Procedure Laterality Date  . APPENDECTOMY    . BALLOON DILATION Left 04/10/2017   Procedure: BALLOON DILATION OF LEFT URETERAL STRICTURE;  Surgeon: Cleon Gustin, MD;  Location: [facility];  Service: Urology;  Laterality: Left;  . CARDIAC CATHETERIZATION    . CHOLECYSTECTOMY    . COLON REMOVAL     8 " 12/2009 DR TSUEI  . COLON SURGERY     apr for rectal cancer 2011  . COLONOSCOPY    . COLOSTOMY    . CYSTOSCOPY W/ URETERAL STENT PLACEMENT Left 03/18/2017   Procedure: CYSTOSCOPY WITH RETROGRADE PYELOGRAM/URETERAL STENT PLACEMENT AND URETERAL DIAGNOSTIC LEFT;  Surgeon: Cleon Gustin, MD;  Location: [facility];  Service: Urology;  Laterality: Left;  1 HR [id number] [id number] [phone] - pt to arrive at 7:00 to receive blood  . CYSTOSCOPY W/ URETERAL STENT PLACEMENT Left 04/10/2017   Procedure: CYSTOSCOPY WITH STENT REPLACEMENT;  Surgeon: Cleon Gustin, MD;  Location: [facility];  Service: Urology;  Laterality: Left;  . CYSTOSCOPY WITH RETROGRADE PYELOGRAM, URETEROSCOPY AND STENT PLACEMENT Left 03/29/2016   Procedure: CYSTOSCOPY WITH RETROGRADE PYELOGRAM, URETEROSCOPY AND STENT PLACEMENT;  Surgeon: Kathie Rhodes, MD;  Location: [facility];  Service: Urology;  Laterality: Left;  With Laser  . CYSTOSCOPY/RETROGRADE/URETEROSCOPY Left 01/28/2017   Procedure: CYSTOSCOPY/RETROGRADE/URETEROSCOPY (FLEX 6 fr) THULIUM LASER OF TUMOR;  Surgeon: Franchot Gallo, MD;  Location: [facility];  Service: Urology;  Laterality: Left;  . CYSTOSCOPY/RETROGRADE/URETEROSCOPY Left 04/10/2017   Procedure: CYSTOSCOPY, LEFT RETROGRADE PYELOGRAM LEFT URETEROSCOPY;  Surgeon: Cleon Gustin, MD;  Location: [facility];  Service: Urology;  Laterality: Left;  . EYE SURGERY     Cataract with lens- bil  . HOLMIUM LASER APPLICATION Left 2/42/3536   Procedure: HOLMIUM LASER ABLATION LEFT URETERAL TUMOR;  Surgeon: Cleon Gustin, MD;  Location: [facility];  Service: Urology;  Laterality: Left;  . OOPHORECTOMY    . ovary tumor removal     after son was born  . PARASTOMAL HERNIA REPAIR  10/30/2012  . POLYPECTOMY    . RECTUM REMOVAL     1/11 DR TSUEI  . TUBAL LIGATION    . VAGINAL HYSTERECTOMY    . VENTRAL HERNIA REPAIR  10/30/2012   Procedure: LAPAROSCOPIC VENTRAL HERNIA;  Surgeon: Imogene Burn. Georgette Dover, MD;  Location: [facility];  Service: General;  Laterality: N/A;  Laparoscopic repair of parastomal hernia    Home Medications:  Prescriptions Prior to Admission  Medication Sig Dispense Refill Last Dose  . acetaminophen (TYLENOL) 650 MG CR tablet Take 1,300 mg by mouth every 8 (eight) hours as needed  for pain.   Past Week at Unknown time  . atorvastatin (LIPITOR) 10 MG tablet TAKE 1 TABLET DAILY (Patient taking differently: TAKE 1 TABLET DAILY IN THE EVENING) 90 tablet 2 06/02/2017 at Unknown time  . diltiazem (TIAZAC) 180 MG 24 hr capsule TAKE 1 CAPSULE DAILY 90 capsule 3 06/03/2017 at Unknown time  . docusate sodium (COLACE) 100 MG capsule Take 200 mg by mouth daily as needed for mild constipation.   06/02/2017 at Unknown time  . ferrous sulfate (SLOW RELEASE IRON) 160 (50 Fe) MG TBCR SR tablet Take 160 mg by mouth daily after lunch.    06/02/2017  at Unknown time  . levothyroxine (SYNTHROID, LEVOTHROID) 75 MCG tablet TAKE 1 TABLET DAILY (Patient taking differently: TAKE 1 TABLET DAILY IN THE MORNING) 90 tablet 2 06/03/2017 at Unknown time  . meclizine (ANTIVERT) 25 MG tablet Take 1 tablet (25 mg total) by mouth 3 (three) times daily as needed for dizziness. 30 tablet 1 Past Week at Unknown time  . oxybutynin (DITROPAN-XL) 10 MG 24 hr tablet Take 1 tablet (10 mg total) by mouth every morning. 30 tablet 0 06/02/2017 at Unknown time  . PROAIR HFA 108 (90 Base) MCG/ACT inhaler USE 2 INHALATIONS INTO THE LUNGS EVERY 4 HOURS AS NEEDED FOR SHORTNESS OF BREATH 25.5 g 5 06/03/2017 at Unknown time  . TOPROL XL 25 MG 24 hr tablet TAKE ONE-HALF (1/2) TABLET (12.5 MG TOTAL) DAILY (Patient taking differently: Take 12.5 mg by mouth every evening. TAKE ONE-HALF (1/2) TABLET (12.5 MG TOTAL) DAILY) 90 tablet 1 06/03/2017 at Unknown time  . traMADol-acetaminophen (ULTRACET) 37.5-325 MG per tablet Take 1 tablet by mouth every 6 (six) hours as needed for moderate pain. (Patient taking differently: Take 1 tablet by mouth at bedtime as needed for moderate pain. ) 60 tablet 5 Past Week at Unknown time  . HYDROcodone-acetaminophen (NORCO) 7.5-325 MG tablet Take 1-2 tablets by mouth every 4 (four) hours as needed for moderate pain. Maximum dose per 24 hours - 8 pills (Patient not taking: Reported on 05/24/2017) 30 tablet 0 Not Taking at Unknown time  . phenazopyridine (PYRIDIUM) 200 MG tablet Take 1 tablet (200 mg total) by mouth 3 (three) times daily as needed for pain. (Patient not taking: Reported on 05/24/2017) 30 tablet 0 Not Taking at Unknown time   Allergies:  Allergies  Allergen Reactions  . Percocet [Oxycodone-Acetaminophen] Other (See Comments)    Upsets stomach- does tolerate   . Influenza Virus Vacc Split Pf Other (See Comments)    Unknown  But had to be hospitalized -1966  . Sulfamethoxazole-Trimethoprim Nausea And Vomiting and Other (See Comments)  . Hibiclens  [Chlorhexidine Gluconate] Rash    Family History  Problem Relation Age of Onset  . Heart failure Sister   . Cervical cancer Sister   . Heart failure Brother   . Colon cancer Brother   . Colon cancer Brother   . Heart failure Father   . Cancer Mother        HEAD AND NECK  . Cervical cancer Daughter   . Kidney cancer Daughter    Social History:  reports that she quit smoking about 13 years ago. Her smoking use included Cigarettes. She has a 97.50 pack-year smoking history. She has never used smokeless tobacco. She reports that she does not drink alcohol or use drugs.  Review of Systems  Genitourinary: Positive for hematuria.  All other systems reviewed and are negative.   Physical Exam:  Vital signs in last  24 hours: Temp:  [98.6 F (37 C)] 98.6 F (37 C) (06/18 1002) Pulse Rate:  [68] 68 (06/18 1002) Resp:  [18] 18 (06/18 1002) BP: (172)/(89) 172/89 (06/18 1002) SpO2:  [97 %] 97 % (06/18 1002) Physical Exam  Constitutional: She is oriented to person, place, and time. She appears well-developed and well-nourished.  HENT:  Head: Normocephalic and atraumatic.  Eyes: EOM are normal. Pupils are equal, round, and reactive to light.  Neck: Normal range of motion. No thyromegaly present.  Cardiovascular: Normal rate and regular rhythm.   Respiratory: Effort normal. No respiratory distress.  GI: Soft. She exhibits no distension.  Musculoskeletal: Normal range of motion. She exhibits no edema.  Neurological: She is alert and oriented to person, place, and time.  Skin: Skin is warm and dry.  Psychiatric: She has a normal mood and affect. Her behavior is normal. Judgment and thought content normal.    Laboratory Data:  No results found for this or any previous visit (from the past 24 hour(s)). No results found for this or any previous visit (from the past 240 hour(s)). Creatinine:  Recent Labs  05/29/17 1253  CREATININE 0.91   Baseline Creatinine:  0.9  Impression/Assessment:  83yo with left ureteral stricture  Plan:  The risks/benefits/alternatives to left balloon dilation of her left ureteral stricture was explained to the patient and she understands and wishes to proceed with surgery  [name] 06/03/2017, 11:44 AM

## 2017-06-04 ENCOUNTER — Encounter (HOSPITAL_COMMUNITY): Payer: Self-pay | Admitting: Urology

## 2017-06-04 NOTE — Op Note (Signed)
.  Preoperative diagnosis: Left ureteral sricture  Postoperative diagnosis: Same  Procedure: 1 cystoscopy 2. Left retrograde pyelography 3.  Intraoperative fluoroscopy, under one hour, with interpretation 4. Left 7 x 24 JJ stent placement 5. Balloon dilation of left ureteral stricture  Attending: [name]  Anesthesia: General  Estimated blood loss: None  Drains: Left 7 x 24 JJ ureteral stent without tether  Specimens: none  Antibiotics: ancef  Findings: left ureteral stricture at the level of the pelvic brim 3cm in length. Severe proximal hydroureteronephrosis  Indications: Patient is a 83 year old female with a history of left ureteral stricture and worsening hydronephrosis.  After discussing treatment options, they decided proceed with left stent placement and balloon dilation of her stricture.  Procedure her in detail: The patient was brought to the operating room and a brief timeout was done to ensure correct patient, correct procedure, correct site.  General anesthesia was administered patient was placed in dorsal lithotomy position.  Their genitalia was then prepped and draped in usual sterile fashion.  A rigid 70 French cystoscope was passed in the urethra and the bladder.  Bladder was inspected free masses or lesions.  the ureteral orifices were in the normal orthotopic locations.  a 6 french ureteral catheter was then instilled into the left ureteral orifice.  a gentle retrograde was obtained and findings noted above.  we then placed a zip wire through the ureteral catheter and advanced up to the renal pelvis.   We then advanced a 15 french 6cm balloon catheter along the length of the stricture. We then filled the balloon with contrast to 12cm of water for 1 minute. We the released the balloon. We then placed a 7 x 24 double-j ureteral stent over the original zip wire.  We then removed the wire and good coil was noted in the the renal pelvis under fluoroscopy and the bladder  under direct vision. the bladder was then drained and this concluded the procedure which was well tolerated by patient.  Complications: None  Condition: Stable, extubated, transferred to PACU  Plan: Patient is to be discharged home and followup in 1 month with a renal US.

## 2017-06-20 ENCOUNTER — Other Ambulatory Visit: Payer: Self-pay | Admitting: Urology

## 2017-06-20 DIAGNOSIS — C642 Malignant neoplasm of left kidney, except renal pelvis: Secondary | ICD-10-CM | POA: Diagnosis not present

## 2017-06-20 DIAGNOSIS — N131 Hydronephrosis with ureteral stricture, not elsewhere classified: Secondary | ICD-10-CM | POA: Diagnosis not present

## 2017-06-28 NOTE — Anesthesia Postprocedure Evaluation (Signed)
Anesthesia Post Note  Patient: Sherry [name]  Procedure(s) Performed: Procedure(s) (LRB): CYSTOSCOPY/RETROGRADE/URETEROSCOPY (FLEX 6 fr) THULIUM LASER OF TUMOR (Left) THULIUM LASER OF TUMOR (Left)     Anesthesia Post Evaluation  Last Vitals:  Vitals:   01/30/17 2209 01/31/17 0416  BP: 109/61 (!) 103/55  Pulse: 66 60  Resp: 18 20  Temp: 37.3 C 36.8 C    Last Pain:  Vitals:   01/31/17 0416  TempSrc: Oral  PainSc:                  Riccardo Dubin

## 2017-06-28 NOTE — Addendum Note (Signed)
Addendum  created 06/28/17 1505 by Lyndle Herrlich, MD   Sign clinical note

## 2017-07-22 ENCOUNTER — Encounter: Payer: Self-pay | Admitting: Internal Medicine

## 2017-07-22 ENCOUNTER — Ambulatory Visit (INDEPENDENT_AMBULATORY_CARE_PROVIDER_SITE_OTHER): Payer: Medicare Other | Admitting: Internal Medicine

## 2017-07-22 VITALS — BP 122/64 | HR 63 | Temp 98.8°F | Ht 62.5 in | Wt 116.8 lb

## 2017-07-22 DIAGNOSIS — R0789 Other chest pain: Secondary | ICD-10-CM

## 2017-07-22 DIAGNOSIS — I10 Essential (primary) hypertension: Secondary | ICD-10-CM

## 2017-07-22 MED ORDER — TRAMADOL-ACETAMINOPHEN 37.5-325 MG PO TABS: 1.0000 | ORAL_TABLET | Freq: Four times a day (QID) | ORAL | 5 refills | Status: DC | PRN

## 2017-07-22 NOTE — Progress Notes (Signed)
Subjective:    Patient ID: Sherry [name], female    DOB: 04-19-1923, 83 y.o.   MRN: [id number]  HPI  83 year old patient who has a history of essential hypertension and dyslipidemia. One week ago she fell in her bathroom, sustaining trauma to the right chest wall. She initially did well but over the past few days has had increasing right lateral chest wall pain with movement and inspiration  Past Medical History:  Diagnosis Date  . Anemia   . Arthritis   . Bronchitis   . CAD (coronary artery disease)   . COPD (chronic obstructive pulmonary disease) ([place])   . Emphysema of lung ([place])   . GERD (gastroesophageal reflux disease)   . Headache(784.0)   . History of blood transfusion   . History of kidney stones   . Hyperlipidemia   . Hypertension   . Hypothyroidism   . Osteoporosis   . Rectal adenocarcinoma ([place]) dx'd 08/2009  . Rectal cancer ([place])   . Scoliosis    per Dr Melony Overly ortho  . Shingles   . Shortness of breath   . Tendon adhesions    torn tendon right shoulder     Social History   Social History  . Marital status: Married    Spouse name: N/A  . Number of children: 3  . Years of education: N/A   Occupational History  . Not on file.   Social History Main Topics  . Smoking status: Former Smoker    Packs/day: 1.50    Years: 65.00    Types: Cigarettes    Quit date: 12/18/2003  . Smokeless tobacco: Never Used     Comment: quit 2005  . Alcohol use No  . Drug use: No  . Sexual activity: No   Other Topics Concern  . Not on file   Social History Narrative  . No narrative on file    Past Surgical History:  Procedure Laterality Date  . APPENDECTOMY    . BALLOON DILATION Left 04/10/2017   Procedure: BALLOON DILATION OF LEFT URETERAL STRICTURE;  Surgeon: Cleon Gustin, MD;  Location: [facility];  Service: Urology;  Laterality: Left;  . CARDIAC CATHETERIZATION    . CHOLECYSTECTOMY    . COLON REMOVAL     8 " 12/2009 DR TSUEI  . COLON SURGERY     apr for rectal cancer 2011  . COLONOSCOPY    . COLOSTOMY    . CYSTOSCOPY W/ URETERAL STENT PLACEMENT Left 03/18/2017   Procedure: CYSTOSCOPY WITH RETROGRADE PYELOGRAM/URETERAL STENT PLACEMENT AND URETERAL DIAGNOSTIC LEFT;  Surgeon: Cleon Gustin, MD;  Location: [facility];  Service: Urology;  Laterality: Left;  1 HR [phone] [id number] A [phone] - pt to arrive at 7:00 to receive blood  . CYSTOSCOPY W/ URETERAL STENT PLACEMENT Left 04/10/2017   Procedure: CYSTOSCOPY WITH STENT REPLACEMENT;  Surgeon: Cleon Gustin, MD;  Location: [facility];  Service: Urology;  Laterality: Left;  . CYSTOSCOPY W/ URETERAL STENT PLACEMENT Left 06/03/2017   Procedure: CYSTOSCOPY WITH LEFT RETROGRADE PYELOGRAM/LEFT URETERAL  BALLOON DILATION AND LEFT URETERAL STENT PLACEMENT;  Surgeon: Cleon Gustin, MD;  Location: [facility];  Service: Urology;  Laterality: Left;  . CYSTOSCOPY WITH RETROGRADE PYELOGRAM, URETEROSCOPY AND STENT PLACEMENT Left 03/29/2016   Procedure: CYSTOSCOPY WITH RETROGRADE PYELOGRAM, URETEROSCOPY AND STENT PLACEMENT;  Surgeon: Kathie Rhodes, MD;  Location: [facility];  Service: Urology;  Laterality: Left;  With Laser  . CYSTOSCOPY/RETROGRADE/URETEROSCOPY Left 01/28/2017   Procedure: CYSTOSCOPY/RETROGRADE/URETEROSCOPY (FLEX 6 fr) THULIUM LASER OF  TUMOR;  Surgeon: Franchot Gallo, MD;  Location[facility];  Service: Urology;  Laterality: Left;  . CYSTOSCOPY/RETROGRADE/URETEROSCOPY Left 04/10/2017   Procedure: CYSTOSCOPY, LEFT RETROGRADE PYELOGRAM LEFT URETEROSCOPY;  Surgeon: Cleon Gustin, MD;  Location: [facility];  Service: Urology;  Laterality: Left;  . EYE SURGERY     Cataract with lens- bil  . HOLMIUM LASER APPLICATION Left 3/97/6734   Procedure: HOLMIUM LASER ABLATION LEFT URETERAL TUMOR;  Surgeon: Cleon Gustin, MD;  Location: [facility];  Service: Urology;  Laterality: Left;  . OOPHORECTOMY    . ovary tumor removal     after son was born  . PARASTOMAL HERNIA REPAIR   10/30/2012  . POLYPECTOMY    . RECTUM REMOVAL     1/11 DR TSUEI  . TUBAL LIGATION    . VAGINAL HYSTERECTOMY    . VENTRAL HERNIA REPAIR  10/30/2012   Procedure: LAPAROSCOPIC VENTRAL HERNIA;  Surgeon: Imogene Burn. Georgette Dover, MD;  Location: [facility];  Service: General;  Laterality: N/A;  Laparoscopic repair of parastomal hernia    Family History  Problem Relation Age of Onset  . Heart failure Sister   . Cervical cancer Sister   . Heart failure Brother   . Colon cancer Brother   . Colon cancer Brother   . Heart failure Father   . Cancer Mother        HEAD AND NECK  . Cervical cancer Daughter   . Kidney cancer Daughter     Allergies  Allergen Reactions  . Percocet [Oxycodone-Acetaminophen] Other (See Comments)    Upsets stomach- does tolerate   . Influenza Virus Vacc Split Pf Other (See Comments)    Unknown  But had to be hospitalized -1966  . Sulfamethoxazole-Trimethoprim Nausea And Vomiting and Other (See Comments)  . Hibiclens [Chlorhexidine Gluconate] Rash    Current Outpatient Prescriptions on File Prior to Visit  Medication Sig Dispense Refill  . acetaminophen (TYLENOL) 650 MG CR tablet Take 1,300 mg by mouth every 8 (eight) hours as needed for pain.    Marland Kitchen atorvastatin (LIPITOR) 10 MG tablet TAKE 1 TABLET DAILY (Patient taking differently: TAKE 1 TABLET DAILY IN THE EVENING) 90 tablet 2  . diltiazem (TIAZAC) 180 MG 24 hr capsule TAKE 1 CAPSULE DAILY 90 capsule 3  . ferrous sulfate (SLOW RELEASE IRON) 160 (50 Fe) MG TBCR SR tablet Take 160 mg by mouth daily after lunch.     . levothyroxine (SYNTHROID, LEVOTHROID) 75 MCG tablet TAKE 1 TABLET DAILY (Patient taking differently: TAKE 1 TABLET DAILY IN THE MORNING) 90 tablet 2  . meclizine (ANTIVERT) 25 MG tablet Take 1 tablet (25 mg total) by mouth 3 (three) times daily as needed for dizziness. 30 tablet 1  . oxybutynin (DITROPAN-XL) 10 MG 24 hr tablet Take 1 tablet (10 mg total) by mouth every morning. 30 tablet 0  . phenazopyridine  (PYRIDIUM) 200 MG tablet Take 1 tablet (200 mg total) by mouth 3 (three) times daily as needed for pain. 30 tablet 0  . PROAIR HFA 108 (90 Base) MCG/ACT inhaler USE 2 INHALATIONS INTO THE LUNGS EVERY 4 HOURS AS NEEDED FOR SHORTNESS OF BREATH 25.5 g 5  . TOPROL XL 25 MG 24 hr tablet TAKE ONE-HALF (1/2) TABLET (12.5 MG TOTAL) DAILY (Patient taking differently: Take 12.5 mg by mouth every evening. TAKE ONE-HALF (1/2) TABLET (12.5 MG TOTAL) DAILY) 90 tablet 1   No current facility-administered medications on file prior to visit.     BP 122/64   Pulse  63   Temp 98.8 F (37.1 C) (Oral)   Ht 5' 2.5" (1.588 m)   Wt 116 lb 12.8 oz (53 kg)   BMI 21.02 kg/m     Review of Systems  Constitutional: Negative.   HENT: Negative for congestion, dental problem, hearing loss, rhinorrhea, sinus pressure, sore throat and tinnitus.   Eyes: Negative for pain, discharge and visual disturbance.  Respiratory: Negative for cough and shortness of breath.   Cardiovascular: Positive for chest pain. Negative for palpitations and leg swelling.  Gastrointestinal: Negative for abdominal distention, abdominal pain, blood in stool, constipation, diarrhea, nausea and vomiting.  Genitourinary: Negative for difficulty urinating, dysuria, flank pain, frequency, hematuria, pelvic pain, urgency, vaginal bleeding, vaginal discharge and vaginal pain.  Musculoskeletal: Negative for arthralgias, gait problem and joint swelling.  Skin: Positive for rash.  Neurological: Negative for dizziness, syncope, speech difficulty, weakness, numbness and headaches.  Hematological: Negative for adenopathy. Bruises/bleeds easily.  Psychiatric/Behavioral: Negative for agitation, behavioral problems and dysphoric mood. The patient is not nervous/anxious.        Objective:   Physical Exam  Constitutional: She appears well-developed and well-nourished. No distress.  Vital signs stable Uncomfortable to transfer from a sitting position to the  examining table  Pulmonary/Chest: Effort normal and breath sounds normal. She exhibits tenderness.  Skin:  Bruising over the lateral aspect of the right upper arm          Assessment & Plan:   Right-sided chest wall pain following trauma.  Probable fractured rib.  Options discussed.  Will treat supportively. Will report any new or worsening symptoms, such as shortness of breath or fever Essential hypertension, stable  Nyoka Cowden

## 2017-07-22 NOTE — Patient Instructions (Signed)
You  may move around, but avoid painful motions and activities.  Apply heat to the sore area for 15 to 20 minutes 3 or 4 times daily for the next two to 3 days. 

## 2017-08-25 ENCOUNTER — Emergency Department (HOSPITAL_COMMUNITY)
Admission: EM | Admit: 2017-08-25 | Discharge: 2017-08-25 | Disposition: A | Discharge status: Home / Self Care | Payer: Medicare Other | Attending: Emergency Medicine | Admitting: Emergency Medicine

## 2017-08-25 ENCOUNTER — Encounter (HOSPITAL_COMMUNITY): Payer: Self-pay

## 2017-08-25 ENCOUNTER — Emergency Department (HOSPITAL_COMMUNITY): Payer: Medicare Other

## 2017-08-25 DIAGNOSIS — M79672 Pain in left foot: Secondary | ICD-10-CM | POA: Diagnosis not present

## 2017-08-25 DIAGNOSIS — I10 Essential (primary) hypertension: Secondary | ICD-10-CM | POA: Insufficient documentation

## 2017-08-25 DIAGNOSIS — Z79899 Other long term (current) drug therapy: Secondary | ICD-10-CM | POA: Insufficient documentation

## 2017-08-25 DIAGNOSIS — Y93K9 Activity, other involving animal care: Secondary | ICD-10-CM | POA: Insufficient documentation

## 2017-08-25 DIAGNOSIS — Z87891 Personal history of nicotine dependence: Secondary | ICD-10-CM | POA: Insufficient documentation

## 2017-08-25 DIAGNOSIS — S9032XA Contusion of left foot, initial encounter: Secondary | ICD-10-CM | POA: Diagnosis not present

## 2017-08-25 DIAGNOSIS — Y998 Other external cause status: Secondary | ICD-10-CM | POA: Insufficient documentation

## 2017-08-25 DIAGNOSIS — Z85048 Personal history of other malignant neoplasm of rectum, rectosigmoid junction, and anus: Secondary | ICD-10-CM | POA: Insufficient documentation

## 2017-08-25 DIAGNOSIS — W010XXA Fall on same level from slipping, tripping and stumbling without subsequent striking against object, initial encounter: Secondary | ICD-10-CM | POA: Diagnosis not present

## 2017-08-25 DIAGNOSIS — Y929 Unspecified place or not applicable: Secondary | ICD-10-CM | POA: Insufficient documentation

## 2017-08-25 DIAGNOSIS — I2510 Atherosclerotic heart disease of native coronary artery without angina pectoris: Secondary | ICD-10-CM | POA: Insufficient documentation

## 2017-08-25 DIAGNOSIS — J449 Chronic obstructive pulmonary disease, unspecified: Secondary | ICD-10-CM | POA: Diagnosis not present

## 2017-08-25 DIAGNOSIS — E039 Hypothyroidism, unspecified: Secondary | ICD-10-CM | POA: Insufficient documentation

## 2017-08-25 DIAGNOSIS — S99922A Unspecified injury of left foot, initial encounter: Secondary | ICD-10-CM | POA: Diagnosis not present

## 2017-08-25 DIAGNOSIS — Z960 Presence of urogenital implants: Secondary | ICD-10-CM | POA: Diagnosis not present

## 2017-08-25 MED ORDER — IBUPROFEN 400 MG PO TABS
400.0000 mg | ORAL_TABLET | Freq: Once | ORAL | Status: AC | PRN
  Administered 2017-08-25: 400 mg via ORAL

## 2017-08-25 MED ORDER — IBUPROFEN 400 MG PO TABS
ORAL_TABLET | ORAL | Status: AC
  Filled 2017-08-25: qty 1

## 2017-08-25 NOTE — ED Notes (Signed)
Declined W/C at D/C and was escorted to lobby by RN. 

## 2017-08-25 NOTE — ED Provider Notes (Signed)
[facility] DEPT Provider Note   CSN: [id number] Arrival date & time: 08/25/17  1327     History   Chief Complaint Chief Complaint  Patient presents with  . Fall    HPI Sherry [name] is a 83 y.o. female.  HPI   83 year old female complaining of left foot pain. She had mechanical fall sutured to her dog and fell forward. She did her hit her head denies any loss of consciousness. Denies any significant headache, neck or acute pelvic pain. She can ambulate although with increased pain. Bruising noted. No numbness or tingling.  Past Medical History:  Diagnosis Date  . Anemia   . Arthritis   . Bronchitis   . CAD (coronary artery disease)   . COPD (chronic obstructive pulmonary disease) ([place])   . Emphysema of lung ([place])   . GERD (gastroesophageal reflux disease)   . Headache(784.0)   . History of blood transfusion   . History of kidney stones   . Hyperlipidemia   . Hypertension   . Hypothyroidism   . Osteoporosis   . Rectal adenocarcinoma ([place]) dx'd 08/2009  . Rectal cancer ([place])   . Scoliosis    per Dr Melony Overly ortho  . Shingles   . Shortness of breath   . Tendon adhesions    torn tendon right shoulder    Patient Active Problem List   Diagnosis Date Noted  . Hematuria, gross 01/28/2017  . Acquired pelvic enterocele 12/28/2013  . Unsteady gait 11/26/2013  . Colostomy care ([place]) 08/21/2013  . Renal lesion 07/26/2013  . Skin lesion - left lower quadrant abdominal wall 01/21/2013  . Parastomal hernia without obstruction or gangrene 09/26/2012  . PARESTHESIA, HANDS 08/15/2010  . ADENOCARCINOMA, RECTUM 09/01/2009  . Hypothyroidism 09/01/2009  . Dyslipidemia 09/01/2009  . ANEMIA-IRON DEFICIENCY 09/01/2009  . Essential hypertension 09/01/2009  . COPD 09/01/2009  . OSTEOPOROSIS 09/01/2009    Past Surgical History:  Procedure Laterality Date  . APPENDECTOMY    . BALLOON DILATION Left 04/10/2017   Procedure: BALLOON DILATION OF LEFT URETERAL STRICTURE;   Surgeon: Cleon Gustin, MD;  Location: [facility];  Service: Urology;  Laterality: Left;  . CARDIAC CATHETERIZATION    . CHOLECYSTECTOMY    . COLON REMOVAL     8 " 12/2009 DR TSUEI  . COLON SURGERY     apr for rectal cancer 2011  . COLONOSCOPY    . COLOSTOMY    . CYSTOSCOPY W/ URETERAL STENT PLACEMENT Left 03/18/2017   Procedure: CYSTOSCOPY WITH RETROGRADE PYELOGRAM/URETERAL STENT PLACEMENT AND URETERAL DIAGNOSTIC LEFT;  Surgeon: Cleon Gustin, MD;  Location: [facility];  Service: Urology;  Laterality: Left;  1 HR [phone] [id number] A [phone] - pt to arrive at 7:00 to receive blood  . CYSTOSCOPY W/ URETERAL STENT PLACEMENT Left 04/10/2017   Procedure: CYSTOSCOPY WITH STENT REPLACEMENT;  Surgeon: Cleon Gustin, MD;  Location: [facility];  Service: Urology;  Laterality: Left;  . CYSTOSCOPY W/ URETERAL STENT PLACEMENT Left 06/03/2017   Procedure: CYSTOSCOPY WITH LEFT RETROGRADE PYELOGRAM/LEFT URETERAL  BALLOON DILATION AND LEFT URETERAL STENT PLACEMENT;  Surgeon: Cleon Gustin, MD;  Location[facility];  Service: Urology;  Laterality: Left;  . CYSTOSCOPY WITH RETROGRADE PYELOGRAM, URETEROSCOPY AND STENT PLACEMENT Left 03/29/2016   Procedure: CYSTOSCOPY WITH RETROGRADE PYELOGRAM, URETEROSCOPY AND STENT PLACEMENT;  Surgeon: Kathie Rhodes, MD;  [facility];  Service: Urology;  Laterality: Left;  With Laser  . CYSTOSCOPY/RETROGRADE/URETEROSCOPY Left 01/28/2017   Procedure: CYSTOSCOPY/RETROGRADE/URETEROSCOPY (FLEX 6 fr) THULIUM LASER OF TUMOR;  Surgeon: Franchot Gallo, MD;  [facility];  Service: Urology;  Laterality: Left;  . CYSTOSCOPY/RETROGRADE/URETEROSCOPY Left 04/10/2017   Procedure: CYSTOSCOPY, LEFT RETROGRADE PYELOGRAM LEFT URETEROSCOPY;  Surgeon: Cleon Gustin, MD;  [facility];  Service: Urology;  Laterality: Left;  . EYE SURGERY     Cataract with lens- bil  . HOLMIUM LASER APPLICATION Left 03/08/5572   Procedure: HOLMIUM LASER ABLATION LEFT  URETERAL TUMOR;  Surgeon: Cleon Gustin, MD;  Location[facility];  Service: Urology;  Laterality: Left;  . OOPHORECTOMY    . ovary tumor removal     after son was born  . PARASTOMAL HERNIA REPAIR  10/30/2012  . POLYPECTOMY    . RECTUM REMOVAL     1/11 DR TSUEI  . TUBAL LIGATION    . VAGINAL HYSTERECTOMY    . VENTRAL HERNIA REPAIR  10/30/2012   Procedure: LAPAROSCOPIC VENTRAL HERNIA;  Surgeon: Imogene Burn. Georgette Dover, MD;  Location: [facility];  Service: General;  Laterality: N/A;  Laparoscopic repair of parastomal hernia    OB History    No data available       Home Medications    Prior to Admission medications   Medication Sig Start Date End Date Taking? Authorizing Provider  acetaminophen (TYLENOL) 650 MG CR tablet Take 1,300 mg by mouth every 8 (eight) hours as needed for pain.    [provider]  atorvastatin (LIPITOR) 10 MG tablet TAKE 1 TABLET DAILY Patient taking differently: TAKE 1 TABLET DAILY IN THE EVENING 02/18/17   Marletta Lor, MD  diltiazem [facility]) 180 MG 24 hr capsule TAKE 1 CAPSULE DAILY 05/14/17   Marletta Lor, MD  ferrous sulfate (SLOW RELEASE IRON) 160 (50 Fe) MG TBCR SR tablet Take 160 mg by mouth daily after lunch.     [provider]  levothyroxine (SYNTHROID, LEVOTHROID) 75 MCG tablet TAKE 1 TABLET DAILY Patient taking differently: TAKE 1 TABLET DAILY IN THE MORNING 02/18/17   Marletta Lor, MD  meclizine (ANTIVERT) 25 MG tablet Take 1 tablet (25 mg total) by mouth 3 (three) times daily as needed for dizziness. 10/26/14   Marletta Lor, MD  oxybutynin (DITROPAN-XL) 10 MG 24 hr tablet Take 1 tablet (10 mg total) by mouth every morning. 04/10/17   McKenzie, Candee Furbish, MD  phenazopyridine (PYRIDIUM) 200 MG tablet Take 1 tablet (200 mg total) by mouth 3 (three) times daily as needed for pain. 06/03/17   McKenzie, Candee Furbish, MD  PROAIR HFA 108 [phone] Base) MCG/ACT inhaler USE 2 INHALATIONS INTO THE LUNGS EVERY 4 HOURS AS NEEDED FOR  SHORTNESS OF BREATH 02/14/16   Marletta Lor, MD  TOPROL XL 25 MG 24 hr tablet TAKE ONE-HALF (1/2) TABLET (12.5 MG TOTAL) DAILY Patient taking differently: Take 12.5 mg by mouth every evening. TAKE ONE-HALF (1/2) TABLET (12.5 MG TOTAL) DAILY 11/05/16   Marletta Lor, MD  traMADol-acetaminophen (ULTRACET) 37.5-325 MG tablet Take 1 tablet by mouth every 6 (six) hours as needed for moderate pain. 07/22/17   Marletta Lor, MD    Family History Family History  Problem Relation Age of Onset  . Heart failure Sister   . Cervical cancer Sister   . Heart failure Brother   . Colon cancer Brother   . Colon cancer Brother   . Heart failure Father   . Cancer Mother        HEAD AND NECK  . Cervical cancer Daughter   . Kidney cancer Daughter  Social History Social History  Substance Use Topics  . Smoking status: Former Smoker    Packs/day: 1.50    Years: 65.00    Types: Cigarettes    Quit date: 12/18/2003  . Smokeless tobacco: Never Used     Comment: quit 2005  . Alcohol use No     Allergies   Percocet [oxycodone-acetaminophen]; Influenza virus vacc split pf; Sulfamethoxazole-trimethoprim; and Hibiclens [chlorhexidine gluconate]   Review of Systems Review of Systems  All systems reviewed and negative, other than as noted in HPI.  Physical Exam Updated Vital Signs BP (!) 157/94 (BP Location: Right Arm)   Pulse 92   Temp 98.3 F (36.8 C) (Oral)   Resp 16   Ht 5' 3.5" (1.613 m)   Wt 54.4 kg (120 lb)   SpO2 98%   BMI 20.92 kg/m   Physical Exam  Constitutional: She appears well-developed and well-nourished. No distress.  HENT:  Head: Normocephalic and atraumatic.  Eyes: Conjunctivae are normal. Right eye exhibits no discharge. Left eye exhibits no discharge.  Neck: Neck supple.  Cardiovascular: Normal rate, regular rhythm and normal heart sounds.  Exam reveals no gallop and no friction rub.   No murmur heard. Pulmonary/Chest: Effort normal and breath  sounds normal. No respiratory distress.  Abdominal: Soft. She exhibits no distension. There is no tenderness.  Musculoskeletal: She exhibits edema and tenderness.  Mild diffuse swelling across the dorsum left foot. Ecchymosis noted just proximal to the MT joints. Closed injury. Palpable DP pulse. Can move all toes. Foot is warm and appears well perfused. Tenderness across the mid to lateral foot.  Neurological: She is alert.  Skin: Skin is warm and dry.  Psychiatric: She has a normal mood and affect. Her behavior is normal. Thought content normal.  Nursing note and vitals reviewed.    ED Treatments / Results  Labs (all labs ordered are listed, but only abnormal results are displayed) Labs Reviewed - No data to display  EKG  EKG Interpretation None       Radiology Dg Foot Complete Left  Result Date: 08/25/2017 CLINICAL DATA:  Fall, foot injury, pain. EXAM: LEFT FOOT - COMPLETE 3+ VIEW COMPARISON:  None. FINDINGS: Diffuse osteopenia limits characterization of osseous detail, however, there is no fracture line or displaced fracture fragment identified. Focal lucency at the cortex of the proximal first metatarsal bone is felt to be a nutrient vessel foramen rather than nondisplaced fracture. Adjacent soft tissues are unremarkable. IMPRESSION: Osteopenia.  No convincing fracture or osseous displacement. Electronically Signed   By: Franki Cabot M.D.   On: 08/25/2017 14:46    Procedures Procedures (including critical care time)  Medications Ordered in ED Medications  ibuprofen (ADVIL,MOTRIN) 400 MG tablet (not administered)  ibuprofen (ADVIL,MOTRIN) tablet 400 mg (400 mg Oral Given 08/25/17 1340)     Initial Impression / Assessment and Plan / ED Course  I have reviewed the triage vital signs and the nursing notes.  Pertinent labs & imaging results that were available during my care of the patient were reviewed by me and considered in my medical decision making (see chart for  details).     Left foot pain after mechanical fall. Closed injury. Neurovascular intact. Negative imaging. Likely contusion. Syptomatic treatment. Return precautions discussed.  Final Clinical Impressions(s) / ED Diagnoses   Final diagnoses:  Contusion of left foot, initial encounter    New Prescriptions New Prescriptions   No medications on file     Virgel Manifold, MD 09/10/17 1525

## 2017-08-25 NOTE — ED Triage Notes (Signed)
Per Pt, Pt was trying to keep from hurting her dog when she got dripped up and she fell forward. Pt reports hitting her head with no LOC along with left foot pain. Pt is unable to walk on the left foot without increased pain, Discoloration and swelling noted to the left foot.

## 2017-08-25 NOTE — Progress Notes (Signed)
Orthopedic Tech Progress Note Patient Details:  Sherry [name] November 26, 1923 [id number]  Ortho Devices Type of Ortho Device: Postop shoe/boot Ortho Device/Splint Interventions: Application   [name] 08/25/2017, 4:24 PM

## 2017-08-27 ENCOUNTER — Other Ambulatory Visit (HOSPITAL_COMMUNITY): Payer: Medicare Other

## 2017-09-05 ENCOUNTER — Encounter: Payer: Self-pay | Admitting: Internal Medicine

## 2017-09-06 NOTE — Patient Instructions (Signed)
Sherry [name]  09/06/2017     @PREFPERIOPPHARMACY @   Your procedure is scheduled on  09/16/2017   Report to [name] at  9  A.M.  Call this number if you have problems the morning of surgery:  [phone]   Remember:  Do not eat food or drink liquids after midnight.  Take these medicines the morning of surgery with A SIP OF WATER  Diltiazem, levothyroxine, antivert, ditropan, pyridium, toprol XL,tramdol. Use your inhaler before you come.   Do not wear jewelry, make-up or nail polish.  Do not wear lotions, powders, or perfumes, or deoderant.  Do not shave 48 hours prior to surgery.  Men may shave face and neck.  Do not bring valuables to the hospital.  [facility] is not responsible for any belongings or valuables.  Contacts, dentures or bridgework may not be worn into surgery.  Leave your suitcase in the car.  After surgery it may be brought to your room.  For patients admitted to the hospital, discharge time will be determined by your treatment team.  Patients discharged the day of surgery will not be allowed to drive home.   Name and phone number of your driver:    Family  Special instructions:  None  Please read over the following fact sheets that you were given. Anesthesia Post-op Instructions and Care and Recovery After Surgery       Ureteral Stent Implantation Ureteral stent implantation is a procedure to insert (implant) a flexible, soft, plastic tube (stent) into a tube (ureter) that drains urine from the kidneys. The stent supports the ureter while it heals and helps to drain urine from the kidneys. You may have a ureteral stent implanted after having a procedure to remove a blockage from the ureter (ureterolysis or pyeloplasty).You may also have a stent implanted to open the flow of urine when you have a blockage caused by a kidney stone, tumor, blood clot, or infection. You have two ureters, one on each side of the body. The ureters  connect the kidneys to the organ that holds urine until it passes out of the body (bladder). The stent is placed so that one end is in the kidney, and one end is in the bladder. The stent is usually taken out after your ureter has healed. Depending on your condition, you may have a stent for just a few weeks, or you may have a long-term stent that will need to be replaced every few months. Tell a health care provider about:  Any allergies you have.  All medicines you are taking, including vitamins, herbs, eye drops, creams, and over-the-counter medicines.  Any problems you or family members have had with anesthetic medicines.  Any blood disorders you have.  Any surgeries you have had.  Any medical conditions you have.  Whether you are pregnant or may be pregnant. What are the risks? Generally, this is a safe procedure. However, problems may occur, including:  Infection.  Bleeding.  Allergic reactions to medicines.  Damage to other structures or organs. Tearing (perforation) of the ureter is possible.  Movement of the stent away from where it is placed during surgery (migration).  What happens before the procedure?  Ask your health care provider about: ? Changing or stopping your regular medicines. This is especially important if you are taking diabetes medicines or blood thinners. ? Taking medicines such as aspirin and ibuprofen. These medicines can  thin your blood. Do not take these medicines before your procedure if your health care provider instructs you not to.  Follow instructions from your health care provider about eating or drinking restrictions.  Do not drink alcohol and do not use any tobacco products before your procedure, as told by your health care provider.  You may be given antibiotic medicine to help prevent infection.  Plan to have someone take you home after the procedure.  If you go home right after the procedure, plan to have someone with you for 24  hours. What happens during the procedure?  An IV tube will be inserted into one of your veins.  You will be given a medicine to make you fall asleep (general anesthetic). You may also be given a medicine to help you relax (sedative).  A thin, tube-shaped instrument with a light and tiny camera at the end (cystoscope) will be inserted into your urethra. The urethra is the tube that drains urine from the bladder out of the body. In men, the urethra opens at the end of the penis. In women, the urethra opens in front of the vaginal opening.  The cystoscope will be passed into your bladder.  A thin wire (guide wire) will be passed through your bladder and into your ureter. This is used to guide the stent into your ureter.  The stent will be inserted into your ureter.  The guide wire and the cystoscope will be removed.  A flexible tube (catheter) will be inserted through your urethra so that one end is in your bladder. This helps to drain urine from your bladder. The procedure may vary among hospitals and health care providers. What happens after the procedure?  Your blood pressure, heart rate, breathing rate, and blood oxygen level will be monitored often until the medicines you were given have worn off.  You may continue to receive medicine and fluids through an IV tube.  You may have some soreness or pain in your abdomen and urethra. Medicines will be available to help you.  You will be encouraged to get up and walk around as soon as you can.  You will have a catheter draining your urine.  You will have some blood in your urine.  Do not drive for 24 hours if you received a sedative. This information is not intended to replace advice given to you by your health care provider. Make sure you discuss any questions you have with your health care provider. Document Released: 11/30/2000 Document Revised: 05/10/2016 Document Reviewed: 06/17/2015 Elsevier Interactive Patient Education  2018  [place].  Ureteral Stent Implantation, Care After Refer to this sheet in the next few weeks. These instructions provide you with information about caring for yourself after your procedure. Your health care provider may also give you more specific instructions. Your treatment has been planned according to current medical practices, but problems sometimes occur. Call your health care provider if you have any problems or questions after your procedure. What can I expect after the procedure? After the procedure, it is common to have:  Nausea.  Mild pain when you urinate. You may feel this pain in your lower back or lower abdomen. Pain should stop within a few minutes after you urinate. This may last for up to 1 week.  A small amount of blood in your urine for several days.  Follow these instructions at home:  Medicines  Take over-the-counter and prescription medicines only as told by your health care provider.  If you were prescribed an antibiotic medicine, take it as told by your health care provider. Do not stop taking the antibiotic even if you start to feel better.  Do not drive for 24 hours if you received a sedative.  Do not drive or operate heavy machinery while taking prescription pain medicines. Activity  Return to your normal activities as told by your health care provider. Ask your health care provider what activities are safe for you.  Do not lift anything that is heavier than 10 lb (4.5 kg). Follow this limit for 1 week after your procedure, or for as long as told by your health care provider. General instructions  Watch for any blood in your urine. Call your health care provider if the amount of blood in your urine increases.  If you have a catheter: ? Follow instructions from your health care provider about taking care of your catheter and collection bag. ? Do not take baths, swim, or use a hot tub until your health care provider approves.  Drink enough fluid to  keep your urine clear or pale yellow.  Keep all follow-up visits as told by your health care provider. This is important. Contact a health care provider if:  You have pain that gets worse or does not get better with medicine, especially pain when you urinate.  You have difficulty urinating.  You feel nauseous or you vomit repeatedly during a period of more than 2 days after the procedure. Get help right away if:  Your urine is dark red or has blood clots in it.  You are leaking urine (have incontinence).  The end of the stent comes out of your urethra.  You cannot urinate.  You have sudden, sharp, or severe pain in your abdomen or lower back.  You have a fever. This information is not intended to replace advice given to you by your health care provider. Make sure you discuss any questions you have with your health care provider. Document Released: 08/05/2013 Document Revised: 05/10/2016 Document Reviewed: 06/17/2015 Elsevier Interactive Patient Education  [employer].  Cystoscopy Cystoscopy is a procedure that is used to help diagnose and sometimes treat conditions that affect that lower urinary tract. The lower urinary tract includes the bladder and the tube that drains urine from the bladder out of the body (urethra). Cystoscopy is performed with a thin, tube-shaped instrument with a light and camera at the end (cystoscope). The cystoscope may be hard (rigid) or flexible, depending on the goal of the procedure.The cystoscope is inserted through the urethra, into the bladder. Cystoscopy may be recommended if you have:  Urinary tractinfections that keep coming back (recurring).  Blood in the urine (hematuria).  Loss of bladder control (urinary incontinence) or an overactive bladder.  Unusual cells found in a urine sample.  A blockage in the urethra.  Painful urination.  An abnormality in the bladder found during an intravenous pyelogram (IVP) or CT  scan.  Cystoscopy may also be done to remove a sample of tissue to be examined under a microscope (biopsy). Tell a health care provider about:  Any allergies you have.  All medicines you are taking, including vitamins, herbs, eye drops, creams, and over-the-counter medicines.  Any problems you or family members have had with anesthetic medicines.  Any blood disorders you have.  Any surgeries you have had.  Any medical conditions you have.  Whether you are pregnant or may be pregnant. What are the risks? Generally, this is a safe procedure.  However, problems may occur, including:  Infection.  Bleeding.  Allergic reactions to medicines.  Damage to other structures or organs.  What happens before the procedure?  Ask your health care provider about: ? Changing or stopping your regular medicines. This is especially important if you are taking diabetes medicines or blood thinners. ? Taking medicines such as aspirin and ibuprofen. These medicines can thin your blood. Do not take these medicines before your procedure if your health care provider instructs you not to.  Follow instructions from your health care provider about eating or drinking restrictions.  You may be given antibiotic medicine to help prevent infection.  You may have an exam or testing, such as X-rays of the bladder, urethra, or kidneys.  You may have urine tests to check for signs of infection.  Plan to have someone take you home after the procedure. What happens during the procedure?  To reduce your risk of infection,your health care team will wash or sanitize their hands.  You will be given one or more of the following: ? A medicine to help you relax (sedative). ? A medicine to numb the area (local anesthetic).  The area around the opening of your urethra will be cleaned.  The cystoscope will be passed through your urethra into your bladder.  Germ-free (sterile)fluid will flow through the  cystoscope to fill your bladder. The fluid will stretch your bladder so that your surgeon can clearly examine your bladder walls.  The cystoscope will be removed and your bladder will be emptied. The procedure may vary among health care providers and hospitals. What happens after the procedure?  You may have some soreness or pain in your abdomen and urethra. Medicines will be available to help you.  You may have some blood in your urine.  Do not drive for 24 hours if you received a sedative. This information is not intended to replace advice given to you by your health care provider. Make sure you discuss any questions you have with your health care provider. Document Released: 11/30/2000 Document Revised: 04/12/2016 Document Reviewed: 10/20/2015 Elsevier Interactive Patient Education  2017 [place].  Cystoscopy, Care After Refer to this sheet in the next few weeks. These instructions provide you with information about caring for yourself after your procedure. Your health care provider may also give you more specific instructions. Your treatment has been planned according to current medical practices, but problems sometimes occur. Call your health care provider if you have any problems or questions after your procedure. What can I expect after the procedure? After the procedure, it is common to have:  Mild pain when you urinate. Pain should stop within a few minutes after you urinate. This may last for up to 1 week.  A small amount of blood in your urine for several days.  Feeling like you need to urinate but producing only a small amount of urine.  Follow these instructions at home:  Medicines  Take over-the-counter and prescription medicines only as told by your health care provider.  If you were prescribed an antibiotic medicine, take it as told by your health care provider. Do not stop taking the antibiotic even if you start to feel better. General instructions   Return  to your normal activities as told by your health care provider. Ask your health care provider what activities are safe for you.  Do not drive for 24 hours if you received a sedative.  Watch for any blood in your urine. If the amount  of blood in your urine increases, call your health care provider.  Follow instructions from your health care provider about eating or drinking restrictions.  If a tissue sample was removed for testing (biopsy) during your procedure, it is your responsibility to get your test results. Ask your health care provider or the department performing the test when your results will be ready.  Drink enough fluid to keep your urine clear or pale yellow.  Keep all follow-up visits as told by your health care provider. This is important. Contact a health care provider if:  You have pain that gets worse or does not get better with medicine, especially pain when you urinate.  You have difficulty urinating. Get help right away if:  You have more blood in your urine.  You have blood clots in your urine.  You have abdominal pain.  You have a fever or chills.  You are unable to urinate. This information is not intended to replace advice given to you by your health care provider. Make sure you discuss any questions you have with your health care provider. Document Released: 06/22/2005 Document Revised: 05/10/2016 Document Reviewed: 10/20/2015 Elsevier Interactive Patient Education  2017 [place] Anesthesia, Adult General anesthesia is the use of medicines to make a person "go to sleep" (be unconscious) for a medical procedure. General anesthesia is often recommended when a procedure:  Is long.  Requires you to be still or in an unusual position.  Is major and can cause you to lose blood.  Is impossible to do without general anesthesia.  The medicines used for general anesthesia are called general anesthetics. In addition to making you sleep, the  medicines:  Prevent pain.  Control your blood pressure.  Relax your muscles.  Tell a health care provider about:  Any allergies you have.  All medicines you are taking, including vitamins, herbs, eye drops, creams, and over-the-counter medicines.  Any problems you or family members have had with anesthetic medicines.  Types of anesthetics you have had in the past.  Any bleeding disorders you have.  Any surgeries you have had.  Any medical conditions you have.  Any history of heart or lung conditions, such as heart failure, sleep apnea, or chronic obstructive pulmonary disease (COPD).  Whether you are pregnant or may be pregnant.  Whether you use tobacco, alcohol, marijuana, or street drugs.  Any history of Armed forces logistics/support/administrative officer.  Any history of depression or anxiety. What are the risks? Generally, this is a safe procedure. However, problems may occur, including:  Allergic reaction to anesthetics.  Lung and heart problems.  Inhaling food or liquids from your stomach into your lungs (aspiration).  Injury to nerves.  Waking up during your procedure and being unable to move (rare).  Extreme agitation or a state of mental confusion (delirium) when you wake up from the anesthetic.  Air in the bloodstream, which can lead to stroke.  These problems are more likely to develop if you are having a major surgery or if you have an advanced medical condition. You can prevent some of these complications by answering all of your health care provider's questions thoroughly and by following all pre-procedure instructions. General anesthesia can cause side effects, including:  Nausea or vomiting  A sore throat from the breathing tube.  Feeling cold or shivery.  Feeling tired, washed out, or achy.  Sleepiness or drowsiness.  Confusion or agitation.  What happens before the procedure? Staying hydrated Follow instructions from your health care provider  about hydration, which  may include:  Up to 2 hours before the procedure - you may continue to drink clear liquids, such as water, clear fruit juice, black coffee, and plain tea.  Eating and drinking restrictions Follow instructions from your health care provider about eating and drinking, which may include:  8 hours before the procedure - stop eating heavy meals or foods such as meat, fried foods, or fatty foods.  6 hours before the procedure - stop eating light meals or foods, such as toast or cereal.  6 hours before the procedure - stop drinking milk or drinks that contain milk.  2 hours before the procedure - stop drinking clear liquids.  Medicines  Ask your health care provider about: ? Changing or stopping your regular medicines. This is especially important if you are taking diabetes medicines or blood thinners. ? Taking medicines such as aspirin and ibuprofen. These medicines can thin your blood. Do not take these medicines before your procedure if your health care provider instructs you not to. ? Taking new dietary supplements or medicines. Do not take these during the week before your procedure unless your health care provider approves them.  If you are told to take a medicine or to continue taking a medicine on the day of the procedure, take the medicine with sips of water. General instructions   Ask if you will be going home the same day, the following day, or after a longer hospital stay. ? Plan to have someone take you home. ? Plan to have someone stay with you for the first 24 hours after you leave the hospital or clinic.  For 3-6 weeks before the procedure, try not to use any tobacco products, such as cigarettes, chewing tobacco, and e-cigarettes.  You may brush your teeth on the morning of the procedure, but make sure to spit out the toothpaste. What happens during the procedure?  You will be given anesthetics through a mask and through an IV tube in one of your veins.  You may receive  medicine to help you relax (sedative).  As soon as you are asleep, a breathing tube may be used to help you breathe.  An anesthesia specialist will stay with you throughout the procedure. He or she will help keep you comfortable and safe by continuing to give you medicines and adjusting the amount of medicine that you get. He or she will also watch your blood pressure, pulse, and oxygen levels to make sure that the anesthetics do not cause any problems.  If a breathing tube was used to help you breathe, it will be removed before you wake up. The procedure may vary among health care providers and hospitals. What happens after the procedure?  You will wake up, often slowly, after the procedure is complete, usually in a recovery area.  Your blood pressure, heart rate, breathing rate, and blood oxygen level will be monitored until the medicines you were given have worn off.  You may be given medicine to help you calm down if you feel anxious or agitated.  If you will be going home the same day, your health care provider may check to make sure you can stand, drink, and urinate.  Your health care providers will treat your pain and side effects before you go home.  Do not drive for 24 hours if you received a sedative.  You may: ? Feel nauseous and vomit. ? Have a sore throat. ? Have mental slowness. ? Feel cold or shivery. ?  Feel sleepy. ? Feel tired. ? Feel sore or achy, even in parts of your body where you did not have surgery. This information is not intended to replace advice given to you by your health care provider. Make sure you discuss any questions you have with your health care provider. Document Released: 03/11/2008 Document Revised: 05/15/2016 Document Reviewed: 11/17/2015 Elsevier Interactive Patient Education  2018 [place] Anesthesia, Adult, Care After These instructions provide you with information about caring for yourself after your procedure. Your health  care provider may also give you more specific instructions. Your treatment has been planned according to current medical practices, but problems sometimes occur. Call your health care provider if you have any problems or questions after your procedure. What can I expect after the procedure? After the procedure, it is common to have:  Vomiting.  A sore throat.  Mental slowness.  It is common to feel:  Nauseous.  Cold or shivery.  Sleepy.  Tired.  Sore or achy, even in parts of your body where you did not have surgery.  Follow these instructions at home: For at least 24 hours after the procedure:  Do not: ? Participate in activities where you could fall or become injured. ? Drive. ? Use heavy machinery. ? Drink alcohol. ? Take sleeping pills or medicines that cause drowsiness. ? Make important decisions or sign legal documents. ? Take care of children on your own.  Rest. Eating and drinking  If you vomit, drink water, juice, or soup when you can drink without vomiting.  Drink enough fluid to keep your urine clear or pale yellow.  Make sure you have little or no nausea before eating solid foods.  Follow the diet recommended by your health care provider. General instructions  Have a responsible adult stay with you until you are awake and alert.  Return to your normal activities as told by your health care provider. Ask your health care provider what activities are safe for you.  Take over-the-counter and prescription medicines only as told by your health care provider.  If you smoke, do not smoke without supervision.  Keep all follow-up visits as told by your health care provider. This is important. Contact a health care provider if:  You continue to have nausea or vomiting at home, and medicines are not helpful.  You cannot drink fluids or start eating again.  You cannot urinate after 8-12 hours.  You develop a skin rash.  You have fever.  You have  increasing redness at the site of your procedure. Get help right away if:  You have difficulty breathing.  You have chest pain.  You have unexpected bleeding.  You feel that you are having a life-threatening or urgent problem. This information is not intended to replace advice given to you by your health care provider. Make sure you discuss any questions you have with your health care provider. Document Released: 03/11/2001 Document Revised: 05/07/2016 Document Reviewed: 11/17/2015 Elsevier Interactive Patient Education  [employer].

## 2017-09-10 ENCOUNTER — Encounter (HOSPITAL_COMMUNITY): Payer: Self-pay

## 2017-09-10 ENCOUNTER — Encounter (HOSPITAL_COMMUNITY)
Admission: RE | Admit: 2017-09-10 | Discharge: 2017-09-10 | Disposition: A | Discharge status: Home / Self Care | Payer: Medicare Other | Source: Ambulatory Visit | Attending: Urology | Admitting: Urology

## 2017-09-10 DIAGNOSIS — Z01812 Encounter for preprocedural laboratory examination: Secondary | ICD-10-CM | POA: Diagnosis not present

## 2017-09-10 LAB — CBC WITH DIFFERENTIAL/PLATELET
BASOS ABS: 0 10*3/uL (ref 0.0–0.1)
BASOS PCT: 0 %
EOS ABS: 0.1 10*3/uL (ref 0.0–0.7)
EOS PCT: 2 %
HCT: 34.3 % — ABNORMAL LOW (ref 36.0–46.0)
Hemoglobin: 11.3 g/dL — ABNORMAL LOW (ref 12.0–15.0)
Lymphocytes Relative: 8 %
Lymphs Abs: 0.5 10*3/uL — ABNORMAL LOW (ref 0.7–4.0)
MCH: 35.3 pg — ABNORMAL HIGH (ref 26.0–34.0)
MCHC: 32.9 g/dL (ref 30.0–36.0)
MCV: 107.2 fL — AB (ref 78.0–100.0)
MONO ABS: 0.4 10*3/uL (ref 0.1–1.0)
Monocytes Relative: 8 %
Neutro Abs: 4.4 10*3/uL (ref 1.7–7.7)
Neutrophils Relative %: 82 %
PLATELETS: 279 10*3/uL (ref 150–400)
RBC: 3.2 MIL/uL — AB (ref 3.87–5.11)
RDW: 12.8 % (ref 11.5–15.5)
WBC: 5.4 10*3/uL (ref 4.0–10.5)

## 2017-09-10 LAB — BASIC METABOLIC PANEL
ANION GAP: 8 (ref 5–15)
BUN: 16 mg/dL (ref 6–20)
CALCIUM: 10.1 mg/dL (ref 8.9–10.3)
CO2: 27 mmol/L (ref 22–32)
Chloride: 102 mmol/L (ref 101–111)
Creatinine, Ser: 0.91 mg/dL (ref 0.44–1.00)
GFR calc Af Amer: >60 mL/min (ref >60)
GFR, EST NON AFRICAN AMERICAN: 54 mL/min — AB (ref >60)
GLUCOSE: 105 mg/dL — AB (ref 65–99)
POTASSIUM: 4.4 mmol/L (ref 3.5–5.1)
SODIUM: 137 mmol/L (ref 135–145)

## 2017-09-12 DIAGNOSIS — M79672 Pain in left foot: Secondary | ICD-10-CM | POA: Diagnosis not present

## 2017-09-13 ENCOUNTER — Ambulatory Visit: Payer: Medicare Other | Admitting: Internal Medicine

## 2017-09-16 ENCOUNTER — Encounter (HOSPITAL_COMMUNITY)
Admission: RE | Disposition: A | Discharge status: Home / Self Care | Payer: Self-pay | Source: Ambulatory Visit | Attending: Urology

## 2017-09-16 ENCOUNTER — Ambulatory Visit (HOSPITAL_COMMUNITY): Payer: Medicare Other | Admitting: Anesthesiology

## 2017-09-16 ENCOUNTER — Encounter (HOSPITAL_COMMUNITY): Payer: Self-pay | Admitting: Anesthesiology

## 2017-09-16 ENCOUNTER — Ambulatory Visit (HOSPITAL_COMMUNITY): Payer: Medicare Other

## 2017-09-16 ENCOUNTER — Ambulatory Visit (HOSPITAL_COMMUNITY)
Admission: RE | Admit: 2017-09-16 | Discharge: 2017-09-16 | Disposition: A | Discharge status: Home / Self Care | Payer: Medicare Other | Source: Ambulatory Visit | Attending: Urology | Admitting: Urology

## 2017-09-16 DIAGNOSIS — Z87891 Personal history of nicotine dependence: Secondary | ICD-10-CM | POA: Insufficient documentation

## 2017-09-16 DIAGNOSIS — Z79899 Other long term (current) drug therapy: Secondary | ICD-10-CM | POA: Insufficient documentation

## 2017-09-16 DIAGNOSIS — J449 Chronic obstructive pulmonary disease, unspecified: Secondary | ICD-10-CM | POA: Insufficient documentation

## 2017-09-16 DIAGNOSIS — N131 Hydronephrosis with ureteral stricture, not elsewhere classified: Secondary | ICD-10-CM | POA: Insufficient documentation

## 2017-09-16 DIAGNOSIS — Z85048 Personal history of other malignant neoplasm of rectum, rectosigmoid junction, and anus: Secondary | ICD-10-CM | POA: Diagnosis not present

## 2017-09-16 DIAGNOSIS — Z882 Allergy status to sulfonamides status: Secondary | ICD-10-CM | POA: Diagnosis not present

## 2017-09-16 DIAGNOSIS — K219 Gastro-esophageal reflux disease without esophagitis: Secondary | ICD-10-CM | POA: Insufficient documentation

## 2017-09-16 DIAGNOSIS — D494 Neoplasm of unspecified behavior of bladder: Secondary | ICD-10-CM | POA: Insufficient documentation

## 2017-09-16 DIAGNOSIS — Z87442 Personal history of urinary calculi: Secondary | ICD-10-CM | POA: Diagnosis not present

## 2017-09-16 DIAGNOSIS — I2510 Atherosclerotic heart disease of native coronary artery without angina pectoris: Secondary | ICD-10-CM | POA: Insufficient documentation

## 2017-09-16 DIAGNOSIS — E785 Hyperlipidemia, unspecified: Secondary | ICD-10-CM | POA: Diagnosis not present

## 2017-09-16 DIAGNOSIS — I10 Essential (primary) hypertension: Secondary | ICD-10-CM | POA: Diagnosis not present

## 2017-09-16 DIAGNOSIS — N132 Hydronephrosis with renal and ureteral calculous obstruction: Secondary | ICD-10-CM | POA: Diagnosis not present

## 2017-09-16 DIAGNOSIS — Z885 Allergy status to narcotic agent status: Secondary | ICD-10-CM | POA: Insufficient documentation

## 2017-09-16 DIAGNOSIS — Z9689 Presence of other specified functional implants: Secondary | ICD-10-CM | POA: Diagnosis not present

## 2017-09-16 DIAGNOSIS — Z466 Encounter for fitting and adjustment of urinary device: Secondary | ICD-10-CM | POA: Diagnosis not present

## 2017-09-16 DIAGNOSIS — N1330 Unspecified hydronephrosis: Secondary | ICD-10-CM | POA: Diagnosis not present

## 2017-09-16 DIAGNOSIS — N135 Crossing vessel and stricture of ureter without hydronephrosis: Secondary | ICD-10-CM

## 2017-09-16 DIAGNOSIS — E039 Hypothyroidism, unspecified: Secondary | ICD-10-CM | POA: Diagnosis not present

## 2017-09-16 DIAGNOSIS — M419 Scoliosis, unspecified: Secondary | ICD-10-CM | POA: Diagnosis not present

## 2017-09-16 DIAGNOSIS — N210 Calculus in bladder: Secondary | ICD-10-CM | POA: Diagnosis not present

## 2017-09-16 HISTORY — PX: CYSTOSCOPY W/ URETERAL STENT PLACEMENT: SHX1429

## 2017-09-16 SURGERY — CYSTOSCOPY, WITH RETROGRADE PYELOGRAM AND URETERAL STENT INSERTION
Anesthesia: General | Laterality: Left

## 2017-09-16 MED ORDER — MIDAZOLAM HCL 2 MG/2ML IJ SOLN
1.0000 mg | INTRAMUSCULAR | Status: AC
Start: 2017-09-16 — End: 2017-09-16
  Administered 2017-09-16 (×2): 1 mg via INTRAVENOUS

## 2017-09-16 MED ORDER — DIATRIZOATE MEGLUMINE 30 % UR SOLN
URETHRAL | Status: AC
  Filled 2017-09-16: qty 300

## 2017-09-16 MED ORDER — FENTANYL CITRATE (PF) 100 MCG/2ML IJ SOLN: 25.0000 ug | INTRAMUSCULAR | Status: DC | PRN

## 2017-09-16 MED ORDER — CEFAZOLIN SODIUM-DEXTROSE 2-4 GM/100ML-% IV SOLN
2.0000 g | INTRAVENOUS | Status: AC
  Administered 2017-09-16: 2 g via INTRAVENOUS
  Filled 2017-09-16: qty 100

## 2017-09-16 MED ORDER — TRAMADOL-ACETAMINOPHEN 37.5-325 MG PO TABS: 1.0000 | ORAL_TABLET | Freq: Four times a day (QID) | ORAL | 5 refills | Status: DC | PRN

## 2017-09-16 MED ORDER — PROPOFOL 500 MG/50ML IV EMUL
INTRAVENOUS | Status: DC | PRN
  Administered 2017-09-16: 50 ug/kg/min via INTRAVENOUS

## 2017-09-16 MED ORDER — PROPOFOL 10 MG/ML IV BOLUS
INTRAVENOUS | Status: AC
  Filled 2017-09-16: qty 20

## 2017-09-16 MED ORDER — EPHEDRINE SULFATE 50 MG/ML IJ SOLN
INTRAMUSCULAR | Status: AC
  Filled 2017-09-16: qty 1

## 2017-09-16 MED ORDER — FENTANYL CITRATE (PF) 100 MCG/2ML IJ SOLN
INTRAMUSCULAR | Status: AC
Start: 2017-09-16 — End: ?
  Filled 2017-09-16: qty 2

## 2017-09-16 MED ORDER — DIATRIZOATE MEGLUMINE 30 % UR SOLN
URETHRAL | Status: DC | PRN
  Administered 2017-09-16: 30 mL via URETHRAL

## 2017-09-16 MED ORDER — LIDOCAINE HCL (PF) 1 % IJ SOLN
INTRAMUSCULAR | Status: AC
  Filled 2017-09-16: qty 5

## 2017-09-16 MED ORDER — MIDAZOLAM HCL 2 MG/2ML IJ SOLN
INTRAMUSCULAR | Status: AC
  Filled 2017-09-16: qty 2

## 2017-09-16 MED ORDER — PROPOFOL 10 MG/ML IV BOLUS
INTRAVENOUS | Status: AC
Start: 2017-09-16 — End: ?
  Filled 2017-09-16: qty 20

## 2017-09-16 MED ORDER — FENTANYL CITRATE (PF) 100 MCG/2ML IJ SOLN
INTRAMUSCULAR | Status: DC | PRN
  Administered 2017-09-16: 25 ug via INTRAVENOUS

## 2017-09-16 MED ORDER — LIDOCAINE HCL 1 % IJ SOLN
INTRAMUSCULAR | Status: DC | PRN
  Administered 2017-09-16: 25 mg via INTRADERMAL

## 2017-09-16 MED ORDER — PHENYLEPHRINE 40 MCG/ML (10ML) SYRINGE FOR IV PUSH (FOR BLOOD PRESSURE SUPPORT)
PREFILLED_SYRINGE | INTRAVENOUS | Status: AC
  Filled 2017-09-16: qty 10

## 2017-09-16 MED ORDER — STERILE WATER FOR IRRIGATION IR SOLN
Status: DC | PRN
  Administered 2017-09-16: 1000 mL

## 2017-09-16 MED ORDER — SODIUM CHLORIDE 0.9 % IR SOLN
Status: DC | PRN
  Administered 2017-09-16: 3000 mL via INTRAVESICAL

## 2017-09-16 MED ORDER — LIDOCAINE VISCOUS 2 % MT SOLN
OROMUCOSAL | Status: AC
  Filled 2017-09-16: qty 15

## 2017-09-16 MED ORDER — SODIUM CHLORIDE 0.9 % IJ SOLN
INTRAMUSCULAR | Status: AC
  Filled 2017-09-16: qty 10

## 2017-09-16 MED ORDER — LACTATED RINGERS IV SOLN
INTRAVENOUS | Status: DC
  Administered 2017-09-16: 07:00:00 via INTRAVENOUS

## 2017-09-16 SURGICAL SUPPLY — 20 items
BAG DRAIN URO TABLE W/ADPT NS (DRAPE) ×3 IMPLANT
BAG HAMPER (MISCELLANEOUS) ×3 IMPLANT
CATH INTERMIT  6FR 70CM (CATHETERS) ×3 IMPLANT
CLOTH BEACON ORANGE TIMEOUT ST (SAFETY) ×3 IMPLANT
DECANTER SPIKE VIAL GLASS SM (MISCELLANEOUS) ×3 IMPLANT
GLOVE BIO SURGEON STRL SZ8 (GLOVE) ×3 IMPLANT
GLOVE BIOGEL PI IND STRL 6.5 (GLOVE) ×1 IMPLANT
GLOVE BIOGEL PI INDICATOR 6.5 (GLOVE) ×2
GOWN STRL REUS W/ TWL XL LVL3 (GOWN DISPOSABLE) ×1 IMPLANT
GOWN STRL REUS W/TWL LRG LVL3 (GOWN DISPOSABLE) ×3 IMPLANT
GOWN STRL REUS W/TWL XL LVL3 (GOWN DISPOSABLE) ×2
GUIDEWIRE STR ZIPWIRE 035X150 (MISCELLANEOUS) ×3 IMPLANT
IV NS IRRIG 3000ML ARTHROMATIC (IV SOLUTION) ×3 IMPLANT
KIT ROOM TURNOVER AP CYSTO (KITS) ×3 IMPLANT
MANIFOLD NEPTUNE II (INSTRUMENTS) ×3 IMPLANT
PACK CYSTO (CUSTOM PROCEDURE TRAY) ×3 IMPLANT
PAD ARMBOARD 7.5X6 YLW CONV (MISCELLANEOUS) ×3 IMPLANT
STENT CONTOUR 7FRX24 (STENTS) ×3 IMPLANT
SYRINGE 10CC LL (SYRINGE) ×3 IMPLANT
TOWEL OR 17X26 4PK STRL BLUE (TOWEL DISPOSABLE) ×3 IMPLANT

## 2017-09-16 NOTE — H&P (Signed)
Urology Admission H&P  Chief Complaint: left ureteral stricture  History of Present Illness: Ms Minetti is a 83yo with left ureteral stricture here for stent exchange  Past Medical History:  Diagnosis Date  . Anemia   . Arthritis   . Bronchitis   . CAD (coronary artery disease)   . COPD (chronic obstructive pulmonary disease) ([place])   . Emphysema of lung ([place])   . GERD (gastroesophageal reflux disease)   . Headache(784.0)   . History of blood transfusion   . History of kidney stones   . Hyperlipidemia   . Hypertension   . Hypothyroidism   . Osteoporosis   . Rectal adenocarcinoma ([place]) dx'd 08/2009  . Rectal cancer ([place])   . Scoliosis    per Dr Melony Overly ortho  . Shingles   . Shortness of breath   . Tendon adhesions    torn tendon right shoulder   Past Surgical History:  Procedure Laterality Date  . APPENDECTOMY    . BALLOON DILATION Left 04/10/2017   Procedure: BALLOON DILATION OF LEFT URETERAL STRICTURE;  Surgeon: Cleon Gustin, MD;  Location: [facility];  Service: Urology;  Laterality: Left;  . CARDIAC CATHETERIZATION    . CHOLECYSTECTOMY    . COLON REMOVAL     8 " 12/2009 DR TSUEI  . COLON SURGERY     apr for rectal cancer 2011  . COLONOSCOPY    . COLOSTOMY    . CYSTOSCOPY W/ URETERAL STENT PLACEMENT Left 03/18/2017   Procedure: CYSTOSCOPY WITH RETROGRADE PYELOGRAM/URETERAL STENT PLACEMENT AND URETERAL DIAGNOSTIC LEFT;  Surgeon: Cleon Gustin, MD;  Location: [facility];  Service: Urology;  Laterality: Left;  1 HR [phone] [id number] [phone] - pt to arrive at 7:00 to receive blood  . CYSTOSCOPY W/ URETERAL STENT PLACEMENT Left 04/10/2017   Procedure: CYSTOSCOPY WITH STENT REPLACEMENT;  Surgeon: Cleon Gustin, MD;  Location: [facility];  Service: Urology;  Laterality: Left;  . CYSTOSCOPY W/ URETERAL STENT PLACEMENT Left 06/03/2017   Procedure: CYSTOSCOPY WITH LEFT RETROGRADE PYELOGRAM/LEFT URETERAL  BALLOON DILATION AND LEFT URETERAL STENT  PLACEMENT;  Surgeon: Cleon Gustin, MD;  Location: [facility];  Service: Urology;  Laterality: Left;  . CYSTOSCOPY WITH RETROGRADE PYELOGRAM, URETEROSCOPY AND STENT PLACEMENT Left 03/29/2016   Procedure: CYSTOSCOPY WITH RETROGRADE PYELOGRAM, URETEROSCOPY AND STENT PLACEMENT;  Surgeon: Kathie Rhodes, MD;  Location: [facility];  Service: Urology;  Laterality: Left;  With Laser  . CYSTOSCOPY/RETROGRADE/URETEROSCOPY Left 01/28/2017   Procedure: CYSTOSCOPY/RETROGRADE/URETEROSCOPY (FLEX 6 fr) THULIUM LASER OF TUMOR;  Surgeon: Franchot Gallo, MD;  Location: [facility];  Service: Urology;  Laterality: Left;  . CYSTOSCOPY/RETROGRADE/URETEROSCOPY Left 04/10/2017   Procedure: CYSTOSCOPY, LEFT RETROGRADE PYELOGRAM LEFT URETEROSCOPY;  Surgeon: Cleon Gustin, MD;  Location: [facility];  Service: Urology;  Laterality: Left;  . EYE SURGERY     Cataract with lens- bil  . HOLMIUM LASER APPLICATION Left 7/78/2423   Procedure: HOLMIUM LASER ABLATION LEFT URETERAL TUMOR;  Surgeon: Cleon Gustin, MD;  Location: [facility];  Service: Urology;  Laterality: Left;  . OOPHORECTOMY    . ovary tumor removal     after son was born  . PARASTOMAL HERNIA REPAIR  10/30/2012  . POLYPECTOMY    . RECTUM REMOVAL     1/11 DR TSUEI  . TUBAL LIGATION    . VAGINAL HYSTERECTOMY    . VENTRAL HERNIA REPAIR  10/30/2012   Procedure: LAPAROSCOPIC VENTRAL HERNIA;  Surgeon: Imogene Burn. Georgette Dover, MD;  Location: [place];  Service: General;  Laterality: N/A;  Laparoscopic repair of parastomal hernia    Home Medications:  Prescriptions Prior to Admission  Medication Sig Dispense Refill Last Dose  . acetaminophen (TYLENOL) 325 MG tablet Take 325 mg by mouth every 6 (six) hours as needed for mild pain.   09/15/2017 at Unknown time  . atorvastatin (LIPITOR) 10 MG tablet TAKE 1 TABLET DAILY (Patient taking differently: TAKE 1 TABLET DAILY IN THE EVENING) 90 tablet 2 09/15/2017 at Unknown time  . diltiazem (TIAZAC) 180 MG 24 hr capsule TAKE 1 CAPSULE DAILY  90 capsule 3 09/16/2017 at Unknown time  . ferrous sulfate (SLOW RELEASE IRON) 160 (50 Fe) MG TBCR SR tablet Take 160 mg by mouth daily after lunch.    09/15/2017 at Unknown time  . levothyroxine (SYNTHROID, LEVOTHROID) 75 MCG tablet TAKE 1 TABLET DAILY (Patient taking differently: TAKE 1 TABLET DAILY IN THE MORNING) 90 tablet 2 09/16/2017 at Unknown time  . meclizine (ANTIVERT) 25 MG tablet Take 1 tablet (25 mg total) by mouth 3 (three) times daily as needed for dizziness. 30 tablet 1 09/15/2017 at Unknown time  . oxybutynin (DITROPAN-XL) 10 MG 24 hr tablet Take 1 tablet (10 mg total) by mouth every morning. 30 tablet 0 09/15/2017 at Unknown time  . PROAIR HFA 108 (90 Base) MCG/ACT inhaler USE 2 INHALATIONS INTO THE LUNGS EVERY 4 HOURS AS NEEDED FOR SHORTNESS OF BREATH 25.5 g 5 09/15/2017 at Unknown time  . TOPROL XL 25 MG 24 hr tablet TAKE ONE-HALF (1/2) TABLET (12.5 MG TOTAL) DAILY (Patient taking differently: Take 12.5 mg by mouth every evening. TAKE ONE-HALF (1/2) TABLET (12.5 MG TOTAL) DAILY) 90 tablet 1 09/15/2017 at 1900  . traMADol-acetaminophen (ULTRACET) 37.5-325 MG tablet Take 1 tablet by mouth every 6 (six) hours as needed for moderate pain. 60 tablet 5 09/15/2017 at Unknown time  . phenazopyridine (PYRIDIUM) 200 MG tablet Take 1 tablet (200 mg total) by mouth 3 (three) times daily as needed for pain. (Patient not taking: Reported on 09/06/2017) 30 tablet 0 Not Taking at Unknown time   Allergies:  Allergies  Allergen Reactions  . Percocet [Oxycodone-Acetaminophen] Other (See Comments)    Upsets stomach- does tolerate   . Influenza Virus Vacc Split Pf Other (See Comments)    Unknown  But had to be hospitalized -1966  . Sulfamethoxazole-Trimethoprim Nausea And Vomiting and Other (See Comments)  . Hibiclens [Chlorhexidine Gluconate] Rash    Family History  Problem Relation Age of Onset  . Heart failure Sister   . Cervical cancer Sister   . Heart failure Brother   . Colon cancer Brother    . Colon cancer Brother   . Heart failure Father   . Cancer Mother        HEAD AND NECK  . Cervical cancer Daughter   . Kidney cancer Daughter    Social History:  reports that she quit smoking about 13 years ago. Her smoking use included Cigarettes. She has a 97.50 pack-year smoking history. She has never used smokeless tobacco. She reports that she does not drink alcohol or use drugs.  Review of Systems  All other systems reviewed and are negative.   Physical Exam:  Vital signs in last 24 hours: Temp:  [97.8 F (36.6 C)] 97.8 F (36.6 C) (10/01 0641) Pulse Rate:  [74] 74 (10/01 0641) Resp:  [18-31] 29 (10/01 0701) BP: (155)/(95) 155/95 (10/01 0641) SpO2:  [98 %-100 %] 99 % (10/01 0701) Physical Exam  Constitutional: She is oriented to person,  place, and time. She appears well-developed and well-nourished.  HENT:  Head: Normocephalic and atraumatic.  Eyes: Pupils are equal, round, and reactive to light. EOM are normal.  Neck: Normal range of motion. No thyromegaly present.  Cardiovascular: Normal rate and regular rhythm.   Respiratory: Effort normal. No respiratory distress.  GI: Soft. She exhibits no distension.  Musculoskeletal: Normal range of motion. She exhibits no edema.  Neurological: She is alert and oriented to person, place, and time.  Skin: Skin is warm and dry.  Psychiatric: She has a normal mood and affect. Her behavior is normal. Judgment and thought content normal.    Laboratory Data:  No results found for this or any previous visit (from the past 24 hour(s)). No results found for this or any previous visit (from the past 240 hour(s)). Creatinine:  Recent Labs  09/10/17 1016  CREATININE 0.91   Baseline Creatinine: 0.9  Impression/Assessment:  83yo with left ureteral stricture  Plan:  The risks/benefits/alternatives to left stent exchange was explained to the patient and she understands and with to proceed with surgery   [name] 09/16/2017, 7:46 AM

## 2017-09-16 NOTE — Transfer of Care (Signed)
Immediate Anesthesia Transfer of Care Note  Patient: Sherry [name]  Procedure(s) Performed: CYSTOSCOPY WITH LEFT STENT EXCHANGE/ RETROGRADE (Left )  Patient Location: PACU  Anesthesia Type:MAC  Level of Consciousness: awake and patient cooperative  Airway & Oxygen Therapy: Patient Spontanous Breathing and Patient connected to face mask oxygen  Post-op Assessment: Report given to RN, Post -op Vital signs reviewed and stable and Patient moving all extremities  Post vital signs: Reviewed and stable  Last Vitals:  Vitals:   09/16/17 0745 09/16/17 0750  BP: (!) 157/81 (!) 162/91  Pulse:    Resp: (!) 23 16  Temp:    SpO2: 98% 100%    Last Pain:  Vitals:   09/16/17 0641  TempSrc: Oral      Patients Stated Pain Goal: 6 (56/43/32 9518)  Complications: No apparent anesthesia complications

## 2017-09-16 NOTE — Op Note (Signed)
.  Preoperative diagnosis: Left ureteral stricture  Postoperative diagnosis: encrusted left ureteral stent, bladder tumors   Procedure: 1 cystoscopy 2. Left retrograde pyelography 3. Intraoperative fluoroscopy, under one hour, with interpretation 4. Left 7 x 24 JJ stent placement 5. cystolithalopaxy for a stone less than 2.5cm  Attending: [name]  Anesthesia: MAC  Estimated blood loss: None  Drains: Left 7 x 24 JJ ureteral stent without tether  Specimens: none  Antibiotics: ancef  Findings: left encrusted ureteral stent. Moderate hydronephrosis. Numerous papillary bladder tumors involving 40-50% of the bladder. No masses/lesions in the bladder. Ureteral orifices in normal anatomic location.  Indications: Patient is a 83 year old female with a history of left ureteral stricture managed with a ureteral stent.  After discussing treatment options, they decided proceed with left stent placement.  Procedure her in detail: The patient was brought to the operating room and a brief timeout was done to ensure correct patient, correct procedure, correct site.  General anesthesia was administered patient was placed in dorsal lithotomy position.  Their genitalia was then prepped and draped in usual sterile fashion.  A rigid 28 French cystoscope was passed in the urethra and the bladder.  Bladder was inspected and she was noted to have numerous papillary tumors involving 40-50% of her bladder.  the ureteral orifices were in the normal orthotopic locations. The left ureteral stent had a 1.5cm stone attached to it. Using the stone crusher the stone was fragmented and the fragments were removed.  a 6 french ureteral catheter was then instilled into the left ureteral orifice.  a gentle retrograde was obtained and findings noted above.  we then placed a zip wire through the ureteral catheter and advanced up to the renal pelvis.    We then placed a 7 x 24 double-j ureteral stent over the original zip  wire.  We then removed the wire and good coil was noted in the the renal pelvis under fluoroscopy and the bladder under direct vision.  the bladder was then drained and this concluded the procedure which was well tolerated by patient.  Complications: None  Condition: Stable, extubated, transferred to PACU  Plan: Patient is to be discharged home and followup in 1 month for bladder tumor removal

## 2017-09-16 NOTE — Anesthesia Postprocedure Evaluation (Signed)
Anesthesia Post Note  Patient: Sherry [name]  Procedure(s) Performed: CYSTOSCOPY WITH LEFT STENT EXCHANGE/ RETROGRADE (Left )  Patient location during evaluation: PACU Anesthesia Type: General Level of consciousness: awake and patient cooperative Pain management: pain level controlled Vital Signs Assessment: post-procedure vital signs reviewed and stable Respiratory status: spontaneous breathing, nonlabored ventilation and respiratory function stable Cardiovascular status: blood pressure returned to baseline Postop Assessment: no apparent nausea or vomiting Anesthetic complications: no     Last Vitals:  Vitals:   09/16/17 0745 09/16/17 0750  BP: (!) 157/81 (!) 162/91  Pulse:    Resp: (!) 23 16  Temp:    SpO2: 98% 100%    Last Pain:  Vitals:   09/16/17 0641  TempSrc: Oral                 [name] J

## 2017-09-16 NOTE — Anesthesia Preprocedure Evaluation (Signed)
Anesthesia Evaluation  Patient identified by MRN, date of birth, ID band Patient awake    Reviewed: Allergy & Precautions, NPO status , Patient's Chart, lab work & pertinent test results  Airway Mallampati: II  TM Distance: >3 FB Neck ROM: full    Dental  (+) Edentulous Upper, Edentulous Lower   Pulmonary shortness of breath, COPD, former smoker,    breath sounds clear to auscultation       Cardiovascular hypertension, + CAD and + DOE   Rhythm:regular Rate:Normal     Neuro/Psych  Headaches,    GI/Hepatic GERD  Controlled,  Endo/Other  Hypothyroidism   Renal/GU Renal diseaseL renal tumor     Musculoskeletal  (+) Arthritis ,   Abdominal   Peds  Hematology  (+) anemia ,   Anesthesia Other Findings   Reproductive/Obstetrics                             Anesthesia Physical Anesthesia Plan  ASA: III  Anesthesia Plan: General   Post-op Pain Management:    Induction: Intravenous  PONV Risk Score and Plan:   Airway Management Planned: LMA  Additional Equipment:   Intra-op Plan:   Post-operative Plan: Extubation in OR  Informed Consent: I have reviewed the patients History and Physical, chart, labs and discussed the procedure including the risks, benefits and alternatives for the proposed anesthesia with the patient or authorized representative who has indicated his/her understanding and acceptance.     Plan Discussed with:   Anesthesia Plan Comments: (Plan to transfuse 1 PRBC prior to surgery.)        Anesthesia Quick Evaluation

## 2017-09-16 NOTE — Discharge Instructions (Signed)
Ureteral Stent Implantation, Care After °Refer to this sheet in the next few weeks. These instructions provide you with information about caring for yourself after your procedure. Your health care provider may also give you more specific instructions. Your treatment has been planned according to current medical practices, but problems sometimes occur. Call your health care provider if you have any problems or questions after your procedure. °What can I expect after the procedure? °After the procedure, it is common to have: °· Nausea. °· Mild pain when you urinate. You may feel this pain in your lower back or lower abdomen. Pain should stop within a few minutes after you urinate. This may last for up to 1 week. °· A small amount of blood in your urine for several days. ° °Follow these instructions at home: ° °Medicines °· Take over-the-counter and prescription medicines only as told by your health care provider. °· If you were prescribed an antibiotic medicine, take it as told by your health care provider. Do not stop taking the antibiotic even if you start to feel better. °· Do not drive for 24 hours if you received a sedative. °· Do not drive or operate heavy machinery while taking prescription pain medicines. °Activity °· Return to your normal activities as told by your health care provider. Ask your health care provider what activities are safe for you. °· Do not lift anything that is heavier than 10 lb (4.5 kg). Follow this limit for 1 week after your procedure, or for as long as told by your health care provider. °General instructions °· Watch for any blood in your urine. Call your health care provider if the amount of blood in your urine increases. °· If you have a catheter: °? Follow instructions from your health care provider about taking care of your catheter and collection bag. °? Do not take baths, swim, or use a hot tub until your health care provider approves. °· Drink enough fluid to keep your urine  clear or pale yellow. °· Keep all follow-up visits as told by your health care provider. This is important. °Contact a health care provider if: °· You have pain that gets worse or does not get better with medicine, especially pain when you urinate. °· You have difficulty urinating. °· You feel nauseous or you vomit repeatedly during a period of more than 2 days after the procedure. °Get help right away if: °· Your urine is dark red or has blood clots in it. °· You are leaking urine (have incontinence). °· The end of the stent comes out of your urethra. °· You cannot urinate. °· You have sudden, sharp, or severe pain in your abdomen or lower back. °· You have a fever. °This information is not intended to replace advice given to you by your health care provider. Make sure you discuss any questions you have with your health care provider. °Document Released: 08/05/2013 Document Revised: 05/10/2016 Document Reviewed: 06/17/2015 °Elsevier Interactive Patient Education © 2018 [employer] °PATIENT INSTRUCTIONS °POST-ANESTHESIA ° °IMMEDIATELY FOLLOWING SURGERY:  Do not drive or operate machinery for the first twenty four hours after surgery.  Do not make any important decisions for twenty four hours after surgery or while taking narcotic pain medications or sedatives.  If you develop intractable nausea and vomiting or a severe headache please notify your doctor immediately. ° °FOLLOW-UP:  Please make an appointment with your surgeon as instructed. You do not need to follow up with anesthesia unless specifically instructed to do so. ° °  WOUND CARE INSTRUCTIONS (if applicable):  Keep a dry clean dressing on the anesthesia/puncture wound site if there is drainage.  Once the wound has quit draining you may leave it open to air.  Generally you should leave the bandage intact for twenty four hours unless there is drainage.  If the epidural site drains for more than 36-48 hours please call the anesthesia  department. ° °QUESTIONS?:  Please feel free to call your physician or the hospital operator if you have any questions, and they will be happy to assist you.   ° ° °  ° ° ° °

## 2017-09-17 ENCOUNTER — Encounter (HOSPITAL_COMMUNITY): Payer: Self-pay | Admitting: Urology

## 2017-09-23 ENCOUNTER — Other Ambulatory Visit: Payer: Self-pay | Admitting: Urology

## 2017-10-13 ENCOUNTER — Other Ambulatory Visit: Payer: Self-pay | Admitting: Internal Medicine

## 2017-10-16 ENCOUNTER — Inpatient Hospital Stay (HOSPITAL_COMMUNITY): Admission: RE | Admit: 2017-10-16 | Payer: Medicare Other | Source: Ambulatory Visit

## 2017-10-16 ENCOUNTER — Ambulatory Visit (INDEPENDENT_AMBULATORY_CARE_PROVIDER_SITE_OTHER): Payer: Medicare Other | Admitting: Urology

## 2017-10-16 ENCOUNTER — Encounter (HOSPITAL_COMMUNITY)
Admission: RE | Admit: 2017-10-16 | Discharge: 2017-10-16 | Disposition: A | Discharge status: Home / Self Care | Payer: Medicare Other | Source: Ambulatory Visit | Attending: Urology | Admitting: Urology

## 2017-10-16 DIAGNOSIS — C678 Malignant neoplasm of overlapping sites of bladder: Secondary | ICD-10-CM | POA: Diagnosis not present

## 2017-10-16 DIAGNOSIS — C642 Malignant neoplasm of left kidney, except renal pelvis: Secondary | ICD-10-CM | POA: Diagnosis not present

## 2017-10-17 ENCOUNTER — Encounter (HOSPITAL_COMMUNITY): Payer: Self-pay

## 2017-10-21 ENCOUNTER — Ambulatory Visit (HOSPITAL_COMMUNITY)
Admission: RE | Admit: 2017-10-21 | Discharge: 2017-10-21 | Disposition: A | Discharge status: Home / Self Care | Payer: Medicare Other | Source: Ambulatory Visit | Attending: Urology | Admitting: Urology

## 2017-10-21 ENCOUNTER — Ambulatory Visit (HOSPITAL_COMMUNITY): Payer: Medicare Other | Admitting: Anesthesiology

## 2017-10-21 ENCOUNTER — Other Ambulatory Visit: Payer: Self-pay

## 2017-10-21 ENCOUNTER — Encounter (HOSPITAL_COMMUNITY): Payer: Self-pay | Admitting: *Deleted

## 2017-10-21 ENCOUNTER — Encounter (HOSPITAL_COMMUNITY)
Admission: RE | Disposition: A | Discharge status: Home / Self Care | Payer: Self-pay | Source: Ambulatory Visit | Attending: Urology

## 2017-10-21 DIAGNOSIS — K219 Gastro-esophageal reflux disease without esophagitis: Secondary | ICD-10-CM | POA: Diagnosis not present

## 2017-10-21 DIAGNOSIS — Z885 Allergy status to narcotic agent status: Secondary | ICD-10-CM | POA: Diagnosis not present

## 2017-10-21 DIAGNOSIS — Z8619 Personal history of other infectious and parasitic diseases: Secondary | ICD-10-CM | POA: Diagnosis not present

## 2017-10-21 DIAGNOSIS — C679 Malignant neoplasm of bladder, unspecified: Secondary | ICD-10-CM | POA: Diagnosis not present

## 2017-10-21 DIAGNOSIS — D494 Neoplasm of unspecified behavior of bladder: Secondary | ICD-10-CM | POA: Diagnosis not present

## 2017-10-21 DIAGNOSIS — Z882 Allergy status to sulfonamides status: Secondary | ICD-10-CM | POA: Diagnosis not present

## 2017-10-21 DIAGNOSIS — Z887 Allergy status to serum and vaccine status: Secondary | ICD-10-CM | POA: Insufficient documentation

## 2017-10-21 DIAGNOSIS — I10 Essential (primary) hypertension: Secondary | ICD-10-CM | POA: Insufficient documentation

## 2017-10-21 DIAGNOSIS — M810 Age-related osteoporosis without current pathological fracture: Secondary | ICD-10-CM | POA: Diagnosis not present

## 2017-10-21 DIAGNOSIS — E039 Hypothyroidism, unspecified: Secondary | ICD-10-CM | POA: Diagnosis not present

## 2017-10-21 DIAGNOSIS — J439 Emphysema, unspecified: Secondary | ICD-10-CM | POA: Diagnosis not present

## 2017-10-21 DIAGNOSIS — I2510 Atherosclerotic heart disease of native coronary artery without angina pectoris: Secondary | ICD-10-CM | POA: Insufficient documentation

## 2017-10-21 DIAGNOSIS — E785 Hyperlipidemia, unspecified: Secondary | ICD-10-CM | POA: Insufficient documentation

## 2017-10-21 DIAGNOSIS — Z85048 Personal history of other malignant neoplasm of rectum, rectosigmoid junction, and anus: Secondary | ICD-10-CM | POA: Insufficient documentation

## 2017-10-21 DIAGNOSIS — Z87442 Personal history of urinary calculi: Secondary | ICD-10-CM | POA: Insufficient documentation

## 2017-10-21 DIAGNOSIS — M419 Scoliosis, unspecified: Secondary | ICD-10-CM | POA: Diagnosis not present

## 2017-10-21 DIAGNOSIS — Z87891 Personal history of nicotine dependence: Secondary | ICD-10-CM | POA: Insufficient documentation

## 2017-10-21 DIAGNOSIS — D090 Carcinoma in situ of bladder: Secondary | ICD-10-CM | POA: Diagnosis not present

## 2017-10-21 DIAGNOSIS — Z8249 Family history of ischemic heart disease and other diseases of the circulatory system: Secondary | ICD-10-CM | POA: Diagnosis not present

## 2017-10-21 DIAGNOSIS — M1990 Unspecified osteoarthritis, unspecified site: Secondary | ICD-10-CM | POA: Insufficient documentation

## 2017-10-21 DIAGNOSIS — C672 Malignant neoplasm of lateral wall of bladder: Secondary | ICD-10-CM | POA: Insufficient documentation

## 2017-10-21 HISTORY — PX: TRANSURETHRAL RESECTION OF BLADDER TUMOR: SHX2575

## 2017-10-21 SURGERY — TURBT (TRANSURETHRAL RESECTION OF BLADDER TUMOR)
Anesthesia: General

## 2017-10-21 MED ORDER — SODIUM CHLORIDE 0.9 % IR SOLN
Status: DC | PRN
  Administered 2017-10-21 (×2): 3000 mL via INTRAVESICAL

## 2017-10-21 MED ORDER — ONDANSETRON 4 MG PO TBDP
4.0000 mg | ORAL_TABLET | Freq: Once | ORAL | Status: AC
  Administered 2017-10-21: 4 mg via ORAL

## 2017-10-21 MED ORDER — SUGAMMADEX SODIUM 500 MG/5ML IV SOLN
INTRAVENOUS | Status: DC | PRN
  Administered 2017-10-21: 105.2 mg via INTRAVENOUS

## 2017-10-21 MED ORDER — ARTIFICIAL TEARS OPHTHALMIC OINT
TOPICAL_OINTMENT | OPHTHALMIC | Status: AC
Start: 2017-10-21 — End: 2017-10-21
  Filled 2017-10-21: qty 7

## 2017-10-21 MED ORDER — CEFAZOLIN SODIUM-DEXTROSE 2-4 GM/100ML-% IV SOLN
2.0000 g | INTRAVENOUS | Status: AC
  Administered 2017-10-21: 2 g via INTRAVENOUS
  Filled 2017-10-21: qty 100

## 2017-10-21 MED ORDER — MIDAZOLAM HCL 2 MG/2ML IJ SOLN
1.0000 mg | Freq: Once | INTRAMUSCULAR | Status: AC | PRN
  Administered 2017-10-21: 2 mg via INTRAVENOUS

## 2017-10-21 MED ORDER — PROPOFOL 10 MG/ML IV BOLUS
INTRAVENOUS | Status: AC
  Filled 2017-10-21: qty 20

## 2017-10-21 MED ORDER — STERILE WATER FOR IRRIGATION IR SOLN
Status: DC | PRN
  Administered 2017-10-21: 500 mL

## 2017-10-21 MED ORDER — LIDOCAINE HCL (CARDIAC) 20 MG/ML IV SOLN
INTRAVENOUS | Status: DC | PRN
  Administered 2017-10-21: 25 mg via INTRAVENOUS
  Administered 2017-10-21: 70 mg via INTRAVENOUS

## 2017-10-21 MED ORDER — SUGAMMADEX SODIUM 500 MG/5ML IV SOLN
INTRAVENOUS | Status: AC
  Filled 2017-10-21: qty 5

## 2017-10-21 MED ORDER — ROCURONIUM BROMIDE 100 MG/10ML IV SOLN
INTRAVENOUS | Status: DC | PRN
  Administered 2017-10-21: 30 mg via INTRAVENOUS

## 2017-10-21 MED ORDER — FENTANYL CITRATE (PF) 100 MCG/2ML IJ SOLN
INTRAMUSCULAR | Status: DC | PRN
  Administered 2017-10-21: 50 ug via INTRAVENOUS

## 2017-10-21 MED ORDER — FENTANYL CITRATE (PF) 100 MCG/2ML IJ SOLN
INTRAMUSCULAR | Status: AC
  Filled 2017-10-21: qty 2

## 2017-10-21 MED ORDER — LACTATED RINGERS IV SOLN
INTRAVENOUS | Status: DC
  Administered 2017-10-21: 1000 mL via INTRAVENOUS

## 2017-10-21 MED ORDER — ONDANSETRON 4 MG PO TBDP
ORAL_TABLET | ORAL | Status: AC
  Filled 2017-10-21: qty 1

## 2017-10-21 MED ORDER — TRAMADOL-ACETAMINOPHEN 37.5-325 MG PO TABS: 1.0000 | ORAL_TABLET | Freq: Four times a day (QID) | ORAL | 5 refills | Status: DC | PRN

## 2017-10-21 MED ORDER — LIDOCAINE HCL (PF) 1 % IJ SOLN
INTRAMUSCULAR | Status: AC
  Filled 2017-10-21: qty 5

## 2017-10-21 MED ORDER — MIDAZOLAM HCL 2 MG/2ML IJ SOLN
INTRAMUSCULAR | Status: AC
  Filled 2017-10-21: qty 2

## 2017-10-21 SURGICAL SUPPLY — 24 items
BAG DRAIN URO TABLE W/ADPT NS (DRAPE) ×3 IMPLANT
BAG HAMPER (MISCELLANEOUS) ×3 IMPLANT
BAG URINE DRAIN TURP 4L (OSTOMY) ×3 IMPLANT
CATH FOLEY 3WAY 30CC 22F (CATHETERS) ×3 IMPLANT
CLOTH BEACON ORANGE TIMEOUT ST (SAFETY) ×3 IMPLANT
ELECT LOOP 22F BIPOLAR SML (ELECTROSURGICAL) ×3
ELECT REM PT RETURN 9FT ADLT (ELECTROSURGICAL) ×3
ELECTRODE LOOP 22F BIPOLAR SML (ELECTROSURGICAL) ×1 IMPLANT
ELECTRODE REM PT RTRN 9FT ADLT (ELECTROSURGICAL) ×1 IMPLANT
FORMALIN 10 PREFIL 120ML (MISCELLANEOUS) ×3 IMPLANT
GLOVE BIO SURGEON STRL SZ8 (GLOVE) ×3 IMPLANT
GLOVE BIOGEL PI IND STRL 7.0 (GLOVE) ×2 IMPLANT
GLOVE BIOGEL PI INDICATOR 7.0 (GLOVE) ×4
GLOVE ECLIPSE 6.5 STRL STRAW (GLOVE) ×3 IMPLANT
GOWN STRL REUS W/ TWL LRG LVL3 (GOWN DISPOSABLE) ×1 IMPLANT
GOWN STRL REUS W/TWL LRG LVL3 (GOWN DISPOSABLE) ×2
GOWN STRL REUS W/TWL XL LVL3 (GOWN DISPOSABLE) ×3 IMPLANT
IV NS IRRIG 3000ML ARTHROMATIC (IV SOLUTION) ×6 IMPLANT
KIT ROOM TURNOVER AP CYSTO (KITS) ×3 IMPLANT
PACK CYSTO (CUSTOM PROCEDURE TRAY) ×3 IMPLANT
PAD ARMBOARD 7.5X6 YLW CONV (MISCELLANEOUS) ×3 IMPLANT
PLUG CATH AND CAP STER (CATHETERS) ×3 IMPLANT
SYR 30ML LL (SYRINGE) ×3 IMPLANT
TOWEL OR 17X26 4PK STRL BLUE (TOWEL DISPOSABLE) ×3 IMPLANT

## 2017-10-21 NOTE — Op Note (Signed)
.  Preoperative diagnosis: bladder tumor  Postoperative diagnosis: Same  Procedure: 1 cystoscopy 2.Transurethral resection of bladder tumor, large  Attending: Rosie Fate  Anesthesia: General  Estimated blood loss: Minimal  Drains: 22 French foley  Specimens: bladder tumor  Antibiotics: ancef  Findings: numerous 1cm papillary tumors involving posterior wall, left and right lateral wall.  Ureteral orifices in normal anatomic location. Nor hydronephrosis or filling defects in either collecting system  Indications: Patient is a 83 year old female with a history of bladder tumor and gross hematuria.  After discussing treatment options, they decided proceed with transurethral resection of a bladder tumor.  Procedure her in detail: The patient was brought to the operating room and a brief timeout was done to ensure correct patient, correct procedure, correct site.  General anesthesia was administered patient was placed in dorsal lithotomy position.  Their genitalia was then prepped and draped in usual sterile fashion.  A rigid 71 French cystoscope was passed in the urethra and the bladder.  Bladder was inspected and we noted numerous 1cm bladder tumors involving 40% of the bladder surface area.  Using the bipolar resectoscope we removed the bladder tumor down to the base. A subsequent muscle deep biopsy was then taken. Hemostasis was then obtained with electrocautery. We then removed the bladder tumor chips and sent them for pathology. We then re-inspected the bladder and found no residual bleeding.  the bladder was then drained, a 22 French foley was placed and this concluded the procedure which was well tolerated by patient.  Complications: None  Condition: Stable, extubated, transferred to PACU  Plan: Patient is to be discharged home and followup in 5 days for foley catheter removal and pathology discussion.

## 2017-10-21 NOTE — Discharge Instructions (Signed)
Indwelling Urinary Catheter Care, Adult Take good care of your catheter to keep it working and to prevent problems. How to wear your catheter Attach your catheter to your leg with tape (adhesive tape) or a leg strap. Make sure it is not too tight. If you use tape, remove any bits of tape that are already on the catheter. How to wear a drainage bag You should have:  A large overnight bag.  A small leg bag.  Overnight Bag You may wear the overnight bag at any time. Always keep the bag below the level of your bladder but off the floor. When you sleep, put a clean plastic bag in a wastebasket. Then hang the bag inside the wastebasket. Leg Bag Never wear the leg bag at night. Always wear the leg bag below your knee. Keep the leg bag secure with a leg strap or tape. How to care for your skin  Clean the skin around the catheter at least once every day.  Shower every day. Do not take baths.  Put creams, lotions, or ointments on your genital area only as told by your doctor.  Do not use powders, sprays, or lotions on your genital area. How to clean your catheter and your skin 1. Wash your hands with soap and water. 2. Wet a washcloth in warm water and gentle (mild) soap. 3. Use the washcloth to clean the skin where the catheter enters your body. Clean downward and wipe away from the catheter in small circles. Do not wipe toward the catheter. 4. Pat the area dry with a clean towel. Make sure to clean off all soap. How to care for your drainage bags Empty your drainage bag when it is ?- full or at least 2-3 times a day. Replace your drainage bag once a month or sooner if it starts to smell bad or look dirty. Do not clean your drainage bag unless told by your doctor. Emptying a drainage bag  Supplies Needed  Rubbing alcohol.  Gauze pad or cotton ball.  Tape or a leg strap.  Steps 1. Wash your hands with soap and water. 2. Separate (detach) the bag from your leg. 3. Hold the bag over  the toilet or a clean container. Keep the bag below your hips and bladder. This stops pee (urine) from going back into the tube. 4. Open the pour spout at the bottom of the bag. 5. Empty the pee into the toilet or container. Do not let the pour spout touch any surface. 6. Put rubbing alcohol on a gauze pad or cotton ball. 7. Use the gauze pad or cotton ball to clean the pour spout. 8. Close the pour spout. 9. Attach the bag to your leg with tape or a leg strap. 10. Wash your hands.  Changing a drainage bag Supplies Needed  Alcohol wipes.  A clean drainage bag.  Adhesive tape or a leg strap.  Steps 1. Wash your hands with soap and water. 2. Separate the dirty bag from your leg. 3. Pinch the rubber catheter with your fingers so that pee does not spill out. 4. Separate the catheter tube from the drainage tube where these tubes connect (at the connection valve). Do not let the tubes touch any surface. 5. Clean the end of the catheter tube with an alcohol wipe. Use a different alcohol wipe to clean the end of the drainage tube. 6. Connect the catheter tube to the drainage tube of the clean bag. 7. Attach the new bag to  the leg with adhesive tape or a leg strap. 8. Wash your hands.  How to prevent infection and other problems  Never pull on your catheter or try to remove it. Pulling can damage tissue in your body.  Always wash your hands before and after touching your catheter.  If a leg strap gets wet, replace it with a dry one.  Drink enough fluids to keep your pee clear or pale yellow, or as told by your doctor.  Do not let the drainage bag or tubing touch the floor.  Wear cotton underwear.  If you are female, wipe from front to back after you poop (have a bowel movement).  Check on the catheter often to make sure it works and the tubing is not twisted. Get help if:  Your pee is cloudy.  Your pee smells unusually bad.  Your pee is not draining into the bag.  Your  tube gets clogged.  Your catheter starts to leak.  Your bladder feels full. Get help right away if:  You have redness, swelling, or pain where the catheter enters your body.  You have fluid, pus, or a bad smell coming from the area where the catheter enters your body.  The area where the catheter enters your body feels warm.  You have a fever.  You have pain in your: ? Stomach (abdomen). ? Legs. ? Lower back. ? Bladder.  You see blood fill the catheter.  Your pee is pink or red.  You feel sick to your stomach (nauseous).  You throw up (vomit).  You have chills.  Your catheter gets pulled out. This information is not intended to replace advice given to you by your health care provider. Make sure you discuss any questions you have with your health care provider. Document Released: 03/30/2013 Document Revised: 10/31/2016 Document Reviewed: 05/18/2014 Elsevier Interactive Patient Education  2018 [employer].  Transurethral Resection of Bladder Tumor, Care After Refer to this sheet in the next few weeks. These instructions provide you with information about caring for yourself after your procedure. Your health care provider may also give you more specific instructions. Your treatment has been planned according to current medical practices, but problems sometimes occur. Call your health care provider if you have any problems or questions after your procedure. What can I expect after the procedure? After the procedure, it is common to have:  A small amount of blood in your urine for up to 2 weeks.  Soreness or mild discomfort from your catheter. After your catheter is removed, you may have mild soreness, especially when urinating.  Pain in your lower abdomen.  Follow these instructions at home:  Medicines  Take over-the-counter and prescription medicines only as told by your health care provider.  Do not drive or operate heavy machinery while taking prescription pain  medicine.  Do not drive for 24 hours if you received a sedative.  If you were prescribed antibiotic medicine, take it as told by your health care provider. Do not stop taking the antibiotic even if you start to feel better. Activity  Return to your normal activities as told by your health care provider. Ask your health care provider what activities are safe for you.  Do not lift anything that is heavier than 10 lb (4.5 kg) for as long as told by your health care provider.  Avoid intense physical activity for as long as told by your health care provider.  Walk at least one time every day. This helps to  prevent blood clots. You may increase your physical activity gradually as you start to feel better. General instructions  Do not drink alcohol for as long as told by your health care provider. This is especially important if you are taking prescription pain medicines.  Do not take baths, swim, or use a hot tub until your health care provider approves.  If you have a catheter, follow instructions from your health care provider about caring for your catheter and your drainage bag.  Drink enough fluid to keep your urine clear or pale yellow.  Wear compression stockings as told by your health care provider. These stockings help to prevent blood clots and reduce swelling in your legs.  Keep all follow-up visits as told by your health care provider. This is important. Contact a health care provider if:  You have pain that gets worse or does not improve with medicine.  You have blood in your urine for more than 2 weeks.  You have cloudy or bad-smelling urine.  You become constipated. Signs of constipation may include having: ? Fewer than three bowel movements in a week. ? Difficulty having a bowel movement. ? Stools that are dry, hard, or larger than normal.  You have a fever. Get help right away if:  You have: ? Severe pain. ? Bright-red blood in your urine. ? Blood clots in your  urine. ? A lot of blood in your urine.  Your catheter has been removed and you are not able to urinate.  You have a catheter in place and the catheter is not draining urine. This information is not intended to replace advice given to you by your health care provider. Make sure you discuss any questions you have with your health care provider. Document Released: 11/14/2015 Document Revised: 08/05/2016 Document Reviewed: 08/25/2015 Elsevier Interactive Patient Education  2018 [employer].

## 2017-10-21 NOTE — Transfer of Care (Signed)
Immediate Anesthesia Transfer of Care Note  Patient: Sherry [name]  Procedure(s) Performed: TRANSURETHRAL RESECTION OF BLADDER TUMOR (TURBT) (N/A )  Patient Location: PACU  Anesthesia Type:General  Level of Consciousness: awake, oriented and patient cooperative  Airway & Oxygen Therapy: Patient Spontanous Breathing and Patient connected to face mask oxygen  Post-op Assessment: Report given to RN and Post -op Vital signs reviewed and stable  Post vital signs: Reviewed and stable  Last Vitals:  Vitals:   10/21/17 1120 10/21/17 1125  BP: 139/73 139/82  Pulse:    Resp: (!) 24 17  Temp:    SpO2: 100% 100%    Last Pain:  Vitals:   10/21/17 1014  TempSrc: Oral      Patients Stated Pain Goal: 8 (47/09/62 8366)  Complications: No apparent anesthesia complications

## 2017-10-21 NOTE — Anesthesia Procedure Notes (Signed)
Procedure Name: Intubation Date/Time: 10/21/2017 11:40 AM Performed by: Andree Elk, Tyrrell Stephens A, CRNA Pre-anesthesia Checklist: Patient identified, Patient being monitored, Timeout performed, Emergency Drugs available and Suction available Patient Re-evaluated:Patient Re-evaluated prior to induction Oxygen Delivery Method: Circle System Utilized Preoxygenation: Pre-oxygenation with 100% oxygen Induction Type: IV induction Ventilation: Mask ventilation without difficulty Laryngoscope Size: Mac and 3 Grade View: Grade I Tube type: Oral Tube size: 7.0 mm Number of attempts: 1 Airway Equipment and Method: Stylet Placement Confirmation: ETT inserted through vocal cords under direct vision,  positive ETCO2 and breath sounds checked- equal and bilateral Secured at: 21 cm Tube secured with: Tape Dental Injury: Teeth and Oropharynx as per pre-operative assessment

## 2017-10-21 NOTE — Addendum Note (Signed)
Addendum  created 10/21/17 1325 by Vista Deck, CRNA   Sign clinical note, SmartForm saved

## 2017-10-21 NOTE — H&P (Signed)
Urology Admission H&P  Chief Complaint: bladder cancer  History of Present Illness: Sherry [name] is a 83yo with bladder cancer found on cystoscopy.  Past Medical History:  Diagnosis Date  . Anemia   . Arthritis   . Bronchitis   . CAD (coronary artery disease)   . COPD (chronic obstructive pulmonary disease) ([place])   . Emphysema of lung ([place])   . GERD (gastroesophageal reflux disease)   . Headache(784.0)   . History of blood transfusion   . History of kidney stones   . Hyperlipidemia   . Hypertension   . Hypothyroidism   . Osteoporosis   . Rectal adenocarcinoma ([place]) dx'd 08/2009  . Rectal cancer ([place])   . Scoliosis    per Dr Melony Overly ortho  . Shingles   . Shortness of breath   . Tendon adhesions    torn tendon right shoulder   Past Surgical History:  Procedure Laterality Date  . APPENDECTOMY    . CARDIAC CATHETERIZATION    . CHOLECYSTECTOMY    . COLON REMOVAL     8 " 12/2009 DR TSUEI  . COLON SURGERY     apr for rectal cancer 2011  . COLONOSCOPY    . COLOSTOMY    . EYE SURGERY     Cataract with lens- bil  . OOPHORECTOMY    . ovary tumor removal     after son was born  . PARASTOMAL HERNIA REPAIR  10/30/2012  . POLYPECTOMY    . RECTUM REMOVAL     1/11 DR TSUEI  . TUBAL LIGATION    . VAGINAL HYSTERECTOMY      Home Medications:  Current Facility-Administered Medications  Medication Dose Route Frequency Provider Last Rate Last Dose  . ceFAZolin (ANCEF) IVPB 2g/100 mL premix  2 g Intravenous 30 min Pre-Op Alyson Ingles Candee Furbish, MD      . lactated ringers infusion   Intravenous Continuous Mikey College, MD 10 mL/hr at 10/21/17 1040 1,000 mL at 10/21/17 1040   Allergies:  Allergies  Allergen Reactions  . Percocet [Oxycodone-Acetaminophen] Other (See Comments)    Upsets stomach- does tolerate   . Influenza Virus Vacc Split Pf Other (See Comments)    Unknown  But had to be hospitalized -1966  . Sulfamethoxazole-Trimethoprim Nausea And Vomiting  . Hibiclens  [Chlorhexidine Gluconate] Rash    Family History  Problem Relation Age of Onset  . Heart failure Sister   . Cervical cancer Sister   . Heart failure Brother   . Colon cancer Brother   . Colon cancer Brother   . Heart failure Father   . Cancer Mother        HEAD AND NECK  . Cervical cancer Daughter   . Kidney cancer Daughter    Social History:  reports that she quit smoking about 13 years ago. Her smoking use included cigarettes. She has a 97.50 pack-year smoking history. she has never used smokeless tobacco. She reports that she does not drink alcohol or use drugs.  Review of Systems  Genitourinary: Positive for hematuria.  All other systems reviewed and are negative.   Physical Exam:  Vital signs in last 24 hours: Temp:  [98.2 F (36.8 C)-98.5 F (36.9 C)] 98.2 F (36.8 C) (11/05 1014) Pulse Rate:  [77] 77 (11/05 1014) Resp:  [16-39] 25 (11/05 1050) BP: (153-172)/(82-92) 153/82 (11/05 1050) SpO2:  [96 %-100 %] 100 % (11/05 1050) Weight:  [52.6 kg (116 lb)] 52.6 kg (116 lb) (11/05 1014) Physical Exam  Constitutional: She is oriented to person, place, and time. She appears well-developed and well-nourished.  HENT:  Head: Normocephalic and atraumatic.  Eyes: EOM are normal. Pupils are equal, round, and reactive to light.  Neck: Normal range of motion. No thyromegaly present.  Cardiovascular: Normal rate and regular rhythm.  Respiratory: Effort normal. No respiratory distress.  GI: Soft. She exhibits no distension.  Musculoskeletal: Normal range of motion. She exhibits no edema.  Neurological: She is alert and oriented to person, place, and time.  Skin: Skin is warm and dry.  Psychiatric: She has a normal mood and affect. Her behavior is normal. Judgment and thought content normal.    Laboratory Data:  No results found for this or any previous visit (from the past 24 hour(s)). No results found for this or any previous visit (from the past 240 hour(s)). Creatinine: No  results for input(s): CREATININE in the last 168 hours. Baseline Creatinine: unknwon  Impression/Assessment:  83yo with bladder cancer  Plan:  The risks/benefits/alternatives to TURBT was explained to the patient and she understands and wisehs to proceed with surgery  [name] 10/21/2017, 10:57 AM

## 2017-10-21 NOTE — Anesthesia Postprocedure Evaluation (Signed)
Anesthesia Post Note  Patient: Sherry [name]  Procedure(s) Performed: TRANSURETHRAL RESECTION OF BLADDER TUMOR (TURBT) (N/A )  Anesthesia Type: General     Last Vitals:  Vitals:   10/21/17 1300 10/21/17 1315  BP: (!) 150/78 (!) 159/79  Pulse: 68 69  Resp: 19 15  Temp:    SpO2: 99% 100%    Last Pain:  Vitals:   10/21/17 1014  TempSrc: Oral                 Yoandri Congrove

## 2017-10-21 NOTE — Anesthesia Postprocedure Evaluation (Signed)
Anesthesia Post Note  Patient: Sherry [name]  Procedure(s) Performed: TRANSURETHRAL RESECTION OF BLADDER TUMOR (TURBT) (N/A )  Patient location during evaluation: PACU Anesthesia Type: Spinal Level of consciousness: awake and alert Pain management: satisfactory to patient Vital Signs Assessment: vitals unstable Respiratory status: spontaneous breathing Cardiovascular status: stable Postop Assessment: no apparent nausea or vomiting Anesthetic complications: no     Last Vitals:  Vitals:   10/21/17 1300 10/21/17 1315  BP: (!) 150/78 (!) 159/79  Pulse: 68 69  Resp: 19 15  Temp:    SpO2: 99% 100%    Last Pain:  Vitals:   10/21/17 1014  TempSrc: Oral                 Patra Gherardi

## 2017-10-21 NOTE — Anesthesia Preprocedure Evaluation (Signed)
Anesthesia Evaluation  Patient identified by MRN, date of birth, ID band Patient awake    Airway Mallampati: I  TM Distance: >3 FB Neck ROM: Full    Dental  (+) Edentulous Upper, Edentulous Lower   Pulmonary shortness of breath, COPD,  COPD inhaler, former smoker,    Pulmonary exam normal        Cardiovascular Exercise Tolerance: Poor hypertension, Pt. on medications and Pt. on home beta blockers + CAD  Normal cardiovascular exam Rate:Normal     Neuro/Psych  Headaches,    GI/Hepatic GERD  Medicated and Controlled,  Endo/Other  Hypothyroidism   Renal/GU      Musculoskeletal  (+) Arthritis , Results for Sherry [name], Sherry [name] (MRN [id number]) as of 10/21/2017 10:17  09/10/2017 10:16 Sodium: 137 Potassium: 4.4 Chloride: 102 CO2: 27 Glucose: 105 (H) BUN: 16 Creatinine: 0.91 Calcium: 10.1    Abdominal Normal abdominal exam  (+)   Bowel sounds: normal.  Peds  Hematology  (+) anemia , Results for Sherry [name], Sherry [name] (MRN [id number]) as of 10/21/2017 10:17  09/10/2017 10:16 Hemoglobin: 11.3 (L) HCT: 34.3 (L) MCV: 107.2 (H) MCH: 35.3 (H) MCHC: 32.9 RDW: 12.8 Platelets: 279    Anesthesia Other Findings   Reproductive/Obstetrics                             Anesthesia Physical Anesthesia Plan  ASA: III  Anesthesia Plan: Spinal   Post-op Pain Management:    Induction:   PONV Risk Score and Plan: 2  Airway Management Planned: Nasal Cannula  Additional Equipment:   Intra-op Plan:   Post-operative Plan:   Informed Consent: I have reviewed the patients History and Physical, chart, labs and discussed the procedure including the risks, benefits and alternatives for the proposed anesthesia with the patient or authorized representative who has indicated his/her understanding and acceptance.   Dental advisory given  Plan Discussed with: CRNA and Surgeon  Anesthesia Plan Comments:          Anesthesia Quick Evaluation

## 2017-10-22 ENCOUNTER — Other Ambulatory Visit: Payer: Self-pay | Admitting: Internal Medicine

## 2017-10-22 ENCOUNTER — Ambulatory Visit: Payer: Medicare Other | Admitting: Internal Medicine

## 2017-10-22 ENCOUNTER — Encounter (HOSPITAL_COMMUNITY): Payer: Self-pay | Admitting: Urology

## 2017-10-29 ENCOUNTER — Ambulatory Visit: Payer: Medicare Other | Admitting: Internal Medicine

## 2017-10-30 ENCOUNTER — Ambulatory Visit (INDEPENDENT_AMBULATORY_CARE_PROVIDER_SITE_OTHER): Payer: Medicare Other | Admitting: Urology

## 2017-10-30 DIAGNOSIS — C642 Malignant neoplasm of left kidney, except renal pelvis: Secondary | ICD-10-CM

## 2017-10-30 DIAGNOSIS — C678 Malignant neoplasm of overlapping sites of bladder: Secondary | ICD-10-CM | POA: Diagnosis not present

## 2017-10-31 ENCOUNTER — Ambulatory Visit: Payer: Medicare Other | Admitting: Internal Medicine

## 2017-11-06 ENCOUNTER — Other Ambulatory Visit: Payer: Self-pay | Admitting: Internal Medicine

## 2017-11-22 ENCOUNTER — Ambulatory Visit: Payer: Medicare Other | Admitting: Urology

## 2017-11-29 ENCOUNTER — Ambulatory Visit: Payer: Medicare Other | Admitting: Urology

## 2017-12-17 ENCOUNTER — Other Ambulatory Visit: Payer: Self-pay

## 2017-12-17 ENCOUNTER — Emergency Department (HOSPITAL_COMMUNITY): Payer: Medicare Other

## 2017-12-17 ENCOUNTER — Emergency Department (HOSPITAL_COMMUNITY)
Admission: EM | Admit: 2017-12-17 | Discharge: 2017-12-17 | Disposition: A | Discharge status: Home / Self Care | Payer: Medicare Other | Attending: Physician Assistant | Admitting: Physician Assistant

## 2017-12-17 ENCOUNTER — Encounter (HOSPITAL_COMMUNITY): Payer: Self-pay

## 2017-12-17 DIAGNOSIS — R404 Transient alteration of awareness: Secondary | ICD-10-CM | POA: Diagnosis not present

## 2017-12-17 DIAGNOSIS — E039 Hypothyroidism, unspecified: Secondary | ICD-10-CM | POA: Insufficient documentation

## 2017-12-17 DIAGNOSIS — M546 Pain in thoracic spine: Secondary | ICD-10-CM | POA: Diagnosis not present

## 2017-12-17 DIAGNOSIS — R1011 Right upper quadrant pain: Secondary | ICD-10-CM | POA: Insufficient documentation

## 2017-12-17 DIAGNOSIS — M5489 Other dorsalgia: Secondary | ICD-10-CM | POA: Diagnosis not present

## 2017-12-17 DIAGNOSIS — Z79899 Other long term (current) drug therapy: Secondary | ICD-10-CM | POA: Diagnosis not present

## 2017-12-17 DIAGNOSIS — I2510 Atherosclerotic heart disease of native coronary artery without angina pectoris: Secondary | ICD-10-CM | POA: Diagnosis not present

## 2017-12-17 DIAGNOSIS — J449 Chronic obstructive pulmonary disease, unspecified: Secondary | ICD-10-CM | POA: Insufficient documentation

## 2017-12-17 DIAGNOSIS — R531 Weakness: Secondary | ICD-10-CM | POA: Diagnosis not present

## 2017-12-17 DIAGNOSIS — Z87891 Personal history of nicotine dependence: Secondary | ICD-10-CM | POA: Insufficient documentation

## 2017-12-17 DIAGNOSIS — I10 Essential (primary) hypertension: Secondary | ICD-10-CM | POA: Diagnosis not present

## 2017-12-17 DIAGNOSIS — R109 Unspecified abdominal pain: Secondary | ICD-10-CM | POA: Diagnosis not present

## 2017-12-17 LAB — URINALYSIS, ROUTINE W REFLEX MICROSCOPIC
BILIRUBIN URINE: NEGATIVE
Glucose, UA: NEGATIVE mg/dL
Ketones, ur: 20 mg/dL — AB
NITRITE: NEGATIVE
PH: 6 (ref 5.0–8.0)
Protein, ur: 100 mg/dL — AB
SPECIFIC GRAVITY, URINE: 1.017 (ref 1.005–1.030)

## 2017-12-17 LAB — COMPREHENSIVE METABOLIC PANEL
ALK PHOS: 84 U/L (ref 38–126)
ALT: 10 U/L — ABNORMAL LOW (ref 14–54)
AST: 15 U/L (ref 15–41)
Albumin: 3.7 g/dL (ref 3.5–5.0)
Anion gap: 7 (ref 5–15)
BUN: 17 mg/dL (ref 6–20)
CALCIUM: 9.9 mg/dL (ref 8.9–10.3)
CO2: 25 mmol/L (ref 22–32)
CREATININE: 0.83 mg/dL (ref 0.44–1.00)
Chloride: 104 mmol/L (ref 101–111)
GFR calc non Af Amer: 59 mL/min — ABNORMAL LOW (ref >60)
GLUCOSE: 92 mg/dL (ref 65–99)
Potassium: 4 mmol/L (ref 3.5–5.1)
SODIUM: 136 mmol/L (ref 135–145)
TOTAL PROTEIN: 6.6 g/dL (ref 6.5–8.1)
Total Bilirubin: 0.9 mg/dL (ref 0.3–1.2)

## 2017-12-17 LAB — CBC WITH DIFFERENTIAL/PLATELET
BASOS ABS: 0 10*3/uL (ref 0.0–0.1)
BASOS PCT: 0 %
EOS ABS: 0.1 10*3/uL (ref 0.0–0.7)
Eosinophils Relative: 1 %
HCT: 37.6 % (ref 36.0–46.0)
Hemoglobin: 12.1 g/dL (ref 12.0–15.0)
Lymphocytes Relative: 8 %
Lymphs Abs: 0.5 10*3/uL — ABNORMAL LOW (ref 0.7–4.0)
MCH: 30.2 pg (ref 26.0–34.0)
MCHC: 32.2 g/dL (ref 30.0–36.0)
MCV: 93.8 fL (ref 78.0–100.0)
MONO ABS: 0.5 10*3/uL (ref 0.1–1.0)
MONOS PCT: 8 %
NEUTROS PCT: 83 %
Neutro Abs: 5.6 10*3/uL (ref 1.7–7.7)
Platelets: 242 10*3/uL (ref 150–400)
RBC: 4.01 MIL/uL (ref 3.87–5.11)
RDW: 15.2 % (ref 11.5–15.5)
WBC: 6.7 10*3/uL (ref 4.0–10.5)

## 2017-12-17 MED ORDER — IOPAMIDOL (ISOVUE-300) INJECTION 61%
INTRAVENOUS | Status: AC
  Administered 2017-12-17: 75 mL via INTRAVENOUS
  Filled 2017-12-17: qty 100

## 2017-12-17 MED ORDER — DEXTROSE 5 % IV SOLN
1.0000 g | Freq: Once | INTRAVENOUS | Status: AC
  Administered 2017-12-17: 1 g via INTRAVENOUS
  Filled 2017-12-17: qty 10

## 2017-12-17 MED ORDER — MORPHINE SULFATE (PF) 2 MG/ML IV SOLN
2.0000 mg | Freq: Once | INTRAVENOUS | Status: AC
  Administered 2017-12-17: 2 mg via INTRAVENOUS
  Filled 2017-12-17: qty 1

## 2017-12-17 MED ORDER — HYDROCODONE-ACETAMINOPHEN 5-325 MG PO TABS: 1.0000 | ORAL_TABLET | ORAL | 0 refills | Status: DC | PRN

## 2017-12-17 MED ORDER — SODIUM CHLORIDE 0.9 % IV BOLUS (SEPSIS)
1000.0000 mL | Freq: Once | INTRAVENOUS | Status: AC
  Administered 2017-12-17: 1000 mL via INTRAVENOUS

## 2017-12-17 MED ORDER — IOPAMIDOL (ISOVUE-370) INJECTION 76%: 100.0000 mL | Freq: Once | INTRAVENOUS | Status: DC | PRN

## 2017-12-17 MED ORDER — CEPHALEXIN 500 MG PO CAPS: 500.0000 mg | ORAL_CAPSULE | Freq: Four times a day (QID) | ORAL | 0 refills | Status: DC

## 2017-12-17 MED ORDER — KETOROLAC TROMETHAMINE 30 MG/ML IJ SOLN
15.0000 mg | Freq: Once | INTRAMUSCULAR | Status: AC
  Administered 2017-12-17: 15 mg via INTRAVENOUS
  Filled 2017-12-17: qty 1

## 2017-12-17 NOTE — ED Notes (Signed)
Bed: BT51 Expected date: 12/17/17 Expected time: 1:56 PM Means of arrival: Ambulance Comments: [place] pt Back pain

## 2017-12-17 NOTE — ED Triage Notes (Signed)
patient states she has pain in the right mid back.

## 2017-12-17 NOTE — ED Provider Notes (Signed)
[facility] DEPT Provider Note   CSN: [id number] Arrival date & time: 12/17/17  1354     History   Chief Complaint Chief Complaint  Patient presents with  . Back Pain    HPI Sherry [name] is a 84 y.o. female.  HPI   Sherry [name] is a 84 y.o. female, with a history of kidney and bladder tumors, kidney stones, HTN, hypothyroidism, and COPD, presenting to the ED with right midback pain for the past two days. Pain is intermittent, described as "cutting," 8/10, nonradiating. Accompanied by nausea.   History of kidney and bladder cancer. Managed by Dr. Alyson Ingles, urology, and Dr. Alen Blew, oncology. She has a right ureteral stent placed Oct 22, 2017 that she states needs to be changed out in March 2019. Bladder tumors were discovered and resected in Nov 2018 as well.   Has been getting adequate relief with tylenol.   Denies fever/chills, vomiting, diarrhea, hematuria, abdominal pain, SOB, CP, or any other complaints.     Past Medical History:  Diagnosis Date  . Anemia   . Arthritis   . Bronchitis   . CAD (coronary artery disease)   . COPD (chronic obstructive pulmonary disease) ([place])   . Emphysema of lung ([place])   . GERD (gastroesophageal reflux disease)   . Headache(784.0)   . History of blood transfusion   . History of kidney stones   . Hyperlipidemia   . Hypertension   . Hypothyroidism   . Osteoporosis   . Rectal adenocarcinoma ([place]) dx'd 08/2009  . Rectal cancer ([place])   . Scoliosis    per Dr Melony Overly ortho  . Shingles   . Shortness of breath   . Tendon adhesions    torn tendon right shoulder    Patient Active Problem List   Diagnosis Date Noted  . Hematuria, gross 01/28/2017  . Acquired pelvic enterocele 12/28/2013  . Unsteady gait 11/26/2013  . Colostomy care ([place]) 08/21/2013  . Renal lesion 07/26/2013  . Skin lesion - left lower quadrant abdominal wall 01/21/2013  . Parastomal hernia without obstruction or gangrene  09/26/2012  . PARESTHESIA, HANDS 08/15/2010  . ADENOCARCINOMA, RECTUM 09/01/2009  . Hypothyroidism 09/01/2009  . Dyslipidemia 09/01/2009  . ANEMIA-IRON DEFICIENCY 09/01/2009  . Essential hypertension 09/01/2009  . COPD 09/01/2009  . OSTEOPOROSIS 09/01/2009    Past Surgical History:  Procedure Laterality Date  . APPENDECTOMY    . BALLOON DILATION Left 04/10/2017   Procedure: BALLOON DILATION OF LEFT URETERAL STRICTURE;  Surgeon: Cleon Gustin, MD;  Location: [facility];  Service: Urology;  Laterality: Left;  . CARDIAC CATHETERIZATION    . CHOLECYSTECTOMY    . COLON REMOVAL     8 " 12/2009 DR TSUEI  . COLON SURGERY     apr for rectal cancer 2011  . COLONOSCOPY    . COLOSTOMY    . CYSTOSCOPY W/ URETERAL STENT PLACEMENT Left 03/18/2017   Procedure: CYSTOSCOPY WITH RETROGRADE PYELOGRAM/URETERAL STENT PLACEMENT AND URETERAL DIAGNOSTIC LEFT;  Surgeon: Cleon Gustin, MD;  Location: [facility];  Service: Urology;  Laterality: Left;  1 HR [phone] [id number] A [phone] - pt to arrive at 7:00 to receive blood  . CYSTOSCOPY W/ URETERAL STENT PLACEMENT Left 04/10/2017   Procedure: CYSTOSCOPY WITH STENT REPLACEMENT;  Surgeon: Cleon Gustin, MD;  Location: [facility];  Service: Urology;  Laterality: Left;  . CYSTOSCOPY W/ URETERAL STENT PLACEMENT Left 06/03/2017   Procedure: CYSTOSCOPY WITH LEFT RETROGRADE PYELOGRAM/LEFT URETERAL  BALLOON DILATION AND LEFT URETERAL  STENT PLACEMENT;  Surgeon: Cleon Gustin, MD;  Location: [facility];  Service: Urology;  Laterality: Left;  . CYSTOSCOPY W/ URETERAL STENT PLACEMENT Left 09/16/2017   Procedure: CYSTOSCOPY WITH LEFT STENT EXCHANGE/ RETROGRADE;  Surgeon: Cleon Gustin, MD;  Location: [facility];  Service: Urology;  Laterality: Left;  NEEDS TOTAL 30 MIN  . CYSTOSCOPY WITH RETROGRADE PYELOGRAM, URETEROSCOPY AND STENT PLACEMENT Left 03/29/2016   Procedure: CYSTOSCOPY WITH RETROGRADE PYELOGRAM, URETEROSCOPY AND STENT PLACEMENT;  Surgeon:  Kathie Rhodes, MD;  Location: [facility];  Service: Urology;  Laterality: Left;  With Laser  . CYSTOSCOPY/RETROGRADE/URETEROSCOPY Left 01/28/2017   Procedure: CYSTOSCOPY/RETROGRADE/URETEROSCOPY (FLEX 6 fr) THULIUM LASER OF TUMOR;  Surgeon: Franchot Gallo, MD;  Location: [facility];  Service: Urology;  Laterality: Left;  . CYSTOSCOPY/RETROGRADE/URETEROSCOPY Left 04/10/2017   Procedure: CYSTOSCOPY, LEFT RETROGRADE PYELOGRAM LEFT URETEROSCOPY;  Surgeon: Cleon Gustin, MD;  Location: [facility];  Service: Urology;  Laterality: Left;  . EYE SURGERY     Cataract with lens- bil  . HOLMIUM LASER APPLICATION Left 2/67/1245   Procedure: HOLMIUM LASER ABLATION LEFT URETERAL TUMOR;  Surgeon: Cleon Gustin, MD;  Location: [facility];  Service: Urology;  Laterality: Left;  . OOPHORECTOMY    . ovary tumor removal     after son was born  . PARASTOMAL HERNIA REPAIR  10/30/2012  . POLYPECTOMY    . RECTUM REMOVAL     1/11 DR TSUEI  . TRANSURETHRAL RESECTION OF BLADDER TUMOR N/A 10/21/2017   Procedure: TRANSURETHRAL RESECTION OF BLADDER TUMOR (TURBT);  Surgeon: Cleon Gustin, MD;  Location: [facility];  Service: Urology;  Laterality: N/A;  . TUBAL LIGATION    . VAGINAL HYSTERECTOMY    . VENTRAL HERNIA REPAIR  10/30/2012   Procedure: LAPAROSCOPIC VENTRAL HERNIA;  Surgeon: Imogene Burn. Georgette Dover, MD;  Location: [facility];  Service: General;  Laterality: N/A;  Laparoscopic repair of parastomal hernia    OB History    No data available       Home Medications    Prior to Admission medications   Medication Sig Start Date End Date Taking? Authorizing Provider  acetaminophen (TYLENOL) 325 MG tablet Take 650 mg by mouth every 6 (six) hours as needed for mild pain or headache.    Yes [provider]  atorvastatin (LIPITOR) 10 MG tablet TAKE 1 TABLET DAILY 11/06/17  Yes Marletta Lor, MD  diltiazem [facility]) 180 MG 24 hr capsule TAKE 1 CAPSULE DAILY 05/14/17  Yes Marletta Lor, MD  levothyroxine  (SYNTHROID, LEVOTHROID) 75 MCG tablet TAKE 1 TABLET DAILY 02/18/17  Yes Marletta Lor, MD  meclizine (ANTIVERT) 25 MG tablet Take 1 tablet (25 mg total) by mouth 3 (three) times daily as needed for dizziness. 10/26/14  Yes Marletta Lor, MD  PROAIR HFA 108 [phone] Base) MCG/ACT inhaler USE 2 INHALATIONS INTO THE LUNGS EVERY 4 HOURS AS NEEDED FOR SHORTNESS OF BREATH 02/14/16  Yes Marletta Lor, MD  TOPROL XL 25 MG 24 hr tablet TAKE ONE-HALF (1/2) TABLET DAILY 10/22/17  Yes Marletta Lor, MD  traMADol-acetaminophen (ULTRACET) 37.5-325 MG tablet Take 1 tablet every 6 (six) hours as needed by mouth for moderate pain. 10/21/17  Yes McKenzie, Candee Furbish, MD  cephALEXin (KEFLEX) 500 MG capsule Take 1 capsule (500 mg total) by mouth 4 (four) times daily for 10 days. 12/17/17 12/27/17  [place] Leap, Helane Gunther, PA-C  HYDROcodone-acetaminophen (NORCO/VICODIN) 5-325 MG tablet Take 1 tablet by mouth every 4 (four) hours as needed for severe  pain. 12/17/17   Tyrae Alcoser, Helane Gunther, PA-C    Family History Family History  Problem Relation Age of Onset  . Heart failure Sister   . Cervical cancer Sister   . Heart failure Brother   . Colon cancer Brother   . Colon cancer Brother   . Heart failure Father   . Cancer Mother        HEAD AND NECK  . Cervical cancer Daughter   . Kidney cancer Daughter     Social History Social History   Tobacco Use  . Smoking status: Former Smoker    Packs/day: 1.50    Years: 65.00    Pack years: 97.50    Types: Cigarettes    Last attempt to quit: 12/18/2003    Years since quitting: 14.0  . Smokeless tobacco: Never Used  . Tobacco comment: quit 2005  Substance Use Topics  . Alcohol use: No    Alcohol/week: 0.0 oz  . Drug use: No     Allergies   Percocet [oxycodone-acetaminophen]; Influenza virus vacc split pf; Sulfamethoxazole-trimethoprim; and Hibiclens [chlorhexidine gluconate]   Review of Systems Review of Systems  Constitutional: Negative for chills and fever.    Respiratory: Negative for shortness of breath.   Cardiovascular: Negative for chest pain.  Gastrointestinal: Positive for nausea. Negative for abdominal pain, blood in stool, diarrhea and vomiting.  Genitourinary: Negative for difficulty urinating, dysuria and hematuria.  Musculoskeletal: Positive for back pain.  All other systems reviewed and are negative.    Physical Exam Updated Vital Signs BP (!) 117/97 (BP Location: Left Arm)   Pulse 77   Temp 98.1 F (36.7 C) (Oral)   Resp 16   Ht 5\' 3"  (1.6 m)   Wt 52.6 kg (116 lb)   SpO2 97%   BMI 20.55 kg/m   Physical Exam  Constitutional: She appears well-developed and well-nourished. No distress.  HENT:  Head: Normocephalic and atraumatic.  Eyes: Conjunctivae are normal.  Neck: Neck supple.  Cardiovascular: Normal rate, regular rhythm, normal heart sounds and intact distal pulses.  Pulmonary/Chest: Effort normal and breath sounds normal. No respiratory distress.  Abdominal: Soft. There is no tenderness. There is CVA tenderness (right). There is no guarding.  Musculoskeletal: She exhibits no edema.  Lymphadenopathy:    She has no cervical adenopathy.  Neurological: She is alert.  Skin: Skin is warm and dry. She is not diaphoretic.  Psychiatric: She has a normal mood and affect. Her behavior is normal.  Nursing note and vitals reviewed.    ED Treatments / Results  Labs (all labs ordered are listed, but only abnormal results are displayed) Labs Reviewed  URINALYSIS, ROUTINE W REFLEX MICROSCOPIC - Abnormal; Notable for the following components:      Result Value   APPearance CLOUDY (*)    Hgb urine dipstick MODERATE (*)    Ketones, ur 20 (*)    Protein, ur 100 (*)    Leukocytes, UA LARGE (*)    Bacteria, UA MANY (*)    Squamous Epithelial / LPF 0-5 (*)    Non Squamous Epithelial 0-5 (*)    All other components within normal limits  COMPREHENSIVE METABOLIC PANEL - Abnormal; Notable for the following components:   ALT  10 (*)    GFR calc non Af Amer 59 (*)    All other components within normal limits  CBC WITH DIFFERENTIAL/PLATELET - Abnormal; Notable for the following components:   Lymphs Abs 0.5 (*)    All other components within normal  limits  URINE CULTURE    EKG  EKG Interpretation None       Radiology Ct Abdomen Pelvis W Contrast  Result Date: 12/17/2017 CLINICAL DATA:  Flank pain, recurrent stone disease suspected. Patient is status post transurethral resection of bladder tumor on 10/21/2017. EXAM: CT ABDOMEN AND PELVIS WITH CONTRAST TECHNIQUE: Multidetector CT imaging of the abdomen and pelvis was performed using the standard protocol following bolus administration of intravenous contrast. CONTRAST:  64mL ISOVUE-300 IOPAMIDOL (ISOVUE-300) INJECTION 61% COMPARISON:  02/06/2017 and 02/10/2016 CT exams FINDINGS: Lower chest: Stable cardiomegaly. Linear atelectasis or scarring in both lower lobes. No pericardial effusion or thickening. Hepatobiliary: Scattered hepatic granulomas are re- demonstrated with too small to characterize hepatic hypodensities in the right hepatic lobe, series 2, images 8 and 15. These appear stable. Status post cholecystectomy. No choledocholithiasis. Pancreas: Atrophic without acute abnormality. Spleen: Multiple calcified granulomas. Nonspecific 18 mm in diameter hypodensity involving the medial lower pole with smaller subcentimeter rounded hypodensities also noted potentially representing small cysts or potentially hemangiomata. The larger hypodensity is not apparent on the previous enhanced CT from 02/10/2016 suggesting a possible hemangioma. Metastatic disease would be unusual. Infectious or inflammatory change or other potential etiologies. The subcentimeter hypodensities are stable in appearance dating back to 02/10/2016 comparison. Adrenals/Urinary Tract: Ovoid nodularity of both adrenal apices appear stable. No significant change nor worrisome features are identified. The  right kidney again demonstrates multiple cysts with mild diffuse renal cortical thinning. Moderate-to-marked renal cortical thinning and scarring is seen of the left kidney especially in the upper pole with indwelling double pigtail ureteral stent. Periureteric fat stranding is seen along the course of the proximal left ureter likely representing post treatment change. Stable exophytic circumscribed 8 mm hypodensity off the upper pole the left kidney, too small to further characterize but not definitively simple in appearance. Attention on follow-up is recommended. No significant hydroureteronephrosis in the setting of the stent. No obstructing calculus is identified along the course of the ureters. The distal coil of the ureteral stent is seen within the bladder. Stomach/Bowel: Left lower quadrant colostomy with small parastomal hernia as previously noted. A large amount of fecal retention is seen within the colon. Vascular/Lymphatic: Re- demonstration of focal left eccentric aortic aneurysm with maximum transverse dimension of 4.6 cm versus 4.2 cm previously. Extensive aorto bi-iliac atherosclerosis and branch vessel atherosclerosis is noted. No enlarged lymph nodes. Reproductive: Status post hysterectomy. No adnexal masses. Other: No free air free fluid. Musculoskeletal: No aggressive lytic or blastic disease. Degenerative disc disease L4-5 with vacuum disc phenomenon and grade 1 anterolisthesis chronic posttraumatic change of the right inferior pubic ramus with healing. IMPRESSION: 1. Post treatment change suspected about the left kidney and proximal ureter with renal cortical scarring and periureteric hazy fatty induration along the proximal left ureter in the setting of an indwelling double pigtail ureteral stent. No calculus is seen along the course of either ureter. 2. Increased colonic stool burden noted without bowel obstruction. Left lower quadrant colostomy and stable parastomal hernia. 3. Degenerative  disc disease at L4-5 with grade 1 anterolisthesis of L4 on L5 similar in appearance to prior. 4. Multiple hepatic and splenic granulomas. 5. Scattered subcentimeter hypodensities within the spleen possibly representing small congenital or developmental cysts or hemangiomata. A larger 18 mm hypodensity, not apparent on prior studies is also noted along the medial aspect of the spleen potentially representing a larger hemangioma. Stigmata of infection or inflammatory change is not entirely excluded but believed less likely. Like wise, metastasis  to the spleen would be uncommon. 6. 4.6 cm infrarenal saccular aneurysm versus 4.2 cm previously. Recommend followup by abdomen and pelvis CTA in 6 months, and vascular surgery referral/consultation if not already obtained. This recommendation follows ACR consensus guidelines: White Paper of the ACR Incidental Findings Committee II on Vascular Findings. J Am Coll Radiol 2013; 10:789-794. 7. Degenerative disc disease L4-5 with stable grade 1 anterolisthesis of L4 on L5. Electronically Signed   By: Ashley Royalty M.D.   On: 12/17/2017 19:17    Procedures Procedures (including critical care time)  Medications Ordered in ED Medications  iopamidol (ISOVUE-370) 76 % injection 100 mL (not administered)  ketorolac (TORADOL) 30 MG/ML injection 15 mg (15 mg Intravenous Given 12/17/17 1727)  iopamidol (ISOVUE-300) 61 % injection (75 mLs Intravenous Contrast Given 12/17/17 1838)  sodium chloride 0.9 % bolus 1,000 mL (1,000 mLs Intravenous New Bag/Given 12/17/17 1927)  morphine 2 MG/ML injection 2 mg (2 mg Intravenous Given 12/17/17 2013)  cefTRIAXone (ROCEPHIN) 1 g in dextrose 5 % 50 mL IVPB (1 g Intravenous New Bag/Given 12/17/17 2014)     Initial Impression / Assessment and Plan / ED Course  I have reviewed the triage vital signs and the nursing notes.  Pertinent labs & imaging results that were available during my care of the patient were reviewed by me and considered in my  medical decision making (see chart for details).     Patient presents with right mid back pain.  No acute abnormality noted in this region on CT.  Urinalysis with possible indicators for pyelonephritis. Urologist follow-up.  The patient was given instructions for home care as well as return precautions. Patient voices understanding of these instructions, accepts the plan, and is comfortable with discharge.   Patient's presentation gives suspicion for renal stone, however, patient also has history of renal cancer. In the past, she has had contrast studies for evaluation of similar pain, therefore, a contrast study was thought to be more appropriate.   Findings and plan of care discussed with Zenovia Jarred, MD. Dr. Thomasene Lot personally evaluated and examined this patient.  Vitals:   12/17/17 1800 12/17/17 1830 12/17/17 1907 12/17/17 2108  BP: 102/82 113/74 126/74 (!) 135/92  Pulse: 77 72 68 73  Resp:   15 14  Temp:      TempSrc:      SpO2: 95% 97% 95% 97%  Weight:      Height:          Final Clinical Impressions(s) / ED Diagnoses   Final diagnoses:  Acute right-sided thoracic back pain    ED Discharge Orders        Ordered    HYDROcodone-acetaminophen (NORCO/VICODIN) 5-325 MG tablet  Every 4 hours PRN     12/17/17 2125    cephALEXin (KEFLEX) 500 MG capsule  4 times daily     12/17/17 2128       Lorayne Bender, PA-C 12/17/17 2132    Macarthur Critchley, MD 12/17/17 2340

## 2017-12-17 NOTE — ED Triage Notes (Signed)
Per EMS- patient c/o right lower back pain x 2-3 days. Patient has a history of kidney and bladder cancer.

## 2017-12-17 NOTE — Discharge Instructions (Signed)
There was evidence of a possible urinary infection that could be causing your pain.  There were recommendations for repeat CT scans to reevaluate incidental abnormalities noted on the scan today. Please see those below.  Please take all of your antibiotics until finished!   You may develop abdominal discomfort or diarrhea from the antibiotic.  You may help offset this with probiotics which you can buy or get in yogurt. Do not eat or take the probiotics until 2 hours after your antibiotic.  Antiinflammatory medications: Take 600 mg of ibuprofen every 6 hours or 440 mg (over the counter dose) to 500 mg (prescription dose) of naproxen every 12 hours for the next 3 days. After this time, these medications may be used as needed for pain. Take these medications with food to avoid upset stomach. Choose only one of these medications, do not take them together. Tylenol: Should you continue to have additional pain while taking the ibuprofen or naproxen, you may add in tylenol as needed. Your daily total maximum amount of tylenol from all sources should be limited to 4000mg /day for persons without liver problems, or 2000mg /day for those with liver problems. Vicodin: May take Vicodin as needed for severe pain.  Do not drive or perform other dangerous activities while taking the Vicodin.  Please note that each pill of Vicodin contains 325 mg of Tylenol and the above dosage limits apply.  Please follow-up with urology as soon as possible on this matter.  Return to the ED for any worsening symptoms.

## 2017-12-20 LAB — URINE CULTURE

## 2017-12-21 ENCOUNTER — Telehealth: Payer: Self-pay

## 2017-12-21 NOTE — Telephone Encounter (Signed)
Post ED Visit - Positive Culture Follow-up: Successful Patient Follow-Up  Culture assessed and recommendations reviewed by: []  Elenor Quinones, Pharm.D. []  Heide Guile, Pharm.D., BCPS AQ-ID []  Parks Neptune, Pharm.D., BCPS []  Alycia Rossetti, Pharm.D., BCPS []  [place], Pharm.D., BCPS, AAHIVP []  Legrand Como, Pharm.D., BCPS, AAHIVP []  Salome Arnt, PharmD, BCPS [x]  Dimitri Ped, PharmD, BCPS []  Vincenza Hews, PharmD, BCPS  Positive urine culture  []  Patient discharged without antimicrobial prescription and treatment is now indicated [x]  Organism is resistant to prescribed ED discharge antimicrobial []  Patient with positive blood cultures  Changes discussed with ED provider: Alanson Aly Pac New antibiotic prescription Levofloxacin 500mg  QD X 5 days Called to [place]  Contacted patient, date 12/21/17, time 1103   Marianna Cid, [place] Comment 12/21/2017, 11:01 AM

## 2017-12-21 NOTE — Progress Notes (Signed)
ED Antimicrobial Stewardship Positive Culture Follow Up   Sherry [name] is an 84 y.o. female who presented to [facility] on 12/17/2017 with a chief complaint of  Chief Complaint  Patient presents with  . Back Pain    Recent Results (from the past 720 hour(s))  Urine culture     Status: Abnormal   Collection Time: 12/17/17  8:07 PM  Result Value Ref Range Status   Specimen Description URINE, CATHETERIZED  Final   Special Requests NONE  Final   Culture >=100,000 COLONIES/mL SERRATIA MARCESCENS (A)  Final   Report Status 12/20/2017 FINAL  Final   Organism ID, Bacteria SERRATIA MARCESCENS (A)  Final      Susceptibility   Serratia marcescens - MIC*    CEFAZOLIN >=64 RESISTANT Resistant     CEFTRIAXONE <=1 SENSITIVE Sensitive     CIPROFLOXACIN <=0.25 SENSITIVE Sensitive     GENTAMICIN <=1 SENSITIVE Sensitive     NITROFURANTOIN 256 RESISTANT Resistant     TRIMETH/SULFA <=20 SENSITIVE Sensitive     * >=100,000 COLONIES/mL SERRATIA MARCESCENS    [x]  Treated with cephalexin, organism resistant to prescribed antimicrobial  New antibiotic prescription: Stop cephalexin. Start levofloxacin 500 mg QD x 5 days and follow up with urology.  ED Provider: Kennith Maes, PA-C   Ricka Burdock 12/21/2017, 10:37 AM Infectious Diseases Pharmacist Phone# [phone]

## 2017-12-23 ENCOUNTER — Ambulatory Visit (INDEPENDENT_AMBULATORY_CARE_PROVIDER_SITE_OTHER): Payer: Medicare Other | Admitting: Family Medicine

## 2017-12-23 ENCOUNTER — Ambulatory Visit: Payer: Self-pay | Admitting: *Deleted

## 2017-12-23 ENCOUNTER — Encounter: Payer: Self-pay | Admitting: Family Medicine

## 2017-12-23 VITALS — BP 122/82 | HR 88 | Temp 98.4°F | Wt 115.0 lb

## 2017-12-23 DIAGNOSIS — B029 Zoster without complications: Secondary | ICD-10-CM | POA: Diagnosis not present

## 2017-12-23 MED ORDER — ACYCLOVIR 200 MG PO CAPS: 200.0000 mg | ORAL_CAPSULE | Freq: Every day | ORAL | 1 refills | Status: DC

## 2017-12-23 MED ORDER — HYDROCODONE-ACETAMINOPHEN 5-325 MG PO TABS: 1.0000 | ORAL_TABLET | ORAL | 0 refills | Status: DC | PRN

## 2017-12-23 NOTE — Progress Notes (Signed)
Nereyda is a 84 year old female who comes in today company by her daughter for evaluation of a skin rash  She states on January 1 she went to the emergency room for evaluation of severe right-sided flank pain. She had numerous blood tests CT scans etc. etiology unknown. She was told she had a urinary tract infection was given an antibiotic. The next day she broke out in a rash. The rash 6 extremely painful. She thinks shingles. She's had shingles in the past.  Review of systems otherwise negative BP 122/82 (BP Location: Left Arm, Patient Position: Sitting, Cuff Size: Normal)   Pulse 88   Temp 98.4 F (36.9 C) (Oral)   Wt 115 lb (52.2 kg)   BMI 20.37 kg/m  Well-developed well-nourished thin female no acute distress examination the abdomen shows a typical shingles type rash from the right flank to the midline of the abdomen.  #1 shingles............Marland Kitchen Acyclovir............. pain medication as outlined.

## 2017-12-23 NOTE — Patient Instructions (Addendum)
Acyclovir.......... 3 in the morning 2 at bedtime............. until rash completely gone  Pain medication...........Marland Kitchen 1/2-1 tablet every 4-6 hours for severe pain  Return when necessary

## 2017-12-23 NOTE — Telephone Encounter (Signed)
  Reason for Disposition . [1] Shingles rash (matches SYMPTOMS) AND [2] onset within past 72 hours  Answer Assessment - Initial Assessment Questions 1. APPEARANCE of RASH: "Describe the rash."      Red, raised. Blisters on her back 2. LOCATION: "Where is the rash located?"      Starts on lower right back around to her belly, stops right above the belly button 3. ONSET: "When did the rash start?"      January 2 nd 4. ITCHING: "Does the rash itch?" If so, ask: "How bad is the itch?"  (Scale 1-10; or mild, moderate, severe)     Itches and burns, severe 5. PAIN: "Does the rash hurt?" If so, ask: "How bad is the pain?"  (Scale 1-10; or mild, moderate, severe)     Yes, #10 pain  6. OTHER SYMPTOMS: "Do you have any other symptoms?" (e.g., fever)     no 7. PREGNANCY: "Is there any chance you are pregnant?" "When was your last menstrual period?"     n/a  Protocols used: SHINGLES-A-AH  Pt has rash on her lower back going around to her right  side to her belly. The rash is red, raised and itching. Has some blisters to her back. Appointment made for today. Home care advice given to patient and daughter.

## 2018-01-02 ENCOUNTER — Other Ambulatory Visit: Payer: Self-pay | Admitting: Urology

## 2018-01-07 ENCOUNTER — Ambulatory Visit (INDEPENDENT_AMBULATORY_CARE_PROVIDER_SITE_OTHER): Payer: Medicare Other | Admitting: Internal Medicine

## 2018-01-07 ENCOUNTER — Encounter: Payer: Self-pay | Admitting: Internal Medicine

## 2018-01-07 VITALS — BP 122/78 | HR 94 | Temp 98.2°F | Ht 63.0 in | Wt 111.6 lb

## 2018-01-07 DIAGNOSIS — C20 Malignant neoplasm of rectum: Secondary | ICD-10-CM | POA: Diagnosis not present

## 2018-01-07 DIAGNOSIS — E785 Hyperlipidemia, unspecified: Secondary | ICD-10-CM | POA: Diagnosis not present

## 2018-01-07 DIAGNOSIS — E039 Hypothyroidism, unspecified: Secondary | ICD-10-CM | POA: Diagnosis not present

## 2018-01-07 DIAGNOSIS — I10 Essential (primary) hypertension: Secondary | ICD-10-CM

## 2018-01-07 DIAGNOSIS — B029 Zoster without complications: Secondary | ICD-10-CM

## 2018-01-07 MED ORDER — TRAMADOL-ACETAMINOPHEN 37.5-325 MG PO TABS: 1.0000 | ORAL_TABLET | Freq: Four times a day (QID) | ORAL | 5 refills | Status: DC | PRN

## 2018-01-07 MED ORDER — HYDROCODONE-ACETAMINOPHEN 5-325 MG PO TABS: 1.0000 | ORAL_TABLET | ORAL | 0 refills | Status: DC | PRN

## 2018-01-07 NOTE — Patient Instructions (Addendum)

## 2018-01-07 NOTE — Progress Notes (Signed)
Subjective:    Patient ID: Sherry [name], female    DOB: September 23, 1923, 84 y.o.   MRN: [id number]  HPI 84 year old patient who is seen today for follow-up of shingles.  She had the onset of a rash involving the right flank area on January 2.  She presented to the ED the day prior with flank pain but with no rash. She completed antiviral therapy but remains still quite uncomfortable.  She has been assisted with hydrocodone for pain control. She is followed closely by urology with a malignant neoplasm of the bladder and is scheduled for TUR next month  Past Medical History:  Diagnosis Date  . Anemia   . Arthritis   . Bronchitis   . CAD (coronary artery disease)   . COPD (chronic obstructive pulmonary disease) ([place])   . Emphysema of lung ([place])   . GERD (gastroesophageal reflux disease)   . Headache(784.0)   . History of blood transfusion   . History of kidney stones   . Hyperlipidemia   . Hypertension   . Hypothyroidism   . Osteoporosis   . Rectal adenocarcinoma ([place]) dx'd 08/2009  . Rectal cancer ([place])   . Scoliosis    per Dr Melony Overly ortho  . Shingles   . Shortness of breath   . Tendon adhesions    torn tendon right shoulder     Social History   Socioeconomic History  . Marital status: Married    Spouse name: Not on file  . Number of children: 3  . Years of education: Not on file  . Highest education level: Not on file  Social Needs  . Financial resource strain: Not on file  . Food insecurity - worry: Not on file  . Food insecurity - inability: Not on file  . Transportation needs - medical: Not on file  . Transportation needs - non-medical: Not on file  Occupational History  . Not on file  Tobacco Use  . Smoking status: Former Smoker    Packs/day: 1.50    Years: 65.00    Pack years: 97.50    Types: Cigarettes    Last attempt to quit: 12/18/2003    Years since quitting: 14.0  . Smokeless tobacco: Never Used  . Tobacco comment: quit 2005  Substance and  Sexual Activity  . Alcohol use: No    Alcohol/week: 0.0 oz  . Drug use: No  . Sexual activity: No    Birth control/protection: None  Other Topics Concern  . Not on file  Social History Narrative  . Not on file    Past Surgical History:  Procedure Laterality Date  . APPENDECTOMY    . BALLOON DILATION Left 04/10/2017   Procedure: BALLOON DILATION OF LEFT URETERAL STRICTURE;  Surgeon: Cleon Gustin, MD;  [facility];  Service: Urology;  Laterality: Left;  . CARDIAC CATHETERIZATION    . CHOLECYSTECTOMY    . COLON REMOVAL     8 " 12/2009 DR TSUEI  . COLON SURGERY     apr for rectal cancer 2011  . COLONOSCOPY    . COLOSTOMY    . CYSTOSCOPY W/ URETERAL STENT PLACEMENT Left 03/18/2017   Procedure: CYSTOSCOPY WITH RETROGRADE PYELOGRAM/URETERAL STENT PLACEMENT AND URETERAL DIAGNOSTIC LEFT;  Surgeon: Cleon Gustin, MD;  [facility];  Service: Urology;  Laterality: Left;  1 HR [phone] MEDICARE-[id number] A [phone] - pt to arrive at 7:00 to receive blood  . CYSTOSCOPY W/ URETERAL STENT PLACEMENT Left 04/10/2017   Procedure: CYSTOSCOPY WITH  STENT REPLACEMENT;  Surgeon: Cleon Gustin, MD;  [facility];  Service: Urology;  Laterality: Left;  . CYSTOSCOPY W/ URETERAL STENT PLACEMENT Left 06/03/2017   Procedure: CYSTOSCOPY WITH LEFT RETROGRADE PYELOGRAM/LEFT URETERAL  BALLOON DILATION AND LEFT URETERAL STENT PLACEMENT;  Surgeon: Cleon Gustin, MD;  [facility];  Service: Urology;  Laterality: Left;  . CYSTOSCOPY W/ URETERAL STENT PLACEMENT Left 09/16/2017   Procedure: CYSTOSCOPY WITH LEFT STENT EXCHANGE/ RETROGRADE;  Surgeon: Cleon Gustin, MD;  [facility];  Service: Urology;  Laterality: Left;  NEEDS TOTAL 30 MIN  . CYSTOSCOPY WITH RETROGRADE PYELOGRAM, URETEROSCOPY AND STENT PLACEMENT Left 03/29/2016   Procedure: CYSTOSCOPY WITH RETROGRADE PYELOGRAM, URETEROSCOPY AND STENT PLACEMENT;  Surgeon: Kathie Rhodes, MD;  [facility];   Service: Urology;  Laterality: Left;  With Laser  . CYSTOSCOPY/RETROGRADE/URETEROSCOPY Left 01/28/2017   Procedure: CYSTOSCOPY/RETROGRADE/URETEROSCOPY (FLEX 6 fr) THULIUM LASER OF TUMOR;  Surgeon: Franchot Gallo, MD;  [facility];  Service: Urology;  Laterality: Left;  . CYSTOSCOPY/RETROGRADE/URETEROSCOPY Left 04/10/2017   Procedure: CYSTOSCOPY, LEFT RETROGRADE PYELOGRAM LEFT URETEROSCOPY;  Surgeon: Cleon Gustin, MD;  [facility];  Service: Urology;  Laterality: Left;  . EYE SURGERY     Cataract with lens- bil  . HOLMIUM LASER APPLICATION Left 7/89/3810   Procedure: HOLMIUM LASER ABLATION LEFT URETERAL TUMOR;  Surgeon: Cleon Gustin, MD;  [facility];  Service: Urology;  Laterality: Left;  . OOPHORECTOMY    . ovary tumor removal     after son was born  . PARASTOMAL HERNIA REPAIR  10/30/2012  . POLYPECTOMY    . RECTUM REMOVAL     1/11 DR TSUEI  . TRANSURETHRAL RESECTION OF BLADDER TUMOR N/A 10/21/2017   Procedure: TRANSURETHRAL RESECTION OF BLADDER TUMOR (TURBT);  Surgeon: Cleon Gustin, MD;  [facility];  Service: Urology;  Laterality: N/A;  . TUBAL LIGATION    . VAGINAL HYSTERECTOMY    . VENTRAL HERNIA REPAIR  10/30/2012   Procedure: LAPAROSCOPIC VENTRAL HERNIA;  Surgeon: Imogene Burn. Georgette Dover, MD;  Location: [facility];  Service: General;  Laterality: N/A;  Laparoscopic repair of parastomal hernia    Family History  Problem Relation Age of Onset  . Heart failure Sister   . Cervical cancer Sister   . Heart failure Brother   . Colon cancer Brother   . Colon cancer Brother   . Heart failure Father   . Cancer Mother        HEAD AND NECK  . Cervical cancer Daughter   . Kidney cancer Daughter     Allergies  Allergen Reactions  . Percocet [Oxycodone-Acetaminophen] Other (See Comments)    Upsets stomach- does tolerate   . Influenza Virus Vacc Split Pf Other (See Comments)    Unknown  But had to be hospitalized -1966  . Sulfamethoxazole-Trimethoprim  Nausea And Vomiting  . Hibiclens [Chlorhexidine Gluconate] Rash    Current Outpatient Medications on File Prior to Visit  Medication Sig Dispense Refill  . acetaminophen (TYLENOL) 325 MG tablet Take 650 mg by mouth every 6 (six) hours as needed for mild pain or headache.     Marland Kitchen acyclovir (ZOVIRAX) 200 MG capsule Take 1 capsule (200 mg total) by mouth 5 (five) times daily. 50 capsule 1  . atorvastatin (LIPITOR) 10 MG tablet TAKE 1 TABLET DAILY 90 tablet 2  . diltiazem (TIAZAC) 180 MG 24 hr capsule TAKE 1 CAPSULE DAILY 90 capsule 3  . levothyroxine (SYNTHROID, LEVOTHROID) 75 MCG tablet TAKE  1 TABLET DAILY 90 tablet 2  . meclizine (ANTIVERT) 25 MG tablet Take 1 tablet (25 mg total) by mouth 3 (three) times daily as needed for dizziness. 30 tablet 1  . PROAIR HFA 108 (90 Base) MCG/ACT inhaler USE 2 INHALATIONS INTO THE LUNGS EVERY 4 HOURS AS NEEDED FOR SHORTNESS OF BREATH 25.5 g 5  . TOPROL XL 25 MG 24 hr tablet TAKE ONE-HALF (1/2) TABLET DAILY 90 tablet 1   No current facility-administered medications on file prior to visit.     BP 122/78 (BP Location: Left Arm, Patient Position: Sitting, Cuff Size: Normal)   Pulse 94   Temp 98.2 F (36.8 C) (Oral)   Ht 5\' 3"  (1.6 m)   Wt 111 lb 9.6 oz (50.6 kg)   SpO2 97%   BMI 19.77 kg/m      Review of Systems  Constitutional: Negative.   HENT: Negative for congestion, dental problem, hearing loss, rhinorrhea, sinus pressure, sore throat and tinnitus.   Eyes: Negative for pain, discharge and visual disturbance.  Respiratory: Negative for cough and shortness of breath.   Cardiovascular: Negative for chest pain, palpitations and leg swelling.  Gastrointestinal: Negative for abdominal distention, abdominal pain, blood in stool, constipation, diarrhea, nausea and vomiting.  Genitourinary: Positive for flank pain. Negative for difficulty urinating, dysuria, frequency, hematuria, pelvic pain, urgency, vaginal bleeding, vaginal discharge and vaginal  pain.  Musculoskeletal: Positive for back pain. Negative for arthralgias, gait problem and joint swelling.  Skin: Positive for rash.  Neurological: Negative for dizziness, syncope, speech difficulty, weakness, numbness and headaches.  Hematological: Negative for adenopathy.  Psychiatric/Behavioral: Negative for agitation, behavioral problems and dysphoric mood. The patient is not nervous/anxious.        Objective:   Physical Exam  Constitutional: She appears well-developed and well-nourished. No distress.  Cardiovascular: Normal rate and regular rhythm.  Pulmonary/Chest: Effort normal.  Skin:  Resolving herpetic rash involving the right flank area extending from the midline anteriorly to the posterior back area          Assessment & Plan:   Resolving shingles Patient has completed antiviral therapy.  We will continue analgesics as needed Essential hypertension stable History of malignant neoplasm of the bladder.  Patient is scheduled for TUR next month  Follow-up here 6 months or as needed  Nyoka Cowden

## 2018-01-14 ENCOUNTER — Telehealth: Payer: Self-pay | Admitting: Internal Medicine

## 2018-01-14 NOTE — Telephone Encounter (Signed)
Copied from [place] [id number]. Topic: Quick Communication - See Telephone Encounter >> Jan 14, 2018 10:45 AM Bea Graff, NT wrote: CRM for notification. See Telephone encounter for: Pts daughter Marvell Fuller calling and states her mom stated to her that the pain medication traMADol-acetaminophen (ULTRACET) is not helping her pain with her shingles. Just making her sleepy and losing her appetite. Would like to see if something else can be recommended like maybe steroids? Please contact daughter.   01/14/18.

## 2018-01-14 NOTE — Telephone Encounter (Signed)
Pts daughter states Ultracet prescribed to pt for shingles is ineffective for her pain, making her sleepy, no appetite. Requesting alternative medication.  [phone]

## 2018-01-15 ENCOUNTER — Other Ambulatory Visit: Payer: Self-pay | Admitting: Family Medicine

## 2018-01-15 MED ORDER — LIDOCAINE 5 % EX PTCH: MEDICATED_PATCH | CUTANEOUS | 2 refills | Status: DC

## 2018-01-15 NOTE — Telephone Encounter (Signed)
Please see below message, please advise.

## 2018-01-15 NOTE — Telephone Encounter (Signed)
lidoderm patch.

## 2018-01-15 NOTE — Telephone Encounter (Signed)
Called pt's daughter at [phone], a detailed message was left on voicemail stating that Lidoderm patches were called in to pt's pharmacy.

## 2018-01-15 NOTE — Telephone Encounter (Signed)
Pt daughter is calling back on the status of this. Please advise.

## 2018-01-20 ENCOUNTER — Ambulatory Visit: Payer: Self-pay | Admitting: *Deleted

## 2018-01-20 NOTE — Telephone Encounter (Signed)
Pt called stating that she has had the shingles, and she continues to have pain in her right rib but the pain continues; she states that she has taken aleve and pain pill and has used lidoderm patcheses but the pain has not gotten better; she is also concerned because she has a stent in her bladder and is concerned because she is to have a procedure on 01/29/18;  per nurse triage recommendation pt to see physician within 3 days; pt request appointment with Dr Inda Merlin 01/21/18 but none are available; pt offered and accepted appointment with Dr Elease Hashimoto at 1400 01/21/18; pt verbalizes understanding.      Reason for Disposition . Rash in same area as pain (may be described as "small blisters")  Answer Assessment - Initial Assessment Questions 1. LOCATION: "Where does it hurt?" (e.g., left, right)     Right side 2. ONSET: "When did the pain start?"    12/23/17 3. SEVERITY: "How bad is the pain?" (e.g., Scale 1-10; mild, moderate, or severe)   - MILD (1-3): doesn't interfere with normal activities    - MODERATE (4-7): interferes with normal activities or awakens from sleep    - SEVERE (8-10): excruciating pain and patient unable to do normal activities (stays in bed)       Rated 9 out of 10  4. PATTERN: "Does the pain come and go, or is it constant?"      Strengthens and decreases 5. CAUSE: "What do you think is causing the pain?"     Area where shingles were 6. OTHER SYMPTOMS:  "Do you have any other symptoms?" (e.g., fever, abdominal pain, vomiting, leg weakness, burning with urination, blood in urine)     Sore, burning 7. PREGNANCY:  "Is there any chance you are pregnant?" "When was your last menstrual period?"     no  Protocols used: FLANK PAIN-A-AH

## 2018-01-21 ENCOUNTER — Ambulatory Visit (INDEPENDENT_AMBULATORY_CARE_PROVIDER_SITE_OTHER): Payer: Medicare Other | Admitting: Family Medicine

## 2018-01-21 ENCOUNTER — Encounter: Payer: Self-pay | Admitting: Family Medicine

## 2018-01-21 VITALS — BP 120/60 | HR 66 | Temp 98.2°F | Ht 63.0 in | Wt 110.0 lb

## 2018-01-21 DIAGNOSIS — B0229 Other postherpetic nervous system involvement: Secondary | ICD-10-CM | POA: Diagnosis not present

## 2018-01-21 MED ORDER — GABAPENTIN 100 MG PO CAPS: 100.0000 mg | ORAL_CAPSULE | Freq: Three times a day (TID) | ORAL | 3 refills | Status: DC

## 2018-01-21 NOTE — Patient Instructions (Signed)
Postherpetic Neuralgia Postherpetic neuralgia (PHN) is nerve pain that occurs after a shingles infection. Shingles is a painful rash that appears on one side of the body, usually on your trunk or face. Shingles is caused by the varicella-zoster virus. This is the same virus that causes chickenpox. In people who have had chickenpox, the virus can resurface years later and cause shingles. You may have PHN if you continue to have pain for 3 months after your shingles rash has gone away. PHN appears in the same area where you had the shingles rash. For most people, PHN goes away within 1 year. Getting a vaccination for shingles can prevent PHN. This vaccine is recommended for people older than 50. It may prevent shingles and may also lower your risk of PHN if you do get shingles. What are the causes? PHN is caused by damage to your nerves from the varicella-zoster virus. This damage makes your nerves overly sensitive. What increases the risk? Aging is the biggest risk factor for developing PHN. Most people who get PHN are older than 55. Other risk factors include:  Having very bad pain before your shingles rash starts.  Having a very bad rash.  Having shingles in the nerve that supplies your face and eye (trigeminal nerve).  What are the signs or symptoms? Pain is the main symptom of PHN. The pain is often very bad and may be described as stabbing, burning, or feeling like an electric shock. The pain may come and go or may be there all the time. Pain may be triggered by light touches on the skin or changes in temperature. You may have itching along with the pain. How is this diagnosed? Your health care provider may diagnose PHN based on your symptoms and your history of shingles. Lab studies and other diagnostic tests are usually not needed. How is this treated? There is no cure for PHN. Treatment for PHN will focus on pain relief. Over-the-counter pain relievers do not usually relieve PHN pain. You  may need to work with a pain specialist. Treatment may include:  Antidepressant medicines to help with pain and improve sleep.  Antiseizure medicines to relieve nerve pain.  Strong pain relievers (opioids).  A numbing patch worn on the skin (lidocaine patch).  Follow these instructions at home: It may take a long time to recover from PHN. Work closely with your health care provider, and have a good support system at home.  Take all medicines as directed by your health care provider.  Wear loose, comfortable clothing.  Cover sensitive areas with a dressing to reduce friction from clothing rubbing on the area.  If cold does not make your pain worse, try applying a cool compress or cooling gel pack to the area.  Talk to your health care provider if you feel depressed or desperate. Living with long-term pain can be depressing.  Contact a health care provider if:  Your medicine is not helping.  You are struggling to manage your pain at home. This information is not intended to replace advice given to you by your health care provider. Make sure you discuss any questions you have with your health care provider. Document Released: 02/23/2003 Document Revised: 05/10/2016 Document Reviewed: 11/24/2013 Elsevier Interactive Patient Education  [employer].  If pain not improved in 2-3 days may increase to 200 mg every 8 hours.

## 2018-01-21 NOTE — Progress Notes (Signed)
Subjective:     Patient ID: Sherry [name], female   DOB: February 24, 1923, 84 y.o.   MRN: [id number]  HPI Pt here with "shingles pain".  Had shingles right lower thoracic area to abdomen early January.  Rash has healed but severe pain at times.  9 to 10/10 intensity.  She has tried many things without improvement- Tylenol, Ibuprofen, Aleve, hydrocodone, Tramadol, Lidocaine patch. Interfering with sleep and decreased appetite.   Past Medical History:  Diagnosis Date  . Anemia   . Arthritis   . Bronchitis   . CAD (coronary artery disease)   . COPD (chronic obstructive pulmonary disease) ([place])   . Emphysema of lung ([place])   . GERD (gastroesophageal reflux disease)   . Headache(784.0)   . History of blood transfusion   . History of kidney stones   . Hyperlipidemia   . Hypertension   . Hypothyroidism   . Osteoporosis   . Rectal adenocarcinoma ([place]) dx'd 08/2009  . Rectal cancer ([place])   . Scoliosis    per Dr Melony Overly ortho  . Shingles   . Shortness of breath   . Tendon adhesions    torn tendon right shoulder   Past Surgical History:  Procedure Laterality Date  . APPENDECTOMY    . BALLOON DILATION Left 04/10/2017   Procedure: BALLOON DILATION OF LEFT URETERAL STRICTURE;  Surgeon: Cleon Gustin, MD;  [facility];  Service: Urology;  Laterality: Left;  . CARDIAC CATHETERIZATION    . CHOLECYSTECTOMY    . COLON REMOVAL     8 " 12/2009 DR TSUEI  . COLON SURGERY     apr for rectal cancer 2011  . COLONOSCOPY    . COLOSTOMY    . CYSTOSCOPY W/ URETERAL STENT PLACEMENT Left 03/18/2017   Procedure: CYSTOSCOPY WITH RETROGRADE PYELOGRAM/URETERAL STENT PLACEMENT AND URETERAL DIAGNOSTIC LEFT;  Surgeon: Cleon Gustin, MD;  [facility];  Service: Urology;  Laterality: Left;  1 HR [phone] MEDICARE-[id number] A [phone] - pt to arrive at 7:00 to receive blood  . CYSTOSCOPY W/ URETERAL STENT PLACEMENT Left 04/10/2017   Procedure: CYSTOSCOPY WITH STENT REPLACEMENT;   Surgeon: Cleon Gustin, MD;  [facility];  Service: Urology;  Laterality: Left;  . CYSTOSCOPY W/ URETERAL STENT PLACEMENT Left 06/03/2017   Procedure: CYSTOSCOPY WITH LEFT RETROGRADE PYELOGRAM/LEFT URETERAL  BALLOON DILATION AND LEFT URETERAL STENT PLACEMENT;  Surgeon: Cleon Gustin, MD;  [facility];  Service: Urology;  Laterality: Left;  . CYSTOSCOPY W/ URETERAL STENT PLACEMENT Left 09/16/2017   Procedure: CYSTOSCOPY WITH LEFT STENT EXCHANGE/ RETROGRADE;  Surgeon: Cleon Gustin, MD;  [facility];  Service: Urology;  Laterality: Left;  NEEDS TOTAL 30 MIN  . CYSTOSCOPY WITH RETROGRADE PYELOGRAM, URETEROSCOPY AND STENT PLACEMENT Left 03/29/2016   Procedure: CYSTOSCOPY WITH RETROGRADE PYELOGRAM, URETEROSCOPY AND STENT PLACEMENT;  Surgeon: Kathie Rhodes, MD;  [facility];  Service: Urology;  Laterality: Left;  With Laser  . CYSTOSCOPY/RETROGRADE/URETEROSCOPY Left 01/28/2017   Procedure: CYSTOSCOPY/RETROGRADE/URETEROSCOPY (FLEX 6 fr) THULIUM LASER OF TUMOR;  Surgeon: Franchot Gallo, MD;  [facility];  Service: Urology;  Laterality: Left;  . CYSTOSCOPY/RETROGRADE/URETEROSCOPY Left 04/10/2017   Procedure: CYSTOSCOPY, LEFT RETROGRADE PYELOGRAM LEFT URETEROSCOPY;  Surgeon: Cleon Gustin, MD;  [facility];  Service: Urology;  Laterality: Left;  . EYE SURGERY     Cataract with lens- bil  . HOLMIUM LASER APPLICATION Left 8/84/1660   Procedure: HOLMIUM LASER ABLATION LEFT URETERAL TUMOR;  Surgeon: Cleon Gustin, MD;  [facility];  Service: Urology;  Laterality: Left;  . OOPHORECTOMY    . ovary tumor removal     after son was born  . PARASTOMAL HERNIA REPAIR  10/30/2012  . POLYPECTOMY    . RECTUM REMOVAL     1/11 DR TSUEI  . TRANSURETHRAL RESECTION OF BLADDER TUMOR N/A 10/21/2017   Procedure: TRANSURETHRAL RESECTION OF BLADDER TUMOR (TURBT);  Surgeon: Cleon Gustin, MD;  [facility];  Service: Urology;  Laterality: N/A;  . TUBAL LIGATION     . VAGINAL HYSTERECTOMY    . VENTRAL HERNIA REPAIR  10/30/2012   Procedure: LAPAROSCOPIC VENTRAL HERNIA;  Surgeon: Imogene Burn. Georgette Dover, MD;  Location: [facility];  Service: General;  Laterality: N/A;  Laparoscopic repair of parastomal hernia    reports that she quit smoking about 14 years ago. Her smoking use included cigarettes. She has a 97.50 pack-year smoking history. she has never used smokeless tobacco. She reports that she does not drink alcohol or use drugs. family history includes Cancer in her mother; Cervical cancer in her daughter and sister; Colon cancer in her brother and brother; Heart failure in her brother, father, and sister; Kidney cancer in her daughter. Allergies  Allergen Reactions  . Percocet [Oxycodone-Acetaminophen] Other (See Comments)    Upsets stomach- does tolerate   . Influenza Virus Vacc Split Pf Other (See Comments)    Unknown  But had to be hospitalized -1966  . Sulfamethoxazole-Trimethoprim Nausea And Vomiting  . Hibiclens [Chlorhexidine Gluconate] Rash     Review of Systems  Constitutional: Negative for chills and fever.  Respiratory: Negative for shortness of breath.   Cardiovascular: Negative for chest pain.  Neurological: Negative for dizziness.  Psychiatric/Behavioral: Negative for confusion.       Objective:   Physical Exam  Constitutional: She is oriented to person, place, and time. She appears well-developed and well-nourished.  Cardiovascular: Normal rate.  Pulmonary/Chest: Effort normal and breath sounds normal. No respiratory distress. She has no wheezes. She has no rales.  Neurological: She is alert and oriented to person, place, and time.  Skin:  Mild scarring right lateral thoracic area from recent shingles.  No active/acute rash.       Assessment:     Postherpetic neuralgia with severe pain not relieved with multiple medications above.    Plan:     -Trial of Gabapentin 100 mg po TID.  May increase to 200 mg tid after a few days  if no relief. - -reviewed possible side effects from medication -follow up with primary in 2 weeks.  Eulas Post MD [facility] at [facility]

## 2018-01-23 ENCOUNTER — Encounter (HOSPITAL_COMMUNITY): Admission: RE | Admit: 2018-01-23 | Payer: Medicare Other | Source: Ambulatory Visit

## 2018-01-26 ENCOUNTER — Other Ambulatory Visit: Payer: Self-pay | Admitting: Internal Medicine

## 2018-01-27 ENCOUNTER — Other Ambulatory Visit: Payer: Self-pay

## 2018-01-27 ENCOUNTER — Other Ambulatory Visit: Payer: Self-pay | Admitting: Urology

## 2018-01-27 ENCOUNTER — Telehealth: Payer: Self-pay | Admitting: Internal Medicine

## 2018-01-27 MED ORDER — LEVOTHYROXINE SODIUM 75 MCG PO TABS: 75.0000 ug | ORAL_TABLET | Freq: Every day | ORAL | 0 refills | Status: DC

## 2018-01-27 NOTE — Telephone Encounter (Signed)
Copied from [place]. Topic: Quick Communication - See Telephone Encounter >> Jan 27, 2018  9:25 AM Boyd Kerbs wrote: CRM for notification. See Telephone encounter for:   Pt. Does not have enough medication until 2/19 appt.  Asking to have enough to last.  Has enough for the next 3 days.   [place], [place] - [address] [place] [id number] [place] [address] Phone: [phone] Fax: [phone]   01/27/18.

## 2018-02-04 ENCOUNTER — Encounter: Payer: Self-pay | Admitting: Internal Medicine

## 2018-02-04 ENCOUNTER — Ambulatory Visit (INDEPENDENT_AMBULATORY_CARE_PROVIDER_SITE_OTHER): Payer: Medicare Other | Admitting: Internal Medicine

## 2018-02-04 VITALS — BP 132/86 | HR 73 | Temp 98.9°F | Ht 63.0 in | Wt 112.2 lb

## 2018-02-04 DIAGNOSIS — B0229 Other postherpetic nervous system involvement: Secondary | ICD-10-CM

## 2018-02-04 MED ORDER — GABAPENTIN 100 MG PO CAPS: 100.0000 mg | ORAL_CAPSULE | Freq: Three times a day (TID) | ORAL | 3 refills | Status: DC

## 2018-02-04 NOTE — Patient Instructions (Addendum)
Postherpetic Neuralgia Postherpetic neuralgia (PHN) is nerve pain that occurs after a shingles infection. Shingles is a painful rash that appears on one side of the body, usually on your trunk or face. Shingles is caused by the varicella-zoster virus. This is the same virus that causes chickenpox. In people who have had chickenpox, the virus can resurface years later and cause shingles. You may have PHN if you continue to have pain for 3 months after your shingles rash has gone away. PHN appears in the same area where you had the shingles rash. For most people, PHN goes away within 1 year. Getting a vaccination for shingles can prevent PHN. This vaccine is recommended for people older than 50. It may prevent shingles and may also lower your risk of PHN if you do get shingles. What are the causes? PHN is caused by damage to your nerves from the varicella-zoster virus. This damage makes your nerves overly sensitive. What increases the risk? Aging is the biggest risk factor for developing PHN. Most people who get PHN are older than 60. Other risk factors include:  Having very bad pain before your shingles rash starts.  Having a very bad rash.  Having shingles in the nerve that supplies your face and eye (trigeminal nerve).  What are the signs or symptoms? Pain is the main symptom of PHN. The pain is often very bad and may be described as stabbing, burning, or feeling like an electric shock. The pain may come and go or may be there all the time. Pain may be triggered by light touches on the skin or changes in temperature. You may have itching along with the pain. How is this diagnosed? Your health care provider may diagnose PHN based on your symptoms and your history of shingles. Lab studies and other diagnostic tests are usually not needed. How is this treated? There is no cure for PHN. Treatment for PHN will focus on pain relief. Over-the-counter pain relievers do not usually relieve PHN pain. You  may need to work with a pain specialist. Treatment may include:  Antidepressant medicines to help with pain and improve sleep.  Antiseizure medicines to relieve nerve pain.  Strong pain relievers (opioids).  A numbing patch worn on the skin (lidocaine patch).  Follow these instructions at home: It may take a long time to recover from PHN. Work closely with your health care provider, and have a good support system at home.  Take all medicines as directed by your health care provider.  Wear loose, comfortable clothing.  Cover sensitive areas with a dressing to reduce friction from clothing rubbing on the area.  If cold does not make your pain worse, try applying a cool compress or cooling gel pack to the area.  Talk to your health care provider if you feel depressed or desperate. Living with long-term pain can be depressing.  Contact a health care provider if:  Your medicine is not helping.  You are struggling to manage your pain at home. This information is not intended to replace advice given to you by your health care provider. Make sure you discuss any questions you have with your health care provider. Document Released: 02/23/2003 Document Revised: 05/10/2016 Document Reviewed: 11/24/2013 Elsevier Interactive Patient Education  2018 Elsevier Inc.  

## 2018-02-04 NOTE — Progress Notes (Signed)
Subjective:    Patient ID: Sherry [name], female    DOB: 1923/04/17, 84 y.o.   MRN: [id number]  HPI  84 year old patient who is seen today for follow-up of shingles comp gated by PHN.  She was seen 2 weeks ago with worsening right flank pain and was placed on gabapentin 100 mg 3 times daily.  She has had a very nice clinical response and she states that her pain is 70% improved.  Otherwise she is taking Aleve.  She no longer is using topical lidocaine or other analgesics.  Past Medical History:  Diagnosis Date  . Anemia   . Arthritis   . Bronchitis   . CAD (coronary artery disease)   . COPD (chronic obstructive pulmonary disease) ([place])   . Emphysema of lung ([place])   . GERD (gastroesophageal reflux disease)   . Headache(784.0)   . History of blood transfusion   . History of kidney stones   . Hyperlipidemia   . Hypertension   . Hypothyroidism   . Osteoporosis   . Rectal adenocarcinoma ([place]) dx'd 08/2009  . Rectal cancer ([place])   . Scoliosis    per Dr Melony Overly ortho  . Shingles   . Shortness of breath   . Tendon adhesions    torn tendon right shoulder     Social History   Socioeconomic History  . Marital status: Married    Spouse name: Not on file  . Number of children: 3  . Years of education: Not on file  . Highest education level: Not on file  Social Needs  . Financial resource strain: Not on file  . Food insecurity - worry: Not on file  . Food insecurity - inability: Not on file  . Transportation needs - medical: Not on file  . Transportation needs - non-medical: Not on file  Occupational History  . Not on file  Tobacco Use  . Smoking status: Former Smoker    Packs/day: 1.50    Years: 65.00    Pack years: 97.50    Types: Cigarettes    Last attempt to quit: 12/18/2003    Years since quitting: 14.1  . Smokeless tobacco: Never Used  . Tobacco comment: quit 2005  Substance and Sexual Activity  . Alcohol use: No    Alcohol/week: 0.0 oz  . Drug use: No  .  Sexual activity: No    Birth control/protection: None  Other Topics Concern  . Not on file  Social History Narrative  . Not on file    Past Surgical History:  Procedure Laterality Date  . APPENDECTOMY    . BALLOON DILATION Left 04/10/2017   Procedure: BALLOON DILATION OF LEFT URETERAL STRICTURE;  Surgeon: Cleon Gustin, MD;  Location: [facility];  Service: Urology;  Laterality: Left;  . CARDIAC CATHETERIZATION    . CHOLECYSTECTOMY    . COLON REMOVAL     8 " 12/2009 DR TSUEI  . COLON SURGERY     ap[facility] for rectal cancer 2011  . COLONOSCOPY    . COLOSTOMY    . CYSTOSCOPY W/ URETERAL STENT PLACEMENT Left 03/18/2017   Procedure: CYSTOSCOPY WITH RETROGRADE PYELOGRAM/URETERAL STENT PLACEMENT AND URETERAL DIAGNOSTIC LEFT;  Surgeon: Cleon Gustin, MD;  Location: [facility];  Service: Urology;  Laterality: Left;  1 HR [phone] MEDICARE-[id number] [phone] - pt to arrive at 7:00 to receive blood  . CYSTOSCOPY W/ URETERAL STENT PLACEMENT Left 04/10/2017   Procedure: CYSTOSCOPY WITH STENT REPLACEMENT;  Surgeon: Cleon Gustin, MD;  Location: [facility];  Service: Urology;  Laterality: Left;  . CYSTOSCOPY W/ URETERAL STENT PLACEMENT Left 06/03/2017   Procedure: CYSTOSCOPY WITH LEFT RETROGRADE PYELOGRAM/LEFT URETERAL  BALLOON DILATION AND LEFT URETERAL STENT PLACEMENT;  Surgeon: Cleon Gustin, MD;  Location: [facility];  Service: Urology;  Laterality: Left;  . CYSTOSCOPY W/ URETERAL STENT PLACEMENT Left 09/16/2017   Procedure: CYSTOSCOPY WITH LEFT STENT EXCHANGE/ RETROGRADE;  Surgeon: Cleon Gustin, MD;  Location: [facility];  Service: Urology;  Laterality: Left;  NEEDS TOTAL 30 MIN  . CYSTOSCOPY WITH RETROGRADE PYELOGRAM, URETEROSCOPY AND STENT PLACEMENT Left 03/29/2016   Procedure: CYSTOSCOPY WITH RETROGRADE PYELOGRAM, URETEROSCOPY AND STENT PLACEMENT;  Surgeon: Kathie Rhodes, MD;  Location: [facility];  Service: Urology;  Laterality: Left;  With Laser  . CYSTOSCOPY/RETROGRADE/URETEROSCOPY  Left 01/28/2017   Procedure: CYSTOSCOPY/RETROGRADE/URETEROSCOPY (FLEX 6 fr) THULIUM LASER OF TUMOR;  Surgeon: Franchot Gallo, MD;  Location: [facility];  Service: Urology;  Laterality: Left;  . CYSTOSCOPY/RETROGRADE/URETEROSCOPY Left 04/10/2017   Procedure: CYSTOSCOPY, LEFT RETROGRADE PYELOGRAM LEFT URETEROSCOPY;  Surgeon: Cleon Gustin, MD;  Location: [facility];  Service: Urology;  Laterality: Left;  . EYE SURGERY     Cataract with lens- bil  . HOLMIUM LASER APPLICATION Left 0/34/7425   Procedure: HOLMIUM LASER ABLATION LEFT URETERAL TUMOR;  Surgeon: Cleon Gustin, MD;  Location: [facility];  Service: Urology;  Laterality: Left;  . OOPHORECTOMY    . ovary tumor removal     after son was born  . PARASTOMAL HERNIA REPAIR  10/30/2012  . POLYPECTOMY    . RECTUM REMOVAL     1/11 DR TSUEI  . TRANSURETHRAL RESECTION OF BLADDER TUMOR N/A 10/21/2017   Procedure: TRANSURETHRAL RESECTION OF BLADDER TUMOR (TURBT);  Surgeon: Cleon Gustin, MD;  Location: [facility];  Service: Urology;  Laterality: N/A;  . TUBAL LIGATION    . VAGINAL HYSTERECTOMY    . VENTRAL HERNIA REPAIR  10/30/2012   Procedure: LAPAROSCOPIC VENTRAL HERNIA;  Surgeon: Imogene Burn. Georgette Dover, MD;  Location: [facility];  Service: General;  Laterality: N/A;  Laparoscopic repair of parastomal hernia    Family History  Problem Relation Age of Onset  . Heart failure Sister   . Cervical cancer Sister   . Heart failure Brother   . Colon cancer Brother   . Colon cancer Brother   . Heart failure Father   . Cancer Mother        HEAD AND NECK  . Cervical cancer Daughter   . Kidney cancer Daughter     Allergies  Allergen Reactions  . Percocet [Oxycodone-Acetaminophen] Other (See Comments)    Upsets stomach- does tolerate   . Influenza Virus Vacc Split Pf Other (See Comments)    Unknown  But had to be hospitalized -1966  . Sulfamethoxazole-Trimethoprim Nausea And Vomiting  . Hibiclens [Chlorhexidine Gluconate] Rash    Current Outpatient  Medications on File Prior to Visit  Medication Sig Dispense Refill  . acetaminophen (TYLENOL) 325 MG tablet Take 650 mg by mouth every 6 (six) hours as needed for mild pain or headache.     Marland Kitchen acyclovir (ZOVIRAX) 200 MG capsule Take 1 capsule (200 mg total) by mouth 5 (five) times daily. 50 capsule 1  . atorvastatin (LIPITOR) 10 MG tablet TAKE 1 TABLET DAILY (Patient taking differently: TAKE 1 TABLET DAILY IN THE EVENING.) 90 tablet 2  . diltiazem (TIAZAC) 180 MG 24 hr capsule TAKE 1 CAPSULE DAILY 90 capsule 3  . HYDROcodone-acetaminophen (NORCO/VICODIN) 5-325 MG tablet Take  1 tablet by mouth every 4 (four) hours as needed for severe pain. 60 tablet 0  . levothyroxine (SYNTHROID, LEVOTHROID) 75 MCG tablet Take 1 tablet (75 mcg total) by mouth daily. 30 tablet 0  . lidocaine (LIDODERM) 5 % Remove & Discard patch within 12 hours to affected areas. 30 patch 2  . meclizine (ANTIVERT) 25 MG tablet Take 1 tablet (25 mg total) by mouth 3 (three) times daily as needed for dizziness. 30 tablet 1  . PROAIR HFA 108 (90 Base) MCG/ACT inhaler USE 2 INHALATIONS INTO THE LUNGS EVERY 4 HOURS AS NEEDED FOR SHORTNESS OF BREATH 25.5 g 5  . TOPROL XL 25 MG 24 hr tablet TAKE ONE-HALF (1/2) TABLET DAILY (Patient taking differently: Take 12.5 mg by mouth every evening. TAKE ONE-HALF (1/2) TABLET DAILY) 90 tablet 1  . traMADol-acetaminophen (ULTRACET) 37.5-325 MG tablet Take 1 tablet by mouth every 6 (six) hours as needed for moderate pain. 60 tablet 5   No current facility-administered medications on file prior to visit.     BP 132/86   Pulse 73   Temp 98.9 F (37.2 C)   Ht 5\' 3"  (1.6 m)   Wt 112 lb 4 oz (50.9 kg)   SpO2 96%   BMI 19.88 kg/m     Review of Systems  Constitutional: Negative.   HENT: Negative for congestion, dental problem, hearing loss, rhinorrhea, sinus pressure, sore throat and tinnitus.   Eyes: Negative for pain, discharge and visual disturbance.  Respiratory: Negative for cough and  shortness of breath.   Cardiovascular: Negative for chest pain, palpitations and leg swelling.  Gastrointestinal: Negative for abdominal distention, abdominal pain, blood in stool, constipation, diarrhea, nausea and vomiting.  Genitourinary: Positive for flank pain. Negative for difficulty urinating, dysuria, frequency, hematuria, pelvic pain, urgency, vaginal bleeding, vaginal discharge and vaginal pain.  Musculoskeletal: Negative for arthralgias, gait problem and joint swelling.  Skin: Negative for rash.  Neurological: Negative for dizziness, syncope, speech difficulty, weakness, numbness and headaches.  Hematological: Negative for adenopathy.  Psychiatric/Behavioral: Negative for agitation, behavioral problems and dysphoric mood. The patient is not nervous/anxious.        Objective:   Physical Exam  Constitutional: She appears well-nourished. No distress.  Skin:  Herpetic rash has resolved          Assessment & Plan:  Postherpetic neuralgia. Patient has had a very nice clinical response over the past 2 weeks.  She has tolerated Neurontin 100 mg 3 times daily quite well we will continue present dose Continue Aleve as needed  Follow-up in 3 months or as needed  [employer]

## 2018-02-11 ENCOUNTER — Encounter (HOSPITAL_COMMUNITY): Payer: Self-pay

## 2018-02-11 ENCOUNTER — Encounter (HOSPITAL_COMMUNITY)
Admission: RE | Admit: 2018-02-11 | Discharge: 2018-02-11 | Disposition: A | Discharge status: Home / Self Care | Payer: Medicare Other | Source: Ambulatory Visit | Attending: Urology | Admitting: Urology

## 2018-02-12 ENCOUNTER — Encounter (HOSPITAL_COMMUNITY): Payer: Self-pay | Admitting: *Deleted

## 2018-02-12 ENCOUNTER — Encounter (HOSPITAL_COMMUNITY)
Admission: RE | Disposition: A | Discharge status: Home / Self Care | Payer: Self-pay | Source: Ambulatory Visit | Attending: Urology

## 2018-02-12 ENCOUNTER — Ambulatory Visit (HOSPITAL_COMMUNITY): Payer: Medicare Other | Admitting: Anesthesiology

## 2018-02-12 ENCOUNTER — Ambulatory Visit (HOSPITAL_COMMUNITY): Payer: Medicare Other

## 2018-02-12 ENCOUNTER — Ambulatory Visit (HOSPITAL_COMMUNITY)
Admission: RE | Admit: 2018-02-12 | Discharge: 2018-02-12 | Disposition: A | Discharge status: Home / Self Care | Payer: Medicare Other | Source: Ambulatory Visit | Attending: Urology | Admitting: Urology

## 2018-02-12 DIAGNOSIS — Z808 Family history of malignant neoplasm of other organs or systems: Secondary | ICD-10-CM | POA: Insufficient documentation

## 2018-02-12 DIAGNOSIS — N132 Hydronephrosis with renal and ureteral calculous obstruction: Secondary | ICD-10-CM | POA: Diagnosis not present

## 2018-02-12 DIAGNOSIS — Z87442 Personal history of urinary calculi: Secondary | ICD-10-CM | POA: Diagnosis not present

## 2018-02-12 DIAGNOSIS — J439 Emphysema, unspecified: Secondary | ICD-10-CM | POA: Insufficient documentation

## 2018-02-12 DIAGNOSIS — N210 Calculus in bladder: Secondary | ICD-10-CM | POA: Diagnosis not present

## 2018-02-12 DIAGNOSIS — Z8051 Family history of malignant neoplasm of kidney: Secondary | ICD-10-CM | POA: Insufficient documentation

## 2018-02-12 DIAGNOSIS — Z8249 Family history of ischemic heart disease and other diseases of the circulatory system: Secondary | ICD-10-CM | POA: Diagnosis not present

## 2018-02-12 DIAGNOSIS — N1330 Unspecified hydronephrosis: Secondary | ICD-10-CM | POA: Insufficient documentation

## 2018-02-12 DIAGNOSIS — E785 Hyperlipidemia, unspecified: Secondary | ICD-10-CM | POA: Insufficient documentation

## 2018-02-12 DIAGNOSIS — Z9049 Acquired absence of other specified parts of digestive tract: Secondary | ICD-10-CM | POA: Diagnosis not present

## 2018-02-12 DIAGNOSIS — Z85048 Personal history of other malignant neoplasm of rectum, rectosigmoid junction, and anus: Secondary | ICD-10-CM | POA: Insufficient documentation

## 2018-02-12 DIAGNOSIS — N135 Crossing vessel and stricture of ureter without hydronephrosis: Secondary | ICD-10-CM | POA: Diagnosis not present

## 2018-02-12 DIAGNOSIS — E039 Hypothyroidism, unspecified: Secondary | ICD-10-CM | POA: Insufficient documentation

## 2018-02-12 DIAGNOSIS — Z87891 Personal history of nicotine dependence: Secondary | ICD-10-CM | POA: Insufficient documentation

## 2018-02-12 DIAGNOSIS — I10 Essential (primary) hypertension: Secondary | ICD-10-CM | POA: Diagnosis not present

## 2018-02-12 DIAGNOSIS — K219 Gastro-esophageal reflux disease without esophagitis: Secondary | ICD-10-CM | POA: Diagnosis not present

## 2018-02-12 DIAGNOSIS — R51 Headache: Secondary | ICD-10-CM | POA: Diagnosis not present

## 2018-02-12 DIAGNOSIS — M810 Age-related osteoporosis without current pathological fracture: Secondary | ICD-10-CM | POA: Insufficient documentation

## 2018-02-12 DIAGNOSIS — Z85528 Personal history of other malignant neoplasm of kidney: Secondary | ICD-10-CM | POA: Insufficient documentation

## 2018-02-12 DIAGNOSIS — M1990 Unspecified osteoarthritis, unspecified site: Secondary | ICD-10-CM | POA: Insufficient documentation

## 2018-02-12 DIAGNOSIS — Z90710 Acquired absence of both cervix and uterus: Secondary | ICD-10-CM | POA: Insufficient documentation

## 2018-02-12 DIAGNOSIS — I2510 Atherosclerotic heart disease of native coronary artery without angina pectoris: Secondary | ICD-10-CM | POA: Diagnosis not present

## 2018-02-12 DIAGNOSIS — Z79899 Other long term (current) drug therapy: Secondary | ICD-10-CM | POA: Diagnosis not present

## 2018-02-12 DIAGNOSIS — C679 Malignant neoplasm of bladder, unspecified: Secondary | ICD-10-CM | POA: Insufficient documentation

## 2018-02-12 DIAGNOSIS — X58XXXA Exposure to other specified factors, initial encounter: Secondary | ICD-10-CM | POA: Insufficient documentation

## 2018-02-12 DIAGNOSIS — T8389XA Other specified complication of genitourinary prosthetic devices, implants and grafts, initial encounter: Secondary | ICD-10-CM | POA: Insufficient documentation

## 2018-02-12 DIAGNOSIS — D649 Anemia, unspecified: Secondary | ICD-10-CM | POA: Diagnosis not present

## 2018-02-12 DIAGNOSIS — N3289 Other specified disorders of bladder: Secondary | ICD-10-CM | POA: Diagnosis not present

## 2018-02-12 DIAGNOSIS — M419 Scoliosis, unspecified: Secondary | ICD-10-CM | POA: Insufficient documentation

## 2018-02-12 DIAGNOSIS — Z882 Allergy status to sulfonamides status: Secondary | ICD-10-CM | POA: Insufficient documentation

## 2018-02-12 DIAGNOSIS — Z800 Family history of malignant neoplasm of digestive organs: Secondary | ICD-10-CM | POA: Diagnosis not present

## 2018-02-12 DIAGNOSIS — N3941 Urge incontinence: Secondary | ICD-10-CM | POA: Insufficient documentation

## 2018-02-12 HISTORY — PX: TRANSURETHRAL RESECTION OF BLADDER TUMOR: SHX2575

## 2018-02-12 HISTORY — PX: CYSTOSCOPY W/ URETERAL STENT PLACEMENT: SHX1429

## 2018-02-12 SURGERY — TURBT (TRANSURETHRAL RESECTION OF BLADDER TUMOR)
Anesthesia: General

## 2018-02-12 MED ORDER — ROCURONIUM BROMIDE 50 MG/5ML IV SOLN
INTRAVENOUS | Status: AC
  Filled 2018-02-12: qty 3

## 2018-02-12 MED ORDER — SODIUM CHLORIDE 0.9 % IR SOLN
Status: DC | PRN
  Administered 2018-02-12: 3000 mL via INTRAVESICAL

## 2018-02-12 MED ORDER — ROCURONIUM BROMIDE 100 MG/10ML IV SOLN
INTRAVENOUS | Status: DC | PRN
  Administered 2018-02-12: 30 mg via INTRAVENOUS
  Administered 2018-02-12: 5 mg via INTRAVENOUS

## 2018-02-12 MED ORDER — FENTANYL CITRATE (PF) 100 MCG/2ML IJ SOLN
INTRAMUSCULAR | Status: AC
  Filled 2018-02-12: qty 2

## 2018-02-12 MED ORDER — PROPOFOL 10 MG/ML IV BOLUS
INTRAVENOUS | Status: DC | PRN
  Administered 2018-02-12: 60 mg via INTRAVENOUS

## 2018-02-12 MED ORDER — LACTATED RINGERS IV SOLN
INTRAVENOUS | Status: DC
  Administered 2018-02-12: 1000 mL via INTRAVENOUS

## 2018-02-12 MED ORDER — PROPOFOL 10 MG/ML IV BOLUS
INTRAVENOUS | Status: AC
  Filled 2018-02-12: qty 20

## 2018-02-12 MED ORDER — ONDANSETRON 4 MG PO TBDP
4.0000 mg | ORAL_TABLET | Freq: Once | ORAL | Status: AC
  Administered 2018-02-12: 4 mg via ORAL
  Filled 2018-02-12: qty 1

## 2018-02-12 MED ORDER — FENTANYL CITRATE (PF) 100 MCG/2ML IJ SOLN
INTRAMUSCULAR | Status: DC | PRN
  Administered 2018-02-12 (×2): 25 ug via INTRAVENOUS

## 2018-02-12 MED ORDER — LIDOCAINE HCL (PF) 1 % IJ SOLN
INTRAMUSCULAR | Status: AC
  Filled 2018-02-12: qty 15

## 2018-02-12 MED ORDER — DIATRIZOATE MEGLUMINE 30 % UR SOLN
URETHRAL | Status: DC | PRN
  Administered 2018-02-12: 300 mL via URETHRAL

## 2018-02-12 MED ORDER — MIDAZOLAM HCL 2 MG/2ML IJ SOLN
1.0000 mg | Freq: Once | INTRAMUSCULAR | Status: AC | PRN
  Administered 2018-02-12: 1 mg via INTRAVENOUS
  Filled 2018-02-12: qty 2

## 2018-02-12 MED ORDER — CEFAZOLIN SODIUM-DEXTROSE 2-4 GM/100ML-% IV SOLN
2.0000 g | INTRAVENOUS | Status: AC
  Administered 2018-02-12: 2 g via INTRAVENOUS
  Filled 2018-02-12: qty 100

## 2018-02-12 MED ORDER — SUGAMMADEX SODIUM 200 MG/2ML IV SOLN
INTRAVENOUS | Status: AC
  Filled 2018-02-12: qty 2

## 2018-02-12 MED ORDER — SUGAMMADEX SODIUM 200 MG/2ML IV SOLN
INTRAVENOUS | Status: DC | PRN
  Administered 2018-02-12: 101.8 mg via INTRAVENOUS

## 2018-02-12 MED ORDER — DIATRIZOATE MEGLUMINE 30 % UR SOLN
URETHRAL | Status: AC
  Filled 2018-02-12: qty 100

## 2018-02-12 MED ORDER — TRAMADOL-ACETAMINOPHEN 37.5-325 MG PO TABS: 1.0000 | ORAL_TABLET | Freq: Four times a day (QID) | ORAL | 0 refills | Status: DC | PRN

## 2018-02-12 SURGICAL SUPPLY — 37 items
BAG DRAIN CYSTO-URO STER (DRAIN) ×4 IMPLANT
BAG DRAIN URO TABLE W/ADPT NS (DRAPE) ×4 IMPLANT
BAG HAMPER (MISCELLANEOUS) ×4 IMPLANT
BAG URINE DRAINAGE (UROLOGICAL SUPPLIES) ×4 IMPLANT
CATH FOLEY 3WAY 30CC 22F (CATHETERS) ×4 IMPLANT
CATH FOLEY LATEX FREE 22FR (CATHETERS)
CATH FOLEY LF 22FR (CATHETERS) IMPLANT
CATH INTERMIT  6FR 70CM (CATHETERS) ×4 IMPLANT
CLOTH BEACON ORANGE TIMEOUT ST (SAFETY) ×4 IMPLANT
DECANTER SPIKE VIAL GLASS SM (MISCELLANEOUS) ×4 IMPLANT
ELECT LOOP 22F BIPOLAR SML (ELECTROSURGICAL) ×4
ELECT REM PT RETURN 9FT ADLT (ELECTROSURGICAL) ×4
ELECTRODE LOOP 22F BIPOLAR SML (ELECTROSURGICAL) ×2 IMPLANT
ELECTRODE REM PT RTRN 9FT ADLT (ELECTROSURGICAL) ×2 IMPLANT
EXTRACTOR STONE NITINOL NGAGE (UROLOGICAL SUPPLIES) ×4 IMPLANT
FIBER LASER FLEXIVA 200 (UROLOGICAL SUPPLIES) ×4 IMPLANT
GLOVE BIO SURGEON STRL SZ8 (GLOVE) ×4 IMPLANT
GLOVE BIOGEL PI IND STRL 7.0 (GLOVE) ×2 IMPLANT
GLOVE BIOGEL PI INDICATOR 7.0 (GLOVE) ×2
GOWN STRL REUS W/ TWL LRG LVL3 (GOWN DISPOSABLE) ×2 IMPLANT
GOWN STRL REUS W/ TWL XL LVL3 (GOWN DISPOSABLE) ×2 IMPLANT
GOWN STRL REUS W/TWL LRG LVL3 (GOWN DISPOSABLE) ×6 IMPLANT
GOWN STRL REUS W/TWL XL LVL3 (GOWN DISPOSABLE) ×6 IMPLANT
GUIDEWIRE STR DUAL SENSOR (WIRE) ×8 IMPLANT
GUIDEWIRE STR ZIPWIRE 035X150 (MISCELLANEOUS) ×4 IMPLANT
IV NS IRRIG 3000ML ARTHROMATIC (IV SOLUTION) ×4 IMPLANT
KIT ROOM TURNOVER AP CYSTO (KITS) ×4 IMPLANT
MANIFOLD NEPTUNE II (INSTRUMENTS) ×4 IMPLANT
PACK CYSTO (CUSTOM PROCEDURE TRAY) ×4 IMPLANT
PAD ARMBOARD 7.5X6 YLW CONV (MISCELLANEOUS) ×4 IMPLANT
PLUG CATH AND CAP STER (CATHETERS) ×4 IMPLANT
STENT CONTOUR 7FRX24 (STENTS) ×4 IMPLANT
STENT URET 6FRX26 CONTOUR (STENTS) IMPLANT
SYR 30ML LL (SYRINGE) ×4 IMPLANT
SYRINGE 10CC LL (SYRINGE) ×4 IMPLANT
SYRINGE IRR TOOMEY STRL 70CC (SYRINGE) IMPLANT
TOWEL OR 17X26 4PK STRL BLUE (TOWEL DISPOSABLE) ×4 IMPLANT

## 2018-02-12 NOTE — Anesthesia Procedure Notes (Signed)
Procedure Name: Intubation Date/Time: 02/12/2018 12:31 PM Performed by: Andree Elk, Kristell Wooding A, CRNA Pre-anesthesia Checklist: Patient identified, Patient being monitored, Timeout performed, Emergency Drugs available and Suction available Patient Re-evaluated:Patient Re-evaluated prior to induction Oxygen Delivery Method: Circle System Utilized Preoxygenation: Pre-oxygenation with 100% oxygen Induction Type: IV induction Ventilation: Mask ventilation without difficulty Laryngoscope Size: Mac and 3 Grade View: Grade I Tube type: Oral Tube size: 7.0 mm Number of attempts: 1 Airway Equipment and Method: Stylet Placement Confirmation: ETT inserted through vocal cords under direct vision,  positive ETCO2 and breath sounds checked- equal and bilateral Secured at: 21 cm Tube secured with: Tape Dental Injury: Teeth and Oropharynx as per pre-operative assessment

## 2018-02-12 NOTE — Discharge Instructions (Signed)
Indwelling Urinary Catheter Care, Adult Take good care of your catheter to keep it working and to prevent problems. How to wear your catheter Attach your catheter to your leg with tape (adhesive tape) or a leg strap. Make sure it is not too tight. If you use tape, remove any bits of tape that are already on the catheter. How to wear a drainage bag You should have:  A large overnight bag.  A small leg bag.  Overnight Bag You may wear the overnight bag at any time. Always keep the bag below the level of your bladder but off the floor. When you sleep, put a clean plastic bag in a wastebasket. Then hang the bag inside the wastebasket. Leg Bag Never wear the leg bag at night. Always wear the leg bag below your knee. Keep the leg bag secure with a leg strap or tape. How to care for your skin  Clean the skin around the catheter at least once every day.  Shower every day. Do not take baths.  Put creams, lotions, or ointments on your genital area only as told by your doctor.  Do not use powders, sprays, or lotions on your genital area. How to clean your catheter and your skin 1. Wash your hands with soap and water. 2. Wet a washcloth in warm water and gentle (mild) soap. 3. Use the washcloth to clean the skin where the catheter enters your body. Clean downward and wipe away from the catheter in small circles. Do not wipe toward the catheter. 4. Pat the area dry with a clean towel. Make sure to clean off all soap. How to care for your drainage bags Empty your drainage bag when it is ?- full or at least 2-3 times a day. Replace your drainage bag once a month or sooner if it starts to smell bad or look dirty. Do not clean your drainage bag unless told by your doctor. Emptying a drainage bag  Supplies Needed  Rubbing alcohol.  Gauze pad or cotton ball.  Tape or a leg strap.  Steps 1. Wash your hands with soap and water. 2. Separate (detach) the bag from your leg. 3. Hold the bag over  the toilet or a clean container. Keep the bag below your hips and bladder. This stops pee (urine) from going back into the tube. 4. Open the pour spout at the bottom of the bag. 5. Empty the pee into the toilet or container. Do not let the pour spout touch any surface. 6. Put rubbing alcohol on a gauze pad or cotton ball. 7. Use the gauze pad or cotton ball to clean the pour spout. 8. Close the pour spout. 9. Attach the bag to your leg with tape or a leg strap. 10. Wash your hands.  Changing a drainage bag Supplies Needed  Alcohol wipes.  A clean drainage bag.  Adhesive tape or a leg strap.  Steps 1. Wash your hands with soap and water. 2. Separate the dirty bag from your leg. 3. Pinch the rubber catheter with your fingers so that pee does not spill out. 4. Separate the catheter tube from the drainage tube where these tubes connect (at the connection valve). Do not let the tubes touch any surface. 5. Clean the end of the catheter tube with an alcohol wipe. Use a different alcohol wipe to clean the end of the drainage tube. 6. Connect the catheter tube to the drainage tube of the clean bag. 7. Attach the new bag to  the leg with adhesive tape or a leg strap. 8. Wash your hands.  How to prevent infection and other problems  Never pull on your catheter or try to remove it. Pulling can damage tissue in your body.  Always wash your hands before and after touching your catheter.  If a leg strap gets wet, replace it with a dry one.  Drink enough fluids to keep your pee clear or pale yellow, or as told by your doctor.  Do not let the drainage bag or tubing touch the floor.  Wear cotton underwear.  If you are female, wipe from front to back after you poop (have a bowel movement).  Check on the catheter often to make sure it works and the tubing is not twisted. Get help if:  Your pee is cloudy.  Your pee smells unusually bad.  Your pee is not draining into the bag.  Your  tube gets clogged.  Your catheter starts to leak.  Your bladder feels full. Get help right away if:  You have redness, swelling, or pain where the catheter enters your body.  You have fluid, pus, or a bad smell coming from the area where the catheter enters your body.  The area where the catheter enters your body feels warm.  You have a fever.  You have pain in your: ? Stomach (abdomen). ? Legs. ? Lower back. ? Bladder.  You see blood fill the catheter.  Your pee is pink or red.  You feel sick to your stomach (nauseous).  You throw up (vomit).  You have chills.  Your catheter gets pulled out. This information is not intended to replace advice given to you by your health care provider. Make sure you discuss any questions you have with your health care provider. Document Released: 03/30/2013 Document Revised: 10/31/2016 Document Reviewed: 05/18/2014 Elsevier Interactive Patient Education  2018 [place] Anesthesia, Adult, Care After These instructions provide you with information about caring for yourself after your procedure. Your health care provider may also give you more specific instructions. Your treatment has been planned according to current medical practices, but problems sometimes occur. Call your health care provider if you have any problems or questions after your procedure. What can I expect after the procedure? After the procedure, it is common to have:  Vomiting.  A sore throat.  Mental slowness.  It is common to feel:  Nauseous.  Cold or shivery.  Sleepy.  Tired.  Sore or achy, even in parts of your body where you did not have surgery.  Follow these instructions at home: For at least 24 hours after the procedure:  Do not: ? Participate in activities where you could fall or become injured. ? Drive. ? Use heavy machinery. ? Drink alcohol. ? Take sleeping pills or medicines that cause drowsiness. ? Make important decisions or  sign legal documents. ? Take care of children on your own.  Rest. Eating and drinking  If you vomit, drink water, juice, or soup when you can drink without vomiting.  Drink enough fluid to keep your urine clear or pale yellow.  Make sure you have little or no nausea before eating solid foods.  Follow the diet recommended by your health care provider. General instructions  Have a responsible adult stay with you until you are awake and alert.  Return to your normal activities as told by your health care provider. Ask your health care provider what activities are safe for you.  Take over-the-counter and prescription  medicines only as told by your health care provider.  If you smoke, do not smoke without supervision.  Keep all follow-up visits as told by your health care provider. This is important. Contact a health care provider if:  You continue to have nausea or vomiting at home, and medicines are not helpful.  You cannot drink fluids or start eating again.  You cannot urinate after 8-12 hours.  You develop a skin rash.  You have fever.  You have increasing redness at the site of your procedure. Get help right away if:  You have difficulty breathing.  You have chest pain.  You have unexpected bleeding.  You feel that you are having a life-threatening or urgent problem. This information is not intended to replace advice given to you by your health care provider. Make sure you discuss any questions you have with your health care provider. Document Released: 03/11/2001 Document Revised: 05/07/2016 Document Reviewed: 11/17/2015 Elsevier Interactive Patient Education  [employer].

## 2018-02-12 NOTE — Transfer of Care (Signed)
Immediate Anesthesia Transfer of Care Note  Patient: Sherry [name]  Procedure(s) Performed: TRANSURETHRAL RESECTION OF BLADDER TUMOR (TURBT) (N/A ) CYSTOSCOPY WITH RETROGRADE PYELOGRAM/URETERAL STENT EXCHANGE (Left )  Patient Location: PACU  Anesthesia Type:General  Level of Consciousness: awake, oriented and patient cooperative  Airway & Oxygen Therapy: Patient Spontanous Breathing and Patient connected to face mask oxygen  Post-op Assessment: Report given to RN and Post -op Vital signs reviewed and stable  Post vital signs: Reviewed and stable  Last Vitals:  Vitals:   02/12/18 1042 02/12/18 1200  BP: 137/76 131/87  Resp: 20 (!) 22  Temp: 36.6 C   SpO2: 94% 100%    Last Pain:  Vitals:   02/12/18 1042  TempSrc: Oral      Patients Stated Pain Goal: 6 (35/46/56 8127)  Complications: No apparent anesthesia complications

## 2018-02-12 NOTE — Anesthesia Postprocedure Evaluation (Signed)
Anesthesia Post Note  Patient: BRICELYN FREESTONE  Procedure(s) Performed: TRANSURETHRAL RESECTION OF BLADDER TUMOR (TURBT) (N/A ) CYSTOSCOPY WITH RETROGRADE PYELOGRAM/URETERAL STENT EXCHANGE/HOLMIUM LASER (Left )  Patient location during evaluation: PACU Anesthesia Type: General Level of consciousness: awake and alert, oriented and patient cooperative Pain management: pain level controlled Vital Signs Assessment: post-procedure vital signs reviewed and stable Respiratory status: spontaneous breathing Cardiovascular status: stable Postop Assessment: no apparent nausea or vomiting Anesthetic complications: no     Last Vitals:  Vitals:   02/12/18 1200 02/12/18 1410  BP: 131/87 (!) 143/77  Pulse:  77  Resp: (!) 22 17  Temp:  36.9 C  SpO2: 100% 100%    Last Pain:  Vitals:   02/12/18 1042  TempSrc: Oral                 ADAMS, AMY A

## 2018-02-12 NOTE — Brief Op Note (Signed)
02/12/2018  1:51 PM  PATIENT:  Sherry [name]  84 y.o. female  PRE-OPERATIVE DIAGNOSIS:  BLADDER CANCER, LEFT URETERAL STRICTURE  POST-OPERATIVE DIAGNOSIS:  BLADDER CANCER, LEFT URETERAL STRICTURE  PROCEDURE:  Procedure(s): TRANSURETHRAL RESECTION OF BLADDER TUMOR (TURBT) (N/A) CYSTOSCOPY WITH RETROGRADE PYELOGRAM/URETERAL STENT EXCHANGE (Left)  Left ureteroscopic stone extraction with laser  SURGEON:  Surgeon(s) and Role:    * Lamar Naef, Candee Furbish, MD - Primary  PHYSICIAN ASSISTANT:   ASSISTANTS: none   ANESTHESIA:   general  EBL:  none  BLOOD ADMINISTERED:none  DRAINS: left 7x24 JJ ureteral stent  16 french foley  LOCAL MEDICATIONS USED:  NONE  SPECIMEN:  Source of Specimen:  bladder tumor  DISPOSITION OF SPECIMEN:  PATHOLOGY  COUNTS:  YES  TOURNIQUET:  * No tourniquets in log *  DICTATION: .Note written in EPIC  PLAN OF CARE: Discharge to home after PACU  PATIENT DISPOSITION:  PACU - hemodynamically stable.   Delay start of Pharmacological VTE agent (>24hrs) due to surgical blood loss or risk of bleeding: not applicable

## 2018-02-12 NOTE — H&P (Signed)
Urology Admission H&P   Chief Complaint: bladder cancer  History of Present Illness: Sherry [name] is a 84yo with a hx of left renal eplvis TCC, left ureteral stricture and bladder cancer. Her stricture is managed with an indwelling stent. She has a bladdwer cancer recurrance. She has severe LUTS inclduing urge incontinence with the stent in place  Past Medical History:  Diagnosis Date  . Anemia   . Arthritis   . Bronchitis   . CAD (coronary artery disease)   . COPD (chronic obstructive pulmonary disease) ([place])   . Emphysema of lung ([place])   . GERD (gastroesophageal reflux disease)   . Headache(784.0)   . History of blood transfusion   . History of kidney stones   . Hyperlipidemia   . Hypertension   . Hypothyroidism   . Osteoporosis   . Rectal adenocarcinoma ([place]) dx'd 08/2009  . Rectal cancer ([place])   . Scoliosis    per Dr Melony Overly ortho  . Shingles   . Shortness of breath   . Tendon adhesions    torn tendon right shoulder   Past Surgical History:  Procedure Laterality Date  . APPENDECTOMY    . BALLOON DILATION Left 04/10/2017   Procedure: BALLOON DILATION OF LEFT URETERAL STRICTURE;  Surgeon: Cleon Gustin, MD;  Location: [facility];  Service: Urology;  Laterality: Left;  . CARDIAC CATHETERIZATION    . CHOLECYSTECTOMY    . COLON REMOVAL     8 " 12/2009 DR TSUEI  . COLON SURGERY     apr for rectal cancer 2011  . COLONOSCOPY    . COLOSTOMY    . CYSTOSCOPY W/ URETERAL STENT PLACEMENT Left 03/18/2017   Procedure: CYSTOSCOPY WITH RETROGRADE PYELOGRAM/URETERAL STENT PLACEMENT AND URETERAL DIAGNOSTIC LEFT;  Surgeon: Cleon Gustin, MD;  Location: [facility];  Service: Urology;  Laterality: Left;  1 HR [phone] [id number] A [phone] - pt to arrive at 7:00 to receive blood  . CYSTOSCOPY W/ URETERAL STENT PLACEMENT Left 04/10/2017   Procedure: CYSTOSCOPY WITH STENT REPLACEMENT;  Surgeon: Cleon Gustin, MD;  Location: [facility];  Service: Urology;  Laterality:  Left;  . CYSTOSCOPY W/ URETERAL STENT PLACEMENT Left 06/03/2017   Procedure: CYSTOSCOPY WITH LEFT RETROGRADE PYELOGRAM/LEFT URETERAL  BALLOON DILATION AND LEFT URETERAL STENT PLACEMENT;  Surgeon: Cleon Gustin, MD;  Location: [facility];  Service: Urology;  Laterality: Left;  . CYSTOSCOPY W/ URETERAL STENT PLACEMENT Left 09/16/2017   Procedure: CYSTOSCOPY WITH LEFT STENT EXCHANGE/ RETROGRADE;  Surgeon: Cleon Gustin, MD;  Location: [facility];  Service: Urology;  Laterality: Left;  NEEDS TOTAL 30 MIN  . CYSTOSCOPY WITH RETROGRADE PYELOGRAM, URETEROSCOPY AND STENT PLACEMENT Left 03/29/2016   Procedure: CYSTOSCOPY WITH RETROGRADE PYELOGRAM, URETEROSCOPY AND STENT PLACEMENT;  Surgeon: Kathie Rhodes, MD;  Location: [facility];  Service: Urology;  Laterality: Left;  With Laser  . CYSTOSCOPY/RETROGRADE/URETEROSCOPY Left 01/28/2017   Procedure: CYSTOSCOPY/RETROGRADE/URETEROSCOPY (FLEX 6 fr) THULIUM LASER OF TUMOR;  Surgeon: Franchot Gallo, MD;  Location: [facility];  Service: Urology;  Laterality: Left;  . CYSTOSCOPY/RETROGRADE/URETEROSCOPY Left 04/10/2017   Procedure: CYSTOSCOPY, LEFT RETROGRADE PYELOGRAM LEFT URETEROSCOPY;  Surgeon: Cleon Gustin, MD;  Location: [facility];  Service: Urology;  Laterality: Left;  . EYE SURGERY     Cataract with lens- bil  . HOLMIUM LASER APPLICATION Left 9/62/9528   Procedure: HOLMIUM LASER ABLATION LEFT URETERAL TUMOR;  Surgeon: Cleon Gustin, MD;  Location: [facility];  Service: Urology;  Laterality: Left;  . OOPHORECTOMY    . ovary tumor  removal     after son was born  . PARASTOMAL HERNIA REPAIR  10/30/2012  . POLYPECTOMY    . RECTUM REMOVAL     1/11 DR TSUEI  . TRANSURETHRAL RESECTION OF BLADDER TUMOR N/A 10/21/2017   Procedure: TRANSURETHRAL RESECTION OF BLADDER TUMOR (TURBT);  Surgeon: Cleon Gustin, MD;  Location: [facility];  Service: Urology;  Laterality: N/A;  . TUBAL LIGATION    . VAGINAL HYSTERECTOMY    . VENTRAL HERNIA REPAIR  10/30/2012   Procedure:  LAPAROSCOPIC VENTRAL HERNIA;  Surgeon: Imogene Burn. Georgette Dover, MD;  Location: [facility];  Service: General;  Laterality: N/A;  Laparoscopic repair of parastomal hernia    Home Medications:  Current Facility-Administered Medications  Medication Dose Route Frequency Provider Last Rate Last Dose  . ceFAZolin (ANCEF) IVPB 2g/100 mL premix  2 g Intravenous 30 min Pre-Op Alyson Ingles Candee Furbish, MD      . lactated ringers infusion   Intravenous Continuous Mikey College, MD 10 mL/hr at 02/12/18 1115 1,000 mL at 02/12/18 1115   Allergies:  Allergies  Allergen Reactions  . Percocet [Oxycodone-Acetaminophen] Other (See Comments)    Upsets stomach- does tolerate   . Influenza Virus Vacc Split Pf Other (See Comments)    Unknown  But had to be hospitalized -1966  . Sulfamethoxazole-Trimethoprim Nausea And Vomiting  . Hibiclens [Chlorhexidine Gluconate] Rash    Family History  Problem Relation Age of Onset  . Heart failure Sister   . Cervical cancer Sister   . Heart failure Brother   . Colon cancer Brother   . Colon cancer Brother   . Heart failure Father   . Cancer Mother        HEAD AND NECK  . Cervical cancer Daughter   . Kidney cancer Daughter    Social History:  reports that she quit smoking about 14 years ago. Her smoking use included cigarettes. She has a 97.50 pack-year smoking history. she has never used smokeless tobacco. She reports that she does not drink alcohol or use drugs.  Review of Systems  Genitourinary: Positive for frequency and urgency.  All other systems reviewed and are negative.   Physical Exam:  Vital signs in last 24 hours: Temp:  [97.8 F (36.6 C)] 97.8 F (36.6 C) (02/27 1042) Resp:  [20] 20 (02/27 1042) BP: (137)/(76) 137/76 (02/27 1042) SpO2:  [94 %] 94 % (02/27 1042) Physical Exam  Constitutional: She is oriented to person, place, and time. She appears well-developed and well-nourished.  HENT:  Head: Normocephalic and atraumatic.  Eyes: EOM are normal.  Pupils are equal, round, and reactive to light.  Neck: Normal range of motion. No thyromegaly present.  Cardiovascular: Normal rate and regular rhythm.  Respiratory: Effort normal. No respiratory distress.  GI: Soft. She exhibits no distension.  Musculoskeletal: Normal range of motion. She exhibits no edema.  Neurological: She is alert and oriented to person, place, and time.  Skin: Skin is warm and dry.  Psychiatric: She has a normal mood and affect. Her behavior is normal. Judgment and thought content normal.    Laboratory Data:  No results found for this or any previous visit (from the past 24 hour(s)). No results found for this or any previous visit (from the past 240 hour(s)). Creatinine: No results for input(s): CREATININE in the last 168 hours. Baseline Creatinine: unknwon  Impression/Assessment:  84yo with left ureteral stricture and bladder cancer  Plan:  The risks/benefits/alternatives to TURBT and left ureteral stent exchange was explained  to the patient and she understands and wishes to proceed with surgery  [name] 02/12/2018, 12:13 PM

## 2018-02-12 NOTE — Anesthesia Preprocedure Evaluation (Signed)
Anesthesia Evaluation  Patient identified by MRN, date of birth, ID band Patient awake    Airway Mallampati: I  TM Distance: >3 FB Neck ROM: Full    Dental  (+) Edentulous Upper, Edentulous Lower   Pulmonary shortness of breath, COPD,  COPD inhaler, former smoker,    Pulmonary exam normal        Cardiovascular Exercise Tolerance: Good hypertension, + CAD  Normal cardiovascular exam Rhythm:Regular Rate:Normal  12-Mar-2017 11:21:52 [facility] ROUTINE RECORD Normal sinus rhythm Normal ECG  Denies CP/SOB   Neuro/Psych    GI/Hepatic GERD  Medicated,  Endo/Other  Hypothyroidism   Renal/GU Results for DAYSIA, VANDENBOOM (MRN [id number]) as of 02/12/2018 10:30  12/17/2017 17:19 Sodium: 136 Potassium: 4.0 Chloride: 104 CO2: 25 Glucose: 92 BUN: 17 Creatinine: 0.83      Musculoskeletal  (+) Arthritis ,   Abdominal Normal abdominal exam  (+)   Peds  Hematology  (+) anemia ,   Anesthesia Other Findings   Reproductive/Obstetrics                             Anesthesia Physical Anesthesia Plan  ASA: III  Anesthesia Plan: General   Post-op Pain Management:    Induction:   PONV Risk Score and Plan:   Airway Management Planned: LMA  Additional Equipment:   Intra-op Plan:   Post-operative Plan: Extubation in OR  Informed Consent: I have reviewed the patients History and Physical, chart, labs and discussed the procedure including the risks, benefits and alternatives for the proposed anesthesia with the patient or authorized representative who has indicated his/her understanding and acceptance.     Plan Discussed with: CRNA and Surgeon  Anesthesia Plan Comments:         Anesthesia Quick Evaluation

## 2018-02-13 ENCOUNTER — Encounter (HOSPITAL_COMMUNITY): Payer: Self-pay | Admitting: Urology

## 2018-02-17 NOTE — Op Note (Signed)
.  Preoperative diagnosis: Left encrusted ureteral stent  Postoperative diagnosis: Same  Procedure: 1 cystoscopy 2. Left retrograde pyelography 3.  Intraoperative fluoroscopy, under one hour, with interpretation 4.  Left ureteroscopic stone manipulation with laser lithotripsy 5.  Left 6 x 24 JJ stent exchange 6. cystolithalopaxy for a stone less than 2.5cm  Attending: Rosie Fate  Anesthesia: General  Estimated blood loss: None  Drains: Left 6 x 24 JJ ureteral stent without tether  Specimens: none  Antibiotics: ancef  Findings: multiple 0.5-1cm calculi growing on the proximal and distal curl of the ureteral stent. mild hydronephrosis. No masses/lesions in the bladder. Ureteral orifices in normal anatomic location.  Indications: Patient is a 84 year old female with a history of left ureteral stricture managed with an indwelling stent.  After discussing treatment options, she decided proceed with left stent exchange.  Procedure her in detail: The patient was brought to the operating room and a brief timeout was done to ensure correct patient, correct procedure, correct site.  General anesthesia was administered patient was placed in dorsal lithotomy position.  Her genitalia was then prepped and draped in usual sterile fashion.  A rigid 95 French cystoscope was passed in the urethra and the bladder.  Bladder was inspected free masses or lesions.  the ureteral orifices were in the normal orthotopic locations. Using a biopsy forceps the stone attached to the distal curl was fragmented and the fragments were removed from the bladder. We then used a grasper to bring the stent to the urethral meatus. We were unable to place a wire through the stent. a 6 french ureteral catheter was then instilled into the left ureteral orifice.  a gentle retrograde was obtained and findings noted above.  we then placed a zip wire through the ureteral catheter and advanced up to the renal pelvis. We were unable  to remove the stent. we then removed the cystoscope and cannulated the left ureteral orifice with a semirigid ureteroscope.  One we reached the proximal curl of the stent we noted multiple 1cm calculi attached. Using a 200nm laser fiber the stone were fragmented and the fragments were removed with an NGage basket.   Once all stone fragments were removed we then placed a 6 x 24 double-j ureteral stent over the original zip wire. We then removed the wire and good coil was noted in the the renal pelvis under fluoroscopy and the bladder under direct vision.  the bladder was then drained and this concluded the procedure which was well tolerated by patient.  Complications: None  Condition: Stable, extubated, transferred to PACU  Plan: Patient is to be discharged home as to follow-up in 1 month for a renal US

## 2018-02-19 ENCOUNTER — Ambulatory Visit (INDEPENDENT_AMBULATORY_CARE_PROVIDER_SITE_OTHER): Payer: Medicare Other | Admitting: Urology

## 2018-02-19 DIAGNOSIS — C678 Malignant neoplasm of overlapping sites of bladder: Secondary | ICD-10-CM | POA: Diagnosis not present

## 2018-02-19 DIAGNOSIS — N131 Hydronephrosis with ureteral stricture, not elsewhere classified: Secondary | ICD-10-CM

## 2018-02-25 ENCOUNTER — Other Ambulatory Visit: Payer: Self-pay | Admitting: Urology

## 2018-04-22 NOTE — Patient Instructions (Signed)
Sherry [name]  04/22/2018     @PREFPERIOPPHARMACY @   Your procedure is scheduled on   04/30/2018 .  Report to [name] at  1015   A.M.  Call this number if you have problems the morning of surgery:  [phone]   Remember:  Do not eat food or drink liquids after midnight.  Take these medicines the morning of surgery with A SIP OF WATER  Diltiazem, hydrocodone, levothyroxine, antivert, myrbetriq, pyridium, ultracet. Use your inhaler before you come and bring your rescue inhaler with you.   Do not wear jewelry, make-up or nail polish.  Do not wear lotions, powders, or perfumes, or deodorant.  Do not shave 48 hours prior to surgery.  Men may shave face and neck.  Do not bring valuables to the hospital.  [facility] is not responsible for any belongings or valuables.  Contacts, dentures or bridgework may not be worn into surgery.  Leave your suitcase in the car.  After surgery it may be brought to your room.  For patients admitted to the hospital, discharge time will be determined by your treatment team.  Patients discharged the day of surgery will not be allowed to drive home.   Name and phone number of your driver:   family Special instructions:  None  Please read over the following fact sheets that you were given. Anesthesia Post-op Instructions and Care and Recovery After Surgery       Cystoscopy Cystoscopy is a procedure that is used to help diagnose and sometimes treat conditions that affect that lower urinary tract. The lower urinary tract includes the bladder and the tube that drains urine from the bladder out of the body (urethra). Cystoscopy is performed with a thin, tube-shaped instrument with a light and camera at the end (cystoscope). The cystoscope may be hard (rigid) or flexible, depending on the goal of the procedure.The cystoscope is inserted through the urethra, into the bladder. Cystoscopy may be recommended if you have:  Urinary  tractinfections that keep coming back (recurring).  Blood in the urine (hematuria).  Loss of bladder control (urinary incontinence) or an overactive bladder.  Unusual cells found in a urine sample.  A blockage in the urethra.  Painful urination.  An abnormality in the bladder found during an intravenous pyelogram (IVP) or CT scan.  Cystoscopy may also be done to remove a sample of tissue to be examined under a microscope (biopsy). Tell a health care provider about:  Any allergies you have.  All medicines you are taking, including vitamins, herbs, eye drops, creams, and over-the-counter medicines.  Any problems you or family members have had with anesthetic medicines.  Any blood disorders you have.  Any surgeries you have had.  Any medical conditions you have.  Whether you are pregnant or may be pregnant. What are the risks? Generally, this is a safe procedure. However, problems may occur, including:  Infection.  Bleeding.  Allergic reactions to medicines.  Damage to other structures or organs.  What happens before the procedure?  Ask your health care provider about: ? Changing or stopping your regular medicines. This is especially important if you are taking diabetes medicines or blood thinners. ? Taking medicines such as aspirin and ibuprofen. These medicines can thin your blood. Do not take these medicines before your procedure if your health care provider instructs you not to.  Follow instructions from your health care provider about eating  or drinking restrictions.  You may be given antibiotic medicine to help prevent infection.  You may have an exam or testing, such as X-rays of the bladder, urethra, or kidneys.  You may have urine tests to check for signs of infection.  Plan to have someone take you home after the procedure. What happens during the procedure?  To reduce your risk of infection,your health care team will wash or sanitize their  hands.  You will be given one or more of the following: ? A medicine to help you relax (sedative). ? A medicine to numb the area (local anesthetic).  The area around the opening of your urethra will be cleaned.  The cystoscope will be passed through your urethra into your bladder.  Germ-free (sterile)fluid will flow through the cystoscope to fill your bladder. The fluid will stretch your bladder so that your surgeon can clearly examine your bladder walls.  The cystoscope will be removed and your bladder will be emptied. The procedure may vary among health care providers and hospitals. What happens after the procedure?  You may have some soreness or pain in your abdomen and urethra. Medicines will be available to help you.  You may have some blood in your urine.  Do not drive for 24 hours if you received a sedative. This information is not intended to replace advice given to you by your health care provider. Make sure you discuss any questions you have with your health care provider. Document Released: 11/30/2000 Document Revised: 04/12/2016 Document Reviewed: 10/20/2015 Elsevier Interactive Patient Education  2018 [employer].  Cystoscopy, Care After Refer to this sheet in the next few weeks. These instructions provide you with information about caring for yourself after your procedure. Your health care provider may also give you more specific instructions. Your treatment has been planned according to current medical practices, but problems sometimes occur. Call your health care provider if you have any problems or questions after your procedure. What can I expect after the procedure? After the procedure, it is common to have:  Mild pain when you urinate. Pain should stop within a few minutes after you urinate. This may last for up to 1 week.  A small amount of blood in your urine for several days.  Feeling like you need to urinate but producing only a small amount of  urine.  Follow these instructions at home:  Medicines  Take over-the-counter and prescription medicines only as told by your health care provider.  If you were prescribed an antibiotic medicine, take it as told by your health care provider. Do not stop taking the antibiotic even if you start to feel better. General instructions   Return to your normal activities as told by your health care provider. Ask your health care provider what activities are safe for you.  Do not drive for 24 hours if you received a sedative.  Watch for any blood in your urine. If the amount of blood in your urine increases, call your health care provider.  Follow instructions from your health care provider about eating or drinking restrictions.  If a tissue sample was removed for testing (biopsy) during your procedure, it is your responsibility to get your test results. Ask your health care provider or the department performing the test when your results will be ready.  Drink enough fluid to keep your urine clear or pale yellow.  Keep all follow-up visits as told by your health care provider. This is important. Contact a health care provider  if:  You have pain that gets worse or does not get better with medicine, especially pain when you urinate.  You have difficulty urinating. Get help right away if:  You have more blood in your urine.  You have blood clots in your urine.  You have abdominal pain.  You have a fever or chills.  You are unable to urinate. This information is not intended to replace advice given to you by your health care provider. Make sure you discuss any questions you have with your health care provider. Document Released: 06/22/2005 Document Revised: 05/10/2016 Document Reviewed: 10/20/2015 Elsevier Interactive Patient Education  2018 [employer].  Ureteral Stent Implantation Ureteral stent implantation is a procedure to insert (implant) a flexible, soft, plastic tube (stent)  into a tube (ureter) that drains urine from the kidneys. The stent supports the ureter while it heals and helps to drain urine from the kidneys. You may have a ureteral stent implanted after having a procedure to remove a blockage from the ureter (ureterolysis or pyeloplasty).You may also have a stent implanted to open the flow of urine when you have a blockage caused by a kidney stone, tumor, blood clot, or infection. You have two ureters, one on each side of the body. The ureters connect the kidneys to the organ that holds urine until it passes out of the body (bladder). The stent is placed so that one end is in the kidney, and one end is in the bladder. The stent is usually taken out after your ureter has healed. Depending on your condition, you may have a stent for just a few weeks, or you may have a long-term stent that will need to be replaced every few months. Tell a health care provider about:  Any allergies you have.  All medicines you are taking, including vitamins, herbs, eye drops, creams, and over-the-counter medicines.  Any problems you or family members have had with anesthetic medicines.  Any blood disorders you have.  Any surgeries you have had.  Any medical conditions you have.  Whether you are pregnant or may be pregnant. What are the risks? Generally, this is a safe procedure. However, problems may occur, including:  Infection.  Bleeding.  Allergic reactions to medicines.  Damage to other structures or organs. Tearing (perforation) of the ureter is possible.  Movement of the stent away from where it is placed during surgery (migration).  What happens before the procedure?  Ask your health care provider about: ? Changing or stopping your regular medicines. This is especially important if you are taking diabetes medicines or blood thinners. ? Taking medicines such as aspirin and ibuprofen. These medicines can thin your blood. Do not take these medicines before  your procedure if your health care provider instructs you not to.  Follow instructions from your health care provider about eating or drinking restrictions.  Do not drink alcohol and do not use any tobacco products before your procedure, as told by your health care provider.  You may be given antibiotic medicine to help prevent infection.  Plan to have someone take you home after the procedure.  If you go home right after the procedure, plan to have someone with you for 24 hours. What happens during the procedure?  An IV tube will be inserted into one of your veins.  You will be given a medicine to make you fall asleep (general anesthetic). You may also be given a medicine to help you relax (sedative).  A thin, tube-shaped instrument with  a light and tiny camera at the end (cystoscope) will be inserted into your urethra. The urethra is the tube that drains urine from the bladder out of the body. In men, the urethra opens at the end of the penis. In women, the urethra opens in front of the vaginal opening.  The cystoscope will be passed into your bladder.  A thin wire (guide wire) will be passed through your bladder and into your ureter. This is used to guide the stent into your ureter.  The stent will be inserted into your ureter.  The guide wire and the cystoscope will be removed.  A flexible tube (catheter) will be inserted through your urethra so that one end is in your bladder. This helps to drain urine from your bladder. The procedure may vary among hospitals and health care providers. What happens after the procedure?  Your blood pressure, heart rate, breathing rate, and blood oxygen level will be monitored often until the medicines you were given have worn off.  You may continue to receive medicine and fluids through an IV tube.  You may have some soreness or pain in your abdomen and urethra. Medicines will be available to help you.  You will be encouraged to get up and  walk around as soon as you can.  You will have a catheter draining your urine.  You will have some blood in your urine.  Do not drive for 24 hours if you received a sedative. This information is not intended to replace advice given to you by your health care provider. Make sure you discuss any questions you have with your health care provider. Document Released: 11/30/2000 Document Revised: 05/10/2016 Document Reviewed: 06/17/2015 Elsevier Interactive Patient Education  2018 [employer].  Ureteral Stent Implantation, Care After Refer to this sheet in the next few weeks. These instructions provide you with information about caring for yourself after your procedure. Your health care provider may also give you more specific instructions. Your treatment has been planned according to current medical practices, but problems sometimes occur. Call your health care provider if you have any problems or questions after your procedure. What can I expect after the procedure? After the procedure, it is common to have:  Nausea.  Mild pain when you urinate. You may feel this pain in your lower back or lower abdomen. Pain should stop within a few minutes after you urinate. This may last for up to 1 week.  A small amount of blood in your urine for several days.  Follow these instructions at home:  Medicines  Take over-the-counter and prescription medicines only as told by your health care provider.  If you were prescribed an antibiotic medicine, take it as told by your health care provider. Do not stop taking the antibiotic even if you start to feel better.  Do not drive for 24 hours if you received a sedative.  Do not drive or operate heavy machinery while taking prescription pain medicines. Activity  Return to your normal activities as told by your health care provider. Ask your health care provider what activities are safe for you.  Do not lift anything that is heavier than 10 lb (4.5 kg).  Follow this limit for 1 week after your procedure, or for as long as told by your health care provider. General instructions  Watch for any blood in your urine. Call your health care provider if the amount of blood in your urine increases.  If you have a catheter: ? Follow  instructions from your health care provider about taking care of your catheter and collection bag. ? Do not take baths, swim, or use a hot tub until your health care provider approves.  Drink enough fluid to keep your urine clear or pale yellow.  Keep all follow-up visits as told by your health care provider. This is important. Contact a health care provider if:  You have pain that gets worse or does not get better with medicine, especially pain when you urinate.  You have difficulty urinating.  You feel nauseous or you vomit repeatedly during a period of more than 2 days after the procedure. Get help right away if:  Your urine is dark red or has blood clots in it.  You are leaking urine (have incontinence).  The end of the stent comes out of your urethra.  You cannot urinate.  You have sudden, sharp, or severe pain in your abdomen or lower back.  You have a fever. This information is not intended to replace advice given to you by your health care provider. Make sure you discuss any questions you have with your health care provider. Document Released: 08/05/2013 Document Revised: 05/10/2016 Document Reviewed: 06/17/2015 Elsevier Interactive Patient Education  2018 [place] Anesthesia, Adult General anesthesia is the use of medicines to make a person "go to sleep" (be unconscious) for a medical procedure. General anesthesia is often recommended when a procedure:  Is long.  Requires you to be still or in an unusual position.  Is major and can cause you to lose blood.  Is impossible to do without general anesthesia.  The medicines used for general anesthesia are called general anesthetics.  In addition to making you sleep, the medicines:  Prevent pain.  Control your blood pressure.  Relax your muscles.  Tell a health care provider about:  Any allergies you have.  All medicines you are taking, including vitamins, herbs, eye drops, creams, and over-the-counter medicines.  Any problems you or family members have had with anesthetic medicines.  Types of anesthetics you have had in the past.  Any bleeding disorders you have.  Any surgeries you have had.  Any medical conditions you have.  Any history of heart or lung conditions, such as heart failure, sleep apnea, or chronic obstructive pulmonary disease (COPD).  Whether you are pregnant or may be pregnant.  Whether you use tobacco, alcohol, marijuana, or street drugs.  Any history of Armed forces logistics/support/administrative officer.  Any history of depression or anxiety. What are the risks? Generally, this is a safe procedure. However, problems may occur, including:  Allergic reaction to anesthetics.  Lung and heart problems.  Inhaling food or liquids from your stomach into your lungs (aspiration).  Injury to nerves.  Waking up during your procedure and being unable to move (rare).  Extreme agitation or a state of mental confusion (delirium) when you wake up from the anesthetic.  Air in the bloodstream, which can lead to stroke.  These problems are more likely to develop if you are having a major surgery or if you have an advanced medical condition. You can prevent some of these complications by answering all of your health care provider's questions thoroughly and by following all pre-procedure instructions. General anesthesia can cause side effects, including:  Nausea or vomiting  A sore throat from the breathing tube.  Feeling cold or shivery.  Feeling tired, washed out, or achy.  Sleepiness or drowsiness.  Confusion or agitation.  What happens before the procedure? Staying hydrated  Follow instructions from your health  care provider about hydration, which may include:  Up to 2 hours before the procedure - you may continue to drink clear liquids, such as water, clear fruit juice, black coffee, and plain tea.  Eating and drinking restrictions Follow instructions from your health care provider about eating and drinking, which may include:  8 hours before the procedure - stop eating heavy meals or foods such as meat, fried foods, or fatty foods.  6 hours before the procedure - stop eating light meals or foods, such as toast or cereal.  6 hours before the procedure - stop drinking milk or drinks that contain milk.  2 hours before the procedure - stop drinking clear liquids.  Medicines  Ask your health care provider about: ? Changing or stopping your regular medicines. This is especially important if you are taking diabetes medicines or blood thinners. ? Taking medicines such as aspirin and ibuprofen. These medicines can thin your blood. Do not take these medicines before your procedure if your health care provider instructs you not to. ? Taking new dietary supplements or medicines. Do not take these during the week before your procedure unless your health care provider approves them.  If you are told to take a medicine or to continue taking a medicine on the day of the procedure, take the medicine with sips of water. General instructions   Ask if you will be going home the same day, the following day, or after a longer hospital stay. ? Plan to have someone take you home. ? Plan to have someone stay with you for the first 24 hours after you leave the hospital or clinic.  For 3-6 weeks before the procedure, try not to use any tobacco products, such as cigarettes, chewing tobacco, and e-cigarettes.  You may brush your teeth on the morning of the procedure, but make sure to spit out the toothpaste. What happens during the procedure?  You will be given anesthetics through a mask and through an IV tube in  one of your veins.  You may receive medicine to help you relax (sedative).  As soon as you are asleep, a breathing tube may be used to help you breathe.  An anesthesia specialist will stay with you throughout the procedure. He or she will help keep you comfortable and safe by continuing to give you medicines and adjusting the amount of medicine that you get. He or she will also watch your blood pressure, pulse, and oxygen levels to make sure that the anesthetics do not cause any problems.  If a breathing tube was used to help you breathe, it will be removed before you wake up. The procedure may vary among health care providers and hospitals. What happens after the procedure?  You will wake up, often slowly, after the procedure is complete, usually in a recovery area.  Your blood pressure, heart rate, breathing rate, and blood oxygen level will be monitored until the medicines you were given have worn off.  You may be given medicine to help you calm down if you feel anxious or agitated.  If you will be going home the same day, your health care provider may check to make sure you can stand, drink, and urinate.  Your health care providers will treat your pain and side effects before you go home.  Do not drive for 24 hours if you received a sedative.  You may: ? Feel nauseous and vomit. ? Have a sore throat. ? Have  mental slowness. ? Feel cold or shivery. ? Feel sleepy. ? Feel tired. ? Feel sore or achy, even in parts of your body where you did not have surgery. This information is not intended to replace advice given to you by your health care provider. Make sure you discuss any questions you have with your health care provider. Document Released: 03/11/2008 Document Revised: 05/15/2016 Document Reviewed: 11/17/2015 Elsevier Interactive Patient Education  2018 [place] Anesthesia, Adult, Care After These instructions provide you with information about caring for  yourself after your procedure. Your health care provider may also give you more specific instructions. Your treatment has been planned according to current medical practices, but problems sometimes occur. Call your health care provider if you have any problems or questions after your procedure. What can I expect after the procedure? After the procedure, it is common to have:  Vomiting.  A sore throat.  Mental slowness.  It is common to feel:  Nauseous.  Cold or shivery.  Sleepy.  Tired.  Sore or achy, even in parts of your body where you did not have surgery.  Follow these instructions at home: For at least 24 hours after the procedure:  Do not: ? Participate in activities where you could fall or become injured. ? Drive. ? Use heavy machinery. ? Drink alcohol. ? Take sleeping pills or medicines that cause drowsiness. ? Make important decisions or sign legal documents. ? Take care of children on your own.  Rest. Eating and drinking  If you vomit, drink water, juice, or soup when you can drink without vomiting.  Drink enough fluid to keep your urine clear or pale yellow.  Make sure you have little or no nausea before eating solid foods.  Follow the diet recommended by your health care provider. General instructions  Have a responsible adult stay with you until you are awake and alert.  Return to your normal activities as told by your health care provider. Ask your health care provider what activities are safe for you.  Take over-the-counter and prescription medicines only as told by your health care provider.  If you smoke, do not smoke without supervision.  Keep all follow-up visits as told by your health care provider. This is important. Contact a health care provider if:  You continue to have nausea or vomiting at home, and medicines are not helpful.  You cannot drink fluids or start eating again.  You cannot urinate after 8-12 hours.  You develop a  skin rash.  You have fever.  You have increasing redness at the site of your procedure. Get help right away if:  You have difficulty breathing.  You have chest pain.  You have unexpected bleeding.  You feel that you are having a life-threatening or urgent problem. This information is not intended to replace advice given to you by your health care provider. Make sure you discuss any questions you have with your health care provider. Document Released: 03/11/2001 Document Revised: 05/07/2016 Document Reviewed: 11/17/2015 Elsevier Interactive Patient Education  [employer].

## 2018-04-25 ENCOUNTER — Encounter (HOSPITAL_COMMUNITY)
Admission: RE | Admit: 2018-04-25 | Discharge: 2018-04-25 | Disposition: A | Discharge status: Home / Self Care | Payer: Medicare Other | Source: Ambulatory Visit | Attending: Urology | Admitting: Urology

## 2018-04-25 ENCOUNTER — Other Ambulatory Visit: Payer: Self-pay

## 2018-04-25 ENCOUNTER — Encounter (HOSPITAL_COMMUNITY): Payer: Self-pay

## 2018-04-25 DIAGNOSIS — Z01810 Encounter for preprocedural cardiovascular examination: Secondary | ICD-10-CM | POA: Insufficient documentation

## 2018-04-25 DIAGNOSIS — Z01812 Encounter for preprocedural laboratory examination: Secondary | ICD-10-CM | POA: Insufficient documentation

## 2018-04-25 DIAGNOSIS — R9431 Abnormal electrocardiogram [ECG] [EKG]: Secondary | ICD-10-CM | POA: Insufficient documentation

## 2018-04-25 LAB — CBC WITH DIFFERENTIAL/PLATELET
Basophils Absolute: 0 10*3/uL (ref 0.0–0.1)
Basophils Relative: 0 %
EOS ABS: 0.1 10*3/uL (ref 0.0–0.7)
Eosinophils Relative: 2 %
HEMATOCRIT: 36.1 % (ref 36.0–46.0)
HEMOGLOBIN: 11.3 g/dL — AB (ref 12.0–15.0)
LYMPHS PCT: 10 %
Lymphs Abs: 0.6 10*3/uL — ABNORMAL LOW (ref 0.7–4.0)
MCH: 30.7 pg (ref 26.0–34.0)
MCHC: 31.3 g/dL (ref 30.0–36.0)
MCV: 98.1 fL (ref 78.0–100.0)
MONOS PCT: 9 %
Monocytes Absolute: 0.6 10*3/uL (ref 0.1–1.0)
NEUTROS ABS: 5 10*3/uL (ref 1.7–7.7)
NEUTROS PCT: 79 %
PLATELETS: 307 10*3/uL (ref 150–400)
RBC: 3.68 MIL/uL — AB (ref 3.87–5.11)
RDW: 14.2 % (ref 11.5–15.5)
WBC: 6.3 10*3/uL (ref 4.0–10.5)

## 2018-04-25 LAB — BASIC METABOLIC PANEL
ANION GAP: 7 (ref 5–15)
BUN: 21 mg/dL — ABNORMAL HIGH (ref 6–20)
CO2: 30 mmol/L (ref 22–32)
Calcium: 10.7 mg/dL — ABNORMAL HIGH (ref 8.9–10.3)
Chloride: 100 mmol/L — ABNORMAL LOW (ref 101–111)
Creatinine, Ser: 0.95 mg/dL (ref 0.44–1.00)
GFR, EST AFRICAN AMERICAN: 58 mL/min — AB (ref >60)
GFR, EST NON AFRICAN AMERICAN: 50 mL/min — AB (ref >60)
Glucose, Bld: 128 mg/dL — ABNORMAL HIGH (ref 65–99)
POTASSIUM: 4.1 mmol/L (ref 3.5–5.1)
SODIUM: 137 mmol/L (ref 135–145)

## 2018-04-30 ENCOUNTER — Encounter (HOSPITAL_COMMUNITY)
Admission: RE | Disposition: A | Discharge status: Home / Self Care | Payer: Self-pay | Source: Ambulatory Visit | Attending: Urology

## 2018-04-30 ENCOUNTER — Ambulatory Visit (HOSPITAL_COMMUNITY)
Admission: RE | Admit: 2018-04-30 | Discharge: 2018-04-30 | Disposition: A | Discharge status: Home / Self Care | Payer: Medicare Other | Source: Ambulatory Visit | Attending: Urology | Admitting: Urology

## 2018-04-30 ENCOUNTER — Ambulatory Visit (HOSPITAL_COMMUNITY): Payer: Medicare Other | Admitting: Anesthesiology

## 2018-04-30 ENCOUNTER — Encounter (HOSPITAL_COMMUNITY): Payer: Self-pay

## 2018-04-30 ENCOUNTER — Ambulatory Visit (HOSPITAL_COMMUNITY): Payer: Medicare Other

## 2018-04-30 DIAGNOSIS — T8389XA Other specified complication of genitourinary prosthetic devices, implants and grafts, initial encounter: Secondary | ICD-10-CM | POA: Insufficient documentation

## 2018-04-30 DIAGNOSIS — K219 Gastro-esophageal reflux disease without esophagitis: Secondary | ICD-10-CM | POA: Insufficient documentation

## 2018-04-30 DIAGNOSIS — Z885 Allergy status to narcotic agent status: Secondary | ICD-10-CM | POA: Diagnosis not present

## 2018-04-30 DIAGNOSIS — I2510 Atherosclerotic heart disease of native coronary artery without angina pectoris: Secondary | ICD-10-CM | POA: Diagnosis not present

## 2018-04-30 DIAGNOSIS — I10 Essential (primary) hypertension: Secondary | ICD-10-CM | POA: Diagnosis not present

## 2018-04-30 DIAGNOSIS — M419 Scoliosis, unspecified: Secondary | ICD-10-CM | POA: Diagnosis not present

## 2018-04-30 DIAGNOSIS — N3592 Unspecified urethral stricture, female: Secondary | ICD-10-CM | POA: Diagnosis not present

## 2018-04-30 DIAGNOSIS — E785 Hyperlipidemia, unspecified: Secondary | ICD-10-CM | POA: Diagnosis not present

## 2018-04-30 DIAGNOSIS — Y831 Surgical operation with implant of artificial internal device as the cause of abnormal reaction of the patient, or of later complication, without mention of misadventure at the time of the procedure: Secondary | ICD-10-CM | POA: Insufficient documentation

## 2018-04-30 DIAGNOSIS — Z85048 Personal history of other malignant neoplasm of rectum, rectosigmoid junction, and anus: Secondary | ICD-10-CM | POA: Diagnosis not present

## 2018-04-30 DIAGNOSIS — N201 Calculus of ureter: Secondary | ICD-10-CM | POA: Diagnosis not present

## 2018-04-30 DIAGNOSIS — J449 Chronic obstructive pulmonary disease, unspecified: Secondary | ICD-10-CM | POA: Diagnosis not present

## 2018-04-30 DIAGNOSIS — Z882 Allergy status to sulfonamides status: Secondary | ICD-10-CM | POA: Insufficient documentation

## 2018-04-30 DIAGNOSIS — N135 Crossing vessel and stricture of ureter without hydronephrosis: Secondary | ICD-10-CM

## 2018-04-30 DIAGNOSIS — E039 Hypothyroidism, unspecified: Secondary | ICD-10-CM | POA: Diagnosis not present

## 2018-04-30 DIAGNOSIS — Z87891 Personal history of nicotine dependence: Secondary | ICD-10-CM | POA: Insufficient documentation

## 2018-04-30 DIAGNOSIS — Z87442 Personal history of urinary calculi: Secondary | ICD-10-CM | POA: Insufficient documentation

## 2018-04-30 DIAGNOSIS — Z79899 Other long term (current) drug therapy: Secondary | ICD-10-CM | POA: Insufficient documentation

## 2018-04-30 DIAGNOSIS — N1330 Unspecified hydronephrosis: Secondary | ICD-10-CM | POA: Diagnosis not present

## 2018-04-30 DIAGNOSIS — N210 Calculus in bladder: Secondary | ICD-10-CM | POA: Diagnosis not present

## 2018-04-30 HISTORY — PX: CYSTOSCOPY W/ URETERAL STENT PLACEMENT: SHX1429

## 2018-04-30 HISTORY — PX: HOLMIUM LASER APPLICATION: SHX5852

## 2018-04-30 SURGERY — CYSTOSCOPY, WITH RETROGRADE PYELOGRAM AND URETERAL STENT INSERTION
Anesthesia: General | Laterality: Left

## 2018-04-30 MED ORDER — HYDROCODONE-ACETAMINOPHEN 5-325 MG PO TABS: 1.0000 | ORAL_TABLET | ORAL | 0 refills | Status: DC | PRN

## 2018-04-30 MED ORDER — HYDROMORPHONE HCL 1 MG/ML IJ SOLN: 0.2500 mg | INTRAMUSCULAR | Status: DC | PRN

## 2018-04-30 MED ORDER — ONDANSETRON HCL 4 MG/2ML IJ SOLN: 4.0000 mg | Freq: Once | INTRAMUSCULAR | Status: DC | PRN

## 2018-04-30 MED ORDER — DIATRIZOATE MEGLUMINE 30 % UR SOLN
URETHRAL | Status: AC
  Filled 2018-04-30: qty 100

## 2018-04-30 MED ORDER — EPHEDRINE SULFATE 50 MG/ML IJ SOLN
INTRAMUSCULAR | Status: DC | PRN
  Administered 2018-04-30: 5 mg via INTRAVENOUS

## 2018-04-30 MED ORDER — DIATRIZOATE MEGLUMINE 30 % UR SOLN
URETHRAL | Status: DC | PRN
  Administered 2018-04-30: 10 mL

## 2018-04-30 MED ORDER — CEFAZOLIN SODIUM-DEXTROSE 2-4 GM/100ML-% IV SOLN
2.0000 g | INTRAVENOUS | Status: AC
  Administered 2018-04-30: 2 g via INTRAVENOUS
  Filled 2018-04-30: qty 100

## 2018-04-30 MED ORDER — LIDOCAINE HCL (CARDIAC) PF 100 MG/5ML IV SOSY
PREFILLED_SYRINGE | INTRAVENOUS | Status: DC | PRN
  Administered 2018-04-30: 25 mg via INTRAVENOUS

## 2018-04-30 MED ORDER — HYDROCODONE-ACETAMINOPHEN 7.5-325 MG PO TABS: 1.0000 | ORAL_TABLET | Freq: Once | ORAL | Status: DC | PRN

## 2018-04-30 MED ORDER — SUCCINYLCHOLINE CHLORIDE 20 MG/ML IJ SOLN
INTRAMUSCULAR | Status: DC | PRN
  Administered 2018-04-30: 80 mg via INTRAVENOUS

## 2018-04-30 MED ORDER — FENTANYL CITRATE (PF) 100 MCG/2ML IJ SOLN
INTRAMUSCULAR | Status: DC | PRN
  Administered 2018-04-30: 25 ug via INTRAVENOUS

## 2018-04-30 MED ORDER — PROPOFOL 10 MG/ML IV BOLUS
INTRAVENOUS | Status: DC | PRN
  Administered 2018-04-30: 50 mg via INTRAVENOUS

## 2018-04-30 MED ORDER — ONDANSETRON HCL 4 MG/2ML IJ SOLN
INTRAMUSCULAR | Status: AC
  Filled 2018-04-30: qty 2

## 2018-04-30 MED ORDER — PHENYLEPHRINE HCL 10 MG/ML IJ SOLN
INTRAMUSCULAR | Status: DC | PRN
  Administered 2018-04-30 (×2): 40 ug via INTRAVENOUS

## 2018-04-30 MED ORDER — LACTATED RINGERS IV SOLN
INTRAVENOUS | Status: DC
  Administered 2018-04-30: 11:00:00 via INTRAVENOUS

## 2018-04-30 MED ORDER — STERILE WATER FOR IRRIGATION IR SOLN
Status: DC | PRN
  Administered 2018-04-30: 1000 mL

## 2018-04-30 MED ORDER — MEPERIDINE HCL 50 MG/ML IJ SOLN: 6.2500 mg | INTRAMUSCULAR | Status: DC | PRN

## 2018-04-30 MED ORDER — SODIUM CHLORIDE 0.9 % IR SOLN
Status: DC | PRN
  Administered 2018-04-30 (×2): 3000 mL via INTRAVESICAL

## 2018-04-30 SURGICAL SUPPLY — 21 items
BAG DRAIN URO TABLE W/ADPT NS (DRAPE) ×3 IMPLANT
CATH INTERMIT  6FR 70CM (CATHETERS) ×3 IMPLANT
CLOTH BEACON ORANGE TIMEOUT ST (SAFETY) ×3 IMPLANT
DECANTER SPIKE VIAL GLASS SM (MISCELLANEOUS) ×3 IMPLANT
GLOVE BIO SURGEON STRL SZ8 (GLOVE) ×3 IMPLANT
GLOVE BIOGEL PI IND STRL 7.0 (GLOVE) ×2 IMPLANT
GLOVE BIOGEL PI INDICATOR 7.0 (GLOVE) ×4
GLOVE ECLIPSE 6.5 STRL STRAW (GLOVE) ×3 IMPLANT
GOWN STRL REUS W/ TWL XL LVL3 (GOWN DISPOSABLE) ×1 IMPLANT
GOWN STRL REUS W/TWL LRG LVL3 (GOWN DISPOSABLE) ×3 IMPLANT
GOWN STRL REUS W/TWL XL LVL3 (GOWN DISPOSABLE) ×2
GUIDEWIRE STR ZIPWIRE 035X150 (MISCELLANEOUS) ×3 IMPLANT
IV NS IRRIG 3000ML ARTHROMATIC (IV SOLUTION) ×6 IMPLANT
KIT TURNOVER CYSTO (KITS) ×3 IMPLANT
LASER FIBER DISP 1000U (UROLOGICAL SUPPLIES) ×3 IMPLANT
MANIFOLD NEPTUNE II (INSTRUMENTS) ×3 IMPLANT
PACK CYSTO (CUSTOM PROCEDURE TRAY) ×3 IMPLANT
PAD ARMBOARD 7.5X6 YLW CONV (MISCELLANEOUS) ×3 IMPLANT
STENT CONTOUR 7FRX24 (STENTS) ×3 IMPLANT
SYRINGE 10CC LL (SYRINGE) ×3 IMPLANT
TOWEL OR 17X26 4PK STRL BLUE (TOWEL DISPOSABLE) ×3 IMPLANT

## 2018-04-30 NOTE — Anesthesia Preprocedure Evaluation (Signed)
Anesthesia Evaluation  Patient identified by MRN, date of birth, ID band Patient awake    Reviewed: Allergy & Precautions, H&P , NPO status , Patient's Chart, lab work & pertinent test results, reviewed documented beta blocker date and time   Airway Mallampati: II  TM Distance: >3 FB Neck ROM: full    Dental  (+) Edentulous Upper, Edentulous Lower   Pulmonary neg pulmonary ROS, shortness of breath, COPD, former smoker,    Pulmonary exam normal breath sounds clear to auscultation       Cardiovascular Exercise Tolerance: Good hypertension, + CAD  negative cardio ROS   Rhythm:regular Rate:Normal     Neuro/Psych  Headaches, negative neurological ROS  negative psych ROS   GI/Hepatic negative GI ROS, Neg liver ROS, GERD  ,  Endo/Other  negative endocrine ROSHypothyroidism   Renal/GU negative Renal ROS  negative genitourinary   Musculoskeletal   Abdominal   Peds  Hematology negative hematology ROS (+) anemia ,   Anesthesia Other Findings Rectal adenocarcinoma   Reproductive/Obstetrics negative OB ROS                             Anesthesia Physical  Anesthesia Plan  ASA: III  Anesthesia Plan: General   Post-op Pain Management:    Induction:   PONV Risk Score and Plan:   Airway Management Planned:   Additional Equipment:   Intra-op Plan:   Post-operative Plan:   Informed Consent: I have reviewed the patients History and Physical, chart, labs and discussed the procedure including the risks, benefits and alternatives for the proposed anesthesia with the patient or authorized representative who has indicated his/her understanding and acceptance.   Dental Advisory Given  Plan Discussed with: CRNA  Anesthesia Plan Comments:         Anesthesia Quick Evaluation  

## 2018-04-30 NOTE — Op Note (Signed)
Preoperative diagnosis: Left encrusted ureteral stent  Postoperative diagnosis: Same  Procedure: 1 cystoscopy 2. Left retrograde pyelography 3.  Intraoperative fluoroscopy, under one hour, with interpretation 4.  cystolithalopaxy for a stone greater then 2.5cm 5.  Left 7 x 24 JJ stent exchange   Attending: Rosie Fate  Anesthesia: General  Estimated blood loss: None  Drains: Left 7 x 24 JJ ureteral stent without tether  Specimens: none  Antibiotics: ancef  Findings: 3cmcalculus growing on the proximal and distal curl of the ureteral stent. Mild left hydronephrosis. No masses/lesions in the bladder. Ureteral orifices in normal anatomic location.  Indications: Patient is a 84 year old female with a history of left ureteral stricture managed with an indwelling stent.  After discussing treatment options, she decided proceed with left stent exchange.  Procedure her in detail: The patient was brought to the operating room and a brief timeout was done to ensure correct patient, correct procedure, correct site.  General anesthesia was administered patient was placed in dorsal lithotomy position.  Her genitalia was then prepped and draped in usual sterile fashion.  A rigid 37 French cystoscope was passed in the urethra and the bladder.  Bladder was inspected free masses or lesions.  the ureteral orifices were in the normal orthotopic locations. Using a 1000nm laser fiber the stone attached to the distal curl was fragmented and the fragments were removed from the bladder. We then used a grasper to bring the stent to the urethral meatus. a 6 french ureteral catheter was then instilled into the left ureteral orifice.  a gentle retrograde was obtained and findings noted above.  we then placed a zip wire through the ureteral catheter and advanced up to the renal pelvis. We then removed the stent. Once all stone fragments were removed we then placed a 7 x 24 double-j ureteral stent over  the original zip wire. We then removed the wire and good coil was noted in the the renal pelvis under fluoroscopy and the bladder under direct vision.  the bladder was then drained and this concluded the procedure which was well tolerated by patient.  Complications: None  Condition: Stable, extubated, transferred to PACU  Plan: Patient is to be discharged home as to follow-up in 1 month for a renal US

## 2018-04-30 NOTE — Discharge Instructions (Signed)

## 2018-04-30 NOTE — H&P (Signed)
Urology Admission H&P  Chief Complaint: left flank pain  History of Present Illness: Sherry [name] is a 84yo with left upper tract TCC and ureteral stricture here for stent exchanged. # weeks ago she developed worsening bladder spasms and pelvic pain. She previously had a large stone on her stent. She has severe urgency, frequency, and dysuria  Past Medical History:  Diagnosis Date  . Anemia   . Arthritis   . Bronchitis   . CAD (coronary artery disease)   . COPD (chronic obstructive pulmonary disease) ([place])   . Emphysema of lung ([place])   . GERD (gastroesophageal reflux disease)   . Headache(784.0)   . History of blood transfusion   . History of kidney stones   . Hyperlipidemia   . Hypertension   . Hypothyroidism   . Osteoporosis   . Rectal adenocarcinoma ([place]) dx'd 08/2009  . Rectal cancer ([place])   . Scoliosis    per Dr Melony Overly ortho  . Shingles   . Shortness of breath   . Tendon adhesions    torn tendon right shoulder   Past Surgical History:  Procedure Laterality Date  . APPENDECTOMY    . BALLOON DILATION Left 04/10/2017   Procedure: BALLOON DILATION OF LEFT URETERAL STRICTURE;  Surgeon: Cleon Gustin, MD;  Location: [facility];  Service: Urology;  Laterality: Left;  . CARDIAC CATHETERIZATION    . CHOLECYSTECTOMY    . COLON REMOVAL     8 " 12/2009 DR TSUEI  . COLON SURGERY     apr for rectal cancer 2011  . COLONOSCOPY    . COLOSTOMY    . CYSTOSCOPY W/ URETERAL STENT PLACEMENT Left 03/18/2017   Procedure: CYSTOSCOPY WITH RETROGRADE PYELOGRAM/URETERAL STENT PLACEMENT AND URETERAL DIAGNOSTIC LEFT;  Surgeon: Cleon Gustin, MD;  Location: [facility];  Service: Urology;  Laterality: Left;  1 HR [phone] [id number] A [phone] - pt to arrive at 7:00 to receive blood  . CYSTOSCOPY W/ URETERAL STENT PLACEMENT Left 04/10/2017   Procedure: CYSTOSCOPY WITH STENT REPLACEMENT;  Surgeon: Cleon Gustin, MD;  Location: [facility];  Service: Urology;  Laterality: Left;   . CYSTOSCOPY W/ URETERAL STENT PLACEMENT Left 06/03/2017   Procedure: CYSTOSCOPY WITH LEFT RETROGRADE PYELOGRAM/LEFT URETERAL  BALLOON DILATION AND LEFT URETERAL STENT PLACEMENT;  Surgeon: Cleon Gustin, MD;  Location: [facility];  Service: Urology;  Laterality: Left;  . CYSTOSCOPY W/ URETERAL STENT PLACEMENT Left 09/16/2017   Procedure: CYSTOSCOPY WITH LEFT STENT EXCHANGE/ RETROGRADE;  Surgeon: Cleon Gustin, MD;  Location: [facility];  Service: Urology;  Laterality: Left;  NEEDS TOTAL 30 MIN  . CYSTOSCOPY W/ URETERAL STENT PLACEMENT Left 02/12/2018   Procedure: CYSTOSCOPY WITH RETROGRADE PYELOGRAM/URETERAL STENT EXCHANGE/HOLMIUM LASER;  Surgeon: Cleon Gustin, MD;  Location: [facility];  Service: Urology;  Laterality: Left;  . CYSTOSCOPY WITH RETROGRADE PYELOGRAM, URETEROSCOPY AND STENT PLACEMENT Left 03/29/2016   Procedure: CYSTOSCOPY WITH RETROGRADE PYELOGRAM, URETEROSCOPY AND STENT PLACEMENT;  Surgeon: Kathie Rhodes, MD;  Location: [facility];  Service: Urology;  Laterality: Left;  With Laser  . CYSTOSCOPY/RETROGRADE/URETEROSCOPY Left 01/28/2017   Procedure: CYSTOSCOPY/RETROGRADE/URETEROSCOPY (FLEX 6 fr) THULIUM LASER OF TUMOR;  Surgeon: Franchot Gallo, MD;  Location: [facility];  Service: Urology;  Laterality: Left;  . CYSTOSCOPY/RETROGRADE/URETEROSCOPY Left 04/10/2017   Procedure: CYSTOSCOPY, LEFT RETROGRADE PYELOGRAM LEFT URETEROSCOPY;  Surgeon: Cleon Gustin, MD;  Location: [facility];  Service: Urology;  Laterality: Left;  . EYE SURGERY     Cataract with lens- bil  . HOLMIUM LASER APPLICATION Left 0/34/7425  Procedure: HOLMIUM LASER ABLATION LEFT URETERAL TUMOR;  Surgeon: Cleon Gustin, MD;  Location: [facility];  Service: Urology;  Laterality: Left;  . OOPHORECTOMY    . ovary tumor removal     after son was born  . PARASTOMAL HERNIA REPAIR  10/30/2012  . POLYPECTOMY    . RECTUM REMOVAL     1/11 DR TSUEI  . TRANSURETHRAL RESECTION OF BLADDER TUMOR N/A 10/21/2017   Procedure:  TRANSURETHRAL RESECTION OF BLADDER TUMOR (TURBT);  Surgeon: Cleon Gustin, MD;  Location: [facility];  Service: Urology;  Laterality: N/A;  . TRANSURETHRAL RESECTION OF BLADDER TUMOR N/A 02/12/2018   Procedure: TRANSURETHRAL RESECTION OF BLADDER TUMOR (TURBT);  Surgeon: Cleon Gustin, MD;  Location: [facility];  Service: Urology;  Laterality: N/A;  . TUBAL LIGATION    . VAGINAL HYSTERECTOMY    . VENTRAL HERNIA REPAIR  10/30/2012   Procedure: LAPAROSCOPIC VENTRAL HERNIA;  Surgeon: Imogene Burn. Georgette Dover, MD;  Location: [facility];  Service: General;  Laterality: N/A;  Laparoscopic repair of parastomal hernia    Home Medications:  Current Facility-Administered Medications  Medication Dose Route Frequency Provider Last Rate Last Dose  . ceFAZolin (ANCEF) IVPB 2g/100 mL premix  2 g Intravenous 30 min Pre-Op Cleon Gustin, MD      . lactated ringers infusion   Intravenous Continuous Nicanor Alcon, MD 50 mL/hr at 04/30/18 1100    . sterile water for irrigation for irrigation    PRN Alyson Ingles Candee Furbish, MD   1,000 mL at 04/30/18 1208   Allergies:  Allergies  Allergen Reactions  . Percocet [Oxycodone-Acetaminophen] Other (See Comments)    Upsets stomach- does tolerate   . Influenza Virus Vacc Split Pf Other (See Comments)    Unknown  But had to be hospitalized -1966  . Sulfamethoxazole-Trimethoprim Nausea And Vomiting  . Hibiclens [Chlorhexidine Gluconate] Rash    Family History  Problem Relation Age of Onset  . Heart failure Sister   . Cervical cancer Sister   . Heart failure Brother   . Colon cancer Brother   . Colon cancer Brother   . Heart failure Father   . Cancer Mother        HEAD AND NECK  . Cervical cancer Daughter   . Kidney cancer Daughter    Social History:  reports that she quit smoking about 14 years ago. Her smoking use included cigarettes. She has a 97.50 pack-year smoking history. She has never used smokeless tobacco. She reports that she does not drink alcohol or use  drugs.  Review of Systems  Genitourinary: Positive for dysuria, frequency and urgency.  All other systems reviewed and are negative.   Physical Exam:  Vital signs in last 24 hours: Temp:  [98.5 F (36.9 C)] 98.5 F (36.9 C) (05/15 1032) Pulse Rate:  [74] 74 (05/15 1032) BP: (156)/(96) 156/96 (05/15 1032) SpO2:  [98 %] 98 % (05/15 1032) Weight:  [50.8 kg (112 lb)] 50.8 kg (112 lb) (05/15 1032) Physical Exam  Constitutional: She is oriented to person, place, and time. She appears well-developed and well-nourished.  HENT:  Head: Normocephalic and atraumatic.  Eyes: Pupils are equal, round, and reactive to light. EOM are normal.  Neck: Normal range of motion. No thyromegaly present.  Cardiovascular: Normal rate and regular rhythm.  Respiratory: Effort normal. No respiratory distress.  GI: Soft. She exhibits no distension.  Musculoskeletal: Normal range of motion. She exhibits no edema.  Neurological: She is alert and oriented to person, place,  and time.  Skin: Skin is warm and dry.  Psychiatric: She has a normal mood and affect. Her behavior is normal. Judgment and thought content normal.    Laboratory Data:  No results found for this or any previous visit (from the past 24 hour(s)). No results found for this or any previous visit (from the past 240 hour(s)). Creatinine: Recent Labs    04/25/18 1000  CREATININE 0.95   Baseline Creatinine: 0.95  Impression/Assessment:  84yo with left ureteral stricture  Plan:  The risks/benefits/alternatives to left ureteral stent exchange was explained to the patient and she understands and wishes to proceed with surgery  [name] 04/30/2018, 12:23 PM

## 2018-04-30 NOTE — Transfer of Care (Signed)
Immediate Anesthesia Transfer of Care Note  Patient: Sherry [name]  Procedure(s) Performed: CYSTOSCOPY WITH LEFT RETROGRADE PYELOGRAM/LEFT URETERAL STENT EXCHANGE (Left ) HOLMIUM LASER LITHOTRIPSY BLADDER CALCULUS (LEFT ENCRUSTED URETERAL STENT) (Left )  Patient Location: PACU  Anesthesia Type:General  Level of Consciousness: awake, oriented and patient cooperative  Airway & Oxygen Therapy: Patient Spontanous Breathing  Post-op Assessment: Report given to RN and Post -op Vital signs reviewed and stable  Post vital signs: Reviewed and stable  Last Vitals:  Vitals Value Taken Time  BP    Temp    Pulse 79 04/30/2018  1:39 PM  Resp 24 04/30/2018  1:39 PM  SpO2 100 % 04/30/2018  1:39 PM  Vitals shown include unvalidated device data.  Last Pain:  Vitals:   04/30/18 1032  TempSrc: Oral  PainSc: 2          Complications: No apparent anesthesia complications

## 2018-04-30 NOTE — Anesthesia Postprocedure Evaluation (Signed)
Anesthesia Post Note  Patient: Sherry [name]  Procedure(s) Performed: CYSTOSCOPY WITH LEFT RETROGRADE PYELOGRAM/LEFT URETERAL STENT EXCHANGE (Left ) HOLMIUM LASER LITHOTRIPSY BLADDER CALCULUS (LEFT ENCRUSTED URETERAL STENT) (Left )  Patient location during evaluation: PACU Anesthesia Type: General Level of consciousness: awake and alert, oriented and patient cooperative Pain management: pain level controlled Vital Signs Assessment: post-procedure vital signs reviewed and stable Respiratory status: spontaneous breathing Cardiovascular status: stable Postop Assessment: no apparent nausea or vomiting Anesthetic complications: no     Last Vitals:  Vitals:   04/30/18 1354 04/30/18 1407  BP:  (!) 147/77  Pulse: 73 76  Resp: 18 16  Temp:  36.6 C  SpO2: 100% 96%    Last Pain:  Vitals:   04/30/18 1407  TempSrc: Oral  PainSc: 0-No pain                 ADAMS, AMY A

## 2018-04-30 NOTE — Anesthesia Procedure Notes (Signed)
Procedure Name: Intubation Date/Time: 04/30/2018 12:41 PM Performed by: Andree Elk, Daril Warga A, CRNA Pre-anesthesia Checklist: Patient identified, Patient being monitored, Timeout performed, Emergency Drugs available and Suction available Patient Re-evaluated:Patient Re-evaluated prior to induction Oxygen Delivery Method: Circle System Utilized Preoxygenation: Pre-oxygenation with 100% oxygen Induction Type: IV induction Ventilation: Mask ventilation without difficulty Laryngoscope Size: Mac and 3 Grade View: Grade I Tube type: Oral Tube size: 7.0 mm Number of attempts: 1 Airway Equipment and Method: Stylet Placement Confirmation: ETT inserted through vocal cords under direct vision,  positive ETCO2 and breath sounds checked- equal and bilateral Secured at: 21 cm Tube secured with: Tape Dental Injury: Teeth and Oropharynx as per pre-operative assessment

## 2018-05-01 ENCOUNTER — Encounter (HOSPITAL_COMMUNITY): Payer: Self-pay | Admitting: Urology

## 2018-05-01 DIAGNOSIS — M25511 Pain in right shoulder: Secondary | ICD-10-CM | POA: Diagnosis not present

## 2018-05-05 LAB — STONE ANALYSIS
CA PHOS CRY STONE QL IR: 65 %
MAGNESIUM AMMON PHOS: 35 %
Stone Weight KSTONE: 954.9 mg

## 2018-06-04 ENCOUNTER — Ambulatory Visit (INDEPENDENT_AMBULATORY_CARE_PROVIDER_SITE_OTHER): Payer: Medicare Other | Admitting: Urology

## 2018-06-04 DIAGNOSIS — C678 Malignant neoplasm of overlapping sites of bladder: Secondary | ICD-10-CM | POA: Diagnosis not present

## 2018-06-04 DIAGNOSIS — N131 Hydronephrosis with ureteral stricture, not elsewhere classified: Secondary | ICD-10-CM | POA: Diagnosis not present

## 2018-06-06 ENCOUNTER — Other Ambulatory Visit: Payer: Self-pay | Admitting: Urology

## 2018-07-04 ENCOUNTER — Encounter (HOSPITAL_COMMUNITY)
Admission: RE | Admit: 2018-07-04 | Discharge: 2018-07-04 | Disposition: A | Discharge status: Home / Self Care | Payer: Medicare Other | Source: Ambulatory Visit | Attending: Urology | Admitting: Urology

## 2018-07-07 ENCOUNTER — Encounter (HOSPITAL_COMMUNITY): Payer: Self-pay

## 2018-07-08 ENCOUNTER — Encounter: Payer: Self-pay | Admitting: Internal Medicine

## 2018-07-08 ENCOUNTER — Ambulatory Visit (INDEPENDENT_AMBULATORY_CARE_PROVIDER_SITE_OTHER): Payer: Medicare Other | Admitting: Internal Medicine

## 2018-07-08 VITALS — BP 134/84 | HR 101 | Temp 97.9°F | Wt 103.0 lb

## 2018-07-08 DIAGNOSIS — C20 Malignant neoplasm of rectum: Secondary | ICD-10-CM | POA: Diagnosis not present

## 2018-07-08 DIAGNOSIS — D500 Iron deficiency anemia secondary to blood loss (chronic): Secondary | ICD-10-CM

## 2018-07-08 DIAGNOSIS — I10 Essential (primary) hypertension: Secondary | ICD-10-CM

## 2018-07-08 DIAGNOSIS — E039 Hypothyroidism, unspecified: Secondary | ICD-10-CM | POA: Diagnosis not present

## 2018-07-08 LAB — COMPREHENSIVE METABOLIC PANEL
ALT: 6 U/L (ref 0–35)
AST: 7 U/L (ref 0–37)
Albumin: 3.7 g/dL (ref 3.5–5.2)
Alkaline Phosphatase: 86 U/L (ref 39–117)
BUN: 18 mg/dL (ref 6–23)
CO2: 30 mEq/L (ref 19–32)
CREATININE: 1 mg/dL (ref 0.40–1.20)
Calcium: 10 mg/dL (ref 8.4–10.5)
Chloride: 100 mEq/L (ref 96–112)
GFR: 55.02 mL/min — AB (ref >60.00)
Glucose, Bld: 121 mg/dL — ABNORMAL HIGH (ref 70–99)
Potassium: 4.1 mEq/L (ref 3.5–5.1)
Sodium: 136 mEq/L (ref 135–145)
Total Bilirubin: 0.5 mg/dL (ref 0.2–1.2)
Total Protein: 6.6 g/dL (ref 6.0–8.3)

## 2018-07-08 LAB — CBC WITH DIFFERENTIAL/PLATELET
Basophils Absolute: 0 10*3/uL (ref 0.0–0.1)
Basophils Relative: 0.6 % (ref 0.0–3.0)
Eosinophils Absolute: 0 10*3/uL (ref 0.0–0.7)
Eosinophils Relative: 0.4 % (ref 0.0–5.0)
HEMATOCRIT: 37.4 % (ref 36.0–46.0)
Hemoglobin: 12.1 g/dL (ref 12.0–15.0)
Lymphocytes Relative: 6.6 % — ABNORMAL LOW (ref 12.0–46.0)
Lymphs Abs: 0.4 10*3/uL — ABNORMAL LOW (ref 0.7–4.0)
MCHC: 32.4 g/dL (ref 30.0–36.0)
MCV: 90.5 fl (ref 78.0–100.0)
MONOS PCT: 9.1 % (ref 3.0–12.0)
Monocytes Absolute: 0.6 10*3/uL (ref 0.1–1.0)
Neutro Abs: 5.3 10*3/uL (ref 1.4–7.7)
Neutrophils Relative %: 83.3 % — ABNORMAL HIGH (ref 43.0–77.0)
Platelets: 271 10*3/uL (ref 150.0–400.0)
RBC: 4.13 Mil/uL (ref 3.87–5.11)
RDW: 17.3 % — AB (ref 11.5–15.5)
WBC: 6.3 10*3/uL (ref 4.0–10.5)

## 2018-07-08 LAB — TSH: TSH: 9.6 u[IU]/mL — AB (ref 0.35–4.50)

## 2018-07-08 NOTE — Patient Instructions (Signed)
Neurology follow-up as scheduled  Report any new or worsening symptoms  Recheck in 6 weeks or as needed

## 2018-07-08 NOTE — Progress Notes (Signed)
Subjective:    Patient ID: Sherry [name], female    DOB: 1923-05-01, 84 y.o.   MRN: [id number]  HPI  Wt Readings from Last 3 Encounters:  07/08/18 103 lb (46.7 kg)  04/30/18 112 lb (50.8 kg)  04/25/18 112 lb (86.91 kg)   84 year old patient who is seen today for follow-up. She has a history of hypertension and remote history of rectal cancer.  She is status post colostomy.  She has been followed closely by urology with recurrent bladder stones.  She is scheduled for follow-up cystoscopy tomorrow. Over the past 2 months there is been a 9 pound weight loss.  Her daughter describes some increasing confusion and poor appetite. She has treated hypothyroidism.  Denies any abdominal pain nausea vomiting.  No issues with colostomy.  Past Medical History:  Diagnosis Date  . Anemia   . Arthritis   . Bronchitis   . CAD (coronary artery disease)   . COPD (chronic obstructive pulmonary disease) ([place])   . Emphysema of lung ([place])   . GERD (gastroesophageal reflux disease)   . Headache(784.0)   . History of blood transfusion   . History of kidney stones   . Hyperlipidemia   . Hypertension   . Hypothyroidism   . Osteoporosis   . Rectal adenocarcinoma ([place]) dx'd 08/2009  . Rectal cancer ([place])   . Scoliosis    per Dr Melony Overly ortho  . Shingles   . Shortness of breath   . Tendon adhesions    torn tendon right shoulder     Social History   Socioeconomic History  . Marital status: Married    Spouse name: Not on file  . Number of children: 3  . Years of education: Not on file  . Highest education level: Not on file  Occupational History  . Not on file  Social Needs  . Financial resource strain: Not on file  . Food insecurity:    Worry: Not on file    Inability: Not on file  . Transportation needs:    Medical: Not on file    Non-medical: Not on file  Tobacco Use  . Smoking status: Former Smoker    Packs/day: 1.50    Years: 65.00    Pack years: 97.50    Types: Cigarettes     Last attempt to quit: 12/18/2003    Years since quitting: 14.5  . Smokeless tobacco: Never Used  . Tobacco comment: quit 2005  Substance and Sexual Activity  . Alcohol use: No    Alcohol/week: 0.0 oz  . Drug use: No  . Sexual activity: Never    Birth control/protection: None  Lifestyle  . Physical activity:    Days per week: Not on file    Minutes per session: Not on file  . Stress: Not on file  Relationships  . Social connections:    Talks on phone: Not on file    Gets together: Not on file    Attends religious service: Not on file    Active member of club or organization: Not on file    Attends meetings of clubs or organizations: Not on file    Relationship status: Not on file  . Intimate partner violence:    Fear of current or ex partner: Not on file    Emotionally abused: Not on file    Physically abused: Not on file    Forced sexual activity: Not on file  Other Topics Concern  . Not on file  Social  History Narrative  . Not on file    Past Surgical History:  Procedure Laterality Date  . APPENDECTOMY    . BALLOON DILATION Left 04/10/2017   Procedure: BALLOON DILATION OF LEFT URETERAL STRICTURE;  Surgeon: Cleon Gustin, MD;  [facility];  Service: Urology;  Laterality: Left;  . CARDIAC CATHETERIZATION    . CHOLECYSTECTOMY    . COLON REMOVAL     8 " 12/2009 DR TSUEI  . COLON SURGERY     apr for rectal cancer 2011  . COLONOSCOPY    . COLOSTOMY    . CYSTOSCOPY W/ URETERAL STENT PLACEMENT Left 03/18/2017   Procedure: CYSTOSCOPY WITH RETROGRADE PYELOGRAM/URETERAL STENT PLACEMENT AND URETERAL DIAGNOSTIC LEFT;  Surgeon: Cleon Gustin, MD;  [facility]: [facility];  Service: Urology;  Laterality: Left;  1 HR [phone] MEDICARE-[id number] A [phone] - pt to arrive at 7:00 to receive blood  . CYSTOSCOPY W/ URETERAL STENT PLACEMENT Left 04/10/2017   Procedure: CYSTOSCOPY WITH STENT REPLACEMENT;  Surgeon: Cleon Gustin, MD;  [facility];  Service:  Urology;  Laterality: Left;  . CYSTOSCOPY W/ URETERAL STENT PLACEMENT Left 06/03/2017   Procedure: CYSTOSCOPY WITH LEFT RETROGRADE PYELOGRAM/LEFT URETERAL  BALLOON DILATION AND LEFT URETERAL STENT PLACEMENT;  Surgeon: Cleon Gustin, MD;  [facility];  Service: Urology;  Laterality: Left;  . CYSTOSCOPY W/ URETERAL STENT PLACEMENT Left 09/16/2017   Procedure: CYSTOSCOPY WITH LEFT STENT EXCHANGE/ RETROGRADE;  Surgeon: Cleon Gustin, MD;  [facility];  Service: Urology;  Laterality: Left;  NEEDS TOTAL 30 MIN  . CYSTOSCOPY W/ URETERAL STENT PLACEMENT Left 02/12/2018   Procedure: CYSTOSCOPY WITH RETROGRADE PYELOGRAM/URETERAL STENT EXCHANGE/HOLMIUM LASER;  Surgeon: Cleon Gustin, MD;  [facility];  Service: Urology;  Laterality: Left;  . CYSTOSCOPY W/ URETERAL STENT PLACEMENT Left 04/30/2018   Procedure: CYSTOSCOPY WITH LEFT RETROGRADE PYELOGRAM/LEFT URETERAL STENT EXCHANGE;  Surgeon: Cleon Gustin, MD;  [facility];  Service: Urology;  Laterality: Left;  . CYSTOSCOPY WITH RETROGRADE PYELOGRAM, URETEROSCOPY AND STENT PLACEMENT Left 03/29/2016   Procedure: CYSTOSCOPY WITH RETROGRADE PYELOGRAM, URETEROSCOPY AND STENT PLACEMENT;  Surgeon: Kathie Rhodes, MD;  [facility];  Service: Urology;  Laterality: Left;  With Laser  . CYSTOSCOPY/RETROGRADE/URETEROSCOPY Left 01/28/2017   Procedure: CYSTOSCOPY/RETROGRADE/URETEROSCOPY (FLEX 6 fr) THULIUM LASER OF TUMOR;  Surgeon: Franchot Gallo, MD;  [facility];  Service: Urology;  Laterality: Left;  . CYSTOSCOPY/RETROGRADE/URETEROSCOPY Left 04/10/2017   Procedure: CYSTOSCOPY, LEFT RETROGRADE PYELOGRAM LEFT URETEROSCOPY;  Surgeon: Cleon Gustin, MD;  [facility];  Service: Urology;  Laterality: Left;  . EYE SURGERY     Cataract with lens- bil  . HOLMIUM LASER APPLICATION Left 2/87/6811   Procedure: HOLMIUM LASER ABLATION LEFT URETERAL TUMOR;  Surgeon: Cleon Gustin, MD;  [facility];  Service: Urology;   Laterality: Left;  . HOLMIUM LASER APPLICATION Left 5/72/6203   Procedure: HOLMIUM LASER LITHOTRIPSY BLADDER CALCULUS (LEFT ENCRUSTED URETERAL STENT);  Surgeon: Cleon Gustin, MD;  [facility];  Service: Urology;  Laterality: Left;  . OOPHORECTOMY    . ovary tumor removal     after son was born  . PARASTOMAL HERNIA REPAIR  10/30/2012  . POLYPECTOMY    . RECTUM REMOVAL     1/11 DR TSUEI  . TRANSURETHRAL RESECTION OF BLADDER TUMOR N/A 10/21/2017   Procedure: TRANSURETHRAL RESECTION OF BLADDER TUMOR (TURBT);  Surgeon: Cleon Gustin, MD;  [facility];  Service: Urology;  Laterality: N/A;  . TRANSURETHRAL RESECTION OF  BLADDER TUMOR N/A 02/12/2018   Procedure: TRANSURETHRAL RESECTION OF BLADDER TUMOR (TURBT);  Surgeon: Cleon Gustin, MD;  [facility];  Service: Urology;  Laterality: N/A;  . TUBAL LIGATION    . VAGINAL HYSTERECTOMY    . VENTRAL HERNIA REPAIR  10/30/2012   Procedure: LAPAROSCOPIC VENTRAL HERNIA;  Surgeon: Imogene Burn. Georgette Dover, MD;  Location: [facility];  Service: General;  Laterality: N/A;  Laparoscopic repair of parastomal hernia    Family History  Problem Relation Age of Onset  . Heart failure Sister   . Cervical cancer Sister   . Heart failure Brother   . Colon cancer Brother   . Colon cancer Brother   . Heart failure Father   . Cancer Mother        HEAD AND NECK  . Cervical cancer Daughter   . Kidney cancer Daughter     Allergies  Allergen Reactions  . Percocet [Oxycodone-Acetaminophen] Other (See Comments)    Upsets stomach- does tolerate   . Influenza Virus Vacc Split Pf Other (See Comments)    Unknown  But had to be hospitalized -1966  . Sulfamethoxazole-Trimethoprim Nausea And Vomiting  . Hibiclens [Chlorhexidine Gluconate] Rash    Current Outpatient Medications on File Prior to Visit  Medication Sig Dispense Refill  . acetaminophen (TYLENOL) 650 MG CR tablet Take 1,300 mg by mouth daily as needed for pain.    Marland Kitchen atorvastatin  (LIPITOR) 10 MG tablet TAKE 1 TABLET DAILY (Patient taking differently: TAKE 1 TABLET DAILY IN THE EVENING.) 90 tablet 2  . diltiazem (TIAZAC) 180 MG 24 hr capsule TAKE 1 CAPSULE DAILY 90 capsule 3  . levothyroxine (SYNTHROID, LEVOTHROID) 75 MCG tablet Take 1 tablet (75 mcg total) by mouth daily. 30 tablet 0  . MYRBETRIQ 50 MG TB24 tablet Take 50 mg by mouth daily as needed (bladder spasms).   3  . PROAIR HFA 108 (90 Base) MCG/ACT inhaler USE 2 INHALATIONS INTO THE LUNGS EVERY 4 HOURS AS NEEDED FOR SHORTNESS OF BREATH 25.5 g 5  . TOPROL XL 25 MG 24 hr tablet TAKE ONE-HALF (1/2) TABLET DAILY (Patient taking differently: Take 12.5 mg by mouth every evening. TAKE ONE-HALF (1/2) TABLET DAILY) 90 tablet 1  . traMADol-acetaminophen (ULTRACET) 37.5-325 MG tablet Take 1 tablet by mouth every 6 (six) hours as needed for moderate pain. 30 tablet 0   No current facility-administered medications on file prior to visit.     BP 134/84 (BP Location: Right Arm, Patient Position: Sitting, Cuff Size: Normal)   Pulse (!) 101   Temp 97.9 F (36.6 C) (Oral)   Wt 103 lb (46.7 kg)   SpO2 95%   BMI 18.25 kg/m     Review of Systems  Constitutional: Positive for activity change, appetite change and unexpected weight change.  HENT: Negative for congestion, dental problem, hearing loss, rhinorrhea, sinus pressure, sore throat and tinnitus.   Eyes: Negative for pain, discharge and visual disturbance.  Respiratory: Negative for cough and shortness of breath.   Cardiovascular: Negative for chest pain, palpitations and leg swelling.  Gastrointestinal: Negative for abdominal distention, abdominal pain, blood in stool, constipation, diarrhea, nausea and vomiting.  Genitourinary: Negative for difficulty urinating, dysuria, flank pain, frequency, hematuria, pelvic pain, urgency, vaginal bleeding, vaginal discharge and vaginal pain.  Musculoskeletal: Negative for arthralgias, gait problem and joint swelling.  Skin:  Negative for rash.  Neurological: Negative for dizziness, syncope, speech difficulty, weakness, numbness and headaches.  Hematological: Negative for adenopathy.  Psychiatric/Behavioral: Negative for agitation,  behavioral problems and dysphoric mood. The patient is not nervous/anxious.        Objective:   Physical Exam  Constitutional: She is oriented to person, place, and time. She appears well-developed and well-nourished. No distress.  Alert and appropriate Wheelchair-bound No distress Frail Afebrile Weight 103  HENT:  Head: Normocephalic.  Right Ear: External ear normal.  Left Ear: External ear normal.  Mouth/Throat: Oropharynx is clear and moist.  Eyes: Pupils are equal, round, and reactive to light. Conjunctivae and EOM are normal.  Neck: Normal range of motion. Neck supple. No thyromegaly present.  Cardiovascular: Normal rate, regular rhythm, normal heart sounds and intact distal pulses.  Rate 90  Pulmonary/Chest: Effort normal and breath sounds normal.  Abdominal: Soft. Bowel sounds are normal. She exhibits no mass. There is no tenderness.  Colostomy noted  Musculoskeletal: Normal range of motion.  Lymphadenopathy:    She has no cervical adenopathy.  Neurological: She is alert and oriented to person, place, and time.  Skin: Skin is warm and dry. No rash noted.  Psychiatric: She has a normal mood and affect. Her behavior is normal.          Assessment & Plan:  Weight loss.  Will check updated lab including TSH.  Concerned about depression Confusion.  Will observe check screening lab.  Patient appears to be alert and appropriate Recurrent bladder stones.  Urology follow-up Hypothyroidism.  Check TSH History of rectal cancer  Urology follow-up as scheduled tomorrow Recheck 6 weeks High caloric diet recommended  Marletta Lor

## 2018-07-09 ENCOUNTER — Ambulatory Visit (HOSPITAL_COMMUNITY): Payer: Medicare Other | Admitting: Anesthesiology

## 2018-07-09 ENCOUNTER — Ambulatory Visit (HOSPITAL_COMMUNITY)
Admission: RE | Admit: 2018-07-09 | Discharge: 2018-07-09 | Disposition: A | Discharge status: Home / Self Care | Payer: Medicare Other | Source: Ambulatory Visit | Attending: Urology | Admitting: Urology

## 2018-07-09 ENCOUNTER — Encounter (HOSPITAL_COMMUNITY)
Admission: RE | Disposition: A | Discharge status: Home / Self Care | Payer: Self-pay | Source: Ambulatory Visit | Attending: Urology

## 2018-07-09 ENCOUNTER — Ambulatory Visit (HOSPITAL_COMMUNITY): Payer: Medicare Other

## 2018-07-09 ENCOUNTER — Telehealth: Payer: Self-pay | Admitting: Internal Medicine

## 2018-07-09 DIAGNOSIS — N135 Crossing vessel and stricture of ureter without hydronephrosis: Secondary | ICD-10-CM | POA: Diagnosis not present

## 2018-07-09 DIAGNOSIS — I10 Essential (primary) hypertension: Secondary | ICD-10-CM | POA: Diagnosis not present

## 2018-07-09 DIAGNOSIS — Z8551 Personal history of malignant neoplasm of bladder: Secondary | ICD-10-CM | POA: Insufficient documentation

## 2018-07-09 DIAGNOSIS — Z87891 Personal history of nicotine dependence: Secondary | ICD-10-CM | POA: Insufficient documentation

## 2018-07-09 DIAGNOSIS — N210 Calculus in bladder: Secondary | ICD-10-CM | POA: Insufficient documentation

## 2018-07-09 DIAGNOSIS — E039 Hypothyroidism, unspecified: Secondary | ICD-10-CM | POA: Insufficient documentation

## 2018-07-09 DIAGNOSIS — Z85048 Personal history of other malignant neoplasm of rectum, rectosigmoid junction, and anus: Secondary | ICD-10-CM | POA: Insufficient documentation

## 2018-07-09 DIAGNOSIS — Z79899 Other long term (current) drug therapy: Secondary | ICD-10-CM | POA: Insufficient documentation

## 2018-07-09 DIAGNOSIS — N131 Hydronephrosis with ureteral stricture, not elsewhere classified: Secondary | ICD-10-CM | POA: Diagnosis not present

## 2018-07-09 DIAGNOSIS — Z87442 Personal history of urinary calculi: Secondary | ICD-10-CM | POA: Diagnosis not present

## 2018-07-09 DIAGNOSIS — J449 Chronic obstructive pulmonary disease, unspecified: Secondary | ICD-10-CM | POA: Insufficient documentation

## 2018-07-09 DIAGNOSIS — E785 Hyperlipidemia, unspecified: Secondary | ICD-10-CM | POA: Insufficient documentation

## 2018-07-09 DIAGNOSIS — K219 Gastro-esophageal reflux disease without esophagitis: Secondary | ICD-10-CM | POA: Insufficient documentation

## 2018-07-09 DIAGNOSIS — I2510 Atherosclerotic heart disease of native coronary artery without angina pectoris: Secondary | ICD-10-CM | POA: Diagnosis not present

## 2018-07-09 DIAGNOSIS — N209 Urinary calculus, unspecified: Secondary | ICD-10-CM | POA: Diagnosis not present

## 2018-07-09 HISTORY — PX: CYSTOSCOPY W/ URETERAL STENT PLACEMENT: SHX1429

## 2018-07-09 HISTORY — PX: CYSTOSCOPY WITH HOLMIUM LASER LITHOTRIPSY: SHX6639

## 2018-07-09 SURGERY — CYSTOSCOPY, WITH RETROGRADE PYELOGRAM AND URETERAL STENT INSERTION
Anesthesia: General | Laterality: Left

## 2018-07-09 MED ORDER — PHENYLEPHRINE HCL 10 MG/ML IJ SOLN
INTRAMUSCULAR | Status: DC | PRN
  Administered 2018-07-09: 40 ug via INTRAVENOUS

## 2018-07-09 MED ORDER — ONDANSETRON HCL 4 MG/2ML IJ SOLN: 4.0000 mg | Freq: Once | INTRAMUSCULAR | Status: DC | PRN

## 2018-07-09 MED ORDER — TRAMADOL-ACETAMINOPHEN 37.5-325 MG PO TABS: 1.0000 | ORAL_TABLET | Freq: Four times a day (QID) | ORAL | 0 refills | Status: DC | PRN

## 2018-07-09 MED ORDER — FENTANYL CITRATE (PF) 100 MCG/2ML IJ SOLN
INTRAMUSCULAR | Status: AC
  Filled 2018-07-09: qty 2

## 2018-07-09 MED ORDER — LACTATED RINGERS IV SOLN
INTRAVENOUS | Status: DC | PRN
  Administered 2018-07-09: 11:00:00 via INTRAVENOUS

## 2018-07-09 MED ORDER — STERILE WATER FOR IRRIGATION IR SOLN
Status: DC | PRN
  Administered 2018-07-09: 1000 mL
  Administered 2018-07-09: 3000 mL
  Administered 2018-07-09: 500 mL

## 2018-07-09 MED ORDER — PROPOFOL 10 MG/ML IV BOLUS
INTRAVENOUS | Status: DC | PRN
  Administered 2018-07-09: 80 mg via INTRAVENOUS
  Administered 2018-07-09: 40 mg via INTRAVENOUS

## 2018-07-09 MED ORDER — DIATRIZOATE MEGLUMINE 30 % UR SOLN
URETHRAL | Status: DC | PRN
  Administered 2018-07-09: 10 mL

## 2018-07-09 MED ORDER — FENTANYL CITRATE (PF) 100 MCG/2ML IJ SOLN: 25.0000 ug | INTRAMUSCULAR | Status: DC | PRN

## 2018-07-09 MED ORDER — DIATRIZOATE MEGLUMINE 30 % UR SOLN
URETHRAL | Status: AC
Start: 2018-07-09 — End: ?
  Filled 2018-07-09: qty 100

## 2018-07-09 MED ORDER — CEFAZOLIN SODIUM-DEXTROSE 2-4 GM/100ML-% IV SOLN
2.0000 g | INTRAVENOUS | Status: AC
  Administered 2018-07-09: 2 g via INTRAVENOUS
  Filled 2018-07-09: qty 100

## 2018-07-09 MED ORDER — CEFAZOLIN SODIUM-DEXTROSE 2-4 GM/100ML-% IV SOLN
INTRAVENOUS | Status: AC
  Filled 2018-07-09: qty 100

## 2018-07-09 SURGICAL SUPPLY — 22 items
BAG DRAIN URO TABLE W/ADPT NS (BAG) ×4 IMPLANT
CATH INTERMIT  6FR 70CM (CATHETERS) ×4 IMPLANT
CLOTH BEACON ORANGE TIMEOUT ST (SAFETY) ×4 IMPLANT
DECANTER SPIKE VIAL GLASS SM (MISCELLANEOUS) ×4 IMPLANT
GLOVE BIO SURGEON STRL SZ8 (GLOVE) ×4 IMPLANT
GLOVE BIOGEL M 7.0 STRL (GLOVE) ×4 IMPLANT
GOWN STRL REUS W/ TWL XL LVL3 (GOWN DISPOSABLE) ×2 IMPLANT
GOWN STRL REUS W/TWL LRG LVL3 (GOWN DISPOSABLE) ×4 IMPLANT
GOWN STRL REUS W/TWL XL LVL3 (GOWN DISPOSABLE) ×2
GUIDEWIRE STR DUAL SENSOR (WIRE) ×4 IMPLANT
GUIDEWIRE STR ZIPWIRE 035X150 (MISCELLANEOUS) ×4 IMPLANT
IV NS IRRIG 3000ML ARTHROMATIC (IV SOLUTION) ×4 IMPLANT
KIT TURNOVER CYSTO (KITS) ×4 IMPLANT
LASER FIBER DISP 1000U (UROLOGICAL SUPPLIES) ×4 IMPLANT
MANIFOLD NEPTUNE II (INSTRUMENTS) ×4 IMPLANT
PACK CYSTO (CUSTOM PROCEDURE TRAY) ×4 IMPLANT
PAD ARMBOARD 7.5X6 YLW CONV (MISCELLANEOUS) ×4 IMPLANT
STENT CONTOUR 7FRX24 (STENTS) ×4 IMPLANT
SYR 10ML LL (SYRINGE) ×4 IMPLANT
TOWEL OR 17X26 4PK STRL BLUE (TOWEL DISPOSABLE) ×4 IMPLANT
WATER STERILE IRR 1000ML UROMA (IV SOLUTION) ×4 IMPLANT
WATER STERILE IRR 500ML POUR (IV SOLUTION) ×4 IMPLANT

## 2018-07-09 NOTE — Anesthesia Procedure Notes (Signed)
Procedure Name: LMA Insertion Date/Time: 07/09/2018 12:21 PM Performed by: Vista Deck, CRNA Pre-anesthesia Checklist: Patient identified, Patient being monitored, Emergency Drugs available, Timeout performed and Suction available Patient Re-evaluated:Patient Re-evaluated prior to induction Oxygen Delivery Method: Circle System Utilized Preoxygenation: Pre-oxygenation with 100% oxygen Induction Type: IV induction Ventilation: Mask ventilation without difficulty LMA: LMA inserted LMA Size: 3.0 Number of attempts: 1 Placement Confirmation: positive ETCO2 and breath sounds checked- equal and bilateral Tube secured with: Tape Dental Injury: Teeth and Oropharynx as per pre-operative assessment

## 2018-07-09 NOTE — H&P (Signed)
Urology Admission H&P  Chief Complaint: left flank pain  History of Present Illness: Sherry [name] is a 84yo with left ureteral stricture, bladder cancer, and left renal pelvis tumor who presents for stent exchange. Yesterday she developed severe bladder spasms. No other LUTS. No hematuria  Past Medical History:  Diagnosis Date  . Anemia   . Arthritis   . Bronchitis   . CAD (coronary artery disease)   . COPD (chronic obstructive pulmonary disease) ([place])   . Emphysema of lung ([employer])   . GERD (gastroesophageal reflux disease)   . Headache(784.0)   . History of blood transfusion   . History of kidney stones   . Hyperlipidemia   . Hypertension   . Hypothyroidism   . Osteoporosis   . Rectal adenocarcinoma ([place]) dx'd 08/2009  . Rectal cancer ([place])   . Scoliosis    per Dr Melony Overly ortho  . Shingles   . Shortness of breath   . Tendon adhesions    torn tendon right shoulder   Past Surgical History:  Procedure Laterality Date  . APPENDECTOMY    . BALLOON DILATION Left 04/10/2017   Procedure: BALLOON DILATION OF LEFT URETERAL STRICTURE;  Surgeon: Cleon Gustin, MD;  [facility];  Service: Urology;  Laterality: Left;  . CARDIAC CATHETERIZATION    . CHOLECYSTECTOMY    . COLON REMOVAL     8 " 12/2009 DR TSUEI  . COLON SURGERY     apr for rectal cancer 2011  . COLONOSCOPY    . COLOSTOMY    . CYSTOSCOPY W/ URETERAL STENT PLACEMENT Left 03/18/2017   Procedure: CYSTOSCOPY WITH RETROGRADE PYELOGRAM/URETERAL STENT PLACEMENT AND URETERAL DIAGNOSTIC LEFT;  Surgeon: Cleon Gustin, MD;  [facility];  Service: Urology;  Laterality: Left;  1 HR [phone] [id number] A [phone] - pt to arrive at 7:00 to receive blood  . CYSTOSCOPY W/ URETERAL STENT PLACEMENT Left 04/10/2017   Procedure: CYSTOSCOPY WITH STENT REPLACEMENT;  Surgeon: Cleon Gustin, MD;  [facility];  Service: Urology;  Laterality: Left;  . CYSTOSCOPY W/ URETERAL STENT PLACEMENT Left  06/03/2017   Procedure: CYSTOSCOPY WITH LEFT RETROGRADE PYELOGRAM/LEFT URETERAL  BALLOON DILATION AND LEFT URETERAL STENT PLACEMENT;  Surgeon: Cleon Gustin, MD;  [facility];  Service: Urology;  Laterality: Left;  . CYSTOSCOPY W/ URETERAL STENT PLACEMENT Left 09/16/2017   Procedure: CYSTOSCOPY WITH LEFT STENT EXCHANGE/ RETROGRADE;  Surgeon: Cleon Gustin, MD;  [facility];  Service: Urology;  Laterality: Left;  NEEDS TOTAL 30 MIN  . CYSTOSCOPY W/ URETERAL STENT PLACEMENT Left 02/12/2018   Procedure: CYSTOSCOPY WITH RETROGRADE PYELOGRAM/URETERAL STENT EXCHANGE/HOLMIUM LASER;  Surgeon: Cleon Gustin, MD;  [facility];  Service: Urology;  Laterality: Left;  . CYSTOSCOPY W/ URETERAL STENT PLACEMENT Left 04/30/2018   Procedure: CYSTOSCOPY WITH LEFT RETROGRADE PYELOGRAM/LEFT URETERAL STENT EXCHANGE;  Surgeon: Cleon Gustin, MD;  [facility];  Service: Urology;  Laterality: Left;  . CYSTOSCOPY WITH RETROGRADE PYELOGRAM, URETEROSCOPY AND STENT PLACEMENT Left 03/29/2016   Procedure: CYSTOSCOPY WITH RETROGRADE PYELOGRAM, URETEROSCOPY AND STENT PLACEMENT;  Surgeon: Kathie Rhodes, MD;  [facility];  Service: Urology;  Laterality: Left;  With Laser  . CYSTOSCOPY/RETROGRADE/URETEROSCOPY Left 01/28/2017   Procedure: CYSTOSCOPY/RETROGRADE/URETEROSCOPY (FLEX 6 fr) THULIUM LASER OF TUMOR;  Surgeon: Franchot Gallo, MD;  [facility];  Service: Urology;  Laterality: Left;  . CYSTOSCOPY/RETROGRADE/URETEROSCOPY Left 04/10/2017   Procedure: CYSTOSCOPY, LEFT RETROGRADE PYELOGRAM LEFT URETEROSCOPY;  Surgeon: Cleon Gustin, MD;  [facility];  Service: Urology;  Laterality: Left;  . EYE SURGERY     Cataract with lens- bil  . HOLMIUM LASER APPLICATION Left 4/54/0981   Procedure: HOLMIUM LASER ABLATION LEFT URETERAL TUMOR;  Surgeon: Cleon Gustin, MD;  [facility];  Service: Urology;  Laterality: Left;  . HOLMIUM LASER APPLICATION Left 1/91/4782   Procedure:  HOLMIUM LASER LITHOTRIPSY BLADDER CALCULUS (LEFT ENCRUSTED URETERAL STENT);  Surgeon: Cleon Gustin, MD;  [facility];  Service: Urology;  Laterality: Left;  . OOPHORECTOMY    . ovary tumor removal     after son was born  . PARASTOMAL HERNIA REPAIR  10/30/2012  . POLYPECTOMY    . RECTUM REMOVAL     1/11 DR TSUEI  . TRANSURETHRAL RESECTION OF BLADDER TUMOR N/A 10/21/2017   Procedure: TRANSURETHRAL RESECTION OF BLADDER TUMOR (TURBT);  Surgeon: Cleon Gustin, MD;  [facility];  Service: Urology;  Laterality: N/A;  . TRANSURETHRAL RESECTION OF BLADDER TUMOR N/A 02/12/2018   Procedure: TRANSURETHRAL RESECTION OF BLADDER TUMOR (TURBT);  Surgeon: Cleon Gustin, MD;  [facility];  Service: Urology;  Laterality: N/A;  . TUBAL LIGATION    . VAGINAL HYSTERECTOMY    . VENTRAL HERNIA REPAIR  10/30/2012   Procedure: LAPAROSCOPIC VENTRAL HERNIA;  Surgeon: Imogene Burn. Georgette Dover, MD;  Location: [facility];  Service: General;  Laterality: N/A;  Laparoscopic repair of parastomal hernia    Home Medications:  Current Facility-Administered Medications  Medication Dose Route Frequency Provider Last Rate Last Dose  . ceFAZolin (ANCEF) IVPB 2g/100 mL premix  2 g Intravenous 30 min Pre-Op Omer Puccinelli, Candee Furbish, MD       Allergies:  Allergies  Allergen Reactions  . Percocet [Oxycodone-Acetaminophen] Other (See Comments)    Upsets stomach- does tolerate   . Influenza Virus Vacc Split Pf Other (See Comments)    Unknown  But had to be hospitalized -1966  . Sulfamethoxazole-Trimethoprim Nausea And Vomiting  . Hibiclens [Chlorhexidine Gluconate] Rash    Family History  Problem Relation Age of Onset  . Heart failure Sister   . Cervical cancer Sister   . Heart failure Brother   . Colon cancer Brother   . Colon cancer Brother   . Heart failure Father   . Cancer Mother        HEAD AND NECK  . Cervical cancer Daughter   . Kidney cancer Daughter    Social History:  reports that she quit  smoking about 14 years ago. Her smoking use included cigarettes. She has a 97.50 pack-year smoking history. She has never used smokeless tobacco. She reports that she does not drink alcohol or use drugs.  Review of Systems  All other systems reviewed and are negative.   Physical Exam:  Vital signs in last 24 hours: Temp:  [97.8 F (36.6 C)-97.9 F (36.6 C)] 97.8 F (36.6 C) (07/24 1059) Pulse Rate:  [85-101] 85 (07/24 1059) Resp:  [22] 22 (07/24 1059) BP: (134)/(84) 134/84 (07/23 1410) SpO2:  [95 %-97 %] 97 % (07/24 1059) Weight:  [46.7 kg (103 lb)] 46.7 kg (103 lb) (07/23 1410) Physical Exam  Constitutional: She is oriented to person, place, and time. She appears well-developed and well-nourished.  HENT:  Head: Normocephalic and atraumatic.  Eyes: Pupils are equal, round, and reactive to light. EOM are normal.  Neck: Normal range of motion. No thyromegaly present.  Cardiovascular: Normal rate and regular rhythm.  Respiratory: Effort normal. No respiratory distress.  GI: Soft. She exhibits no distension.  Musculoskeletal: Normal range of motion. She exhibits no edema.  Neurological: She is alert and oriented to person, place, and time.  Skin: Skin is warm and dry.  Psychiatric: She has a normal mood and affect. Her behavior is normal. Judgment and thought content normal.    Laboratory Data:  Results for orders placed or performed in visit on 07/08/18 (from the past 24 hour(s))  TSH     Status: Abnormal   Collection Time: 07/08/18  2:56 PM  Result Value Ref Range   TSH 9.60 (H) 0.35 - 4.50 uIU/mL  Comprehensive metabolic panel     Status: Abnormal   Collection Time: 07/08/18  2:56 PM  Result Value Ref Range   Sodium 136 135 - 145 mEq/L   Potassium 4.1 3.5 - 5.1 mEq/L   Chloride 100 96 - 112 mEq/L   CO2 30 19 - 32 mEq/L   Glucose, Bld 121 (H) 70 - 99 mg/dL   BUN 18 6 - 23 mg/dL   Creatinine, Ser 1.00 0.40 - 1.20 mg/dL   Total Bilirubin 0.5 0.2 - 1.2 mg/dL   Alkaline  Phosphatase 86 39 - 117 U/L   AST 7 0 - 37 U/L   ALT 6 0 - 35 U/L   Total Protein 6.6 6.0 - 8.3 g/dL   Albumin 3.7 3.5 - 5.2 g/dL   Calcium 10.0 8.4 - 10.5 mg/dL   GFR 55.02 (L) >60.00 mL/min  CBC with Differential/Platelet     Status: Abnormal   Collection Time: 07/08/18  2:56 PM  Result Value Ref Range   WBC 6.3 4.0 - 10.5 K/uL   RBC 4.13 3.87 - 5.11 Mil/uL   Hemoglobin 12.1 12.0 - 15.0 g/dL   HCT 37.4 36.0 - 46.0 %   MCV 90.5 78.0 - 100.0 fl   MCHC 32.4 30.0 - 36.0 g/dL   RDW 17.3 (H) 11.5 - 15.5 %   Platelets 271.0 150.0 - 400.0 K/uL   Neutrophils Relative % 83.3 (H) 43.0 - 77.0 %   Lymphocytes Relative 6.6 Repeated and verified X2. (L) 12.0 - 46.0 %   Monocytes Relative 9.1 3.0 - 12.0 %   Eosinophils Relative 0.4 0.0 - 5.0 %   Basophils Relative 0.6 0.0 - 3.0 %   Neutro Abs 5.3 1.4 - 7.7 K/uL   Lymphs Abs 0.4 (L) 0.7 - 4.0 K/uL   Monocytes Absolute 0.6 0.1 - 1.0 K/uL   Eosinophils Absolute 0.0 0.0 - 0.7 K/uL   Basophils Absolute 0.0 0.0 - 0.1 K/uL   No results found for this or any previous visit (from the past 240 hour(s)). Creatinine: Recent Labs    07/08/18 1456  CREATININE 1.00   Baseline Creatinine: 1  Impression/Assessment:  84yo with left ureteral stricture managed with indwelling stent  Plan:  The risks/benefits/alternatives to left stent exchange was explained to the patient and she understands and wishes to proceed with surgery  [name] 07/09/2018, 12:03 PM

## 2018-07-09 NOTE — Telephone Encounter (Signed)
Copied from [place] [id number]. Topic: Quick Communication - Lab Results >> Jul 09, 2018  9:10 AM Rebecca Eaton, CMA wrote: Called patient to inform them of 07/08/18 lab results. When patient returns call, triage nurse may disclose results. >> Jul 09, 2018  4:23 PM Antonieta Iba C wrote: Pt's daughter is returning call for results.   She would like for the nurse to call pt at: [phone]

## 2018-07-09 NOTE — Transfer of Care (Signed)
Immediate Anesthesia Transfer of Care Note  Patient: Sherry [name]  Procedure(s) Performed: CYSTOSCOPY WITH LEFT RETROGRADE PYELOGRAM/LEFT URETERAL STENT EXCHANGE (Left ) CYSTOSCOPY WITH HOLMIUM LASER LITHOTRIPSY BLADDER CALCULUS  Patient Location: PACU  Anesthesia Type:General  Level of Consciousness: awake and patient cooperative  Airway & Oxygen Therapy: Patient Spontanous Breathing and non-rebreather face mask  Post-op Assessment: Report given to RN and Post -op Vital signs reviewed and stable  Post vital signs: Reviewed and stable  Last Vitals:  Vitals Value Taken Time  BP    Temp    Pulse 53 07/09/2018  1:15 PM  Resp    SpO2 89 % 07/09/2018  1:15 PM  Vitals shown include unvalidated device data.  Last Pain:  Vitals:   07/09/18 1059  PainSc: 0-No pain         Complications: No apparent anesthesia complications

## 2018-07-09 NOTE — Discharge Instructions (Signed)

## 2018-07-09 NOTE — Anesthesia Preprocedure Evaluation (Signed)
Anesthesia Evaluation  Patient identified by MRN, date of birth, ID band Patient awake    Reviewed: Allergy & Precautions, H&P , NPO status , Patient's Chart, lab work & pertinent test results, reviewed documented beta blocker date and time   Airway Mallampati: II  TM Distance: >3 FB Neck ROM: full    Dental  (+) Edentulous Upper, Edentulous Lower   Pulmonary neg pulmonary ROS, shortness of breath, COPD, former smoker,    Pulmonary exam normal breath sounds clear to auscultation       Cardiovascular Exercise Tolerance: Good hypertension, + CAD  negative cardio ROS   Rhythm:regular Rate:Normal     Neuro/Psych  Headaches, negative neurological ROS  negative psych ROS   GI/Hepatic negative GI ROS, Neg liver ROS, GERD  ,  Endo/Other  negative endocrine ROSHypothyroidism   Renal/GU negative Renal ROS  negative genitourinary   Musculoskeletal   Abdominal   Peds  Hematology negative hematology ROS (+) anemia ,   Anesthesia Other Findings Rectal adenocarcinoma   Reproductive/Obstetrics negative OB ROS                             Anesthesia Physical  Anesthesia Plan  ASA: III  Anesthesia Plan: General   Post-op Pain Management:    Induction:   PONV Risk Score and Plan:   Airway Management Planned:   Additional Equipment:   Intra-op Plan:   Post-operative Plan:   Informed Consent: I have reviewed the patients History and Physical, chart, labs and discussed the procedure including the risks, benefits and alternatives for the proposed anesthesia with the patient or authorized representative who has indicated his/her understanding and acceptance.   Dental Advisory Given  Plan Discussed with: CRNA  Anesthesia Plan Comments:         Anesthesia Quick Evaluation

## 2018-07-09 NOTE — Op Note (Signed)
Preoperative diagnosis: Leftencrusted ureteral stent  Postoperative diagnosis: Same  Procedure: 1 cystoscopy 2. Left retrograde pyelography 3. Intraoperative fluoroscopy, under one hour, with interpretation 4. cystolithalopaxy for a stone greater then 2.5cm 5. Left 7x 24JJ stentexchange   Attending: Rosie Fate  Anesthesia: General  Estimated blood loss: None  Drains: Left7x 24JJ ureteral stent withouttether  Specimens:none  Antibiotics:ancef  Findings:4cmcalculus growing on the distal curl of the ureteral stent. no lefthydronephrosis. No masses/lesions in the bladder. Ureteral orifices in normal anatomic location.  Indications: Patient is a70 year old female with a history ofleft ureteral stricture managed with an indwelling stent.After discussing treatment options, she decided proceed with left stent exchange.  Procedure her in detail: The patient was brought to the operating room and a brief timeout was done to ensure correct patient, correct procedure, correct site. General anesthesia was administered patient was placed in dorsal lithotomy position. Her genitalia was then prepped and draped in usual sterile fashion. A rigid 74 French cystoscope was passed in the urethra and the bladder. Bladder was inspected free masses or lesions. the ureteral orifices were in the normal orthotopic locations. Using a 1000nm laser fiber the stone attached to the distal curl was fragmented and the fragments were removed from the bladder. We then used a grasper to bring the stent to the urethral meatus. a 6 french ureteral catheter was then instilled into the left ureteral orifice. a gentle retrograde was obtained and findings noted above. we then placed a zip wire through the ureteral catheter and advanced up to the renal pelvis. We then removed the stent.Once all stone fragments were removed we then placed a 7 x 24double-j ureteral stent over the original  zip wire. We then removed the wireand good coil was noted in the the renal pelvis under fluoroscopy and the bladder under direct vision. the bladder was then drained and this concluded the procedure which was well tolerated by patient.  Complications: None  Condition: Stable, extubated, transferred to PACU  Plan: Patient is to be discharged home as to follow-up in1 month for a renal US

## 2018-07-09 NOTE — Progress Notes (Signed)
Left message for patient to call back. CRM created 

## 2018-07-09 NOTE — Anesthesia Postprocedure Evaluation (Signed)
Anesthesia Post Note  Patient: Sherry [name]  Procedure(s) Performed: CYSTOSCOPY WITH LEFT RETROGRADE PYELOGRAM/LEFT URETERAL STENT EXCHANGE (Left ) CYSTOSCOPY WITH HOLMIUM LASER LITHOTRIPSY BLADDER CALCULUS  Patient location during evaluation: Short Stay Anesthesia Type: General Level of consciousness: awake and alert and patient cooperative Pain management: satisfactory to patient Vital Signs Assessment: post-procedure vital signs reviewed and stable Respiratory status: spontaneous breathing Cardiovascular status: stable Postop Assessment: no apparent nausea or vomiting Anesthetic complications: no     Last Vitals:  Vitals:   07/09/18 1336 07/09/18 1352  BP: (!) 151/82 (!) 147/86  Pulse: 79 82  Resp: 20 20  Temp:  36.7 C  SpO2: 98% 97%    Last Pain:  Vitals:   07/09/18 1352  TempSrc: Oral  PainSc: 0-No pain                 Kasim Mccorkle

## 2018-07-10 ENCOUNTER — Other Ambulatory Visit: Payer: Self-pay

## 2018-07-10 MED ORDER — LEVOTHYROXINE SODIUM 100 MCG PO TABS: 100.0000 ug | ORAL_TABLET | Freq: Every day | ORAL | 0 refills | Status: DC

## 2018-07-10 NOTE — Telephone Encounter (Signed)
Charted in result notes.  Patient's daughter wanted to make sure to add a medication to the patient's medication list.  Trospium Chloride ER 60 mg 1 tab daily for bladder spasms.

## 2018-07-10 NOTE — Telephone Encounter (Signed)
Noted  

## 2018-07-11 ENCOUNTER — Encounter (HOSPITAL_COMMUNITY): Payer: Self-pay | Admitting: Urology

## 2018-08-06 ENCOUNTER — Other Ambulatory Visit (HOSPITAL_COMMUNITY)
Admission: AD | Admit: 2018-08-06 | Discharge: 2018-08-06 | Disposition: A | Discharge status: Home / Self Care | Payer: Medicare Other | Source: Skilled Nursing Facility | Attending: Urology | Admitting: Urology

## 2018-08-06 ENCOUNTER — Ambulatory Visit (INDEPENDENT_AMBULATORY_CARE_PROVIDER_SITE_OTHER): Payer: Medicare Other | Admitting: Urology

## 2018-08-06 ENCOUNTER — Ambulatory Visit (HOSPITAL_COMMUNITY)
Admission: RE | Admit: 2018-08-06 | Discharge: 2018-08-06 | Disposition: A | Discharge status: Home / Self Care | Payer: Medicare Other | Source: Ambulatory Visit | Attending: Urology | Admitting: Urology

## 2018-08-06 ENCOUNTER — Other Ambulatory Visit: Payer: Self-pay | Admitting: Urology

## 2018-08-06 DIAGNOSIS — K435 Parastomal hernia without obstruction or  gangrene: Secondary | ICD-10-CM | POA: Diagnosis not present

## 2018-08-06 DIAGNOSIS — Z9049 Acquired absence of other specified parts of digestive tract: Secondary | ICD-10-CM | POA: Insufficient documentation

## 2018-08-06 DIAGNOSIS — N131 Hydronephrosis with ureteral stricture, not elsewhere classified: Secondary | ICD-10-CM | POA: Diagnosis not present

## 2018-08-06 DIAGNOSIS — I714 Abdominal aortic aneurysm, without rupture: Secondary | ICD-10-CM | POA: Diagnosis not present

## 2018-08-06 DIAGNOSIS — I700 Atherosclerosis of aorta: Secondary | ICD-10-CM | POA: Insufficient documentation

## 2018-08-06 DIAGNOSIS — D71 Functional disorders of polymorphonuclear neutrophils: Secondary | ICD-10-CM | POA: Diagnosis not present

## 2018-08-06 DIAGNOSIS — K753 Granulomatous hepatitis, not elsewhere classified: Secondary | ICD-10-CM | POA: Insufficient documentation

## 2018-08-06 DIAGNOSIS — N200 Calculus of kidney: Secondary | ICD-10-CM | POA: Diagnosis not present

## 2018-08-07 ENCOUNTER — Other Ambulatory Visit (HOSPITAL_COMMUNITY): Admission: RE | Admit: 2018-08-07 | Payer: Self-pay | Source: Other Acute Inpatient Hospital | Admitting: Urology

## 2018-08-08 LAB — URINE CULTURE

## 2018-08-12 ENCOUNTER — Encounter: Payer: Self-pay | Admitting: Internal Medicine

## 2018-08-12 ENCOUNTER — Ambulatory Visit (INDEPENDENT_AMBULATORY_CARE_PROVIDER_SITE_OTHER): Payer: Medicare Other | Admitting: Internal Medicine

## 2018-08-12 VITALS — BP 92/62 | HR 80 | Temp 98.5°F | Resp 14 | Wt 99.4 lb

## 2018-08-12 DIAGNOSIS — E039 Hypothyroidism, unspecified: Secondary | ICD-10-CM | POA: Diagnosis not present

## 2018-08-12 DIAGNOSIS — I10 Essential (primary) hypertension: Secondary | ICD-10-CM

## 2018-08-12 DIAGNOSIS — C20 Malignant neoplasm of rectum: Secondary | ICD-10-CM | POA: Diagnosis not present

## 2018-08-12 NOTE — Patient Instructions (Addendum)
Discontinue antibiotic therapy  Encourage liberal caloric intake  Drink as much fluid as you  can tolerate over the next few days  Discontinue diltiazem

## 2018-08-12 NOTE — Progress Notes (Signed)
Subjective:    Patient ID: Sherry [name], female    DOB: 02-04-1923, 84 y.o.   MRN: [id number]  HPI 84 year old patient who is seen today in follow-up.  She is followed closely by urology and has had a recent cystoscopy with left retrograde pyelogram.  She does have a left ureteral stent. She has had a recent abdominal CT scan 6 days ago.  She has a prior history of rectal carcinoma and history of recurrent renal calculi.  There is no evidence of hydronephrosis.  The patient was noted to have a AAA now measuring 5.2 cm which has increased in size compared to January of this year. The patient is completing antibiotic therapy which he seems to be tolerating poorly with increasing nausea and diarrhea She has a history of hypothyroidism and levothyroxine uptitrated last month.   Past Medical History:  Diagnosis Date  . Anemia   . Arthritis   . Bronchitis   . CAD (coronary artery disease)   . COPD (chronic obstructive pulmonary disease) ([place])   . Emphysema of lung ([place])   . GERD (gastroesophageal reflux disease)   . Headache(784.0)   . History of blood transfusion   . History of kidney stones   . Hyperlipidemia   . Hypertension   . Hypothyroidism   . Osteoporosis   . Rectal adenocarcinoma ([place]) dx'd 08/2009  . Rectal cancer ([place])   . Scoliosis    per Dr Melony Overly ortho  . Shingles   . Shortness of breath   . Tendon adhesions    torn tendon right shoulder     Social History   Socioeconomic History  . Marital status: Married    Spouse name: Not on file  . Number of children: 3  . Years of education: Not on file  . Highest education level: Not on file  Occupational History  . Not on file  Social Needs  . Financial resource strain: Not on file  . Food insecurity:    Worry: Not on file    Inability: Not on file  . Transportation needs:    Medical: Not on file    Non-medical: Not on file  Tobacco Use  . Smoking status: Former Smoker    Packs/day: 1.50    Years:  65.00    Pack years: 97.50    Types: Cigarettes    Last attempt to quit: 12/18/2003    Years since quitting: 14.6  . Smokeless tobacco: Never Used  . Tobacco comment: quit 2005  Substance and Sexual Activity  . Alcohol use: No    Alcohol/week: 0.0 standard drinks  . Drug use: No  . Sexual activity: Never    Birth control/protection: None  Lifestyle  . Physical activity:    Days per week: Not on file    Minutes per session: Not on file  . Stress: Not on file  Relationships  . Social connections:    Talks on phone: Not on file    Gets together: Not on file    Attends religious service: Not on file    Active member of club or organization: Not on file    Attends meetings of clubs or organizations: Not on file    Relationship status: Not on file  . Intimate partner violence:    Fear of current or ex partner: Not on file    Emotionally abused: Not on file    Physically abused: Not on file    Forced sexual activity: Not on file  Other  Topics Concern  . Not on file  Social History Narrative  . Not on file    Past Surgical History:  Procedure Laterality Date  . APPENDECTOMY    . BALLOON DILATION Left 04/10/2017   Procedure: BALLOON DILATION OF LEFT URETERAL STRICTURE;  Surgeon: Cleon Gustin, MD;  Location: [facility];  Service: Urology;  Laterality: Left;  . CARDIAC CATHETERIZATION    . CHOLECYSTECTOMY    . COLON REMOVAL     8 " 12/2009 DR TSUEI  . COLON SURGERY     ap[facility] for rectal cancer 2011  . COLONOSCOPY    . COLOSTOMY    . CYSTOSCOPY W/ URETERAL STENT PLACEMENT Left 03/18/2017   Procedure: CYSTOSCOPY WITH RETROGRADE PYELOGRAM/URETERAL STENT PLACEMENT AND URETERAL DIAGNOSTIC LEFT;  Surgeon: Cleon Gustin, MD;  Location: [facility];  Service: Urology;  Laterality: Left;  1 HR [id number] [id number] A [phone] - pt to arrive at 7:00 to receive blood  . CYSTOSCOPY W/ URETERAL STENT PLACEMENT Left 04/10/2017   Procedure: CYSTOSCOPY WITH STENT REPLACEMENT;   Surgeon: Cleon Gustin, MD;  Location: [facility];  Service: Urology;  Laterality: Left;  . CYSTOSCOPY W/ URETERAL STENT PLACEMENT Left 06/03/2017   Procedure: CYSTOSCOPY WITH LEFT RETROGRADE PYELOGRAM/LEFT URETERAL  BALLOON DILATION AND LEFT URETERAL STENT PLACEMENT;  Surgeon: Cleon Gustin, MD;  Location: [facility];  Service: Urology;  Laterality: Left;  . CYSTOSCOPY W/ URETERAL STENT PLACEMENT Left 09/16/2017   Procedure: CYSTOSCOPY WITH LEFT STENT EXCHANGE/ RETROGRADE;  Surgeon: Cleon Gustin, MD;  Location: [facility];  Service: Urology;  Laterality: Left;  NEEDS TOTAL 30 MIN  . CYSTOSCOPY W/ URETERAL STENT PLACEMENT Left 02/12/2018   Procedure: CYSTOSCOPY WITH RETROGRADE PYELOGRAM/URETERAL STENT EXCHANGE/HOLMIUM LASER;  Surgeon: Cleon Gustin, MD;  Location: [facility];  Service: Urology;  Laterality: Left;  . CYSTOSCOPY W/ URETERAL STENT PLACEMENT Left 04/30/2018   Procedure: CYSTOSCOPY WITH LEFT RETROGRADE PYELOGRAM/LEFT URETERAL STENT EXCHANGE;  Surgeon: Cleon Gustin, MD;  Location: [facility];  Service: Urology;  Laterality: Left;  . CYSTOSCOPY W/ URETERAL STENT PLACEMENT Left 07/09/2018   Procedure: CYSTOSCOPY WITH LEFT RETROGRADE PYELOGRAM/LEFT URETERAL STENT EXCHANGE;  Surgeon: Cleon Gustin, MD;  Location: [facility];  Service: Urology;  Laterality: Left;  . CYSTOSCOPY WITH HOLMIUM LASER LITHOTRIPSY  07/09/2018   Procedure: CYSTOSCOPY WITH HOLMIUM LASER LITHOTRIPSY BLADDER CALCULUS;  Surgeon: Cleon Gustin, MD;  Location: [facility];  Service: Urology;;  . Consuela Mimes WITH RETROGRADE PYELOGRAM, URETEROSCOPY AND STENT PLACEMENT Left 03/29/2016   Procedure: CYSTOSCOPY WITH RETROGRADE PYELOGRAM, URETEROSCOPY AND STENT PLACEMENT;  Surgeon: Kathie Rhodes, MD;  Location: [facility];  Service: Urology;  Laterality: Left;  With Laser  . CYSTOSCOPY/RETROGRADE/URETEROSCOPY Left 01/28/2017   Procedure: CYSTOSCOPY/RETROGRADE/URETEROSCOPY (FLEX 6 fr) THULIUM LASER OF TUMOR;  Surgeon: Franchot Gallo, MD;  Location: [facility];  Service: Urology;  Laterality: Left;  . CYSTOSCOPY/RETROGRADE/URETEROSCOPY Left 04/10/2017   Procedure: CYSTOSCOPY, LEFT RETROGRADE PYELOGRAM LEFT URETEROSCOPY;  Surgeon: Cleon Gustin, MD;  Location: [facility];  Service: Urology;  Laterality: Left;  . EYE SURGERY     Cataract with lens- bil  . HOLMIUM LASER APPLICATION Left 07/27/5725   Procedure: HOLMIUM LASER ABLATION LEFT URETERAL TUMOR;  Surgeon: Cleon Gustin, MD;  Location: [facility];  Service: Urology;  Laterality: Left;  . HOLMIUM LASER APPLICATION Left 01/20/5596   Procedure: HOLMIUM LASER LITHOTRIPSY BLADDER CALCULUS (LEFT ENCRUSTED URETERAL STENT);  Surgeon: Cleon Gustin, MD;  Location: [facility];  Service: Urology;  Laterality: Left;  . OOPHORECTOMY    .  ovary tumor removal     after son was born  . PARASTOMAL HERNIA REPAIR  10/30/2012  . POLYPECTOMY    . RECTUM REMOVAL     1/11 DR TSUEI  . TRANSURETHRAL RESECTION OF BLADDER TUMOR N/A 10/21/2017   Procedure: TRANSURETHRAL RESECTION OF BLADDER TUMOR (TURBT);  Surgeon: Cleon Gustin, MD;  Location: [facility];  Service: Urology;  Laterality: N/A;  . TRANSURETHRAL RESECTION OF BLADDER TUMOR N/A 02/12/2018   Procedure: TRANSURETHRAL RESECTION OF BLADDER TUMOR (TURBT);  Surgeon: Cleon Gustin, MD;  Location: [facility];  Service: Urology;  Laterality: N/A;  . TUBAL LIGATION    . VAGINAL HYSTERECTOMY    . VENTRAL HERNIA REPAIR  10/30/2012   Procedure: LAPAROSCOPIC VENTRAL HERNIA;  Surgeon: Imogene Burn. Georgette Dover, MD;  Location: [facility];  Service: General;  Laterality: N/A;  Laparoscopic repair of parastomal hernia    Family History  Problem Relation Age of Onset  . Heart failure Sister   . Cervical cancer Sister   . Heart failure Brother   . Colon cancer Brother   . Colon cancer Brother   . Heart failure Father   . Cancer Mother        HEAD AND NECK  . Cervical cancer Daughter   . Kidney cancer Daughter     Allergies  Allergen  Reactions  . Percocet [Oxycodone-Acetaminophen] Other (See Comments)    Upsets stomach- does tolerate   . Influenza Virus Vacc Split Pf Other (See Comments)    Unknown  But had to be hospitalized -1966  . Sulfamethoxazole-Trimethoprim Nausea And Vomiting  . Hibiclens [Chlorhexidine Gluconate] Rash    Current Outpatient Medications on File Prior to Visit  Medication Sig Dispense Refill  . acetaminophen (TYLENOL) 650 MG CR tablet Take 1,300 mg by mouth daily as needed for pain.    Marland Kitchen atorvastatin (LIPITOR) 10 MG tablet TAKE 1 TABLET DAILY (Patient taking differently: TAKE 1 TABLET DAILY IN THE EVENING.) 90 tablet 2  . levothyroxine (SYNTHROID, LEVOTHROID) 100 MCG tablet Take 1 tablet (100 mcg total) by mouth daily. 90 tablet 0  . MYRBETRIQ 50 MG TB24 tablet Take 50 mg by mouth daily as needed (bladder spasms).   3  . PROAIR HFA 108 (90 Base) MCG/ACT inhaler USE 2 INHALATIONS INTO THE LUNGS EVERY 4 HOURS AS NEEDED FOR SHORTNESS OF BREATH 25.5 g 5  . TOPROL XL 25 MG 24 hr tablet TAKE ONE-HALF (1/2) TABLET DAILY (Patient taking differently: Take 12.5 mg by mouth every evening. TAKE ONE-HALF (1/2) TABLET DAILY) 90 tablet 1  . traMADol-acetaminophen (ULTRACET) 37.5-325 MG tablet Take 1 tablet by mouth every 6 (six) hours as needed for moderate pain. 15 tablet 0  . Trospium Chloride 60 MG CP24 Take 1 capsule by mouth daily.     No current facility-administered medications on file prior to visit.     BP 92/62 (BP Location: Right Arm, Patient Position: Sitting, Cuff Size: Normal)   Pulse 80   Temp 98.5 F (36.9 C) (Oral)   Resp 14   Wt 99 lb 6.4 oz (45.1 kg)   SpO2 94%   BMI 17.61 kg/m     Review of Systems  Constitutional: Positive for activity change and appetite change.  HENT: Negative for congestion, dental problem, hearing loss, rhinorrhea, sinus pressure, sore throat and tinnitus.   Eyes: Negative for pain, discharge and visual disturbance.  Respiratory: Negative for cough and  shortness of breath.   Cardiovascular: Negative for chest pain, palpitations and leg swelling.  Gastrointestinal: Positive for diarrhea, nausea and vomiting. Negative for abdominal distention, abdominal pain, blood in stool and constipation.  Genitourinary: Negative for difficulty urinating, dysuria, flank pain, frequency, hematuria, pelvic pain, urgency, vaginal bleeding, vaginal discharge and vaginal pain.  Musculoskeletal: Negative for arthralgias, gait problem and joint swelling.  Skin: Negative for rash.  Neurological: Negative for dizziness, syncope, speech difficulty, weakness, numbness and headaches.  Hematological: Negative for adenopathy.  Psychiatric/Behavioral: Negative for agitation, behavioral problems and dysphoric mood. The patient is not nervous/anxious.        Objective:   Physical Exam  Constitutional: She is oriented to person, place, and time. She appears well-developed and well-nourished.  Weak frail Emesis basin and lap Blood pressure low normal No tachycardia Afebrile Wheelchair-bound  HENT:  Head: Normocephalic.  Right Ear: External ear normal.  Left Ear: External ear normal.  Mouth/Throat: Oropharynx is clear and moist.  Appears moist and fairly well hydrated  Eyes: Pupils are equal, round, and reactive to light. Conjunctivae and EOM are normal.  Neck: Normal range of motion. Neck supple. No thyromegaly present.  Cardiovascular: Normal rate, regular rhythm, normal heart sounds and intact distal pulses.  Pulmonary/Chest: Effort normal and breath sounds normal.  Abdominal: Soft. Bowel sounds are normal. She exhibits no mass. There is no tenderness.  Colostomy bag left lower quadrant Soft and nondistended  Lymphadenopathy:    She has no cervical adenopathy.  Neurological: She is alert and oriented to person, place, and time.  Skin: Skin is warm and dry. No rash noted.  Psychiatric: She has a normal mood and affect. Her behavior is normal.            Assessment & Plan:   Nausea/weight loss.  The family discontinued the antibiotic therapy yesterday Will check updated lab History of renal and bladder calculi.  Status post CT renal study last week.  No evidence of hydronephrosis or metastatic rectal cancer Hypothyroidism.  Will recheck TSH  Follow-up urology as scheduled  The family is attempting placement to a skilled nursing facility  Marletta Lor

## 2018-08-13 ENCOUNTER — Other Ambulatory Visit: Payer: Self-pay | Admitting: Internal Medicine

## 2018-08-13 ENCOUNTER — Ambulatory Visit: Payer: Medicare Other | Admitting: Urology

## 2018-08-13 ENCOUNTER — Telehealth: Payer: Self-pay | Admitting: Internal Medicine

## 2018-08-13 LAB — CBC WITH DIFFERENTIAL/PLATELET
BASOS ABS: 0 10*3/uL (ref 0.0–0.1)
Basophils Relative: 0.4 % (ref 0.0–3.0)
EOS ABS: 0.1 10*3/uL (ref 0.0–0.7)
Eosinophils Relative: 0.5 % (ref 0.0–5.0)
HEMATOCRIT: 37.7 % (ref 36.0–46.0)
HEMOGLOBIN: 12.3 g/dL (ref 12.0–15.0)
LYMPHS PCT: 3.9 % — AB (ref 12.0–46.0)
Lymphs Abs: 0.5 10*3/uL — ABNORMAL LOW (ref 0.7–4.0)
MCHC: 32.6 g/dL (ref 30.0–36.0)
MCV: 91.8 fl (ref 78.0–100.0)
Monocytes Absolute: 1 10*3/uL (ref 0.1–1.0)
Monocytes Relative: 8.8 % (ref 3.0–12.0)
Neutro Abs: 10.3 10*3/uL — ABNORMAL HIGH (ref 1.4–7.7)
Neutrophils Relative %: 86.4 % — ABNORMAL HIGH (ref 43.0–77.0)
Platelets: 268 10*3/uL (ref 150.0–400.0)
RBC: 4.11 Mil/uL (ref 3.87–5.11)
RDW: 17.5 % — ABNORMAL HIGH (ref 11.5–15.5)
WBC: 11.9 10*3/uL — ABNORMAL HIGH (ref 4.0–10.5)

## 2018-08-13 LAB — COMPREHENSIVE METABOLIC PANEL
ALBUMIN: 3.3 g/dL — AB (ref 3.5–5.2)
ALT: 8 U/L (ref 0–35)
AST: 10 U/L (ref 0–37)
Alkaline Phosphatase: 81 U/L (ref 39–117)
BILIRUBIN TOTAL: 0.7 mg/dL (ref 0.2–1.2)
BUN: 25 mg/dL — ABNORMAL HIGH (ref 6–23)
CHLORIDE: 100 meq/L (ref 96–112)
CO2: 27 mEq/L (ref 19–32)
CREATININE: 0.91 mg/dL (ref 0.40–1.20)
Calcium: 10.1 mg/dL (ref 8.4–10.5)
GFR: 61.33 mL/min (ref >60.00)
Glucose, Bld: 140 mg/dL — ABNORMAL HIGH (ref 70–99)
Potassium: 4.9 mEq/L (ref 3.5–5.1)
Sodium: 136 mEq/L (ref 135–145)
Total Protein: 5.8 g/dL — ABNORMAL LOW (ref 6.0–8.3)

## 2018-08-13 LAB — TSH: TSH: 0.09 u[IU]/mL — ABNORMAL LOW (ref 0.35–4.50)

## 2018-08-13 MED ORDER — LEVOTHYROXINE SODIUM 75 MCG PO TABS: 75.0000 ug | ORAL_TABLET | Freq: Every day | ORAL | 2 refills | Status: DC

## 2018-08-13 MED ORDER — ONDANSETRON HCL 4 MG PO TABS: 4.0000 mg | ORAL_TABLET | Freq: Three times a day (TID) | ORAL | 0 refills | Status: AC | PRN

## 2018-08-13 MED ORDER — DIPHENOXYLATE-ATROPINE 2.5-0.025 MG PO TABS: 1.0000 | ORAL_TABLET | Freq: Four times a day (QID) | ORAL | 0 refills | Status: DC | PRN

## 2018-08-13 NOTE — Telephone Encounter (Signed)
Copied from [place] [id number]. Topic: Quick Communication - See Telephone Encounter >> Aug 13, 2018 11:03 AM Mylinda Latina, NT wrote: CRM for notification. See Telephone encounter for: 08/13/18. Pt daughter call  and states the patient is still experiencing vomiting and diarrhea . She is wondering is there anything that can be done or called in for her.  Please call CB# [phone]

## 2018-08-13 NOTE — Telephone Encounter (Signed)
Please advise 

## 2018-08-14 ENCOUNTER — Inpatient Hospital Stay (HOSPITAL_COMMUNITY)
Admission: EM | Admit: 2018-08-14 | Discharge: 2018-08-27 | DRG: 682 | Disposition: A | Discharge status: Skilled Nursing Facility | Payer: Medicare Other | Attending: Internal Medicine | Admitting: Internal Medicine

## 2018-08-14 ENCOUNTER — Emergency Department (HOSPITAL_COMMUNITY): Payer: Medicare Other

## 2018-08-14 ENCOUNTER — Encounter (HOSPITAL_COMMUNITY): Payer: Self-pay

## 2018-08-14 ENCOUNTER — Other Ambulatory Visit: Payer: Self-pay

## 2018-08-14 ENCOUNTER — Emergency Department (HOSPITAL_COMMUNITY)
Admission: EM | Admit: 2018-08-14 | Discharge: 2018-08-14 | Discharge status: Absent without leave | Payer: Medicare Other

## 2018-08-14 DIAGNOSIS — Z9841 Cataract extraction status, right eye: Secondary | ICD-10-CM

## 2018-08-14 DIAGNOSIS — Z85048 Personal history of other malignant neoplasm of rectum, rectosigmoid junction, and anus: Secondary | ICD-10-CM

## 2018-08-14 DIAGNOSIS — B9620 Unspecified Escherichia coli [E. coli] as the cause of diseases classified elsewhere: Secondary | ICD-10-CM | POA: Diagnosis present

## 2018-08-14 DIAGNOSIS — R112 Nausea with vomiting, unspecified: Secondary | ICD-10-CM

## 2018-08-14 DIAGNOSIS — B029 Zoster without complications: Secondary | ICD-10-CM | POA: Diagnosis present

## 2018-08-14 DIAGNOSIS — C20 Malignant neoplasm of rectum: Secondary | ICD-10-CM | POA: Diagnosis present

## 2018-08-14 DIAGNOSIS — Z9842 Cataract extraction status, left eye: Secondary | ICD-10-CM

## 2018-08-14 DIAGNOSIS — Z800 Family history of malignant neoplasm of digestive organs: Secondary | ICD-10-CM

## 2018-08-14 DIAGNOSIS — I714 Abdominal aortic aneurysm, without rupture: Secondary | ICD-10-CM | POA: Diagnosis present

## 2018-08-14 DIAGNOSIS — N390 Urinary tract infection, site not specified: Secondary | ICD-10-CM | POA: Diagnosis present

## 2018-08-14 DIAGNOSIS — Z681 Body mass index (BMI) 19 or less, adult: Secondary | ICD-10-CM

## 2018-08-14 DIAGNOSIS — E039 Hypothyroidism, unspecified: Secondary | ICD-10-CM | POA: Diagnosis present

## 2018-08-14 DIAGNOSIS — Z79890 Hormone replacement therapy (postmenopausal): Secondary | ICD-10-CM

## 2018-08-14 DIAGNOSIS — Z960 Presence of urogenital implants: Secondary | ICD-10-CM | POA: Diagnosis present

## 2018-08-14 DIAGNOSIS — R197 Diarrhea, unspecified: Secondary | ICD-10-CM | POA: Diagnosis present

## 2018-08-14 DIAGNOSIS — K567 Ileus, unspecified: Secondary | ICD-10-CM

## 2018-08-14 DIAGNOSIS — E860 Dehydration: Secondary | ICD-10-CM | POA: Diagnosis not present

## 2018-08-14 DIAGNOSIS — Z8249 Family history of ischemic heart disease and other diseases of the circulatory system: Secondary | ICD-10-CM

## 2018-08-14 DIAGNOSIS — F0150 Vascular dementia without behavioral disturbance: Secondary | ICD-10-CM | POA: Diagnosis present

## 2018-08-14 DIAGNOSIS — K219 Gastro-esophageal reflux disease without esophagitis: Secondary | ICD-10-CM | POA: Diagnosis present

## 2018-08-14 DIAGNOSIS — J449 Chronic obstructive pulmonary disease, unspecified: Secondary | ICD-10-CM | POA: Diagnosis present

## 2018-08-14 DIAGNOSIS — R1111 Vomiting without nausea: Secondary | ICD-10-CM | POA: Diagnosis not present

## 2018-08-14 DIAGNOSIS — L8990 Pressure ulcer of unspecified site, unspecified stage: Secondary | ICD-10-CM

## 2018-08-14 DIAGNOSIS — K529 Noninfective gastroenteritis and colitis, unspecified: Secondary | ICD-10-CM

## 2018-08-14 DIAGNOSIS — N179 Acute kidney failure, unspecified: Secondary | ICD-10-CM | POA: Diagnosis not present

## 2018-08-14 DIAGNOSIS — E43 Unspecified severe protein-calorie malnutrition: Secondary | ICD-10-CM | POA: Diagnosis not present

## 2018-08-14 DIAGNOSIS — Z79891 Long term (current) use of opiate analgesic: Secondary | ICD-10-CM

## 2018-08-14 DIAGNOSIS — Z9049 Acquired absence of other specified parts of digestive tract: Secondary | ICD-10-CM

## 2018-08-14 DIAGNOSIS — L89152 Pressure ulcer of sacral region, stage 2: Secondary | ICD-10-CM | POA: Diagnosis present

## 2018-08-14 DIAGNOSIS — R7989 Other specified abnormal findings of blood chemistry: Secondary | ICD-10-CM | POA: Diagnosis present

## 2018-08-14 DIAGNOSIS — R4182 Altered mental status, unspecified: Secondary | ICD-10-CM | POA: Diagnosis not present

## 2018-08-14 DIAGNOSIS — R109 Unspecified abdominal pain: Secondary | ICD-10-CM

## 2018-08-14 DIAGNOSIS — R1110 Vomiting, unspecified: Secondary | ICD-10-CM | POA: Diagnosis not present

## 2018-08-14 DIAGNOSIS — I10 Essential (primary) hypertension: Secondary | ICD-10-CM

## 2018-08-14 DIAGNOSIS — A09 Infectious gastroenteritis and colitis, unspecified: Secondary | ICD-10-CM | POA: Diagnosis present

## 2018-08-14 DIAGNOSIS — R32 Unspecified urinary incontinence: Secondary | ICD-10-CM | POA: Diagnosis present

## 2018-08-14 DIAGNOSIS — E785 Hyperlipidemia, unspecified: Secondary | ICD-10-CM | POA: Diagnosis present

## 2018-08-14 DIAGNOSIS — Z0189 Encounter for other specified special examinations: Secondary | ICD-10-CM

## 2018-08-14 DIAGNOSIS — Z9221 Personal history of antineoplastic chemotherapy: Secondary | ICD-10-CM

## 2018-08-14 DIAGNOSIS — Z79899 Other long term (current) drug therapy: Secondary | ICD-10-CM

## 2018-08-14 DIAGNOSIS — Z87442 Personal history of urinary calculi: Secondary | ICD-10-CM

## 2018-08-14 DIAGNOSIS — K56609 Unspecified intestinal obstruction, unspecified as to partial versus complete obstruction: Secondary | ICD-10-CM

## 2018-08-14 DIAGNOSIS — Z90710 Acquired absence of both cervix and uterus: Secondary | ICD-10-CM

## 2018-08-14 DIAGNOSIS — I700 Atherosclerosis of aorta: Secondary | ICD-10-CM | POA: Diagnosis present

## 2018-08-14 DIAGNOSIS — Z961 Presence of intraocular lens: Secondary | ICD-10-CM | POA: Diagnosis present

## 2018-08-14 DIAGNOSIS — Z888 Allergy status to other drugs, medicaments and biological substances status: Secondary | ICD-10-CM

## 2018-08-14 DIAGNOSIS — Z887 Allergy status to serum and vaccine status: Secondary | ICD-10-CM

## 2018-08-14 DIAGNOSIS — M810 Age-related osteoporosis without current pathological fracture: Secondary | ICD-10-CM | POA: Diagnosis present

## 2018-08-14 DIAGNOSIS — I2510 Atherosclerotic heart disease of native coronary artery without angina pectoris: Secondary | ICD-10-CM | POA: Diagnosis present

## 2018-08-14 DIAGNOSIS — Z90722 Acquired absence of ovaries, bilateral: Secondary | ICD-10-CM

## 2018-08-14 DIAGNOSIS — Z87891 Personal history of nicotine dependence: Secondary | ICD-10-CM

## 2018-08-14 DIAGNOSIS — Z933 Colostomy status: Secondary | ICD-10-CM

## 2018-08-14 DIAGNOSIS — Z885 Allergy status to narcotic agent status: Secondary | ICD-10-CM

## 2018-08-14 HISTORY — DX: Abdominal aortic aneurysm, without rupture: I71.4

## 2018-08-14 HISTORY — DX: Abdominal aortic aneurysm, without rupture, unspecified: I71.40

## 2018-08-14 LAB — URINALYSIS, ROUTINE W REFLEX MICROSCOPIC
Bilirubin Urine: NEGATIVE
GLUCOSE, UA: NEGATIVE mg/dL
Ketones, ur: 20 mg/dL — AB
NITRITE: POSITIVE — AB
Protein, ur: 100 mg/dL — AB
SPECIFIC GRAVITY, URINE: 1.018 (ref 1.005–1.030)
WBC, UA: >50 WBC/hpf — ABNORMAL HIGH (ref 0–5)
pH: 5 (ref 5.0–8.0)

## 2018-08-14 LAB — COMPREHENSIVE METABOLIC PANEL
ALT: 12 U/L (ref 0–44)
ANION GAP: 12 (ref 5–15)
AST: 13 U/L — ABNORMAL LOW (ref 15–41)
Albumin: 2.9 g/dL — ABNORMAL LOW (ref 3.5–5.0)
Alkaline Phosphatase: 78 U/L (ref 38–126)
BILIRUBIN TOTAL: 1.2 mg/dL (ref 0.3–1.2)
BUN: 33 mg/dL — ABNORMAL HIGH (ref 8–23)
CALCIUM: 10.2 mg/dL (ref 8.9–10.3)
CO2: 26 mmol/L (ref 22–32)
Chloride: 100 mmol/L (ref 98–111)
Creatinine, Ser: 1.12 mg/dL — ABNORMAL HIGH (ref 0.44–1.00)
GFR, EST AFRICAN AMERICAN: 48 mL/min — AB (ref >60)
GFR, EST NON AFRICAN AMERICAN: 41 mL/min — AB (ref >60)
Glucose, Bld: 80 mg/dL (ref 70–99)
POTASSIUM: 4.3 mmol/L (ref 3.5–5.1)
Sodium: 138 mmol/L (ref 135–145)
TOTAL PROTEIN: 5.9 g/dL — AB (ref 6.5–8.1)

## 2018-08-14 LAB — CBC WITH DIFFERENTIAL/PLATELET
BASOS ABS: 0 10*3/uL (ref 0.0–0.1)
BASOS PCT: 0 %
EOS ABS: 0.1 10*3/uL (ref 0.0–0.7)
EOS PCT: 1 %
HCT: 40.1 % (ref 36.0–46.0)
Hemoglobin: 12.9 g/dL (ref 12.0–15.0)
Lymphocytes Relative: 4 %
Lymphs Abs: 0.4 10*3/uL — ABNORMAL LOW (ref 0.7–4.0)
MCH: 29.9 pg (ref 26.0–34.0)
MCHC: 32.2 g/dL (ref 30.0–36.0)
MCV: 93 fL (ref 78.0–100.0)
MONO ABS: 0.6 10*3/uL (ref 0.1–1.0)
Monocytes Relative: 6 %
Neutro Abs: 9.4 10*3/uL — ABNORMAL HIGH (ref 1.7–7.7)
Neutrophils Relative %: 89 %
PLATELETS: 239 10*3/uL (ref 150–400)
RBC: 4.31 MIL/uL (ref 3.87–5.11)
RDW: 15.5 % (ref 11.5–15.5)
WBC: 10.5 10*3/uL (ref 4.0–10.5)

## 2018-08-14 LAB — TROPONIN I: TROPONIN I: 0.04 ng/mL — AB (ref <0.03)

## 2018-08-14 LAB — LACTIC ACID, PLASMA: LACTIC ACID, VENOUS: 0.9 mmol/L (ref 0.5–1.9)

## 2018-08-14 LAB — LIPASE, BLOOD: Lipase: 22 U/L (ref 11–51)

## 2018-08-14 MED ORDER — TROSPIUM CHLORIDE ER 60 MG PO CP24: 1.0000 | ORAL_CAPSULE | Freq: Every day | ORAL | Status: DC

## 2018-08-14 MED ORDER — SODIUM CHLORIDE 0.9 % IV BOLUS
250.0000 mL | Freq: Once | INTRAVENOUS | Status: AC
  Administered 2018-08-14: 250 mL via INTRAVENOUS

## 2018-08-14 MED ORDER — ATORVASTATIN CALCIUM 10 MG PO TABS
10.0000 mg | ORAL_TABLET | Freq: Every day | ORAL | Status: DC
  Administered 2018-08-14 – 2018-08-18 (×5): 10 mg via ORAL
  Filled 2018-08-14 (×5): qty 1

## 2018-08-14 MED ORDER — CIPROFLOXACIN IN D5W 400 MG/200ML IV SOLN
400.0000 mg | INTRAVENOUS | Status: DC
  Administered 2018-08-15 – 2018-08-16 (×2): 400 mg via INTRAVENOUS
  Filled 2018-08-14 (×2): qty 200

## 2018-08-14 MED ORDER — SODIUM CHLORIDE 0.9 % IV SOLN
INTRAVENOUS | Status: DC
  Administered 2018-08-14 – 2018-08-24 (×11): via INTRAVENOUS

## 2018-08-14 MED ORDER — ACETAMINOPHEN 325 MG PO TABS: 650.0000 mg | ORAL_TABLET | Freq: Four times a day (QID) | ORAL | Status: DC | PRN

## 2018-08-14 MED ORDER — ONDANSETRON HCL 4 MG/2ML IJ SOLN
4.0000 mg | Freq: Four times a day (QID) | INTRAMUSCULAR | Status: DC | PRN
  Administered 2018-08-17 – 2018-08-19 (×5): 4 mg via INTRAVENOUS
  Filled 2018-08-14 (×5): qty 2

## 2018-08-14 MED ORDER — PRO-STAT SUGAR FREE PO LIQD
30.0000 mL | Freq: Two times a day (BID) | ORAL | Status: DC
  Administered 2018-08-14 – 2018-08-15 (×2): 30 mL via ORAL
  Filled 2018-08-14 (×2): qty 30

## 2018-08-14 MED ORDER — METOPROLOL SUCCINATE ER 25 MG PO TB24
12.5000 mg | ORAL_TABLET | Freq: Every evening | ORAL | Status: DC
  Administered 2018-08-14 – 2018-08-26 (×11): 12.5 mg via ORAL
  Filled 2018-08-14 (×13): qty 1

## 2018-08-14 MED ORDER — ALBUTEROL SULFATE (2.5 MG/3ML) 0.083% IN NEBU: 3.0000 mL | INHALATION_SOLUTION | RESPIRATORY_TRACT | Status: DC | PRN

## 2018-08-14 MED ORDER — DIPHENOXYLATE-ATROPINE 2.5-0.025 MG PO TABS
1.0000 | ORAL_TABLET | Freq: Four times a day (QID) | ORAL | Status: DC | PRN
  Filled 2018-08-14: qty 1

## 2018-08-14 MED ORDER — CIPROFLOXACIN IN D5W 400 MG/200ML IV SOLN
400.0000 mg | Freq: Once | INTRAVENOUS | Status: AC
  Administered 2018-08-14: 400 mg via INTRAVENOUS
  Filled 2018-08-14: qty 200

## 2018-08-14 MED ORDER — LEVOTHYROXINE SODIUM 100 MCG PO TABS
100.0000 ug | ORAL_TABLET | Freq: Every day | ORAL | Status: DC
  Administered 2018-08-15 – 2018-08-19 (×5): 100 ug via ORAL
  Filled 2018-08-14 (×5): qty 1

## 2018-08-14 MED ORDER — DARIFENACIN HYDROBROMIDE ER 7.5 MG PO TB24
7.5000 mg | ORAL_TABLET | Freq: Every day | ORAL | Status: DC
  Administered 2018-08-15 – 2018-08-27 (×11): 7.5 mg via ORAL
  Filled 2018-08-14 (×13): qty 1

## 2018-08-14 MED ORDER — METRONIDAZOLE IN NACL 5-0.79 MG/ML-% IV SOLN
500.0000 mg | Freq: Once | INTRAVENOUS | Status: AC
  Administered 2018-08-14: 500 mg via INTRAVENOUS
  Filled 2018-08-14: qty 100

## 2018-08-14 MED ORDER — SODIUM CHLORIDE 0.9 % IV SOLN
INTRAVENOUS | Status: AC
  Administered 2018-08-14: 23:00:00 via INTRAVENOUS

## 2018-08-14 MED ORDER — ACETAMINOPHEN 650 MG RE SUPP: 650.0000 mg | Freq: Four times a day (QID) | RECTAL | Status: DC | PRN

## 2018-08-14 MED ORDER — METRONIDAZOLE IN NACL 5-0.79 MG/ML-% IV SOLN
500.0000 mg | Freq: Three times a day (TID) | INTRAVENOUS | Status: DC
  Administered 2018-08-15 – 2018-08-17 (×7): 500 mg via INTRAVENOUS
  Filled 2018-08-14 (×7): qty 100

## 2018-08-14 MED ORDER — ENOXAPARIN SODIUM 30 MG/0.3ML ~~LOC~~ SOLN
30.0000 mg | SUBCUTANEOUS | Status: DC
  Administered 2018-08-14 – 2018-08-26 (×13): 30 mg via SUBCUTANEOUS
  Filled 2018-08-14 (×13): qty 0.3

## 2018-08-14 NOTE — Telephone Encounter (Signed)
Talked with daughter earlier this a.m.  Patient still doing poorly with very little oral intake.  Advised family to take the patient to the ED for further evaluation and probable admission for possible dehydration and antibiotic associated C. difficile colitis

## 2018-08-14 NOTE — ED Triage Notes (Signed)
Patient has not been eating or drinking and when she does she has vomiting and diarrhea x several days. Patient was sent by her PCP for possible admission. Patient's family reports that she has been having memory issues and family voiced that they would like to have her in a nursing home.

## 2018-08-14 NOTE — ED Notes (Signed)
Date and time results received: 08/14/18 1956   Test: troponin Critical Value: 0.04  Name of Provider Notified: Thurnell Garbe  Orders Received? Or Actions Taken?:

## 2018-08-14 NOTE — ED Provider Notes (Signed)
[facility] DEPT Provider Note   CSN: [id number] Arrival date & time: 08/14/18  1522     History   Chief Complaint Chief Complaint  Patient presents with  . Emesis  . Diarrhea    HPI Sherry [name] is a 84 y.o. female.  The history is provided by the patient, a relative and a caregiver. The history is limited by the condition of the patient ("memory issues" per family).  Emesis   Associated symptoms include diarrhea.  Diarrhea   Associated symptoms include vomiting.    Pt was seen at [facility]. Per pt and her family: Pt with gradual onset and persistence of multiple intermittent episodes of N/V/D that began 5 days ago. Describes the stools as "watery."  Family states pt has been grabbing her abd and c/o "pain."  Pt was evaluated by her PMD yesterday for her symptoms, and told to come to the ED today to "get admitted for dehydration." Family states pt was evaluated by her Uro MD 1 week ago, told her "urine smelled bad" and was started on abx (last Wednesday). Pt's N/V/D began approximately 3 days later. Family also states pt has had "memory problems," increasing confusion, poor PO intake, decreased activity (ie: "just laying around, won't get out of bed"), and weight loss for the past 2+ months. Family states they are trying to place pt in a SNF. Denies CP/SOB, no back pain, no fevers, no black or blood in stools or emesis, no focal motor weakness, no reported falls.    Past Medical History:  Diagnosis Date  . Anemia   . Arthritis   . Bronchitis   . CAD (coronary artery disease)   . COPD (chronic obstructive pulmonary disease) ([place])   . Emphysema of lung ([place])   . GERD (gastroesophageal reflux disease)   . Headache(784.0)   . History of blood transfusion   . History of kidney stones   . Hyperlipidemia   . Hypertension   . Hypothyroidism   . Osteoporosis   . Rectal adenocarcinoma ([place]) dx'd 08/2009  . Rectal cancer ([place])   . Scoliosis    per Dr  Melony Overly ortho  . Shingles   . Shortness of breath   . Tendon adhesions    torn tendon right shoulder    Patient Active Problem List   Diagnosis Date Noted  . Herpes zoster without complication 88/41/6606  . Hematuria, gross 01/28/2017  . Acquired pelvic enterocele 12/28/2013  . Unsteady gait 11/26/2013  . Colostomy care ([place]) 08/21/2013  . Renal lesion 07/26/2013  . Skin lesion - left lower quadrant abdominal wall 01/21/2013  . Parastomal hernia without obstruction or gangrene 09/26/2012  . PARESTHESIA, HANDS 08/15/2010  . ADENOCARCINOMA, RECTUM 09/01/2009  . Hypothyroidism 09/01/2009  . Dyslipidemia 09/01/2009  . ANEMIA-IRON DEFICIENCY 09/01/2009  . Essential hypertension 09/01/2009  . COPD 09/01/2009  . OSTEOPOROSIS 09/01/2009    Past Surgical History:  Procedure Laterality Date  . APPENDECTOMY    . BALLOON DILATION Left 04/10/2017   Procedure: BALLOON DILATION OF LEFT URETERAL STRICTURE;  Surgeon: Cleon Gustin, MD;  Location: [facility];  Service: Urology;  Laterality: Left;  . CARDIAC CATHETERIZATION    . CHOLECYSTECTOMY    . COLON REMOVAL     8 " 12/2009 DR TSUEI  . COLON SURGERY     [facility] for rectal cancer 2011  . COLONOSCOPY    . COLOSTOMY    . CYSTOSCOPY W/ URETERAL STENT PLACEMENT Left 03/18/2017   Procedure: CYSTOSCOPY WITH  RETROGRADE PYELOGRAM/URETERAL STENT PLACEMENT AND URETERAL DIAGNOSTIC LEFT;  Surgeon: Cleon Gustin, MD;  Location: [facility];  Service: Urology;  Laterality: Left;  1 HR [phone] [id number] A [phone] - pt to arrive at 7:00 to receive blood  . CYSTOSCOPY W/ URETERAL STENT PLACEMENT Left 04/10/2017   Procedure: CYSTOSCOPY WITH STENT REPLACEMENT;  Surgeon: Cleon Gustin, MD;  Location: [facility];  Service: Urology;  Laterality: Left;  . CYSTOSCOPY W/ URETERAL STENT PLACEMENT Left 06/03/2017   Procedure: CYSTOSCOPY WITH LEFT RETROGRADE PYELOGRAM/LEFT URETERAL  BALLOON DILATION AND LEFT URETERAL STENT PLACEMENT;   Surgeon: Cleon Gustin, MD;  Location: [facility];  Service: Urology;  Laterality: Left;  . CYSTOSCOPY W/ URETERAL STENT PLACEMENT Left 09/16/2017   Procedure: CYSTOSCOPY WITH LEFT STENT EXCHANGE/ RETROGRADE;  Surgeon: Cleon Gustin, MD;  Location: [facility];  Service: Urology;  Laterality: Left;  NEEDS TOTAL 30 MIN  . CYSTOSCOPY W/ URETERAL STENT PLACEMENT Left 02/12/2018   Procedure: CYSTOSCOPY WITH RETROGRADE PYELOGRAM/URETERAL STENT EXCHANGE/HOLMIUM LASER;  Surgeon: Cleon Gustin, MD;  Location: [facility];  Service: Urology;  Laterality: Left;  . CYSTOSCOPY W/ URETERAL STENT PLACEMENT Left 04/30/2018   Procedure: CYSTOSCOPY WITH LEFT RETROGRADE PYELOGRAM/LEFT URETERAL STENT EXCHANGE;  Surgeon: Cleon Gustin, MD;  Location: [facility];  Service: Urology;  Laterality: Left;  . CYSTOSCOPY W/ URETERAL STENT PLACEMENT Left 07/09/2018   Procedure: CYSTOSCOPY WITH LEFT RETROGRADE PYELOGRAM/LEFT URETERAL STENT EXCHANGE;  Surgeon: Cleon Gustin, MD;  Location: [facility];  Service: Urology;  Laterality: Left;  . CYSTOSCOPY WITH HOLMIUM LASER LITHOTRIPSY  07/09/2018   Procedure: CYSTOSCOPY WITH HOLMIUM LASER LITHOTRIPSY BLADDER CALCULUS;  Surgeon: Cleon Gustin, MD;  Location: [facility];  Service: Urology;;  . Consuela Mimes WITH RETROGRADE PYELOGRAM, URETEROSCOPY AND STENT PLACEMENT Left 03/29/2016   Procedure: CYSTOSCOPY WITH RETROGRADE PYELOGRAM, URETEROSCOPY AND STENT PLACEMENT;  Surgeon: Kathie Rhodes, MD;  Location: [facility];  Service: Urology;  Laterality: Left;  With Laser  . CYSTOSCOPY/RETROGRADE/URETEROSCOPY Left 01/28/2017   Procedure: CYSTOSCOPY/RETROGRADE/URETEROSCOPY (FLEX 6 fr) THULIUM LASER OF TUMOR;  Surgeon: Franchot Gallo, MD;  Location: [facility];  Service: Urology;  Laterality: Left;  . CYSTOSCOPY/RETROGRADE/URETEROSCOPY Left 04/10/2017   Procedure: CYSTOSCOPY, LEFT RETROGRADE PYELOGRAM LEFT URETEROSCOPY;  Surgeon: Cleon Gustin, MD;  Location: [facility];  Service: Urology;   Laterality: Left;  . EYE SURGERY     Cataract with lens- bil  . HOLMIUM LASER APPLICATION Left 07/01/9677   Procedure: HOLMIUM LASER ABLATION LEFT URETERAL TUMOR;  Surgeon: Cleon Gustin, MD;  Location: [facility];  Service: Urology;  Laterality: Left;  . HOLMIUM LASER APPLICATION Left 9/38/1017   Procedure: HOLMIUM LASER LITHOTRIPSY BLADDER CALCULUS (LEFT ENCRUSTED URETERAL STENT);  Surgeon: Cleon Gustin, MD;  Location: [facility];  Service: Urology;  Laterality: Left;  . OOPHORECTOMY    . ovary tumor removal     after son was born  . PARASTOMAL HERNIA REPAIR  10/30/2012  . POLYPECTOMY    . RECTUM REMOVAL     1/11 DR TSUEI  . TRANSURETHRAL RESECTION OF BLADDER TUMOR N/A 10/21/2017   Procedure: TRANSURETHRAL RESECTION OF BLADDER TUMOR (TURBT);  Surgeon: Cleon Gustin, MD;  Location: [facility];  Service: Urology;  Laterality: N/A;  . TRANSURETHRAL RESECTION OF BLADDER TUMOR N/A 02/12/2018   Procedure: TRANSURETHRAL RESECTION OF BLADDER TUMOR (TURBT);  Surgeon: Cleon Gustin, MD;  Location: [facility];  Service: Urology;  Laterality: N/A;  . TUBAL LIGATION    . VAGINAL HYSTERECTOMY    . VENTRAL HERNIA REPAIR  10/30/2012   Procedure: LAPAROSCOPIC VENTRAL HERNIA;  Surgeon: Imogene Burn. Georgette Dover, MD;  Location: [facility];  Service: General;  Laterality: N/A;  Laparoscopic repair of parastomal hernia     OB History   None      Home Medications    Prior to Admission medications   Medication Sig Start Date End Date Taking? Authorizing Provider  acetaminophen (TYLENOL) 650 MG CR tablet Take 1,300 mg by mouth daily as needed for pain.    [provider]  atorvastatin (LIPITOR) 10 MG tablet TAKE 1 TABLET DAILY Patient taking differently: TAKE 1 TABLET DAILY IN THE EVENING. 11/06/17   Marletta Lor, MD  diphenoxylate-atropine (LOMOTIL) 2.5-0.025 MG tablet Take 1 tablet by mouth 4 (four) times daily as needed for diarrhea or loose stools. 08/13/18   Marletta Lor, MD    levothyroxine (SYNTHROID, LEVOTHROID) 75 MCG tablet Take 1 tablet (75 mcg total) by mouth daily. 08/13/18   Marletta Lor, MD  MYRBETRIQ 50 MG TB24 tablet Take 50 mg by mouth daily as needed (bladder spasms).  03/26/18   [provider]  ondansetron (ZOFRAN) 4 MG tablet Take 1 tablet (4 mg total) by mouth every 8 (eight) hours as needed for nausea or vomiting. 08/13/18   Marletta Lor, MD  PROAIR HFA 108 [phone] Base) MCG/ACT inhaler USE 2 INHALATIONS INTO THE LUNGS EVERY 4 HOURS AS NEEDED FOR SHORTNESS OF BREATH 02/14/16   Marletta Lor, MD  TOPROL XL 25 MG 24 hr tablet TAKE ONE-HALF (1/2) TABLET DAILY Patient taking differently: Take 12.5 mg by mouth every evening. TAKE ONE-HALF (1/2) TABLET DAILY 10/22/17   Marletta Lor, MD  traMADol-acetaminophen (ULTRACET) 37.5-325 MG tablet Take 1 tablet by mouth every 6 (six) hours as needed for moderate pain. 07/09/18   McKenzie, Candee Furbish, MD  Trospium Chloride 60 MG CP24 Take 1 capsule by mouth daily.    [provider]    Family History Family History  Problem Relation Age of Onset  . Heart failure Sister   . Cervical cancer Sister   . Heart failure Brother   . Colon cancer Brother   . Colon cancer Brother   . Heart failure Father   . Cancer Mother        HEAD AND NECK  . Cervical cancer Daughter   . Kidney cancer Daughter     Social History Social History   Tobacco Use  . Smoking status: Former Smoker    Packs/day: 1.50    Years: 65.00    Pack years: 97.50    Types: Cigarettes    Last attempt to quit: 12/18/2003    Years since quitting: 14.6  . Smokeless tobacco: Never Used  . Tobacco comment: quit 2005  Substance Use Topics  . Alcohol use: No    Alcohol/week: 0.0 standard drinks  . Drug use: No     Allergies   Percocet [oxycodone-acetaminophen]; Influenza virus vacc split pf; Sulfamethoxazole-trimethoprim; and Hibiclens [chlorhexidine gluconate]   Review of Systems Review of Systems   Unable to perform ROS: Mental status change  Gastrointestinal: Positive for diarrhea and vomiting.     Physical Exam Updated Vital Signs BP 92/69 (BP Location: Left Arm)   Pulse 99   Temp 97.8 F (36.6 C) (Oral)   Resp 18   Ht 5' 2.5" (1.588 m)   Wt 44.9 kg   SpO2 90%   BMI 17.82 kg/m    BP 137/82   Pulse (!) 101   Temp  97.8 F (36.6 C) (Oral)   Resp (!) 27   Ht 5' 2.5" (1.588 m)   Wt 44.9 kg   SpO2 100%   BMI 17.82 kg/m    17:25 Orthostatic Vital Signs BA  Orthostatic Lying   BP- Lying: 118/76   Pulse- Lying: 96       Orthostatic Sitting  BP- Sitting: 104/74   Pulse- Sitting: 100       Orthostatic Standing at 0 minutes  BP- Standing at 0 minutes: 94/66   Pulse- Standing at 0 minutes: 101     Physical Exam 1650: Physical examination:  Nursing notes reviewed; Vital signs and O2 SAT reviewed;  Constitutional: Thin, frail. In no acute distress; Head:  Normocephalic, atraumatic; Eyes: EOMI, PERRL, No scleral icterus; ENMT: Mouth and pharynx normal, Mucous membranes dry; Neck: Supple, Full range of motion, No lymphadenopathy; Cardiovascular: Regular rate and rhythm, No gallop; Respiratory: Breath sounds clear & equal bilaterally, No wheezes. Normal respiratory effort/excursion; Chest: Nontender, Movement normal; Abdomen: Soft, Nontender, Nondistended, increased bowel sounds, +colostomy.; Genitourinary: No CVA tenderness; Extremities: Peripheral pulses normal, No tenderness, No edema, No calf edema or asymmetry.; Neuro: Awake, alert, mildly confused re: events. No facial droop. Major CN grossly intact. Speech minimal, clear. Moves all extremities spontaneously and to command without apparent gross focal motor deficits.; Skin: Color normal, Warm, Dry.   ED Treatments / Results  Labs (all labs ordered are listed, but only abnormal results are displayed)   EKG EKG Interpretation  Date/Time:  Thursday August 14 2018 17:28:47 EDT Ventricular Rate:  99 PR  Interval:    QRS Duration: 86 QT Interval:  343 QTC Calculation: 441 R Axis:   32 Text Interpretation:  Sinus rhythm Multiple ventricular premature complexes Baseline wander When compared with ECG of 04/25/2018 No significant change was found Confirmed by Francine Graven [phone]) on 08/14/2018 5:46:33 PM   Radiology   Procedures Procedures (including critical care time)  Medications Ordered in ED Medications  0.9 %  sodium chloride infusion (has no administration in time range)     Initial Impression / Assessment and Plan / ED Course  I have reviewed the triage vital signs and the nursing notes.  Pertinent labs & imaging results that were available during my care of the patient were reviewed by me and considered in my medical decision making (see chart for details).  MDM Reviewed: previous chart, nursing note and vitals Reviewed previous: labs and ECG Interpretation: labs, ECG, x-ray and CT scan   Results for orders placed or performed during the hospital encounter of 08/14/18  Lipase, blood  Result Value Ref Range   Lipase 22 11 - 51 U/L  Comprehensive metabolic panel  Result Value Ref Range   Sodium 138 135 - 145 mmol/L   Potassium 4.3 3.5 - 5.1 mmol/L   Chloride 100 98 - 111 mmol/L   CO2 26 22 - 32 mmol/L   Glucose, Bld 80 70 - 99 mg/dL   BUN 33 (H) 8 - 23 mg/dL   Creatinine, Ser 1.12 (H) 0.44 - 1.00 mg/dL   Calcium 10.2 8.9 - 10.3 mg/dL   Total Protein 5.9 (L) 6.5 - 8.1 g/dL   Albumin 2.9 (L) 3.5 - 5.0 g/dL   AST 13 (L) 15 - 41 U/L   ALT 12 0 - 44 U/L   Alkaline Phosphatase 78 38 - 126 U/L   Total Bilirubin 1.2 0.3 - 1.2 mg/dL   GFR calc non Af Amer 41 (L) >60 mL/min  GFR calc Af Amer 48 (L) >60 mL/min   Anion gap 12 5 - 15  Urinalysis, Routine w reflex microscopic  Result Value Ref Range   Color, Urine AMBER (A) YELLOW   APPearance CLOUDY (A) CLEAR   Specific Gravity, Urine 1.018 1.005 - 1.030   pH 5.0 5.0 - 8.0   Glucose, UA NEGATIVE NEGATIVE mg/dL    Hgb urine dipstick MODERATE (A) NEGATIVE   Bilirubin Urine NEGATIVE NEGATIVE   Ketones, ur 20 (A) NEGATIVE mg/dL   Protein, ur 100 (A) NEGATIVE mg/dL   Nitrite POSITIVE (A) NEGATIVE   Leukocytes, UA LARGE (A) NEGATIVE   RBC / HPF >50 (H) 0 - 5 RBC/hpf   WBC, UA >50 (H) 0 - 5 WBC/hpf   Bacteria, UA FEW (A) NONE SEEN   Squamous Epithelial / LPF 11-20 0 - 5   WBC Clumps PRESENT    Mucus PRESENT    Non Squamous Epithelial 0-5 (A) NONE SEEN  CBC with Differential  Result Value Ref Range   WBC 10.5 4.0 - 10.5 K/uL   RBC 4.31 3.87 - 5.11 MIL/uL   Hemoglobin 12.9 12.0 - 15.0 g/dL   HCT 40.1 36.0 - 46.0 %   MCV 93.0 78.0 - 100.0 fL   MCH 29.9 26.0 - 34.0 pg   MCHC 32.2 30.0 - 36.0 g/dL   RDW 15.5 11.5 - 15.5 %   Platelets 239 150 - 400 K/uL   Neutrophils Relative % 89 %   Neutro Abs 9.4 (H) 1.7 - 7.7 K/uL   Lymphocytes Relative 4 %   Lymphs Abs 0.4 (L) 0.7 - 4.0 K/uL   Monocytes Relative 6 %   Monocytes Absolute 0.6 0.1 - 1.0 K/uL   Eosinophils Relative 1 %   Eosinophils Absolute 0.1 0.0 - 0.7 K/uL   Basophils Relative 0 %   Basophils Absolute 0.0 0.0 - 0.1 K/uL  Troponin I  Result Value Ref Range   Troponin I 0.04 (HH) <0.03 ng/mL  Lactic acid, plasma  Result Value Ref Range   Lactic Acid, Venous 0.9 0.5 - 1.9 mmol/L   Ct Abdomen Pelvis Wo Contrast Result Date: 08/14/2018 CLINICAL DATA:  Vomiting and diarrhea for several days. EXAM: CT ABDOMEN AND PELVIS WITHOUT CONTRAST TECHNIQUE: Multidetector CT imaging of the abdomen and pelvis was performed following the standard protocol without IV contrast. COMPARISON:  08/06/2018 FINDINGS: Lower chest: Tortuosity and calcific atherosclerotic disease of the aorta. Calcific atherosclerotic disease of the coronary arteries. Mitral valve annular calcifications. Hepatobiliary: 6 mm hypoattenuated focus in the dome of the liver, too small to be actually characterize by CT. Scattered granulomata. Pancreas: Unremarkable. No pancreatic ductal  dilatation or surrounding inflammatory changes. Spleen: Scattered granulomata. Adrenals/Urinary Tract: Normal adrenal glands. Punctate nonobstructive bilateral renal calculi. No evidence of hydronephrosis. Left renal scarring. Left ureteral double-J stent stable with proximal and in the left renal pelvis and distal end within the urinary bladder. Stomach/Bowel: No evidence of small-bowel obstruction. Moderate amount of formed stool in the proximal colon. Ostomy in the left lower quadrant without evidence of parastomal herniation. Diffuse pericolonic inflammatory changes of the loops upstream to the ostomy. Postsurgical changes from resection of the rectum. Vascular/Lymphatic: Extensive aortic atherosclerosis. Known infrarenal abdominal aortic aneurysm is stable measuring 5.1 x 5.0 cm. No periaortic free fluid. No evidence of lymphadenopathy. Reproductive: Status post hysterectomy. No adnexal masses. Other: No abdominal wall hernia or abnormality. No abdominopelvic ascites. Musculoskeletal: Stable 4 mm anterolisthesis of L4 on L5. Spondylosis at L4-L5. IMPRESSION: Interval  development of mucosal thickening and pericolonic inflammatory changes of a long segment of the colon, upstream to the left lower quadrant colostomy. Moderate stool burden. Otherwise stable findings: Left double-J ureteral catheter. Known infrarenal abdominal aortic aneurysm, measuring 5.1 cm in greatest diameter. Marked atherosclerotic disease of the aorta. Electronically Signed   By: Fidela Salisbury M.D.   On: 08/14/2018 19:25   Dg Chest 2 View Result Date: 08/14/2018 CLINICAL DATA:  Vomiting and diarrhea. EXAM: CHEST - 2 VIEW COMPARISON:  11/21/2015 FINDINGS: The cardiac silhouette is normal. Apparent enlargement of the contour of the ascending aorta. Calcific atherosclerotic disease and tortuosity of the aorta. Thickening of the paratracheal stripe bilaterally. There is no evidence of focal airspace consolidation, pleural effusion or  pneumothorax. Osseous structures are without acute abnormality. Soft tissues are grossly normal. IMPRESSION: Apparent enlargement of the contour of the ascending aorta. Aneurysmal dilation cannot be excluded. Thickening of the paratracheal stripe bilaterally. This may represent enlargement of the thyroid gland or other space-occupying lesion within the mediastinum. Electronically Signed   By: Fidela Salisbury M.D.   On: 08/14/2018 18:30   Ct Head Wo Contrast Result Date: 08/14/2018 CLINICAL DATA:  Altered mental status EXAM: CT HEAD WITHOUT CONTRAST TECHNIQUE: Contiguous axial images were obtained from the base of the skull through the vertex without intravenous contrast. COMPARISON:  None. FINDINGS: Brain: Diffuse cerebral atrophy. Mild chronic small vessel disease. No acute intracranial abnormality. Specifically, no hemorrhage, hydrocephalus, mass lesion, acute infarction, or significant intracranial injury. Vascular: No hyperdense vessel or unexpected calcification. Skull: No acute calvarial abnormality. Sinuses/Orbits: No acute finding Other: None IMPRESSION: No acute intracranial abnormality. Atrophy, chronic microvascular disease. Electronically Signed   By: Rolm Baptise M.D.   On: 08/14/2018 19:13     2015:  Orthostatic on VS and BUN/Cr elevated from baseline; judicious IVF given. CT with colitis and Udip with +UTI (UC pending); IV abx started. No stool in colostomy bag to send for testing.  No vomiting while in the ED. Troponin mildly elevated, but pt denies CP and EKG unchanged from previous.  Dx and testing d/w pt and family.  Questions answered.  Verb understanding, agreeable to admit.  T/C returned from [facility] Dr. Maudie Mercury, case discussed, including:  HPI, pertinent PM/SHx, VS/PE, dx testing, ED course and treatment:  Agreeable to admit.        Final Clinical Impressions(s) / ED Diagnoses   Final diagnoses:  None    ED Discharge Orders    None       Francine Graven, DO 08/15/18  2333

## 2018-08-14 NOTE — H&P (Signed)
TRH H&P   Patient Demographics:    Sherry [name], is a 84 y.o. female  MRN: [id number]   DOB - 01/27/1923  Admit Date - 08/14/2018  Outpatient Primary MD for the patient is Marletta Lor, MD  Referring MD/NP/PA:   April Palumbo  Outpatient Specialists:   Patient coming from:  home  Chief Complaint  Patient presents with  . Emesis  . Diarrhea      HPI:    Sherry [name]  is a 84 y.o. female, w hypertension, hyperlipidemia, CAD, hypothyroidism, Copd, [name], has c/o diarrhea (mild), for the past several days as well as nuasea.  Pt denies fever, chills, abd pain, emesis, constipation, brbpr, black stool, dysuria, hematuria.    In ED, T 98, P 78-101, Bp 141/97  Pox 99% on RA  CT brain IMPRESSION: No acute intracranial abnormality.  CT abd/ pelvis IMPRESSION: Interval development of mucosal thickening and pericolonic inflammatory changes of a long segment of the colon, upstream to the left lower quadrant colostomy.  Moderate stool burden.  Otherwise stable findings:  Left double-J ureteral catheter.  Known infrarenal abdominal aortic aneurysm, measuring 5.1 cm in greatest diameter.  Marked atherosclerotic disease of the aorta.  Atrophy, chronic microvascular disease. Na 138, K 4.3, Bun 33, Creatinine 1.12 Alb 2.9 Ast 13, Alt 12,   Wbc 10,5, Hgb 12.9, Plt 239  Trop 0.04  EKG st at 100, nl axis, early R progression  Urinalysis ketones 20,  Wbc >50, rbc >50,    Pt will be admitted for AMS secondary to UTI, and nausea, diarrhea, secondary to ? Colitis and dehydration.      Review of systems:    In addition to the HPI above,  No Fever-chills, No Headache, No changes with Vision or hearing, No problems swallowing food or Liquids, No Chest pain, Cough or Shortness of Breath,  No Blood in stool or Urine, No dysuria, No new skin rashes or  bruises, No new joints pains-aches,  No new weakness, tingling, numbness in any extremity, No recent weight gain or loss, No polyuria, polydypsia or polyphagia, No significant Mental Stressors.  A full 10 point Review of Systems was done, except as stated above, all other Review of Systems were negative.   With Past History of the following :    Past Medical History:  Diagnosis Date  . Anemia   . Arthritis   . Bronchitis   . CAD (coronary artery disease)   . COPD (chronic obstructive pulmonary disease) ([place])   . Emphysema of lung ([place])   . GERD (gastroesophageal reflux disease)   . Headache(784.0)   . History of blood transfusion   . History of kidney stones   . Hyperlipidemia   . Hypertension   . Hypothyroidism   . Osteoporosis   . Rectal adenocarcinoma ([place]) dx'd 08/2009  . Rectal cancer ([place])   . Scoliosis  per Dr Melony Overly ortho  . Shingles   . Shortness of breath   . Tendon adhesions    torn tendon right shoulder      Past Surgical History:  Procedure Laterality Date  . APPENDECTOMY    . BALLOON DILATION Left 04/10/2017   Procedure: BALLOON DILATION OF LEFT URETERAL STRICTURE;  Surgeon: Cleon Gustin, MD;  Location: [facility];  Service: Urology;  Laterality: Left;  . CARDIAC CATHETERIZATION    . CHOLECYSTECTOMY    . COLON REMOVAL     8 " 12/2009 DR TSUEI  . COLON SURGERY     apr for rectal cancer 2011  . COLONOSCOPY    . COLOSTOMY    . CYSTOSCOPY W/ URETERAL STENT PLACEMENT Left 03/18/2017   Procedure: CYSTOSCOPY WITH RETROGRADE PYELOGRAM/URETERAL STENT PLACEMENT AND URETERAL DIAGNOSTIC LEFT;  Surgeon: Cleon Gustin, MD;  Location: [facility];  Service: Urology;  Laterality: Left;  1 HR [phone] [id number] A [phone] - pt to arrive at 7:00 to receive blood  . CYSTOSCOPY W/ URETERAL STENT PLACEMENT Left 04/10/2017   Procedure: CYSTOSCOPY WITH STENT REPLACEMENT;  Surgeon: Cleon Gustin, MD;  Location: [facility];  Service: Urology;   Laterality: Left;  . CYSTOSCOPY W/ URETERAL STENT PLACEMENT Left 06/03/2017   Procedure: CYSTOSCOPY WITH LEFT RETROGRADE PYELOGRAM/LEFT URETERAL  BALLOON DILATION AND LEFT URETERAL STENT PLACEMENT;  Surgeon: Cleon Gustin, MD;  Location: [facility];  Service: Urology;  Laterality: Left;  . CYSTOSCOPY W/ URETERAL STENT PLACEMENT Left 09/16/2017   Procedure: CYSTOSCOPY WITH LEFT STENT EXCHANGE/ RETROGRADE;  Surgeon: Cleon Gustin, MD;  Location: [facility];  Service: Urology;  Laterality: Left;  NEEDS TOTAL 30 MIN  . CYSTOSCOPY W/ URETERAL STENT PLACEMENT Left 02/12/2018   Procedure: CYSTOSCOPY WITH RETROGRADE PYELOGRAM/URETERAL STENT EXCHANGE/HOLMIUM LASER;  Surgeon: Cleon Gustin, MD;  Location: [facility];  Service: Urology;  Laterality: Left;  . CYSTOSCOPY W/ URETERAL STENT PLACEMENT Left 04/30/2018   Procedure: CYSTOSCOPY WITH LEFT RETROGRADE PYELOGRAM/LEFT URETERAL STENT EXCHANGE;  Surgeon: Cleon Gustin, MD;  Location: [facility];  Service: Urology;  Laterality: Left;  . CYSTOSCOPY W/ URETERAL STENT PLACEMENT Left 07/09/2018   Procedure: CYSTOSCOPY WITH LEFT RETROGRADE PYELOGRAM/LEFT URETERAL STENT EXCHANGE;  Surgeon: Cleon Gustin, MD;  [facility]: [facility];  Service: Urology;  Laterality: Left;  . CYSTOSCOPY WITH HOLMIUM LASER LITHOTRIPSY  07/09/2018   Procedure: CYSTOSCOPY WITH HOLMIUM LASER LITHOTRIPSY BLADDER CALCULUS;  Surgeon: Cleon Gustin, MD;  Location: [facility];  Service: Urology;;  . Consuela Mimes WITH RETROGRADE PYELOGRAM, URETEROSCOPY AND STENT PLACEMENT Left 03/29/2016   Procedure: CYSTOSCOPY WITH RETROGRADE PYELOGRAM, URETEROSCOPY AND STENT PLACEMENT;  Surgeon: Kathie Rhodes, MD;  Location: [facility];  Service: Urology;  Laterality: Left;  With Laser  . CYSTOSCOPY/RETROGRADE/URETEROSCOPY Left 01/28/2017   Procedure: CYSTOSCOPY/RETROGRADE/URETEROSCOPY (FLEX 6 fr) THULIUM LASER OF TUMOR;  Surgeon: Franchot Gallo, MD;  Location: [facility];  Service: Urology;  Laterality: Left;  .  CYSTOSCOPY/RETROGRADE/URETEROSCOPY Left 04/10/2017   Procedure: CYSTOSCOPY, LEFT RETROGRADE PYELOGRAM LEFT URETEROSCOPY;  Surgeon: Cleon Gustin, MD;  Location: [facility];  Service: Urology;  Laterality: Left;  . EYE SURGERY     Cataract with lens- bil  . HOLMIUM LASER APPLICATION Left 3/81/0175   Procedure: HOLMIUM LASER ABLATION LEFT URETERAL TUMOR;  Surgeon: Cleon Gustin, MD;  Location: [facility];  Service: Urology;  Laterality: Left;  . HOLMIUM LASER APPLICATION Left 12/18/5850   Procedure: HOLMIUM LASER LITHOTRIPSY BLADDER CALCULUS (LEFT ENCRUSTED URETERAL STENT);  Surgeon: Cleon Gustin, MD;  Location:  [facility];  Service: Urology;  Laterality: Left;  . OOPHORECTOMY    . ovary tumor removal     after son was born  . PARASTOMAL HERNIA REPAIR  10/30/2012  . POLYPECTOMY    . RECTUM REMOVAL     1/11 DR TSUEI  . TRANSURETHRAL RESECTION OF BLADDER TUMOR N/A 10/21/2017   Procedure: TRANSURETHRAL RESECTION OF BLADDER TUMOR (TURBT);  Surgeon: Cleon Gustin, MD;  Location: [facility];  Service: Urology;  Laterality: N/A;  . TRANSURETHRAL RESECTION OF BLADDER TUMOR N/A 02/12/2018   Procedure: TRANSURETHRAL RESECTION OF BLADDER TUMOR (TURBT);  Surgeon: Cleon Gustin, MD;  Location: [facility];  Service: Urology;  Laterality: N/A;  . TUBAL LIGATION    . VAGINAL HYSTERECTOMY    . VENTRAL HERNIA REPAIR  10/30/2012   Procedure: LAPAROSCOPIC VENTRAL HERNIA;  Surgeon: Imogene Burn. Georgette Dover, MD;  Location: [facility];  Service: General;  Laterality: N/A;  Laparoscopic repair of parastomal hernia      Social History:     Social History   Tobacco Use  . Smoking status: Former Smoker    Packs/day: 1.50    Years: 65.00    Pack years: 97.50    Types: Cigarettes    Last attempt to quit: 12/18/2003    Years since quitting: 14.6  . Smokeless tobacco: Never Used  . Tobacco comment: quit 2005  Substance Use Topics  . Alcohol use: No    Alcohol/week: 0.0 standard drinks     Lives - at  home  Mobility - walks by self   Family History :     Family History  Problem Relation Age of Onset  . Heart failure Sister   . Cervical cancer Sister   . Heart failure Brother   . Colon cancer Brother   . Colon cancer Brother   . Heart failure Father   . Cancer Mother        HEAD AND NECK  . Cervical cancer Daughter   . Kidney cancer Daughter        Home Medications:   Prior to Admission medications   Medication Sig Start Date End Date Taking? Authorizing Provider  acetaminophen (TYLENOL) 650 MG CR tablet Take 1,300 mg by mouth daily as needed for pain.   Yes [provider]  atorvastatin (LIPITOR) 10 MG tablet TAKE 1 TABLET DAILY 11/06/17  Yes Marletta Lor, MD  diphenoxylate-atropine (LOMOTIL) 2.5-0.025 MG tablet Take 1 tablet by mouth 4 (four) times daily as needed for diarrhea or loose stools. 08/13/18  Yes Marletta Lor, MD  levothyroxine (SYNTHROID, LEVOTHROID) 100 MCG tablet Take 100 mcg by mouth daily before breakfast.   Yes [provider]  ondansetron (ZOFRAN) 4 MG tablet Take 1 tablet (4 mg total) by mouth every 8 (eight) hours as needed for nausea or vomiting. 08/13/18  Yes Marletta Lor, MD  PROAIR HFA 108 [phone] Base) MCG/ACT inhaler USE 2 INHALATIONS INTO THE LUNGS EVERY 4 HOURS AS NEEDED FOR SHORTNESS OF BREATH Patient taking differently: Inhale 2 puffs into the lungs every 4 (four) hours as needed for wheezing or shortness of breath.  02/14/16  Yes Marletta Lor, MD  TOPROL XL 25 MG 24 hr tablet TAKE ONE-HALF (1/2) TABLET DAILY Patient taking differently: Take 12.5 mg by mouth every evening.  10/22/17  Yes Marletta Lor, MD  traMADol-acetaminophen (ULTRACET) 37.5-325 MG tablet Take 1 tablet by mouth every 6 (six) hours as needed for moderate pain. 07/09/18  Yes McKenzie, Candee Furbish,  MD  Trospium Chloride 60 MG CP24 Take 1 capsule by mouth daily.   Yes [provider]  levothyroxine (SYNTHROID, LEVOTHROID) 75  MCG tablet Take 1 tablet (75 mcg total) by mouth daily. Patient not taking: Reported on 08/14/2018 08/13/18   Marletta Lor, MD     Allergies:     Allergies  Allergen Reactions  . Percocet [Oxycodone-Acetaminophen] Nausea And Vomiting  . Influenza Virus Vacc Split Pf Other (See Comments)    Unknown  But had to be hospitalized -1966  . Sulfamethoxazole-Trimethoprim Nausea And Vomiting  . Hibiclens [Chlorhexidine Gluconate] Rash     Physical Exam:   Vitals  Blood pressure (!) 141/97, pulse 78, temperature 98 F (36.7 C), temperature source Oral, resp. rate (!) 27, height 5' 2.5" (1.588 m), weight 44.9 kg, SpO2 99 %.   1. General lying in bed in NAD,    2. Normal affect and insight, Not Suicidal or Homicidal, Awake Alert, Oriented X 3.  3. No F.N deficits, ALL C.Nerves Intact, Strength 5/5 all 4 extremities, Sensation intact all 4 extremities, Plantars down going.  4. Ears and Eyes appear Normal, Conjunctivae clear, PERRLA. Moist Oral Mucosa.  5. Supple Neck, No JVD, No cervical lymphadenopathy appriciated, No Carotid Bruits.  6. Symmetrical Chest wall movement, Good air movement bilaterally, CTAB.  7. RRR, No Gallops, Rubs or Murmurs, No Parasternal Heave.  8. Positive Bowel Sounds, Abdomen Soft, No tenderness, No organomegaly appriciated,No rebound -guarding or rigidity.  9.  No Cyanosis, Normal Skin Turgor, No Skin Rash or Bruise.  10. Good muscle tone,  joints appear normal , no effusions, Normal ROM.  11. No Palpable Lymph Nodes in Neck or Axillae      Data Review:    CBC Recent Labs  Lab 08/12/18 1635 08/14/18 1821  WBC 11.9* 10.5  HGB 12.3 12.9  HCT 37.7 40.1  PLT 268.0 239  MCV 91.8 93.0  MCH  --  29.9  MCHC 32.6 32.2  RDW 17.5* 15.5  LYMPHSABS 0.5* 0.4*  MONOABS 1.0 0.6  EOSABS 0.1 0.1  BASOSABS 0.0 0.0   ------------------------------------------------------------------------------------------------------------------  Chemistries   Recent Labs  Lab 08/12/18 1635 08/14/18 1821  NA 136 138  K 4.9 4.3  CL 100 100  CO2 27 26  GLUCOSE 140* 80  BUN 25* 33*  CREATININE 0.91 1.12*  CALCIUM 10.1 10.2  AST 10 13*  ALT 8 12  ALKPHOS 81 78  BILITOT 0.7 1.2   ------------------------------------------------------------------------------------------------------------------ estimated creatinine clearance is 22.7 mL/min (A) (by C-G formula based on SCr of 1.12 mg/dL (H)). ------------------------------------------------------------------------------------------------------------------ Recent Labs    08/12/18 1635  TSH 0.09*    Coagulation profile No results for input(s): INR, PROTIME in the last 168 hours. ------------------------------------------------------------------------------------------------------------------- No results for input(s): DDIMER in the last 72 hours. -------------------------------------------------------------------------------------------------------------------  Cardiac Enzymes Recent Labs  Lab 08/14/18 1821  TROPONINI 0.04*   ------------------------------------------------------------------------------------------------------------------ No results found for: BNP   ---------------------------------------------------------------------------------------------------------------  Urinalysis    Component Value Date/Time   COLORURINE AMBER (A) 08/14/2018 1944   APPEARANCEUR CLOUDY (A) 08/14/2018 1944   LABSPEC 1.018 08/14/2018 1944   LABSPEC 1.030 12/06/2009 1628   PHURINE 5.0 08/14/2018 1944   GLUCOSEU NEGATIVE 08/14/2018 1944   HGBUR MODERATE (A) 08/14/2018 1944   BILIRUBINUR NEGATIVE 08/14/2018 1944   BILIRUBINUR 1+ 02/10/2016 1638   BILIRUBINUR Negative 12/06/2009 1628   KETONESUR 20 (A) 08/14/2018 1944   PROTEINUR 100 (A) 08/14/2018 1944   UROBILINOGEN 1.0 02/10/2016 1638   UROBILINOGEN 1.0 04/01/2010 1500  NITRITE POSITIVE (A) 08/14/2018 1944   LEUKOCYTESUR LARGE (A)  08/14/2018 1944   LEUKOCYTESUR Small 12/06/2009 1628    ----------------------------------------------------------------------------------------------------------------   Imaging Results:    Ct Abdomen Pelvis Wo Contrast  Result Date: 08/14/2018 CLINICAL DATA:  Vomiting and diarrhea for several days. EXAM: CT ABDOMEN AND PELVIS WITHOUT CONTRAST TECHNIQUE: Multidetector CT imaging of the abdomen and pelvis was performed following the standard protocol without IV contrast. COMPARISON:  08/06/2018 FINDINGS: Lower chest: Tortuosity and calcific atherosclerotic disease of the aorta. Calcific atherosclerotic disease of the coronary arteries. Mitral valve annular calcifications. Hepatobiliary: 6 mm hypoattenuated focus in the dome of the liver, too small to be actually characterize by CT. Scattered granulomata. Pancreas: Unremarkable. No pancreatic ductal dilatation or surrounding inflammatory changes. Spleen: Scattered granulomata. Adrenals/Urinary Tract: Normal adrenal glands. Punctate nonobstructive bilateral renal calculi. No evidence of hydronephrosis. Left renal scarring. Left ureteral double-J stent stable with proximal and in the left renal pelvis and distal end within the urinary bladder. Stomach/Bowel: No evidence of small-bowel obstruction. Moderate amount of formed stool in the proximal colon. Ostomy in the left lower quadrant without evidence of parastomal herniation. Diffuse pericolonic inflammatory changes of the loops upstream to the ostomy. Postsurgical changes from resection of the rectum. Vascular/Lymphatic: Extensive aortic atherosclerosis. Known infrarenal abdominal aortic aneurysm is stable measuring 5.1 x 5.0 cm. No periaortic free fluid. No evidence of lymphadenopathy. Reproductive: Status post hysterectomy. No adnexal masses. Other: No abdominal wall hernia or abnormality. No abdominopelvic ascites. Musculoskeletal: Stable 4 mm anterolisthesis of L4 on L5. Spondylosis at L4-L5.  IMPRESSION: Interval development of mucosal thickening and pericolonic inflammatory changes of a long segment of the colon, upstream to the left lower quadrant colostomy. Moderate stool burden. Otherwise stable findings: Left double-J ureteral catheter. Known infrarenal abdominal aortic aneurysm, measuring 5.1 cm in greatest diameter. Marked atherosclerotic disease of the aorta. Electronically Signed   By: Fidela Salisbury M.D.   On: 08/14/2018 19:25   Dg Chest 2 View  Result Date: 08/14/2018 CLINICAL DATA:  Vomiting and diarrhea. EXAM: CHEST - 2 VIEW COMPARISON:  11/21/2015 FINDINGS: The cardiac silhouette is normal. Apparent enlargement of the contour of the ascending aorta. Calcific atherosclerotic disease and tortuosity of the aorta. Thickening of the paratracheal stripe bilaterally. There is no evidence of focal airspace consolidation, pleural effusion or pneumothorax. Osseous structures are without acute abnormality. Soft tissues are grossly normal. IMPRESSION: Apparent enlargement of the contour of the ascending aorta. Aneurysmal dilation cannot be excluded. Thickening of the paratracheal stripe bilaterally. This may represent enlargement of the thyroid gland or other space-occupying lesion within the mediastinum. Electronically Signed   By: Fidela Salisbury M.D.   On: 08/14/2018 18:30   Ct Head Wo Contrast  Result Date: 08/14/2018 CLINICAL DATA:  Altered mental status EXAM: CT HEAD WITHOUT CONTRAST TECHNIQUE: Contiguous axial images were obtained from the base of the skull through the vertex without intravenous contrast. COMPARISON:  None. FINDINGS: Brain: Diffuse cerebral atrophy. Mild chronic small vessel disease. No acute intracranial abnormality. Specifically, no hemorrhage, hydrocephalus, mass lesion, acute infarction, or significant intracranial injury. Vascular: No hyperdense vessel or unexpected calcification. Skull: No acute calvarial abnormality. Sinuses/Orbits: No acute finding  Other: None IMPRESSION: No acute intracranial abnormality. Atrophy, chronic microvascular disease. Electronically Signed   By: Rolm Baptise M.D.   On: 08/14/2018 19:13      Assessment & Plan:    Active Problems:   Diarrhea    Diarrhea, ? Colitis Check gi pathogen panel Check c. Diff Cipro iv  Pharmacy to dose Flagyl 500mg  iv tid  Nausea  Zofran 4mg  iv q6h prn   UTI Urine culture pending Blood culture pending  CAD Cont Toprol XL 25mg  1/2 po qday Cont Lipitor 10mg  po qhs Unclear why not on aspirin  Hypothyroidism Cont levothyroxine  Urinary incontinence Cont Trospium=> Enablex  Troponin elevation Tele Trop I q6h  Check cardiac echo       DVT Prophylaxis  Lovenox - SCDs  AM Labs Ordered, also please review Full Orders  Family Communication: Admission, patients condition and plan of care including tests being ordered have been discussed with the patient  who indicate understanding and agree with the plan and Code Status.  Code Status  FULL CODE  Likely DC to  home  Condition GUARDED    Consults called:   none  Admission status: observation, pt has dehydration that will will require hospitalization for IVF as well as IV abx for ? Colitis.  If pt slow to improve might need inpatient stay in particular if her mental status and troponin elevation worsens  Time spent in minutes : 70   [name] M.D on 08/14/2018 at 9:50 PM  Between 7am to 7pm - Pager - [phone]   After 7pm go to [email or web address] - password [facility]  [facility] - Office  [phone]

## 2018-08-14 NOTE — ED Notes (Signed)
ED TO INPATIENT HANDOFF REPORT  Name/Age/Gender Sherry [name] 84 y.o. female  Code Status Code Status History    Date Active Date Inactive Code Status Order ID Comments User Context   01/28/2017 1036 01/31/2017 1538 Full Code [id number]  Franchot Gallo, MD Inpatient    Advance Directive Documentation     Most Recent Value  Type of Advance Directive  Healthcare Power of Attorney  Pre-existing out of facility DNR order (yellow form or pink MOST form)  -  "MOST" Form in Place?  -      Home/SNF/Other Home (lives with daughter)  Chief Complaint Dehydration, Emesis, Diarrhea, sent by dr   Level of Care/Admitting Diagnosis ED Disposition    ED Disposition Condition [place]: [place] [[id number]]  Level of Care: Telemetry [5]  Admit to tele based on following criteria: Monitor for Ischemic changes  Diagnosis: Diarrhea [787.91.ICD-9-CM]  Admitting Physician: [name] [[id number]]  Attending Physician: [name] [[id number]]  PT Class (Do Not Modify): Observation [104]  PT Acc Code (Do Not Modify): Observation [10022]       Medical History Past Medical History:  Diagnosis Date  . Anemia   . Arthritis   . Bronchitis   . CAD (coronary artery disease)   . COPD (chronic obstructive pulmonary disease) ([place])   . Emphysema of lung ([place])   . GERD (gastroesophageal reflux disease)   . Headache(784.0)   . History of blood transfusion   . History of kidney stones   . Hyperlipidemia   . Hypertension   . Hypothyroidism   . Osteoporosis   . Rectal adenocarcinoma ([place]) dx'd 08/2009  . Rectal cancer ([place])   . Scoliosis    per Dr Melony Overly ortho  . Shingles   . Shortness of breath   . Tendon adhesions    torn tendon right shoulder    Allergies Allergies  Allergen Reactions  . Percocet [Oxycodone-Acetaminophen] Nausea And Vomiting  . Influenza Virus Vacc Split Pf Other (See Comments)    Unknown  But had to be hospitalized -1966  .  Sulfamethoxazole-Trimethoprim Nausea And Vomiting  . Hibiclens [Chlorhexidine Gluconate] Rash    IV Location/Drains/Wounds Patient Lines/Drains/Airways Status   Active Line/Drains/Airways    Name:   Placement date:   Placement time:   Site:   Days:   Peripheral IV 04/30/18 Right Forearm   04/30/18    1054    Forearm   106   Peripheral IV 08/14/18 Left Antecubital   08/14/18    1822    Antecubital   less than 1   Colostomy LLQ   -    -    LLQ      Urethral Catheter Dr.McKenzie Latex;Triple-lumen 22 Fr.   10/21/17    1210    Latex;Triple-lumen   297   Urethral Catheter DR. McKenzie Latex;Triple-lumen 22 Fr.   02/12/18    1340    Latex;Triple-lumen   183   Ureteral Drain/Stent Left ureter 7 Fr.   04/10/17    1409    Left ureter   491   Ureteral Drain/Stent Left ureter 7 Fr.   06/03/17    1337    Left ureter   437   Ureteral Drain/Stent Left ureter 7 Fr.   07/09/18    1301    Left ureter   36   Airway   07/09/18    1221     36   Incision 10/30/12 Abdomen Other (Comment)  10/30/12    0808     2114   Incision (Closed) 03/18/17 Perineum   03/18/17    1201     514   Incision (Closed) 04/10/17 Vagina Other (Comment)   04/10/17    1437     491   Incision (Closed) 06/03/17 Perineum Other (Comment)   06/03/17    1405     437   Incision (Closed) 09/16/17 Other (Comment)   09/16/17    0757     332   Incision (Closed) 10/21/17 Perineum Other (Comment)   10/21/17    1224     297   Incision (Closed) 04/30/18 Perineum Other (Comment)   04/30/18    1339     106   Incision (Closed) 07/09/18 Perineum Other (Comment)   07/09/18    1319     36   Incision - 3 Ports Abdomen 1: Right;Upper 2: Mid 3: Lower   10/30/12    -     2114          Labs/Imaging Results for orders placed or performed during the hospital encounter of 08/14/18 (from the past 48 hour(s))  Lipase, blood     Status: None   Collection Time: 08/14/18  6:21 PM  Result Value Ref Range   Lipase 22 11 - 51 U/L    Comment: Performed at [facility], [place] [address], [place], [place] [address]  Comprehensive metabolic panel     Status: Abnormal   Collection Time: 08/14/18  6:21 PM  Result Value Ref Range   Sodium 138 135 - 145 mmol/L   Potassium 4.3 3.5 - 5.1 mmol/L   Chloride 100 98 - 111 mmol/L   CO2 26 22 - 32 mmol/L   Glucose, Bld 80 70 - 99 mg/dL   BUN 33 (H) 8 - 23 mg/dL   Creatinine, Ser 1.12 (H) 0.44 - 1.00 mg/dL   Calcium 10.2 8.9 - 10.3 mg/dL   Total Protein 5.9 (L) 6.5 - 8.1 g/dL   Albumin 2.9 (L) 3.5 - 5.0 g/dL   AST 13 (L) 15 - 41 U/L   ALT 12 0 - 44 U/L   Alkaline Phosphatase 78 38 - 126 U/L   Total Bilirubin 1.2 0.3 - 1.2 mg/dL   GFR calc non Af Amer 41 (L) >60 mL/min   GFR calc Af Amer 48 (L) >60 mL/min    Comment: (NOTE) The eGFR has been calculated using the CKD EPI equation. This calculation has not been validated in all clinical situations. eGFR's persistently <60 mL/min signify possible Chronic Kidney Disease.    Anion gap 12 5 - 15    Comment: Performed at [facility], [place] [address] [place], [place] [address]  CBC with Differential     Status: Abnormal   Collection Time: 08/14/18  6:21 PM  Result Value Ref Range   WBC 10.5 4.0 - 10.5 K/uL   RBC 4.31 3.87 - 5.11 MIL/uL   Hemoglobin 12.9 12.0 - 15.0 g/dL   HCT 40.1 36.0 - 46.0 %   MCV 93.0 78.0 - 100.0 fL   MCH 29.9 26.0 - 34.0 pg   MCHC 32.2 30.0 - 36.0 g/dL   RDW 15.5 11.5 - 15.5 %   Platelets 239 150 - 400 K/uL   Neutrophils Relative % 89 %   Neutro Abs 9.4 (H) 1.7 - 7.7 K/uL   Lymphocytes Relative 4 %   Lymphs Abs 0.4 (L) 0.7 - 4.0  K/uL   Monocytes Relative 6 %   Monocytes Absolute 0.6 0.1 - 1.0 K/uL   Eosinophils Relative 1 %   Eosinophils Absolute 0.1 0.0 - 0.7 K/uL   Basophils Relative 0 %   Basophils Absolute 0.0 0.0 - 0.1 K/uL    Comment: Performed at [facility], [place] [address], [place], [place] [address]  Troponin I     Status: Abnormal   Collection Time: 08/14/18   6:21 PM  Result Value Ref Range   Troponin I 0.04 (HH) <0.03 ng/mL    Comment: CRITICAL RESULT CALLED TO, READ BACK BY AND VERIFIED WITH: Geryl Councilman. RN '@[email or web address]'  ON 08.29.19 BY COHEN,K Performed at [facility], [place] [address] [place], [place] [address]   Lactic acid, plasma     Status: None   Collection Time: 08/14/18  6:21 PM  Result Value Ref Range   Lactic Acid, Venous 0.9 0.5 - 1.9 mmol/L    Comment: Performed at [facility], [place] [address] [place], [place] [address]  Urinalysis, Routine w reflex microscopic     Status: Abnormal   Collection Time: 08/14/18  7:44 PM  Result Value Ref Range   Color, Urine AMBER (A) YELLOW    Comment: BIOCHEMICALS MAY BE AFFECTED BY COLOR   APPearance CLOUDY (A) CLEAR   Specific Gravity, Urine 1.018 1.005 - 1.030   pH 5.0 5.0 - 8.0   Glucose, UA NEGATIVE NEGATIVE mg/dL   Hgb urine dipstick MODERATE (A) NEGATIVE   Bilirubin Urine NEGATIVE NEGATIVE   Ketones, ur 20 (A) NEGATIVE mg/dL   Protein, ur 100 (A) NEGATIVE mg/dL   Nitrite POSITIVE (A) NEGATIVE   Leukocytes, UA LARGE (A) NEGATIVE   RBC / HPF >50 (H) 0 - 5 RBC/hpf   WBC, UA >50 (H) 0 - 5 WBC/hpf   Bacteria, UA FEW (A) NONE SEEN   Squamous Epithelial / LPF 11-20 0 - 5   WBC Clumps PRESENT    Mucus PRESENT    Non Squamous Epithelial 0-5 (A) NONE SEEN    Comment: Performed at [facility], [place] [address] [place], [place] [address]   Ct Abdomen Pelvis Wo Contrast  Result Date: 08/14/2018 CLINICAL DATA:  Vomiting and diarrhea for several days. EXAM: CT ABDOMEN AND PELVIS WITHOUT CONTRAST TECHNIQUE: Multidetector CT imaging of the abdomen and pelvis was performed following the standard protocol without IV contrast. COMPARISON:  08/06/2018 FINDINGS: Lower chest: Tortuosity and calcific atherosclerotic disease of the aorta. Calcific atherosclerotic disease of the coronary arteries. Mitral valve annular calcifications. Hepatobiliary: 6 mm  hypoattenuated focus in the dome of the liver, too small to be actually characterize by CT. Scattered granulomata. Pancreas: Unremarkable. No pancreatic ductal dilatation or surrounding inflammatory changes. Spleen: Scattered granulomata. Adrenals/Urinary Tract: Normal adrenal glands. Punctate nonobstructive bilateral renal calculi. No evidence of hydronephrosis. Left renal scarring. Left ureteral double-J stent stable with proximal and in the left renal pelvis and distal end within the urinary bladder. Stomach/Bowel: No evidence of small-bowel obstruction. Moderate amount of formed stool in the proximal colon. Ostomy in the left lower quadrant without evidence of parastomal herniation. Diffuse pericolonic inflammatory changes of the loops upstream to the ostomy. Postsurgical changes from resection of the rectum. Vascular/Lymphatic: Extensive aortic atherosclerosis. Known infrarenal abdominal aortic aneurysm is stable measuring 5.1 x 5.0 cm. No periaortic free fluid. No evidence of lymphadenopathy. Reproductive: Status post hysterectomy. No adnexal masses. Other: No abdominal wall hernia or abnormality. No abdominopelvic ascites. Musculoskeletal: Stable 4 mm  anterolisthesis of L4 on L5. Spondylosis at L4-L5. IMPRESSION: Interval development of mucosal thickening and pericolonic inflammatory changes of a long segment of the colon, upstream to the left lower quadrant colostomy. Moderate stool burden. Otherwise stable findings: Left double-J ureteral catheter. Known infrarenal abdominal aortic aneurysm, measuring 5.1 cm in greatest diameter. Marked atherosclerotic disease of the aorta. Electronically Signed   By: Fidela Salisbury M.D.   On: 08/14/2018 19:25   Dg Chest 2 View  Result Date: 08/14/2018 CLINICAL DATA:  Vomiting and diarrhea. EXAM: CHEST - 2 VIEW COMPARISON:  11/21/2015 FINDINGS: The cardiac silhouette is normal. Apparent enlargement of the contour of the ascending aorta. Calcific atherosclerotic  disease and tortuosity of the aorta. Thickening of the paratracheal stripe bilaterally. There is no evidence of focal airspace consolidation, pleural effusion or pneumothorax. Osseous structures are without acute abnormality. Soft tissues are grossly normal. IMPRESSION: Apparent enlargement of the contour of the ascending aorta. Aneurysmal dilation cannot be excluded. Thickening of the paratracheal stripe bilaterally. This may represent enlargement of the thyroid gland or other space-occupying lesion within the mediastinum. Electronically Signed   By: Fidela Salisbury M.D.   On: 08/14/2018 18:30   Ct Head Wo Contrast  Result Date: 08/14/2018 CLINICAL DATA:  Altered mental status EXAM: CT HEAD WITHOUT CONTRAST TECHNIQUE: Contiguous axial images were obtained from the base of the skull through the vertex without intravenous contrast. COMPARISON:  None. FINDINGS: Brain: Diffuse cerebral atrophy. Mild chronic small vessel disease. No acute intracranial abnormality. Specifically, no hemorrhage, hydrocephalus, mass lesion, acute infarction, or significant intracranial injury. Vascular: No hyperdense vessel or unexpected calcification. Skull: No acute calvarial abnormality. Sinuses/Orbits: No acute finding Other: None IMPRESSION: No acute intracranial abnormality. Atrophy, chronic microvascular disease. Electronically Signed   By: Rolm Baptise M.D.   On: 08/14/2018 19:13    Pending Labs Unresulted Labs (From admission, onward)    Start     Ordered   08/14/18 1656  C difficile quick scan w PCR reflex  (C Difficile quick screen w PCR reflex panel)  Once, for 24 hours,   R     08/14/18 1656   08/14/18 1656  Gastrointestinal Panel by PCR , Stool  (Gastrointestinal Panel by PCR, Stool)  Once,   R     08/14/18 1656   08/14/18 1653  Lactic acid, plasma  Now then every 2 hours,   STAT     08/14/18 1652   08/14/18 1653  Urine culture  STAT,   STAT     08/14/18 1653          Vitals/Pain Today's Vitals    08/14/18 1618 08/14/18 1815 08/14/18 2007 08/14/18 2009  BP:   137/82   Pulse:  98 (!) 101   Resp:  20 (!) 27   Temp:      TempSrc:      SpO2:  99% 100%   Weight: 44.9 kg     Height: 5' 2.5" (1.588 m)     PainSc:    0-No pain    Isolation Precautions Enteric precautions (UV disinfection)  Medications Medications  0.9 %  sodium chloride infusion ( Intravenous New Bag/Given 08/14/18 1916)  ciprofloxacin (CIPRO) IVPB 400 mg (400 mg Intravenous New Bag/Given 08/14/18 2005)  metroNIDAZOLE (FLAGYL) IVPB 500 mg (has no administration in time range)  sodium chloride 0.9 % bolus 250 mL (250 mLs Intravenous New Bag/Given 08/14/18 1822)    Mobility walks with device (cane/walker)

## 2018-08-14 NOTE — Telephone Encounter (Signed)
Noted  

## 2018-08-14 NOTE — ED Notes (Signed)
ED Provider at bedside. 

## 2018-08-14 NOTE — ED Notes (Signed)
Attempted to collect stool from colostomy bag. It was empty but pt will let me know when there is some for me to collect

## 2018-08-14 NOTE — Progress Notes (Signed)
Pharmacy Antibiotic Note  Sherry [name] is a 84 y.o. female admitted on 08/14/2018 with Intra-abdominal infection.  Pharmacy has been consulted for Ciprofloxacin dosing.  Plan: Ciprofloxacin 400mg  iv q24hr  Height: 5' 2.5" (158.8 cm) Weight: 99 lb (44.9 kg) IBW/kg (Calculated) : 51.25  Temp (24hrs), Avg:97.9 F (36.6 C), Min:97.8 F (36.6 C), Max:98 F (36.7 C)  Recent Labs  Lab 08/12/18 1635 08/14/18 1821  WBC 11.9* 10.5  CREATININE 0.91 1.12*  LATICACIDVEN  --  0.9    Estimated Creatinine Clearance: 22.7 mL/min (A) (by C-G formula based on SCr of 1.12 mg/dL (H)).    Allergies  Allergen Reactions  . Percocet [Oxycodone-Acetaminophen] Nausea And Vomiting  . Influenza Virus Vacc Split Pf Other (See Comments)    Unknown  But had to be hospitalized -1966  . Sulfamethoxazole-Trimethoprim Nausea And Vomiting  . Hibiclens [Chlorhexidine Gluconate] Rash    Antimicrobials this admission: Ciprofloxacin 08/14/2018 >> Flagyl   08/14/2018 >>  Dose adjustments this admission: -  Microbiology results: -  Thank you for allowing pharmacy to be a part of this patient's care.  Nani Skillern Crowford 08/14/2018 10:47 PM

## 2018-08-15 ENCOUNTER — Inpatient Hospital Stay (HOSPITAL_COMMUNITY): Payer: Medicare Other

## 2018-08-15 DIAGNOSIS — R110 Nausea: Secondary | ICD-10-CM | POA: Diagnosis not present

## 2018-08-15 DIAGNOSIS — C20 Malignant neoplasm of rectum: Secondary | ICD-10-CM | POA: Diagnosis present

## 2018-08-15 DIAGNOSIS — R112 Nausea with vomiting, unspecified: Secondary | ICD-10-CM | POA: Diagnosis not present

## 2018-08-15 DIAGNOSIS — R2689 Other abnormalities of gait and mobility: Secondary | ICD-10-CM | POA: Diagnosis not present

## 2018-08-15 DIAGNOSIS — Z8719 Personal history of other diseases of the digestive system: Secondary | ICD-10-CM | POA: Diagnosis not present

## 2018-08-15 DIAGNOSIS — I361 Nonrheumatic tricuspid (valve) insufficiency: Secondary | ICD-10-CM

## 2018-08-15 DIAGNOSIS — K56609 Unspecified intestinal obstruction, unspecified as to partial versus complete obstruction: Secondary | ICD-10-CM | POA: Diagnosis not present

## 2018-08-15 DIAGNOSIS — N179 Acute kidney failure, unspecified: Secondary | ICD-10-CM

## 2018-08-15 DIAGNOSIS — K219 Gastro-esophageal reflux disease without esophagitis: Secondary | ICD-10-CM | POA: Diagnosis present

## 2018-08-15 DIAGNOSIS — B9620 Unspecified Escherichia coli [E. coli] as the cause of diseases classified elsewhere: Secondary | ICD-10-CM | POA: Diagnosis present

## 2018-08-15 DIAGNOSIS — R404 Transient alteration of awareness: Secondary | ICD-10-CM | POA: Diagnosis not present

## 2018-08-15 DIAGNOSIS — M810 Age-related osteoporosis without current pathological fracture: Secondary | ICD-10-CM | POA: Diagnosis present

## 2018-08-15 DIAGNOSIS — I10 Essential (primary) hypertension: Secondary | ICD-10-CM | POA: Diagnosis not present

## 2018-08-15 DIAGNOSIS — I700 Atherosclerosis of aorta: Secondary | ICD-10-CM | POA: Diagnosis present

## 2018-08-15 DIAGNOSIS — M2550 Pain in unspecified joint: Secondary | ICD-10-CM | POA: Diagnosis not present

## 2018-08-15 DIAGNOSIS — L8990 Pressure ulcer of unspecified site, unspecified stage: Secondary | ICD-10-CM | POA: Diagnosis not present

## 2018-08-15 DIAGNOSIS — Z85048 Personal history of other malignant neoplasm of rectum, rectosigmoid junction, and anus: Secondary | ICD-10-CM | POA: Diagnosis not present

## 2018-08-15 DIAGNOSIS — K567 Ileus, unspecified: Secondary | ICD-10-CM | POA: Diagnosis present

## 2018-08-15 DIAGNOSIS — R278 Other lack of coordination: Secondary | ICD-10-CM | POA: Diagnosis not present

## 2018-08-15 DIAGNOSIS — F0390 Unspecified dementia without behavioral disturbance: Secondary | ICD-10-CM | POA: Diagnosis not present

## 2018-08-15 DIAGNOSIS — Z681 Body mass index (BMI) 19 or less, adult: Secondary | ICD-10-CM | POA: Diagnosis not present

## 2018-08-15 DIAGNOSIS — K529 Noninfective gastroenteritis and colitis, unspecified: Secondary | ICD-10-CM | POA: Diagnosis not present

## 2018-08-15 DIAGNOSIS — N390 Urinary tract infection, site not specified: Secondary | ICD-10-CM

## 2018-08-15 DIAGNOSIS — E860 Dehydration: Secondary | ICD-10-CM | POA: Diagnosis present

## 2018-08-15 DIAGNOSIS — Z7401 Bed confinement status: Secondary | ICD-10-CM | POA: Diagnosis not present

## 2018-08-15 DIAGNOSIS — R5381 Other malaise: Secondary | ICD-10-CM | POA: Diagnosis not present

## 2018-08-15 DIAGNOSIS — K435 Parastomal hernia without obstruction or  gangrene: Secondary | ICD-10-CM | POA: Diagnosis not present

## 2018-08-15 DIAGNOSIS — E785 Hyperlipidemia, unspecified: Secondary | ICD-10-CM | POA: Diagnosis present

## 2018-08-15 DIAGNOSIS — F0150 Vascular dementia without behavioral disturbance: Secondary | ICD-10-CM | POA: Diagnosis present

## 2018-08-15 DIAGNOSIS — I714 Abdominal aortic aneurysm, without rupture: Secondary | ICD-10-CM | POA: Diagnosis present

## 2018-08-15 DIAGNOSIS — E039 Hypothyroidism, unspecified: Secondary | ICD-10-CM | POA: Diagnosis not present

## 2018-08-15 DIAGNOSIS — B029 Zoster without complications: Secondary | ICD-10-CM | POA: Diagnosis present

## 2018-08-15 DIAGNOSIS — R7989 Other specified abnormal findings of blood chemistry: Secondary | ICD-10-CM | POA: Diagnosis present

## 2018-08-15 DIAGNOSIS — E43 Unspecified severe protein-calorie malnutrition: Secondary | ICD-10-CM | POA: Diagnosis present

## 2018-08-15 DIAGNOSIS — J449 Chronic obstructive pulmonary disease, unspecified: Secondary | ICD-10-CM | POA: Diagnosis present

## 2018-08-15 DIAGNOSIS — D509 Iron deficiency anemia, unspecified: Secondary | ICD-10-CM | POA: Diagnosis not present

## 2018-08-15 DIAGNOSIS — A09 Infectious gastroenteritis and colitis, unspecified: Secondary | ICD-10-CM | POA: Diagnosis not present

## 2018-08-15 DIAGNOSIS — R32 Unspecified urinary incontinence: Secondary | ICD-10-CM | POA: Diagnosis present

## 2018-08-15 DIAGNOSIS — R197 Diarrhea, unspecified: Secondary | ICD-10-CM | POA: Diagnosis not present

## 2018-08-15 DIAGNOSIS — I2510 Atherosclerotic heart disease of native coronary artery without angina pectoris: Secondary | ICD-10-CM | POA: Diagnosis present

## 2018-08-15 DIAGNOSIS — R41841 Cognitive communication deficit: Secondary | ICD-10-CM | POA: Diagnosis not present

## 2018-08-15 DIAGNOSIS — Z87442 Personal history of urinary calculi: Secondary | ICD-10-CM | POA: Diagnosis not present

## 2018-08-15 LAB — CBC
HCT: 36.2 % (ref 36.0–46.0)
HEMOGLOBIN: 11.6 g/dL — AB (ref 12.0–15.0)
MCH: 30 pg (ref 26.0–34.0)
MCHC: 32 g/dL (ref 30.0–36.0)
MCV: 93.5 fL (ref 78.0–100.0)
PLATELETS: 195 10*3/uL (ref 150–400)
RBC: 3.87 MIL/uL (ref 3.87–5.11)
RDW: 15.6 % — ABNORMAL HIGH (ref 11.5–15.5)
WBC: 8.4 10*3/uL (ref 4.0–10.5)

## 2018-08-15 LAB — COMPREHENSIVE METABOLIC PANEL
ALBUMIN: 2.4 g/dL — AB (ref 3.5–5.0)
ALK PHOS: 63 U/L (ref 38–126)
ALT: 10 U/L (ref 0–44)
AST: 12 U/L — ABNORMAL LOW (ref 15–41)
Anion gap: 8 (ref 5–15)
BUN: 28 mg/dL — ABNORMAL HIGH (ref 8–23)
CALCIUM: 9.3 mg/dL (ref 8.9–10.3)
CHLORIDE: 106 mmol/L (ref 98–111)
CO2: 25 mmol/L (ref 22–32)
CREATININE: 0.87 mg/dL (ref 0.44–1.00)
GFR calc Af Amer: >60 mL/min (ref >60)
GFR calc non Af Amer: 56 mL/min — ABNORMAL LOW (ref >60)
GLUCOSE: 80 mg/dL (ref 70–99)
Potassium: 4.3 mmol/L (ref 3.5–5.1)
SODIUM: 139 mmol/L (ref 135–145)
Total Bilirubin: 0.9 mg/dL (ref 0.3–1.2)
Total Protein: 4.8 g/dL — ABNORMAL LOW (ref 6.5–8.1)

## 2018-08-15 LAB — ECHOCARDIOGRAM COMPLETE
HEIGHTINCHES: 63 in
WEIGHTICAEL: 1558.4 [oz_av]

## 2018-08-15 LAB — TROPONIN I
TROPONIN I: 0.03 ng/mL — AB (ref <0.03)
Troponin I: 0.03 ng/mL (ref <0.03)
Troponin I: 0.03 ng/mL (ref <0.03)

## 2018-08-15 LAB — OCCULT BLOOD X 1 CARD TO LAB, STOOL: Fecal Occult Bld: NEGATIVE

## 2018-08-15 MED ORDER — QUETIAPINE FUMARATE 25 MG PO TABS
25.0000 mg | ORAL_TABLET | Freq: Every day | ORAL | Status: DC
  Administered 2018-08-15 – 2018-08-26 (×12): 25 mg via ORAL
  Filled 2018-08-15 (×12): qty 1

## 2018-08-15 MED ORDER — MEMANTINE HCL 10 MG PO TABS
5.0000 mg | ORAL_TABLET | Freq: Every day | ORAL | Status: DC
  Administered 2018-08-15 – 2018-08-18 (×4): 5 mg via ORAL
  Filled 2018-08-15 (×4): qty 1

## 2018-08-15 MED ORDER — ENSURE ENLIVE PO LIQD
237.0000 mL | Freq: Two times a day (BID) | ORAL | Status: DC
  Administered 2018-08-16 – 2018-08-18 (×5): 237 mL via ORAL

## 2018-08-15 NOTE — Progress Notes (Signed)
Initial Nutrition Assessment  DOCUMENTATION CODES:   Underweight, Severe malnutrition in context of chronic illness  INTERVENTION:    Ensure Enlive po BID, each supplement provides 350 kcal and 20 grams of protein  Magic cup TID with meals, each supplement provides 290 kcal and 9 grams of protein  NUTRITION DIAGNOSIS:   Severe Malnutrition related to chronic illness, cancer and cancer related treatments as evidenced by percent weight loss, energy intake < or equal to 75% for > or equal to 1 month, moderate fat depletion, moderate muscle depletion, severe muscle depletion.  GOAL:   Patient will meet greater than or equal to 90% of their needs  MONITOR:   PO intake, Weight trends, Labs, Supplement acceptance  REASON FOR ASSESSMENT:   Malnutrition Screening Tool    ASSESSMENT:   Patient with PMH significant for colon cancer s/p resection with a colostomy bag, bladder cancer, and left renal pelvis with recurrent stent exchange, HTN, HLD, CAD, COPD, dementia, and GERD. Presents this admission with complaint of mild diarrhea and nausea for the last several days. Admitted for AKI with possible UTI.    Pt reports she had a poor appetite for the last 6 months and she is unsure why. States she typically eats one meal per day that consist of a protein and a grain (deli meat, chicken, bread, oatmeal, eggs). She drinks Ensure BID at home. She denies any swallowing issues. She ate oatmeal this morning with complication or nausea. Discussed the importance of protein intake for preservation of lean body mass. Pt willing to continue Ensure while admitted. RD encouraged PO intake.   Pt endorses a UBW of 140 lb and a recent wt loss of 30 lb over the last year. Records indicate 115 lb on 12/23/17 and 97 lb this admission (15.7% wt loss in 8 months, significant for time frame. Nutrition-Focused physical exam completed. Pt claims she is too weak to walk and she hasn't walked in the last several weeks.    Medications reviewed.  Labs reviewed.   NUTRITION - FOCUSED PHYSICAL EXAM:    Most Recent Value  Orbital Region  Mild depletion  Upper Arm Region  Moderate depletion  Thoracic and Lumbar Region  Unable to assess  Buccal Region  Moderate depletion  Temple Region  Severe depletion  Clavicle Bone Region  Severe depletion  Clavicle and Acromion Bone Region  Severe depletion  Scapular Bone Region  Unable to assess  Dorsal Hand  Severe depletion  Patellar Region  Moderate depletion  Anterior Thigh Region  Moderate depletion  Posterior Calf Region  Moderate depletion  Edema (RD Assessment)  Mild     Diet Order:   Diet Order            Diet Heart Room service appropriate? Yes; Fluid consistency: Thin  Diet effective now              EDUCATION NEEDS:   Education needs have been addressed  Skin:  Skin Assessment: Reviewed RN Assessment  Last BM:  08/14/18  Height:   Ht Readings from Last 1 Encounters:  08/15/18 5\' 3"  (1.6 m)    Weight:   Wt Readings from Last 1 Encounters:  08/15/18 44.2 kg    Ideal Body Weight:  52.2 kg  BMI:  Body mass index is 17.25 kg/m.  Estimated Nutritional Needs:   Kcal:  1350-1550 kcal  Protein:  65-80 grams  Fluid:  >/= 1.3 L/day  Mariana Single RD, LDN Clinical Nutrition Pager # - [phone]

## 2018-08-15 NOTE — Progress Notes (Signed)
  Echocardiogram 2D Echocardiogram has been performed.  Sherry [name] 08/15/2018, 9:12 AM

## 2018-08-15 NOTE — Clinical Social Work Note (Signed)
Clinical Social Work Assessment  Patient Details  Name: Sherry [name] MRN: [id number] Date of Birth: [birth year]  Date of referral:  08/15/18               Reason for consult:  Facility Placement                Permission sought to share information with:    Permission granted to share information::     Name::        Agency::     Relationship::     Contact Information:     Housing/Transportation Living arrangements for the past 2 months:  Single Family Home Source of Information:  Adult Children(Daughter Tacey Heap) Patient Interpreter Needed:  None Criminal Activity/Legal Involvement Pertinent to Current Situation/Hospitalization:  No - Comment as needed Significant Relationships:  Adult Children Lives with:  Adult Children Do you feel safe going back to the place where you live?  (Patient's daughter feels patient will need SNF at dc) Need for family participation in patient care:  Yes (Comment)  Care giving concerns:  Patient from home with daughter and son in law. Patient's daughter reported that patient uses a walker or cane at baseline and is unsteady on her feet. Patient's daughter reported that patient can usually bath independently in the shower with a shower chair. Patient's daughter reported that patient is no longer able to care for her colostomy bag and she has to assist patient in the bathroom. Patient's daughter reported that she anticipates that patient will need SNF at dc and thinks patient will eventually need [facility] term care at [facility]. PT consulted, evaluation pending.    Social Worker assessment / plan:  CSW spoke with patient's daughter regarding discharge planning, patient oriented to self and place unable to discuss discharge planning. Patient's daughter informed CSW that she anticipates that patient will need SNF at discharge, noting that she does not feel she can care for patient in patient's current condition. CSW explained that PT has been consulted and will  make a recommendation for patient's next level of care. CSW explained SNF placement process and Medicare coverage, patient's daughter verbalized understanding. Patient's daughter reported that she is agreeable to patient going to SNF for ST rehab and transition patient into [facility] term care. Patient's daughter reported that patient also has tri care coverage and [employer] medicaid, CSW explained the difference in payor sources for short term rehab versus [facility] term care. CSW agreed to complete patient's FL2 after PT recommendation is made and following up with SNF options.  CSW will complete FL2 after PT makes recommendation. Patient does not currently have a payor source for [facility] term care, patient's daughter agreeable to patient discharging to SNF for ST rehab.  CSW will continue to follow and assist with discharge planning.  Employment status:  Retired Forensic scientist:  Medicare PT Recommendations:  Not assessed at this time Information / Referral to community resources:  [place]  Patient/Family's Response to care:  Patient's daughter appreciative of CSW assistance with discharge planning.  Patient/Family's Understanding of and Emotional Response to Diagnosis, Current Treatment, and Prognosis:  Patient's daughter involved in patient's care and verbalized understanding of current treatment plan. Patient's daughter informed CSW about patient's recent decline and medical issues, CSW provided active listening. Patient's daughter hopeful that patient can dc to SNF for ST rehab and transition into [facility]   Emotional Assessment Appearance:    Attitude/Demeanor/Rapport:  Unable to Assess Affect (typically observed):  Unable to Assess Orientation:  Oriented to Self, Oriented to Place Alcohol / Substance use:  Not Applicable Psych involvement (Current and /or in the community):  No (Comment)  Discharge Needs  Concerns to be addressed:  Care Coordination Readmission  within the last 30 days:  No Current discharge risk:  Physical Impairment, Lack of support system Barriers to Discharge:  Continued Medical Work up   [employer], LCSW 08/15/2018, 3:24 PM

## 2018-08-15 NOTE — Progress Notes (Signed)
[facility] PROGRESS NOTE    Progress Note  Sherry [name]  ZOX:[id number] DOB: 23-Jun-1923 DOA: 08/14/2018 PCP: Marletta Lor, MD     Brief Narrative:   Sherry [name] is an 84 y.o. female past medical history of colon cancer status post resection with a colostomy bag, bladder cancer, and left renal pelvis with recurrent stent exchange last one on 07/09/2018 by Dr. Alyson Ingles, she is due for stent exchange in 2 weeks, essential hypertension, coronary artery disease and COPD comes into the hospital with several days of nausea and diarrhea, and extreme confusion and anorexic.  Most of the history was obtained from the daughter as the patient cannot provide history.  Assessment/Plan:   Diarrhea: Mild leukocytosis resolved. Started empirically on admission and IV ciprofloxacin and Flagyl. GI pathogens and C. Difficile pending. He does have a colostomy bag.  AKI: Likely pre-renal, resolved with IV hydration.  Nausea and vomiting: Use Zofran.  Possible urinary tract infection: Culture data is pending he was started empirically on IV Cipro.  Hypothyroidism Continue current medications.  Essential hypertension She is currently on no antihypertensive medication at home.  Elevated cardiac biomarkers: Levels have remained flat she denies any chest pain or shortness of breath KG showed nonspecific ST segment changes. The echo is pending.  Pressure injury of skin Turn patient every 2 hours.  Vascular dementia without aggressive behavior: Start namenda. Use Seroquel at bedtime.   DVT prophylaxis: Lovenox Family Communication:daughter Disposition Plan/Barrier to D/C: unable to determine Code Status:     Code Status Orders  (From admission, onward)         Start     Ordered   08/14/18 2033  Full code  Continuous     08/14/18 2034        Code Status History    Date Active Date Inactive Code Status Order ID Comments User Context   01/28/2017 1036 01/31/2017  1538 Full Code [id number]  Franchot Gallo, MD Inpatient    Advance Directive Documentation     Most Recent Value  Type of Advance Directive  Healthcare Power of Attorney  Pre-existing out of facility DNR order (yellow form or pink MOST form)  -  "MOST" Form in Place?  -        IV Access:    Peripheral IV   Procedures and diagnostic studies:   Ct Abdomen Pelvis Wo Contrast  Result Date: 08/14/2018 CLINICAL DATA:  Vomiting and diarrhea for several days. EXAM: CT ABDOMEN AND PELVIS WITHOUT CONTRAST TECHNIQUE: Multidetector CT imaging of the abdomen and pelvis was performed following the standard protocol without IV contrast. COMPARISON:  08/06/2018 FINDINGS: Lower chest: Tortuosity and calcific atherosclerotic disease of the aorta. Calcific atherosclerotic disease of the coronary arteries. Mitral valve annular calcifications. Hepatobiliary: 6 mm hypoattenuated focus in the dome of the liver, too small to be actually characterize by CT. Scattered granulomata. Pancreas: Unremarkable. No pancreatic ductal dilatation or surrounding inflammatory changes. Spleen: Scattered granulomata. Adrenals/Urinary Tract: Normal adrenal glands. Punctate nonobstructive bilateral renal calculi. No evidence of hydronephrosis. Left renal scarring. Left ureteral double-J stent stable with proximal and in the left renal pelvis and distal end within the urinary bladder. Stomach/Bowel: No evidence of small-bowel obstruction. Moderate amount of formed stool in the proximal colon. Ostomy in the left lower quadrant without evidence of parastomal herniation. Diffuse pericolonic inflammatory changes of the loops upstream to the ostomy. Postsurgical changes from resection of the rectum. Vascular/Lymphatic: Extensive aortic atherosclerosis. Known infrarenal abdominal aortic aneurysm is stable measuring  5.1 x 5.0 cm. No periaortic free fluid. No evidence of lymphadenopathy. Reproductive: Status post hysterectomy. No adnexal  masses. Other: No abdominal wall hernia or abnormality. No abdominopelvic ascites. Musculoskeletal: Stable 4 mm anterolisthesis of L4 on L5. Spondylosis at L4-L5. IMPRESSION: Interval development of mucosal thickening and pericolonic inflammatory changes of a long segment of the colon, upstream to the left lower quadrant colostomy. Moderate stool burden. Otherwise stable findings: Left double-J ureteral catheter. Known infrarenal abdominal aortic aneurysm, measuring 5.1 cm in greatest diameter. Marked atherosclerotic disease of the aorta. Electronically Signed   By: Fidela Salisbury M.D.   On: 08/14/2018 19:25   Dg Chest 2 View  Result Date: 08/14/2018 CLINICAL DATA:  Vomiting and diarrhea. EXAM: CHEST - 2 VIEW COMPARISON:  11/21/2015 FINDINGS: The cardiac silhouette is normal. Apparent enlargement of the contour of the ascending aorta. Calcific atherosclerotic disease and tortuosity of the aorta. Thickening of the paratracheal stripe bilaterally. There is no evidence of focal airspace consolidation, pleural effusion or pneumothorax. Osseous structures are without acute abnormality. Soft tissues are grossly normal. IMPRESSION: Apparent enlargement of the contour of the ascending aorta. Aneurysmal dilation cannot be excluded. Thickening of the paratracheal stripe bilaterally. This may represent enlargement of the thyroid gland or other space-occupying lesion within the mediastinum. Electronically Signed   By: Fidela Salisbury M.D.   On: 08/14/2018 18:30   Ct Head Wo Contrast  Result Date: 08/14/2018 CLINICAL DATA:  Altered mental status EXAM: CT HEAD WITHOUT CONTRAST TECHNIQUE: Contiguous axial images were obtained from the base of the skull through the vertex without intravenous contrast. COMPARISON:  None. FINDINGS: Brain: Diffuse cerebral atrophy. Mild chronic small vessel disease. No acute intracranial abnormality. Specifically, no hemorrhage, hydrocephalus, mass lesion, acute infarction, or  significant intracranial injury. Vascular: No hyperdense vessel or unexpected calcification. Skull: No acute calvarial abnormality. Sinuses/Orbits: No acute finding Other: None IMPRESSION: No acute intracranial abnormality. Atrophy, chronic microvascular disease. Electronically Signed   By: Rolm Baptise M.D.   On: 08/14/2018 19:13     Medical Consultants:    None.  Anti-Infectives:   Cipro  Subjective:    Sherry [name] patient denies any abdominal pain no further watery stools.  Objective:    Vitals:   08/14/18 2007 08/14/18 2117 08/15/18 0452 08/15/18 0605  BP: 137/82 (!) 141/97 135/89   Pulse: (!) 101 78 90   Resp: (!) 27  18   Temp:  98 F (36.7 C) 97.9 F (36.6 C)   TempSrc:  Oral Oral   SpO2: 100% 99% 99%   Weight:    44.2 kg  Height:    5\' 3"  (1.6 m)    Intake/Output Summary (Last 24 hours) at 08/15/2018 0818 Last data filed at 08/15/2018 0800 Gross per 24 hour  Intake 1715.97 ml  Output 300 ml  Net 1415.97 ml   Filed Weights   08/14/18 1618 08/15/18 0605  Weight: 44.9 kg 44.2 kg    Exam: General exam: In no acute distress. Respiratory system: Good air movement and clear to auscultation. Cardiovascular system: S1 & S2 heard, RRR.  Gastrointestinal system: Abdomen is nondistended, soft and nontender, ostomy bag in place. Central nervous system: No focal neurological deficits. Extremities: No pedal edema. Skin: No rashes, lesions or ulcers   Data Reviewed:    Labs: Basic Metabolic Panel: Recent Labs  Lab 08/12/18 1635 08/14/18 1821  NA 136 138  K 4.9 4.3  CL 100 100  CO2 27 26  GLUCOSE 140* 80  BUN 25* 33*  CREATININE 0.91 1.12*  CALCIUM 10.1 10.2   GFR Estimated Creatinine Clearance: 22.4 mL/min (A) (by C-G formula based on SCr of 1.12 mg/dL (H)). Liver Function Tests: Recent Labs  Lab 08/12/18 1635 08/14/18 1821  AST 10 13*  ALT 8 12  ALKPHOS 81 78  BILITOT 0.7 1.2  PROT 5.8* 5.9*  ALBUMIN 3.3* 2.9*   Recent Labs  Lab  08/14/18 1821  LIPASE 22   No results for input(s): AMMONIA in the last 168 hours. Coagulation profile No results for input(s): INR, PROTIME in the last 168 hours.  CBC: Recent Labs  Lab 08/12/18 1635 08/14/18 1821 08/15/18 0701  WBC 11.9* 10.5 8.4  NEUTROABS 10.3* 9.4*  --   HGB 12.3 12.9 11.6*  HCT 37.7 40.1 36.2  MCV 91.8 93.0 93.5  PLT 268.0 239 195   Cardiac Enzymes: Recent Labs  Lab 08/14/18 1821 08/15/18 0043  TROPONINI 0.04* 0.03*   BNP (last 3 results) No results for input(s): PROBNP in the last 8760 hours. CBG: No results for input(s): GLUCAP in the last 168 hours. D-Dimer: No results for input(s): DDIMER in the last 72 hours. Hgb A1c: No results for input(s): HGBA1C in the last 72 hours. Lipid Profile: No results for input(s): CHOL, HDL, LDLCALC, TRIG, CHOLHDL, LDLDIRECT in the last 72 hours. Thyroid function studies: Recent Labs    08/12/18 1635  TSH 0.09*   Anemia work up: No results for input(s): VITAMINB12, FOLATE, FERRITIN, TIBC, IRON, RETICCTPCT in the last 72 hours. Sepsis Labs: Recent Labs  Lab 08/12/18 1635 08/14/18 1821 08/15/18 0701  WBC 11.9* 10.5 8.4  LATICACIDVEN  --  0.9  --    Microbiology Recent Results (from the past 240 hour(s))  Urine Culture     Status: Abnormal   Collection Time: 08/06/18  2:15 PM  Result Value Ref Range Status   Specimen Description   Final    URINE, CLEAN CATCH Performed at [facility], [address], [place], [place] [address]    Special Requests   Final    NONE Performed at [facility], [address] [place], [place] [address]    Culture MULTIPLE SPECIES PRESENT, SUGGEST RECOLLECTION (A)  Final   Report Status 08/08/2018 FINAL  Final     Medications:   . atorvastatin  10 mg Oral Daily  . darifenacin  7.5 mg Oral Daily  . enoxaparin (LOVENOX) injection  30 mg Subcutaneous Q24H  . feeding supplement (PRO-STAT SUGAR FREE 64)  30 mL Oral BID  . levothyroxine  100 mcg Oral QAC breakfast    . metoprolol succinate  12.5 mg Oral QPM   Continuous Infusions: . sodium chloride Stopped (08/15/18 0644)  . sodium chloride 75 mL/hr at 08/15/18 0435  . ciprofloxacin    . metronidazole 100 mL/hr at 08/15/18 0652     LOS: 0 days   Charlynne Cousins  [facility] Pager [phone]  *Please refer to [email or web address], password [email or web address] to get updated schedule on who will round on this patient, as hospitalists switch teams weekly. If 7PM-7AM, please contact night-coverage at [email or web address], password [email or web address] for any overnight needs.  08/15/2018, 8:18 AM

## 2018-08-15 NOTE — Evaluation (Signed)
Physical Therapy Evaluation Patient Details Name: Sherry [name] MRN: [id number] DOB: 1923/10/08 Today's Date: 08/15/2018   History of Present Illness  Pt is a 84 YO female admitted for nausea, diarrhea, and "extreme confusion". PMH includes colon cancer with colostomy, rectal cancer, HTN, HLD, CAD, hypothyroid, COPD, GERD, diarrhea, arthritis, anemia, bronchitis, OP, scoliosis, shortness of breath. Past surgical history includes cardiac cath, cystoscopy multiples times, removal of bladder tumor 2018, rectal/colon surgeries with colostomy.   Clinical Impression   Pt admission and PMH information above. Pt presents with generalized weakness and deconditioning, confusion, difficulty with following commands consistently, and decreased tolerance for ambulation. Pt reported feeling confused during the session. Pt would benefit from acute PT to address deficits. Pt ambulated with min assist today, requiring mod verbal cuing. Pt recommending SNF for d/c due to confusion and difficulty with mobility/direction following with mobility. Per other notes, pt was living with pt's daughter prior to admission, and thinks that SNF is appropriate d/c plan as well. Will continue to follow acutely.     Follow Up Recommendations SNF;Supervision for mobility/OOB    Equipment Recommendations  None recommended by PT    Recommendations for Other Services       Precautions / Restrictions Precautions Precautions: Fall;Other (comment) Precaution Comments: Enteric precautions, colostomy  Restrictions Weight Bearing Restrictions: No      Mobility  Bed Mobility Overal bed mobility: Needs Assistance Bed Mobility: Supine to Sit     Supine to sit: Min guard     General bed mobility comments: Min guard for safety, increased time to perform   Transfers Overall transfer level: Needs assistance Equipment used: Rolling walker (2 wheeled) Transfers: Sit to/from Stand Sit to Stand: Min assist         General  transfer comment: Min assist for power up, pt requiring steadying upon standing. Verbal cuing for sequencing.   Ambulation/Gait Ambulation/Gait assistance: Min assist Gait Distance (Feet): 60 Feet Assistive device: Rolling walker (2 wheeled) Gait Pattern/deviations: Step-through pattern;Decreased stride length;Drifts right/left Gait velocity: increased, somewhat impulsive movements during gait    General Gait Details: Pt with decreased attention during ambulation, and required mod verbal cuing to step into RW. Pt with min assist for steadying and directing RW.   Stairs            Wheelchair Mobility    Modified Rankin (Stroke Patients Only)       Balance Overall balance assessment: Needs assistance Sitting-balance support: Feet unsupported;No upper extremity supported Sitting balance-Leahy Scale: Good   Postural control: Posterior lean Standing balance support: Bilateral upper extremity supported Standing balance-Leahy Scale: Poor Standing balance comment: relies on RW for steadying, stating "my right leg gives out on me"                              Pertinent Vitals/Pain Pain Assessment: No/denies pain    Home Living Family/patient expects to be discharged to:: Skilled nursing facility Living Arrangements: Children                    Prior Function Level of Independence: Independent with assistive device(s)         Comments: Pt reports she used RW for mobility prior to coming to the hospital, has no history of falls, and lives with her husband. Pt is a poor historian, per other notes that state pt lives with daughter.      Hand Dominance  Extremity/Trunk Assessment   Upper Extremity Assessment Upper Extremity Assessment: Generalized weakness    Lower Extremity Assessment Lower Extremity Assessment: Generalized weakness    Cervical / Trunk Assessment Cervical / Trunk Assessment: Normal  Communication   Communication: HOH   Cognition Arousal/Alertness: Awake/alert Behavior During Therapy: WFL for tasks assessed/performed Overall Cognitive Status: Impaired/Different from baseline Area of Impairment: Orientation;Memory;Following commands                 Orientation Level: Person   Memory: Decreased short-term memory Following Commands: Follows one step commands consistently       General Comments: Pt did not remember her birthday and states she has been confused since hospitalization. She also could not remember the age of her children or grandchildren.       General Comments      Exercises     Assessment/Plan    PT Assessment Patient needs continued PT services  PT Problem List Decreased strength;Decreased activity tolerance;Decreased cognition;Decreased balance;Decreased safety awareness;Decreased mobility       PT Treatment Interventions DME instruction;Therapeutic activities;Gait training;Therapeutic exercise;Patient/family education;Balance training;Functional mobility training    PT Goals (Current goals can be found in the Care Plan section)  Acute Rehab PT Goals PT Goal Formulation: With patient Time For Goal Achievement: 08/29/18 Potential to Achieve Goals: Good    Frequency Min 2X/week   Barriers to discharge        Co-evaluation               AM-PAC PT "6 Clicks" Daily Activity  Outcome Measure Difficulty turning over in bed (including adjusting bedclothes, sheets and blankets)?: Unable Difficulty moving from lying on back to sitting on the side of the bed? : Unable Difficulty sitting down on and standing up from a chair with arms (e.g., wheelchair, bedside commode, etc,.)?: Unable Help needed moving to and from a bed to chair (including a wheelchair)?: A Little Help needed walking in hospital room?: A Little Help needed climbing 3-5 steps with a railing? : A Lot 6 Click Score: 11    End of Session Equipment Utilized During Treatment: Gait belt Activity  Tolerance: Patient tolerated treatment well;Patient limited by fatigue Patient left: in chair;with chair alarm set;with call bell/phone within reach;with family/visitor present Nurse Communication: Mobility status PT Visit Diagnosis: Unsteadiness on feet (R26.81);Difficulty in walking, not elsewhere classified (R26.2)    Time: 5462-7035 PT Time Calculation (min) (ACUTE ONLY): 24 min   Charges:   PT Evaluation $PT Eval Low Complexity: 1 Low PT Treatments $Gait Training: 8-22 mins        Urho Rio Conception Chancy, PT, DPT  Pager # [phone]    Ruthel Martine D Kadir Azucena 08/15/2018, 6:03 PM

## 2018-08-16 DIAGNOSIS — E43 Unspecified severe protein-calorie malnutrition: Secondary | ICD-10-CM

## 2018-08-16 LAB — GASTROINTESTINAL PANEL BY PCR, STOOL (REPLACES STOOL CULTURE)
ADENOVIRUS F40/41: NOT DETECTED
Astrovirus: NOT DETECTED
CRYPTOSPORIDIUM: NOT DETECTED
CYCLOSPORA CAYETANENSIS: NOT DETECTED
Campylobacter species: NOT DETECTED
ENTAMOEBA HISTOLYTICA: NOT DETECTED
ENTEROAGGREGATIVE E COLI (EAEC): NOT DETECTED
ENTEROPATHOGENIC E COLI (EPEC): NOT DETECTED
Enterotoxigenic E coli (ETEC): DETECTED — AB
GIARDIA LAMBLIA: NOT DETECTED
Norovirus GI/GII: NOT DETECTED
Plesimonas shigelloides: NOT DETECTED
ROTAVIRUS A: NOT DETECTED
SALMONELLA SPECIES: NOT DETECTED
SHIGELLA/ENTEROINVASIVE E COLI (EIEC): NOT DETECTED
Sapovirus (I, II, IV, and V): NOT DETECTED
Shiga like toxin producing E coli (STEC): NOT DETECTED
VIBRIO CHOLERAE: NOT DETECTED
VIBRIO SPECIES: NOT DETECTED
YERSINIA ENTEROCOLITICA: NOT DETECTED

## 2018-08-16 MED ORDER — TRAMADOL-ACETAMINOPHEN 37.5-325 MG PO TABS: 1.0000 | ORAL_TABLET | Freq: Four times a day (QID) | ORAL | 0 refills | Status: DC | PRN

## 2018-08-16 MED ORDER — CIPROFLOXACIN HCL 500 MG PO TABS: 500.0000 mg | ORAL_TABLET | Freq: Two times a day (BID) | ORAL | 0 refills | Status: DC

## 2018-08-16 NOTE — Discharge Summary (Signed)
Physician Discharge Summary  Sherry [name] WRU:[id number] DOB: 1923/10/06 DOA: 08/14/2018  PCP: Marletta Lor, MD  Admit date: 08/14/2018 Discharge date: 08/16/2018  Admitted From: home Disposition:  SNF  Recommendations for Outpatient Follow-up:  1. Follow up with PCP in 1-2 weeks 2. Will go to SNF  Home Health:No Equipment/Devices:none  Discharge Condition:stable CODE STATUS:full Diet recommendation: Heart Healthy  Brief/Interim Summary: 84 y.o. female past medical history of colon cancer status post resection with a colostomy bag, bladder cancer, and left renal pelvis with recurrent stent exchange last one on 07/09/2018 by Dr. Alyson Ingles, she is due for stent exchange in 2 weeks, essential hypertension, coronary artery disease and COPD comes into the hospital with several days of nausea and diarrhea, and extreme confusion and anorexic.  Most of the history was obtained from the daughter as the patient cannot provide history.  Discharge Diagnoses:  Active Problems:   Hypothyroidism   Essential hypertension   Diarrhea   Pressure injury of skin   Possible acute lower UTI   AKI (acute kidney injury) ([place])   Protein-calorie malnutrition, severe  Diarrhea: Mild leukocytosis resolved. GI pathogen C. difficile remain negative.  AKI: Likely pre-renal, resolved with IV hydration.  Nausea and vomiting: Use Zofran.  Possible urinary tract infection: Culture data grew E. coli with more than 100,000 colonies she was changed to oral Cipro which she will continue as an outpatient for 4 additional days.  Hypothyroidism Continue current medications.  Essential hypertension She is currently on no antihypertensive medication at home.  Elevated cardiac biomarkers: Levels have remained flat she denies any chest pain or shortness of breath KG showed nonspecific ST segment changes. The echo showed no acute findings  Pressure injury of skin Turn patient every 2  hours.  Vascular dementia without aggressive behavior:   Discharge Instructions  Discharge Instructions    Diet - low sodium heart healthy   Complete by:  As directed    Increase activity slowly   Complete by:  As directed      Allergies as of 08/16/2018      Reactions   Percocet [oxycodone-acetaminophen] Nausea And Vomiting   Influenza Virus Vacc Split Pf Other (See Comments)   Unknown  But had to be hospitalized -1966   Sulfamethoxazole-trimethoprim Nausea And Vomiting   Hibiclens [chlorhexidine Gluconate] Rash      Medication List    TAKE these medications   acetaminophen 650 MG CR tablet Commonly known as:  TYLENOL Take 1,300 mg by mouth daily as needed for pain.   atorvastatin 10 MG tablet Commonly known as:  LIPITOR TAKE 1 TABLET DAILY   ciprofloxacin 500 MG tablet Commonly known as:  CIPRO Take 1 tablet (500 mg total) by mouth 2 (two) times daily for 4 days.   diphenoxylate-atropine 2.5-0.025 MG tablet Commonly known as:  LOMOTIL Take 1 tablet by mouth 4 (four) times daily as needed for diarrhea or loose stools.   levothyroxine 100 MCG tablet Commonly known as:  SYNTHROID, LEVOTHROID Take 100 mcg by mouth daily before breakfast.   levothyroxine 75 MCG tablet Commonly known as:  SYNTHROID, LEVOTHROID Take 1 tablet (75 mcg total) by mouth daily.   ondansetron 4 MG tablet Commonly known as:  ZOFRAN Take 1 tablet (4 mg total) by mouth every 8 (eight) hours as needed for nausea or vomiting.   PROAIR HFA 108 (90 Base) MCG/ACT inhaler Generic drug:  albuterol USE 2 INHALATIONS INTO THE LUNGS EVERY 4 HOURS AS NEEDED FOR SHORTNESS OF BREATH What  changed:  See the new instructions.   TOPROL XL 25 MG 24 hr tablet Generic drug:  metoprolol succinate TAKE ONE-HALF (1/2) TABLET DAILY What changed:    how much to take  how to take this  when to take this  additional instructions   traMADol-acetaminophen 37.5-325 MG tablet Commonly known as:   ULTRACET Take 1 tablet by mouth every 6 (six) hours as needed for moderate pain.   Trospium Chloride 60 MG Cp24 Take 1 capsule by mouth daily.       Allergies  Allergen Reactions  . Percocet [Oxycodone-Acetaminophen] Nausea And Vomiting  . Influenza Virus Vacc Split Pf Other (See Comments)    Unknown  But had to be hospitalized -1966  . Sulfamethoxazole-Trimethoprim Nausea And Vomiting  . Hibiclens [Chlorhexidine Gluconate] Rash    Consultations:  None   Procedures/Studies: Ct Abdomen Pelvis Wo Contrast  Result Date: 08/14/2018 CLINICAL DATA:  Vomiting and diarrhea for several days. EXAM: CT ABDOMEN AND PELVIS WITHOUT CONTRAST TECHNIQUE: Multidetector CT imaging of the abdomen and pelvis was performed following the standard protocol without IV contrast. COMPARISON:  08/06/2018 FINDINGS: Lower chest: Tortuosity and calcific atherosclerotic disease of the aorta. Calcific atherosclerotic disease of the coronary arteries. Mitral valve annular calcifications. Hepatobiliary: 6 mm hypoattenuated focus in the dome of the liver, too small to be actually characterize by CT. Scattered granulomata. Pancreas: Unremarkable. No pancreatic ductal dilatation or surrounding inflammatory changes. Spleen: Scattered granulomata. Adrenals/Urinary Tract: Normal adrenal glands. Punctate nonobstructive bilateral renal calculi. No evidence of hydronephrosis. Left renal scarring. Left ureteral double-J stent stable with proximal and in the left renal pelvis and distal end within the urinary bladder. Stomach/Bowel: No evidence of small-bowel obstruction. Moderate amount of formed stool in the proximal colon. Ostomy in the left lower quadrant without evidence of parastomal herniation. Diffuse pericolonic inflammatory changes of the loops upstream to the ostomy. Postsurgical changes from resection of the rectum. Vascular/Lymphatic: Extensive aortic atherosclerosis. Known infrarenal abdominal aortic aneurysm is stable  measuring 5.1 x 5.0 cm. No periaortic free fluid. No evidence of lymphadenopathy. Reproductive: Status post hysterectomy. No adnexal masses. Other: No abdominal wall hernia or abnormality. No abdominopelvic ascites. Musculoskeletal: Stable 4 mm anterolisthesis of L4 on L5. Spondylosis at L4-L5. IMPRESSION: Interval development of mucosal thickening and pericolonic inflammatory changes of a long segment of the colon, upstream to the left lower quadrant colostomy. Moderate stool burden. Otherwise stable findings: Left double-J ureteral catheter. Known infrarenal abdominal aortic aneurysm, measuring 5.1 cm in greatest diameter. Marked atherosclerotic disease of the aorta. Electronically Signed   By: Fidela Salisbury M.D.   On: 08/14/2018 19:25   Dg Chest 2 View  Result Date: 08/14/2018 CLINICAL DATA:  Vomiting and diarrhea. EXAM: CHEST - 2 VIEW COMPARISON:  11/21/2015 FINDINGS: The cardiac silhouette is normal. Apparent enlargement of the contour of the ascending aorta. Calcific atherosclerotic disease and tortuosity of the aorta. Thickening of the paratracheal stripe bilaterally. There is no evidence of focal airspace consolidation, pleural effusion or pneumothorax. Osseous structures are without acute abnormality. Soft tissues are grossly normal. IMPRESSION: Apparent enlargement of the contour of the ascending aorta. Aneurysmal dilation cannot be excluded. Thickening of the paratracheal stripe bilaterally. This may represent enlargement of the thyroid gland or other space-occupying lesion within the mediastinum. Electronically Signed   By: Fidela Salisbury M.D.   On: 08/14/2018 18:30   Ct Head Wo Contrast  Result Date: 08/14/2018 CLINICAL DATA:  Altered mental status EXAM: CT HEAD WITHOUT CONTRAST TECHNIQUE: Contiguous axial images were obtained  from the base of the skull through the vertex without intravenous contrast. COMPARISON:  None. FINDINGS: Brain: Diffuse cerebral atrophy. Mild chronic small  vessel disease. No acute intracranial abnormality. Specifically, no hemorrhage, hydrocephalus, mass lesion, acute infarction, or significant intracranial injury. Vascular: No hyperdense vessel or unexpected calcification. Skull: No acute calvarial abnormality. Sinuses/Orbits: No acute finding Other: None IMPRESSION: No acute intracranial abnormality. Atrophy, chronic microvascular disease. Electronically Signed   By: Rolm Baptise M.D.   On: 08/14/2018 19:13   Ct Renal Stone Study  Result Date: 08/06/2018 CLINICAL DATA:  Flank pain. History of renal calculi. Known double-J stent on the left. History of rectal carcinoma EXAM: CT ABDOMEN AND PELVIS WITHOUT CONTRAST TECHNIQUE: Multidetector CT imaging of the abdomen and pelvis was performed following the standard protocol without intravenous contrast. Oral contrast was administered. COMPARISON:  December 17, 2017 FINDINGS: Lower chest: Lung bases are clear. There are foci of coronary artery calcification. Hepatobiliary: There are calcified granulomas throughout the liver. Occasional subcentimeter presumed cysts are stable. No new liver lesion is evident. Gallbladder is absent. There is no appreciable biliary duct dilatation. Pancreas: There is no evident pancreatic mass or inflammatory focus. Spleen: There are granulomas throughout the spleen. No focal splenic lesions beyond calcified granulomas are evident. Adrenals/Urinary Tract: Adrenals appear unremarkable bilaterally. There is a cyst in the medial upper pole right kidney measuring 1.5 x 1.5 cm. There is a mass along the lateral aspect of the right kidney measuring approximately 1 x 1 cm which has attenuation values suggesting probable mildly complicated cyst. There is a apparent hyperdense cyst along the lateral midportion left kidney. There is a double-J stent on the left extending from the left renal pelvis to the bladder. There are several tiny calculi in the lower pole left kidney. There is a 1 mm calculus in  the lower pole the right kidney. Urinary bladder is midline with wall thickness within normal limits. Stomach/Bowel: There is fluid throughout the bowel with moderate stool in the colon. There is no appreciable bowel wall thickening or bowel dilatation. No free air evident. There is an ostomy in the left lower quadrant with evidence of parastomal herniation within this ostomy. The patient has had previous removal of the rectum. There is no evident bowel obstruction. No free air or portal venous air. Vascular/Lymphatic: There is extensive aortic and iliac artery atherosclerosis. There is an aneurysm of the distal abdominal aorta which currently measures 5.2 x 5.2 cm, increased in size compared to most recent study. There is no periaortic fluid. There is no adenopathy evident in the abdomen or pelvis. Reproductive: Uterus is absent.  No pelvic masses evident. Other: There is no periappendiceal region inflammatory change. There is no abscess or ascites in the abdomen or pelvis. Musculoskeletal: There is 4 mm of anterolisthesis of L4 on L5. There is vacuum phenomenon at L4-5. There are no blastic or lytic bone lesions. No intramuscular lesions are evident. IMPRESSION: 1. Double-J stent on the left extending from the left renal pelvis to the bladder. There are tiny calculi in the lower pole left kidney. Double-J stent potentially could obscure a somewhat larger calculus. There is also a 1 mm nonobstructing calculus in the lower pole of the right kidney. There is no evident hydronephrosis on either side. 2. Patient has had surgical removal of the rectum. There is a left lower quadrant ostomy with a stable peristomal hernia. No bowel obstruction evident. No abscess in the abdomen or pelvis. No periappendiceal region inflammation. Fluid is noted throughout  most of the bowel. This finding may indicate a degree of ileus or enteritis. 3. Abdominal aortic aneurysm measuring 5.2 x 5.2 cm, increased in size from January 2019, at  which time it measured 4.6 x 4.6 cm. This change may warrant vascular surgery consultation. 4. Evidence of prior granulomatous disease with granulomas in the liver and spleen. 5.  Gallbladder absent. Aortic aneurysm NOS (ICD10-I71.9). Aortic Atherosclerosis (ICD10-I70.0). Electronically Signed   By: Lowella Grip III M.D.   On: 08/06/2018 18:32      Subjective:  No complains tolerating her diet Discharge Exam: Vitals:   08/15/18 2143 08/16/18 0634  BP: (!) 148/98 (!) 144/89  Pulse: 82 65  Resp: 18 18  Temp: 98.6 F (37 C) 98 F (36.7 C)  SpO2: 91% 92%   Vitals:   08/15/18 0605 08/15/18 1247 08/15/18 2143 08/16/18 0634  BP:  125/75 (!) 148/98 (!) 144/89  Pulse:  90 82 65  Resp:  18 18 18   Temp:  98.2 F (36.8 C) 98.6 F (37 C) 98 F (36.7 C)  TempSrc:  Oral  Oral  SpO2:  96% 91% 92%  Weight: 44.2 kg     Height: 5\' 3"  (1.6 m)       General: Pt is alert, awake, not in acute distress Cardiovascular: RRR, S1/S2 +, no rubs, no gallops Respiratory: CTA bilaterally, no wheezing, no rhonchi Abdominal: Soft, NT, ND, bowel sounds + Extremities: no edema, no cyanosis    The results of significant diagnostics from this hospitalization (including imaging, microbiology, ancillary and laboratory) are listed below for reference.     Microbiology: Recent Results (from the past 240 hour(s))  Urine Culture     Status: Abnormal   Collection Time: 08/06/18  2:15 PM  Result Value Ref Range Status   Specimen Description   Final    URINE, CLEAN CATCH Performed at [facility], [address], [place], [place] [address]    Special Requests   Final    NONE Performed at [facility], [address], [place], [place] [address]    Culture MULTIPLE SPECIES PRESENT, SUGGEST RECOLLECTION (A)  Final   Report Status 08/08/2018 FINAL  Final  Urine culture     Status: Abnormal (Preliminary result)   Collection Time: 08/14/18  7:44 PM  Result Value Ref Range Status   Specimen Description    Final    URINE, CLEAN CATCH Performed at [facility], [place] [address] [place], [place] [address]    Special Requests   Final    NONE Performed at [facility], [place] [address], [place], [place] [address]    Culture (A)  Final    >=100,000 COLONIES/mL ESCHERICHIA COLI 80,000 COLONIES/mL ENTEROCOCCUS FAECALIS SUSCEPTIBILITIES TO FOLLOW Performed at [facility], [place] [address] [place], [place] [address]    Report Status PENDING  Incomplete     Labs: BNP (last 3 results) No results for input(s): BNP in the last 8760 hours. Basic Metabolic Panel: Recent Labs  Lab 08/12/18 1635 08/14/18 1821 08/15/18 0701  NA 136 138 139  K 4.9 4.3 4.3  CL 100 100 106  CO2 27 26 25   GLUCOSE 140* 80 80  BUN 25* 33* 28*  CREATININE 0.91 1.12* 0.87  CALCIUM 10.1 10.2 9.3   Liver Function Tests: Recent Labs  Lab 08/12/18 1635 08/14/18 1821 08/15/18 0701  AST 10 13* 12*  ALT 8 12 10   ALKPHOS 81 78 63  BILITOT 0.7 1.2 0.9  PROT 5.8* 5.9* 4.8*  ALBUMIN 3.3* 2.9* 2.4*   Recent Labs  Lab 08/14/18 1821  LIPASE 22   No results for input(s): AMMONIA in the last 168 hours. CBC: Recent Labs  Lab 08/12/18 1635 08/14/18 1821 08/15/18 0701  WBC 11.9* 10.5 8.4  NEUTROABS 10.3* 9.4*  --   HGB 12.3 12.9 11.6*  HCT 37.7 40.1 36.2  MCV 91.8 93.0 93.5  PLT 268.0 239 195   Cardiac Enzymes: Recent Labs  Lab 08/14/18 1821 08/15/18 0043 08/15/18 0701 08/15/18 1315  TROPONINI 0.04* 0.03* 0.03* 0.03*   BNP: Invalid input(s): POCBNP CBG: No results for input(s): GLUCAP in the last 168 hours. D-Dimer No results for input(s): DDIMER in the last 72 hours. Hgb A1c No results for input(s): HGBA1C in the last 72 hours. Lipid Profile No results for input(s): CHOL, HDL, LDLCALC, TRIG, CHOLHDL, LDLDIRECT in the last 72 hours. Thyroid function studies No results for input(s): TSH, T4TOTAL, T3FREE, THYROIDAB in the last 72 hours.  Invalid  input(s): FREET3 Anemia work up No results for input(s): VITAMINB12, FOLATE, FERRITIN, TIBC, IRON, RETICCTPCT in the last 72 hours. Urinalysis    Component Value Date/Time   COLORURINE AMBER (A) 08/14/2018 1944   APPEARANCEUR CLOUDY (A) 08/14/2018 1944   LABSPEC 1.018 08/14/2018 1944   LABSPEC 1.030 12/06/2009 1628   PHURINE 5.0 08/14/2018 1944   GLUCOSEU NEGATIVE 08/14/2018 1944   HGBUR MODERATE (A) 08/14/2018 1944   BILIRUBINUR NEGATIVE 08/14/2018 1944   BILIRUBINUR 1+ 02/10/2016 1638   BILIRUBINUR Negative 12/06/2009 1628   KETONESUR 20 (A) 08/14/2018 1944   PROTEINUR 100 (A) 08/14/2018 1944   UROBILINOGEN 1.0 02/10/2016 1638   UROBILINOGEN 1.0 04/01/2010 1500   NITRITE POSITIVE (A) 08/14/2018 1944   LEUKOCYTESUR LARGE (A) 08/14/2018 1944   LEUKOCYTESUR Small 12/06/2009 1628   Sepsis Labs Invalid input(s): PROCALCITONIN,  WBC,  LACTICIDVEN Microbiology Recent Results (from the past 240 hour(s))  Urine Culture     Status: Abnormal   Collection Time: 08/06/18  2:15 PM  Result Value Ref Range Status   Specimen Description   Final    URINE, CLEAN CATCH Performed at [facility], [address] [place], [place] [address]    Special Requests   Final    NONE Performed at [facility], [address], [place], [place] [address]    Culture MULTIPLE SPECIES PRESENT, SUGGEST RECOLLECTION (A)  Final   Report Status 08/08/2018 FINAL  Final  Urine culture     Status: Abnormal (Preliminary result)   Collection Time: 08/14/18  7:44 PM  Result Value Ref Range Status   Specimen Description   Final    URINE, CLEAN CATCH Performed at [facility], [place] [address], [place], [place] [address]    Special Requests   Final    NONE Performed at [facility], [place] [address] [place], [place] [address]    Culture (A)  Final    >=100,000 COLONIES/mL ESCHERICHIA COLI 80,000 COLONIES/mL ENTEROCOCCUS FAECALIS SUSCEPTIBILITIES TO FOLLOW Performed at [facility], [place] [address], [place],  [address]    Report Status PENDING  Incomplete     Time coordinating discharge:40 minutes  SIGNED:   Charlynne Cousins, MD  [facility] 08/16/2018, 9:30 AM Pager   If 7PM-7AM, please contact night-coverage [email or web address] Password [email or web address]

## 2018-08-16 NOTE — NC FL2 (Signed)
[place] LEVEL OF CARE SCREENING TOOL     IDENTIFICATION  Patient Name: Sherry [name] Birthdate: 07/28/1923 Sex: female Admission Date (Current Location): 08/14/2018  [facility] and [place] Number:  Engineer, manufacturing systems and Address:  [facility],  [place], [place]      Provider Number: [id number]  Attending Physician Name and Address:  Charlynne Cousins, MD  Relative Name and Phone Number:  Tacey Heap: [phone]    Current Level of Care: Hospital Recommended Level of Care: [place] Prior Approval Number:    Date Approved/Denied:   PASRR Number: [id number]  Discharge Plan: SNF    Current Diagnoses: Patient Active Problem List   Diagnosis Date Noted  . Protein-calorie malnutrition, severe 08/16/2018  . Pressure injury of skin 08/15/2018  . Possible acute lower UTI 08/15/2018  . AKI (acute kidney injury) ([place]) 08/15/2018  . Diarrhea 08/14/2018  . Herpes zoster without complication 61/44/3154  . Hematuria, gross 01/28/2017  . Acquired pelvic enterocele 12/28/2013  . Unsteady gait 11/26/2013  . Colostomy care ([place]) 08/21/2013  . Renal lesion 07/26/2013  . Skin lesion - left lower quadrant abdominal wall 01/21/2013  . Parastomal hernia without obstruction or gangrene 09/26/2012  . PARESTHESIA, HANDS 08/15/2010  . ADENOCARCINOMA, RECTUM 09/01/2009  . Hypothyroidism 09/01/2009  . Dyslipidemia 09/01/2009  . ANEMIA-IRON DEFICIENCY 09/01/2009  . Essential hypertension 09/01/2009  . COPD 09/01/2009  . OSTEOPOROSIS 09/01/2009    Orientation RESPIRATION BLADDER Height & Weight     Self, Time, Place  Normal Continent Weight: 97 lb 6.4 oz (44.2 kg) Height:  5\' 3"  (160 cm)  BEHAVIORAL SYMPTOMS/MOOD NEUROLOGICAL BOWEL NUTRITION STATUS      Colostomy Diet(Regular)  AMBULATORY STATUS COMMUNICATION OF NEEDS Skin   Limited Assist Verbally Other (Comment)(Pressure injury - sacrum)                       Personal Care Assistance Level of Assistance  Bathing, Feeding, Dressing Bathing Assistance: Maximum assistance Feeding assistance: Limited assistance Dressing Assistance: Maximum assistance     Functional Limitations Info  Sight, Hearing, Speech Sight Info: Adequate Hearing Info: Adequate Speech Info: Adequate    SPECIAL CARE FACTORS FREQUENCY  PT (By licensed PT), OT (By licensed OT)     PT Frequency: 5x/week OT Frequency: 5x/week            Contractures Contractures Info: Not present    Additional Factors Info  Code Status, Allergies, Isolation Precautions Code Status Info: Full Allergies Info: PERCOCET OXYCODONE-ACETAMINOPHEN, INFLUENZA VIRUS VACC SPLIT PF, SULFAMETHOXAZOLE-TRIMETHOPRIM, HIBICLENS CHLORHEXIDINE GLUCONATE      Isolation Precautions Info: Enteric precautions (UV disinfection)     Current Medications (08/16/2018):  This is the current hospital active medication list Current Facility-Administered Medications  Medication Dose Route Frequency Provider Last Rate Last Dose  . 0.9 %  sodium chloride infusion   Intravenous Continuous Francine Graven, DO 100 mL/hr at 08/16/18 0086    . acetaminophen (TYLENOL) tablet 650 mg  650 mg Oral Q6H PRN [name], MD       Or  . acetaminophen (TYLENOL) suppository 650 mg  650 mg Rectal Q6H PRN [name], MD      . albuterol (PROVENTIL) (2.5 MG/3ML) 0.083% nebulizer solution 3 mL  3 mL Inhalation Q4H PRN [name], MD      . atorvastatin (LIPITOR) tablet 10 mg  10 mg Oral Daily [name], MD   10 mg at 08/16/18 7619  .  ciprofloxacin (CIPRO) IVPB 400 mg  400 mg Intravenous Q24H [name], MD 200 mL/hr at 08/15/18 2034 400 mg at 08/15/18 2034  . darifenacin (ENABLEX) 24 hr tablet 7.5 mg  7.5 mg Oral Daily [name], MD   7.5 mg at 08/16/18 0939  . diphenoxylate-atropine (LOMOTIL) 2.5-0.025 MG per tablet 1 tablet  1 tablet Oral QID PRN [name], MD      . enoxaparin (LOVENOX) injection 30 mg  30 mg  Subcutaneous Q24H [name], MD   30 mg at 08/15/18 2208  . feeding supplement (ENSURE ENLIVE) (ENSURE ENLIVE) liquid 237 mL  237 mL Oral BID BM Charlynne Cousins, MD   237 mL at 08/16/18 0941  . levothyroxine (SYNTHROID, LEVOTHROID) tablet 100 mcg  100 mcg Oral QAC breakfast [name], MD   100 mcg at 08/16/18 [phone]  . memantine (NAMENDA) tablet 5 mg  5 mg Oral Daily Charlynne Cousins, MD   5 mg at 08/16/18 [phone]  . metoprolol succinate (TOPROL-XL) 24 hr tablet 12.5 mg  12.5 mg Oral QPM [name], MD   12.5 mg at 08/15/18 1741  . metroNIDAZOLE (FLAGYL) IVPB 500 mg  500 mg Intravenous Lysle Dingwall, MD 100 mL/hr at 08/16/18 0624 500 mg at 08/16/18 0624  . ondansetron (ZOFRAN) injection 4 mg  4 mg Intravenous Q6H PRN [name], MD      . QUEtiapine (SEROQUEL) tablet 25 mg  25 mg Oral QHS Charlynne Cousins, MD   25 mg at 08/15/18 2207     Discharge Medications: Please see discharge summary for a list of discharge medications.  Relevant Imaging Results:  Relevant Lab Results:   Additional Information SSN: [id number]  Pricilla Holm, LCSW

## 2018-08-16 NOTE — Progress Notes (Signed)
CSW spoke with patient and family regarding discharge plan at RN's request. Family expressed concern about ability to care for patient and desire to pursue long term care.   CSW provided family with current SNF bed offers for short term therapy.  CSW encouraged them to pursue short term therapy to help regain patient's strength and work with SNF to pursue ALF after therapy is complete.   Family appreciative and looking into SNF facilities. CSW reiterated patient will need 3 night stay to qualify for SNF placement under Medicare.   CSW will continue to follow for discharge planning.     Pricilla Holm, MSW, [place] Social Work [phone]

## 2018-08-17 DIAGNOSIS — R197 Diarrhea, unspecified: Secondary | ICD-10-CM

## 2018-08-17 DIAGNOSIS — R112 Nausea with vomiting, unspecified: Secondary | ICD-10-CM

## 2018-08-17 DIAGNOSIS — K529 Noninfective gastroenteritis and colitis, unspecified: Secondary | ICD-10-CM

## 2018-08-17 DIAGNOSIS — E039 Hypothyroidism, unspecified: Secondary | ICD-10-CM

## 2018-08-17 DIAGNOSIS — N390 Urinary tract infection, site not specified: Secondary | ICD-10-CM

## 2018-08-17 DIAGNOSIS — N179 Acute kidney failure, unspecified: Principal | ICD-10-CM

## 2018-08-17 DIAGNOSIS — A09 Infectious gastroenteritis and colitis, unspecified: Secondary | ICD-10-CM

## 2018-08-17 DIAGNOSIS — I10 Essential (primary) hypertension: Secondary | ICD-10-CM

## 2018-08-17 DIAGNOSIS — E860 Dehydration: Secondary | ICD-10-CM

## 2018-08-17 DIAGNOSIS — E43 Unspecified severe protein-calorie malnutrition: Secondary | ICD-10-CM

## 2018-08-17 LAB — URINE CULTURE: Culture: >=100000 — AB

## 2018-08-17 MED ORDER — PROMETHAZINE HCL 25 MG/ML IJ SOLN
12.5000 mg | Freq: Once | INTRAMUSCULAR | Status: AC
  Administered 2018-08-17: 12.5 mg via INTRAVENOUS
  Filled 2018-08-17: qty 1

## 2018-08-17 MED ORDER — METRONIDAZOLE 500 MG PO TABS
500.0000 mg | ORAL_TABLET | Freq: Three times a day (TID) | ORAL | Status: DC
  Administered 2018-08-17 – 2018-08-18 (×3): 500 mg via ORAL
  Filled 2018-08-17 (×3): qty 1

## 2018-08-17 MED ORDER — CIPROFLOXACIN HCL 500 MG PO TABS
500.0000 mg | ORAL_TABLET | Freq: Two times a day (BID) | ORAL | Status: DC
  Administered 2018-08-17 – 2018-08-18 (×3): 500 mg via ORAL
  Filled 2018-08-17 (×3): qty 1

## 2018-08-17 MED ORDER — CEPHALEXIN 500 MG PO CAPS: 500.0000 mg | ORAL_CAPSULE | Freq: Three times a day (TID) | ORAL | Status: DC

## 2018-08-17 NOTE — Progress Notes (Signed)
[facility] PROGRESS NOTE    Progress Note  Sherry [name]  KNL:[id number] DOB: June 21, 1923 DOA: 08/14/2018 PCP: Marletta Lor, MD     Brief Narrative:   Sherry [name] is an 84 y.o. female past medical history of colon cancer status post resection with a colostomy bag, bladder cancer, and left renal pelvis with recurrent stent exchange last one on 07/09/2018 by Dr. Alyson Ingles, she is due for stent exchange in 2 weeks, essential hypertension, coronary artery disease and COPD comes into the hospital with several days of nausea and diarrhea, and extreme confusion and anorexic.  Most of the history was obtained from the daughter as the patient cannot provide history.  Assessment/Plan:   Diarrhea: Mild leukocytosis resolved. GI pathogens enterotoxic E. coli He does have a colostomy bag. Continue oral ciprofloxacin. She is awaiting placement.  AKI: Likely pre-renal, resolved with IV hydration.  Nausea and vomiting: Resolved likely due to infectious etiology.  Possible urinary tract infection: Culture data show E. coli sensitive to Cipro we will continue Cipro orally.  Hypothyroidism Continue current medications.  Essential hypertension She is currently on no antihypertensive medication at home.  Elevated cardiac biomarkers: Levels have remained flat she denies any chest pain or shortness of breath KG showed nonspecific ST segment changes. The echo is pending.  Pressure injury of skin Turn patient every 2 hours.  Vascular dementia without aggressive behavior: Continue Namenda. Use Seroquel at bedtime.   DVT prophylaxis: Lovenox Family Communication:daughter Disposition Plan/Barrier to D/C: Awaiting bed placement. Code Status:     Code Status Orders  (From admission, onward)         Start     Ordered   08/14/18 2033  Full code  Continuous     08/14/18 2034        Code Status History    Date Active Date Inactive Code Status Order ID Comments User  Context   01/28/2017 1036 01/31/2017 1538 Full Code [id number]  Franchot Gallo, MD Inpatient    Advance Directive Documentation     Most Recent Value  Type of Advance Directive  Healthcare Power of Attorney  Pre-existing out of facility DNR order (yellow form or pink MOST form)  -  "MOST" Form in Place?  -        IV Access:    Peripheral IV   Procedures and diagnostic studies:   No results found.   Medical Consultants:    None.  Anti-Infectives:   Cipro  Subjective:    Sherry [name] patient denies any abdominal pain no further watery stools.  Objective:    Vitals:   08/15/18 2143 08/16/18 0634 08/16/18 2117 08/17/18 0456  BP: (!) 148/98 (!) 144/89 139/65 (!) 137/91  Pulse: 82 65 64 92  Resp: 18 18 18 18   Temp: 98.6 F (37 C) 98 F (36.7 C) 98.7 F (37.1 C) 98 F (36.7 C)  TempSrc:  Oral Oral   SpO2: 91% 92% 98% 93%  Weight:    48.1 kg  Height:        Intake/Output Summary (Last 24 hours) at 08/17/2018 0821 Last data filed at 08/16/2018 1700 Gross per 24 hour  Intake 2730.76 ml  Output -  Net 2730.76 ml   Filed Weights   08/14/18 1618 08/15/18 0605 08/17/18 0456  Weight: 44.9 kg 44.2 kg 48.1 kg    Exam: General exam: In no acute distress. Respiratory system: Good air movement and clear to auscultation. Cardiovascular system: S1 & S2 heard, RRR.  Gastrointestinal system: Abdomen is nondistended, soft and nontender, ostomy bag in place. Central nervous system: No focal neurological deficits. Extremities: No pedal edema. Skin: No rashes, lesions or ulcers   Data Reviewed:    Labs: Basic Metabolic Panel: Recent Labs  Lab 08/12/18 1635 08/14/18 1821 08/15/18 0701  NA 136 138 139  K 4.9 4.3 4.3  CL 100 100 106  CO2 27 26 25   GLUCOSE 140* 80 80  BUN 25* 33* 28*  CREATININE 0.91 1.12* 0.87  CALCIUM 10.1 10.2 9.3   GFR Estimated Creatinine Clearance: 31.3 mL/min (by C-G formula based on SCr of 0.87 mg/dL). Liver Function  Tests: Recent Labs  Lab 08/12/18 1635 08/14/18 1821 08/15/18 0701  AST 10 13* 12*  ALT 8 12 10   ALKPHOS 81 78 63  BILITOT 0.7 1.2 0.9  PROT 5.8* 5.9* 4.8*  ALBUMIN 3.3* 2.9* 2.4*   Recent Labs  Lab 08/14/18 1821  LIPASE 22   No results for input(s): AMMONIA in the last 168 hours. Coagulation profile No results for input(s): INR, PROTIME in the last 168 hours.  CBC: Recent Labs  Lab 08/12/18 1635 08/14/18 1821 08/15/18 0701  WBC 11.9* 10.5 8.4  NEUTROABS 10.3* 9.4*  --   HGB 12.3 12.9 11.6*  HCT 37.7 40.1 36.2  MCV 91.8 93.0 93.5  PLT 268.0 239 195   Cardiac Enzymes: Recent Labs  Lab 08/14/18 1821 08/15/18 0043 08/15/18 0701 08/15/18 1315  TROPONINI 0.04* 0.03* 0.03* 0.03*   BNP (last 3 results) No results for input(s): PROBNP in the last 8760 hours. CBG: No results for input(s): GLUCAP in the last 168 hours. D-Dimer: No results for input(s): DDIMER in the last 72 hours. Hgb A1c: No results for input(s): HGBA1C in the last 72 hours. Lipid Profile: No results for input(s): CHOL, HDL, LDLCALC, TRIG, CHOLHDL, LDLDIRECT in the last 72 hours. Thyroid function studies: No results for input(s): TSH, T4TOTAL, T3FREE, THYROIDAB in the last 72 hours.  Invalid input(s): FREET3 Anemia work up: No results for input(s): VITAMINB12, FOLATE, FERRITIN, TIBC, IRON, RETICCTPCT in the last 72 hours. Sepsis Labs: Recent Labs  Lab 08/12/18 1635 08/14/18 1821 08/15/18 0701  WBC 11.9* 10.5 8.4  LATICACIDVEN  --  0.9  --    Microbiology Recent Results (from the past 240 hour(s))  Urine culture     Status: Abnormal   Collection Time: 08/14/18  7:44 PM  Result Value Ref Range Status   Specimen Description   Final    URINE, CLEAN CATCH Performed at [facility], [place] [address], [place], [place] [address]    Special Requests   Final    NONE Performed at [facility], [place] [address] [place], [place] [address]    Culture (A)   Final    >=100,000 COLONIES/mL ESCHERICHIA COLI 80,000 COLONIES/mL ENTEROCOCCUS FAECALIS    Report Status 08/17/2018 FINAL  Final   Organism ID, Bacteria ESCHERICHIA COLI (A)  Final   Organism ID, Bacteria ENTEROCOCCUS FAECALIS (A)  Final      Susceptibility   Escherichia coli - MIC*    AMPICILLIN <=2 SENSITIVE Sensitive     CEFAZOLIN <=4 SENSITIVE Sensitive     CEFTRIAXONE 8 SENSITIVE Sensitive     CIPROFLOXACIN <=0.25 SENSITIVE Sensitive     GENTAMICIN <=1 SENSITIVE Sensitive     IMIPENEM 0.5 SENSITIVE Sensitive     NITROFURANTOIN <=16 SENSITIVE Sensitive     TRIMETH/SULFA <=20 SENSITIVE Sensitive     AMPICILLIN/SULBACTAM <=2 SENSITIVE Sensitive  PIP/TAZO <=4 SENSITIVE Sensitive     Extended ESBL NEGATIVE Sensitive     * >=100,000 COLONIES/mL ESCHERICHIA COLI   Enterococcus faecalis - MIC*    AMPICILLIN <=2 SENSITIVE Sensitive     LEVOFLOXACIN 0.5 SENSITIVE Sensitive     NITROFURANTOIN <=16 SENSITIVE Sensitive     VANCOMYCIN 1 SENSITIVE Sensitive     * 80,000 COLONIES/mL ENTEROCOCCUS FAECALIS  Gastrointestinal Panel by PCR , Stool     Status: Abnormal   Collection Time: 08/15/18  2:30 PM  Result Value Ref Range Status   Campylobacter species NOT DETECTED NOT DETECTED Final   Plesimonas shigelloides NOT DETECTED NOT DETECTED Final   Salmonella species NOT DETECTED NOT DETECTED Final   Yersinia enterocolitica NOT DETECTED NOT DETECTED Final   Vibrio species NOT DETECTED NOT DETECTED Final   Vibrio cholerae NOT DETECTED NOT DETECTED Final   Enteroaggregative E coli (EAEC) NOT DETECTED NOT DETECTED Final   Enteropathogenic E coli (EPEC) NOT DETECTED NOT DETECTED Final   Enterotoxigenic E coli (ETEC) DETECTED (A) NOT DETECTED Final    Comment: RESULT CALLED TO, READ BACK BY AND VERIFIED WITH: ASHLEY BROWN 08/16/18 @ 4562  [place] like toxin producing E coli (STEC) NOT DETECTED NOT DETECTED Final   Shigella/Enteroinvasive E coli (EIEC) NOT DETECTED NOT DETECTED Final    Cryptosporidium NOT DETECTED NOT DETECTED Final   Cyclospora cayetanensis NOT DETECTED NOT DETECTED Final   Entamoeba histolytica NOT DETECTED NOT DETECTED Final   Giardia lamblia NOT DETECTED NOT DETECTED Final   Adenovirus F40/41 NOT DETECTED NOT DETECTED Final   Astrovirus NOT DETECTED NOT DETECTED Final   Norovirus GI/GII NOT DETECTED NOT DETECTED Final   Rotavirus A NOT DETECTED NOT DETECTED Final   Sapovirus (I, II, IV, and V) NOT DETECTED NOT DETECTED Final    Comment: Performed at [facility], [place] [address] [place], [place] [address]     Medications:   . atorvastatin  10 mg Oral Daily  . cephALEXin  500 mg Oral Q8H  . darifenacin  7.5 mg Oral Daily  . enoxaparin (LOVENOX) injection  30 mg Subcutaneous Q24H  . feeding supplement (ENSURE ENLIVE)  237 mL Oral BID BM  . levothyroxine  100 mcg Oral QAC breakfast  . memantine  5 mg Oral Daily  . metoprolol succinate  12.5 mg Oral QPM  . QUEtiapine  25 mg Oral QHS   Continuous Infusions: . sodium chloride 100 mL/hr at 08/17/18 0606  . metronidazole 500 mg (08/17/18 0609)     LOS: 2 days   [facility] Pager [phone]  *Please refer to [email or web address], password [email or web address] to get updated schedule on who will round on this patient, as hospitalists switch teams weekly. If 7PM-7AM, please contact night-coverage at [email or web address], password [email or web address] for any overnight needs.  08/17/2018, 8:21 AM

## 2018-08-18 ENCOUNTER — Inpatient Hospital Stay (HOSPITAL_COMMUNITY): Payer: Medicare Other

## 2018-08-18 MED ORDER — SODIUM CHLORIDE 0.9 % IV BOLUS
1000.0000 mL | Freq: Once | INTRAVENOUS | Status: AC
  Administered 2018-08-18: 1000 mL via INTRAVENOUS

## 2018-08-18 MED ORDER — METRONIDAZOLE IN NACL 5-0.79 MG/ML-% IV SOLN
500.0000 mg | Freq: Three times a day (TID) | INTRAVENOUS | Status: DC
  Administered 2018-08-18 – 2018-08-20 (×6): 500 mg via INTRAVENOUS
  Filled 2018-08-18 (×6): qty 100

## 2018-08-18 MED ORDER — CIPROFLOXACIN IN D5W 400 MG/200ML IV SOLN
400.0000 mg | Freq: Two times a day (BID) | INTRAVENOUS | Status: DC
  Administered 2018-08-18 – 2018-08-19 (×2): 400 mg via INTRAVENOUS
  Filled 2018-08-18 (×3): qty 200

## 2018-08-18 NOTE — Progress Notes (Signed)
Assumed care of patient, agree with previous RN assessment

## 2018-08-18 NOTE — Progress Notes (Signed)
PT Cancellation Note  Patient Details Name: Sherry [name] MRN: [id number] DOB: December 31, 1922   Cancelled Treatment:    Reason Eval/Treat Not Completed: Patient declined, no reason specified;Fatigue/lethargy limiting ability to participate - Pt states she is too tired to mobilize at this time. Pt appeared lethargic, and family states pt has been vomiting and having episodes of diarrhea today. Will follow up tomorrow.   Julien Girt, PT, DPT  Pager # [phone]     Kinnedy Mongiello D Mckell Riecke 08/18/2018, 4:40 PM

## 2018-08-18 NOTE — Progress Notes (Signed)
[facility] PROGRESS NOTE    Progress Note  Sherry [name]  XFG:[id number] DOB: 1923/09/18 DOA: 08/14/2018 PCP: Marletta Lor, MD     Brief Narrative:   Sherry [name] is an 84 y.o. female past medical history of colon cancer status post resection with a colostomy bag, bladder cancer, and left renal pelvis with recurrent stent exchange last one on 07/09/2018 by Dr. Alyson Ingles, she is due for stent exchange in 2 weeks, essential hypertension, coronary artery disease and COPD comes into the hospital with several days of nausea and diarrhea, and extreme confusion and anorexic.  Most of the history was obtained from the daughter as the patient cannot provide history.  Assessment/Plan:   Diarrhea: Mild leukocytosis resolved. Continue Cipro and Flagyl.  Change to IV forms.  AKI: Likely pre-renal, resolved with IV hydration.  Nausea and vomiting: Onset of vomiting, see below for further details.  Possible urinary tract infection: Culture data show E. coli sensitive to Cipro, will need to change Cipro to IV  Hypothyroidism Continue current medications.  Essential hypertension She is currently on no antihypertensive medication at home.  Elevated cardiac biomarkers: Levels have remained flat she denies any chest pain or shortness of breath KG showed nonspecific ST segment changes. The echo is pending.  Pressure injury of skin Turn patient every 2 hours.  Vascular dementia without aggressive behavior: Continue Namenda. Use Seroquel at bedtime.  New nausea and vomiting likely due to ileus. KUB done this morning show multiple gas air filled loops in the small bowel. Placed the patient n.p.o., start on IV fluid gentle hydration repeat an x-ray in the morning and monitor his output from ostomy   DVT prophylaxis: Lovenox Family Communication:daughter Disposition Plan/Barrier to D/C: Awaiting bed placement. Code Status:     Code Status Orders  (From admission,  onward)         Start     Ordered   08/14/18 2033  Full code  Continuous     08/14/18 2034        Code Status History    Date Active Date Inactive Code Status Order ID Comments User Context   01/28/2017 1036 01/31/2017 1538 Full Code [id number]  Franchot Gallo, MD Inpatient    Advance Directive Documentation     Most Recent Value  Type of Advance Directive  Healthcare Power of Attorney  Pre-existing out of facility DNR order (yellow form or pink MOST form)  -  "MOST" Form in Place?  -        IV Access:    Peripheral IV   Procedures and diagnostic studies:   Dg Abd 1 View  Result Date: 08/18/2018 CLINICAL DATA:  84 year old female with continued nausea and recently resolved diarrhea EXAM: ABDOMEN - 1 VIEW COMPARISON:  CT scan of the abdomen and pelvis 08/14/2018 FINDINGS: Left double-J ureteral stent appears well positioned. Diffuse gaseous distention of multiple loops of small bowel throughout the abdomen is a new finding compared to the recent prior CT scan. Findings are concerning for bowel obstruction. Similar appearance of scattered punctate calcifications throughout the spleen consistent with old granulomatous disease. Surgical clips are present in the left lower quadrant. Left lower quadrant ostomy. The bones are diffusely demineralized. Atherosclerotic calcifications are present in the abdominal aorta. No acute osseous abnormality. IMPRESSION: Interval development of numerous loops of gas-filled and dilated small bowel throughout the abdomen concerning for small bowel obstruction. Electronically Signed   By: Jacqulynn Cadet M.D.   On: 08/18/2018 10:01  Medical Consultants:    None.  Anti-Infectives:   Cipro and Flagyl  Subjective:    YARELIN REICHARDT she started vomiting this morning with no stool output throughout her stoma  Objective:    Vitals:   08/17/18 0456 08/17/18 2140 08/18/18 0615 08/18/18 0622  BP: (!) 137/91 (!) 140/104 (!) 147/102     Pulse: 92 (!) 101 91   Resp: 18 18 18    Temp: 98 F (36.7 C) 98.8 F (37.1 C) 98.6 F (37 C)   TempSrc:  Oral Oral   SpO2: 93% 94% 96%   Weight: 48.1 kg   49.5 kg  Height:        Intake/Output Summary (Last 24 hours) at 08/18/2018 1111 Last data filed at 08/18/2018 0946 Gross per 24 hour  Intake 2526.82 ml  Output 950 ml  Net 1576.82 ml   Filed Weights   08/15/18 0605 08/17/18 0456 08/18/18 0622  Weight: 44.2 kg 48.1 kg 49.5 kg    Exam: General exam: In no acute distress. Respiratory system: Good air movement and clear to auscultation. Cardiovascular system: S1 & S2 heard, RRR.  Gastrointestinal system: Abdomen is nondistended, soft and nontender, ostomy bag in place. Central nervous system: No focal neurological deficits. Extremities: No pedal edema. Skin: No rashes, lesions or ulcers   Data Reviewed:    Labs: Basic Metabolic Panel: Recent Labs  Lab 08/12/18 1635 08/14/18 1821 08/15/18 0701  NA 136 138 139  K 4.9 4.3 4.3  CL 100 100 106  CO2 27 26 25   GLUCOSE 140* 80 80  BUN 25* 33* 28*  CREATININE 0.91 1.12* 0.87  CALCIUM 10.1 10.2 9.3   GFR Estimated Creatinine Clearance: 32.2 mL/min (by C-G formula based on SCr of 0.87 mg/dL). Liver Function Tests: Recent Labs  Lab 08/12/18 1635 08/14/18 1821 08/15/18 0701  AST 10 13* 12*  ALT 8 12 10   ALKPHOS 81 78 63  BILITOT 0.7 1.2 0.9  PROT 5.8* 5.9* 4.8*  ALBUMIN 3.3* 2.9* 2.4*   Recent Labs  Lab 08/14/18 1821  LIPASE 22   No results for input(s): AMMONIA in the last 168 hours. Coagulation profile No results for input(s): INR, PROTIME in the last 168 hours.  CBC: Recent Labs  Lab 08/12/18 1635 08/14/18 1821 08/15/18 0701  WBC 11.9* 10.5 8.4  NEUTROABS 10.3* 9.4*  --   HGB 12.3 12.9 11.6*  HCT 37.7 40.1 36.2  MCV 91.8 93.0 93.5  PLT 268.0 239 195   Cardiac Enzymes: Recent Labs  Lab 08/14/18 1821 08/15/18 0043 08/15/18 0701 08/15/18 1315  TROPONINI 0.04* 0.03* 0.03* 0.03*   BNP  (last 3 results) No results for input(s): PROBNP in the last 8760 hours. CBG: No results for input(s): GLUCAP in the last 168 hours. D-Dimer: No results for input(s): DDIMER in the last 72 hours. Hgb A1c: No results for input(s): HGBA1C in the last 72 hours. Lipid Profile: No results for input(s): CHOL, HDL, LDLCALC, TRIG, CHOLHDL, LDLDIRECT in the last 72 hours. Thyroid function studies: No results for input(s): TSH, T4TOTAL, T3FREE, THYROIDAB in the last 72 hours.  Invalid input(s): FREET3 Anemia work up: No results for input(s): VITAMINB12, FOLATE, FERRITIN, TIBC, IRON, RETICCTPCT in the last 72 hours. Sepsis Labs: Recent Labs  Lab 08/12/18 1635 08/14/18 1821 08/15/18 0701  WBC 11.9* 10.5 8.4  LATICACIDVEN  --  0.9  --    Microbiology Recent Results (from the past 240 hour(s))  Urine culture     Status: Abnormal   Collection  Time: 08/14/18  7:44 PM  Result Value Ref Range Status   Specimen Description   Final    URINE, CLEAN CATCH Performed at [facility], [place] [address] [place], [place] [address]    Special Requests   Final    NONE Performed at [facility], [place] [address] [place], [place] [address]    Culture (A)  Final    >=100,000 COLONIES/mL ESCHERICHIA COLI 80,000 COLONIES/mL ENTEROCOCCUS FAECALIS    Report Status 08/17/2018 FINAL  Final   Organism ID, Bacteria ESCHERICHIA COLI (A)  Final   Organism ID, Bacteria ENTEROCOCCUS FAECALIS (A)  Final      Susceptibility   Escherichia coli - MIC*    AMPICILLIN <=2 SENSITIVE Sensitive     CEFAZOLIN <=4 SENSITIVE Sensitive     CEFTRIAXONE 8 SENSITIVE Sensitive     CIPROFLOXACIN <=0.25 SENSITIVE Sensitive     GENTAMICIN <=1 SENSITIVE Sensitive     IMIPENEM 0.5 SENSITIVE Sensitive     NITROFURANTOIN <=16 SENSITIVE Sensitive     TRIMETH/SULFA <=20 SENSITIVE Sensitive     AMPICILLIN/SULBACTAM <=2 SENSITIVE Sensitive     PIP/TAZO <=4 SENSITIVE Sensitive     Extended ESBL  NEGATIVE Sensitive     * >=100,000 COLONIES/mL ESCHERICHIA COLI   Enterococcus faecalis - MIC*    AMPICILLIN <=2 SENSITIVE Sensitive     LEVOFLOXACIN 0.5 SENSITIVE Sensitive     NITROFURANTOIN <=16 SENSITIVE Sensitive     VANCOMYCIN 1 SENSITIVE Sensitive     * 80,000 COLONIES/mL ENTEROCOCCUS FAECALIS  Gastrointestinal Panel by PCR , Stool     Status: Abnormal   Collection Time: 08/15/18  2:30 PM  Result Value Ref Range Status   Campylobacter species NOT DETECTED NOT DETECTED Final   Plesimonas shigelloides NOT DETECTED NOT DETECTED Final   Salmonella species NOT DETECTED NOT DETECTED Final   Yersinia enterocolitica NOT DETECTED NOT DETECTED Final   Vibrio species NOT DETECTED NOT DETECTED Final   Vibrio cholerae NOT DETECTED NOT DETECTED Final   Enteroaggregative E coli (EAEC) NOT DETECTED NOT DETECTED Final   Enteropathogenic E coli (EPEC) NOT DETECTED NOT DETECTED Final   Enterotoxigenic E coli (ETEC) DETECTED (A) NOT DETECTED Final    Comment: RESULT CALLED TO, READ BACK BY AND VERIFIED WITH: ASHLEY BROWN 08/16/18 @ 79  [place] like toxin producing E coli (STEC) NOT DETECTED NOT DETECTED Final   Shigella/Enteroinvasive E coli (EIEC) NOT DETECTED NOT DETECTED Final   Cryptosporidium NOT DETECTED NOT DETECTED Final   Cyclospora cayetanensis NOT DETECTED NOT DETECTED Final   Entamoeba histolytica NOT DETECTED NOT DETECTED Final   Giardia lamblia NOT DETECTED NOT DETECTED Final   Adenovirus F40/41 NOT DETECTED NOT DETECTED Final   Astrovirus NOT DETECTED NOT DETECTED Final   Norovirus GI/GII NOT DETECTED NOT DETECTED Final   Rotavirus A NOT DETECTED NOT DETECTED Final   Sapovirus (I, II, IV, and V) NOT DETECTED NOT DETECTED Final    Comment: Performed at [facility], [address] [place], [place] [address]     Medications:   . atorvastatin  10 mg Oral Daily  . ciprofloxacin  500 mg Oral BID  . darifenacin  7.5 mg Oral Daily  . enoxaparin (LOVENOX)  injection  30 mg Subcutaneous Q24H  . feeding supplement (ENSURE ENLIVE)  237 mL Oral BID BM  . levothyroxine  100 mcg Oral QAC breakfast  . memantine  5 mg Oral Daily  . metoprolol succinate  12.5 mg  Oral QPM  . metroNIDAZOLE  500 mg Oral Q8H  . QUEtiapine  25 mg Oral QHS   Continuous Infusions: . sodium chloride 100 mL/hr at 08/18/18 0407     LOS: 3 days   Charlynne Cousins  [facility] Pager [phone]  *Please refer to [email or web address], password [email or web address] to get updated schedule on who will round on this patient, as hospitalists switch teams weekly. If 7PM-7AM, please contact night-coverage at [email or web address], password [email or web address] for any overnight needs.  08/18/2018, 11:11 AM

## 2018-08-18 NOTE — Progress Notes (Signed)
Pharmacy Antibiotic Note  Sherry [name] is a 84 y.o. female admitted on 08/14/2018 with Intra-abdominal infection.  Pharmacy has been consulted for Ciprofloxacin dosing. Patient with Sherry [name] present in GI panel as well as UTI with Ecoli and enterococcus   Plan: Day 4 Abxs 1) Continue current cipro dosing 2) Note Cipro does not cover enterococcus well - would recommend changing Cipro to Levaquin 250mg  PO daily to better cover Ecoli and enterococcus   Height: 5\' 3"  (160 cm) Weight: 109 lb 2 oz (49.5 kg) IBW/kg (Calculated) : 52.4  Temp (24hrs), Avg:98.7 F (37.1 C), Min:98.6 F (37 C), Max:98.8 F (37.1 C)  Recent Labs  Lab 08/12/18 1635 08/14/18 1821 08/15/18 0701  WBC 11.9* 10.5 8.4  CREATININE 0.91 1.12* 0.87  LATICACIDVEN  --  0.9  --     Estimated Creatinine Clearance: 32.2 mL/min (by C-G formula based on SCr of 0.87 mg/dL).    Allergies  Allergen Reactions  . Percocet [Oxycodone-Acetaminophen] Nausea And Vomiting  . Influenza Virus Vacc Split Pf Other (See Comments)    Unknown  But had to be hospitalized -1966  . Sulfamethoxazole-Trimethoprim Nausea And Vomiting  . Hibiclens [Chlorhexidine Gluconate] Rash    Antimicrobials this admission: Ciprofloxacin 08/14/2018 >> Flagyl   08/14/2018 >>  Dose adjustments this admission: -  Microbiology results: -  Thank you for allowing pharmacy to be a part of this patient's care.  Kara Mead 08/18/2018 8:04 AM

## 2018-08-18 NOTE — Progress Notes (Signed)
[facility] PROGRESS NOTE    Progress Note  Sherry [name]  XTA:[id number] DOB: 1923-03-15 DOA: 08/14/2018 PCP: Marletta Lor, MD     Brief Narrative:   Sherry [name] is an 84 y.o. female past medical history of colon cancer status post resection with a colostomy bag, bladder cancer, and left renal pelvis with recurrent stent exchange last one on 07/09/2018 by Dr. Alyson Ingles, she is due for stent exchange in 2 weeks, essential hypertension, coronary artery disease and COPD comes into the hospital with several days of nausea and diarrhea, and extreme confusion and anorexic.  Most of the history was obtained from the daughter as the patient cannot provide history.  Assessment/Plan:   Diarrhea: Leukocytosis has resolved No further diarrhea continue ciprofloxacin. She is awaiting placement.  AKI: Likely pre-renal, resolved with IV hydration.  Nausea and vomiting: Resolved likely due to infectious etiology.  Possible urinary tract infection: Culture data show E. coli sensitive to Cipro we will continue Cipro orally.  Hypothyroidism Continue current medications.  Essential hypertension She is currently on no antihypertensive medication at home.  Elevated cardiac biomarkers: Levels have remained flat she denies any chest pain or shortness of breath KG showed nonspecific ST   Pressure injury of skin Turn patient every 2 hours.  Vascular dementia without aggressive behavior: Continue Namenda. Use Seroquel at bedtime.   DVT prophylaxis: Lovenox Family Communication:daughter Disposition Plan/Barrier to D/C: Awaiting bed placement. Code Status:     Code Status Orders  (From admission, onward)         Start     Ordered   08/14/18 2033  Full code  Continuous     08/14/18 2034        Code Status History    Date Active Date Inactive Code Status Order ID Comments User Context   01/28/2017 1036 01/31/2017 1538 Full Code [id number]  Franchot Gallo, MD  Inpatient    Advance Directive Documentation     Most Recent Value  Type of Advance Directive  Healthcare Power of Attorney  Pre-existing out of facility DNR order (yellow form or pink MOST form)  -  "MOST" Form in Place?  -        IV Access:    Peripheral IV   Procedures and diagnostic studies:   No results found.   Medical Consultants:    None.  Anti-Infectives:   Cipro  Subjective:    Sherry [name] patient denies any abdominal pain no further watery stools.  Objective:    Vitals:   08/17/18 0456 08/17/18 2140 08/18/18 0615 08/18/18 0622  BP: (!) 137/91 (!) 140/104 (!) 147/102   Pulse: 92 (!) 101 91   Resp: 18 18 18    Temp: 98 F (36.7 C) 98.8 F (37.1 C) 98.6 F (37 C)   TempSrc:  Oral Oral   SpO2: 93% 94% 96%   Weight: 48.1 kg   49.5 kg  Height:        Intake/Output Summary (Last 24 hours) at 08/18/2018 0818 Last data filed at 08/18/2018 0735 Gross per 24 hour  Intake 2526.82 ml  Output 450 ml  Net 2076.82 ml   Filed Weights   08/15/18 0605 08/17/18 0456 08/18/18 0622  Weight: 44.2 kg 48.1 kg 49.5 kg    Exam: General exam: In no acute distress. Respiratory system: Good air movement and clear to auscultation. Cardiovascular system: S1 & S2 heard, RRR.  Gastrointestinal system: Abdomen is nondistended, soft and nontender, ostomy bag in place. Central nervous  system: No focal neurological deficits. Extremities: No pedal edema. Skin: No rashes, lesions or ulcers   Data Reviewed:    Labs: Basic Metabolic Panel: Recent Labs  Lab 08/12/18 1635 08/14/18 1821 08/15/18 0701  NA 136 138 139  K 4.9 4.3 4.3  CL 100 100 106  CO2 27 26 25   GLUCOSE 140* 80 80  BUN 25* 33* 28*  CREATININE 0.91 1.12* 0.87  CALCIUM 10.1 10.2 9.3   GFR Estimated Creatinine Clearance: 32.2 mL/min (by C-G formula based on SCr of 0.87 mg/dL). Liver Function Tests: Recent Labs  Lab 08/12/18 1635 08/14/18 1821 08/15/18 0701  AST 10 13* 12*  ALT 8 12 10     ALKPHOS 81 78 63  BILITOT 0.7 1.2 0.9  PROT 5.8* 5.9* 4.8*  ALBUMIN 3.3* 2.9* 2.4*   Recent Labs  Lab 08/14/18 1821  LIPASE 22   No results for input(s): AMMONIA in the last 168 hours. Coagulation profile No results for input(s): INR, PROTIME in the last 168 hours.  CBC: Recent Labs  Lab 08/12/18 1635 08/14/18 1821 08/15/18 0701  WBC 11.9* 10.5 8.4  NEUTROABS 10.3* 9.4*  --   HGB 12.3 12.9 11.6*  HCT 37.7 40.1 36.2  MCV 91.8 93.0 93.5  PLT 268.0 239 195   Cardiac Enzymes: Recent Labs  Lab 08/14/18 1821 08/15/18 0043 08/15/18 0701 08/15/18 1315  TROPONINI 0.04* 0.03* 0.03* 0.03*   BNP (last 3 results) No results for input(s): PROBNP in the last 8760 hours. CBG: No results for input(s): GLUCAP in the last 168 hours. D-Dimer: No results for input(s): DDIMER in the last 72 hours. Hgb A1c: No results for input(s): HGBA1C in the last 72 hours. Lipid Profile: No results for input(s): CHOL, HDL, LDLCALC, TRIG, CHOLHDL, LDLDIRECT in the last 72 hours. Thyroid function studies: No results for input(s): TSH, T4TOTAL, T3FREE, THYROIDAB in the last 72 hours.  Invalid input(s): FREET3 Anemia work up: No results for input(s): VITAMINB12, FOLATE, FERRITIN, TIBC, IRON, RETICCTPCT in the last 72 hours. Sepsis Labs: Recent Labs  Lab 08/12/18 1635 08/14/18 1821 08/15/18 0701  WBC 11.9* 10.5 8.4  LATICACIDVEN  --  0.9  --    Microbiology Recent Results (from the past 240 hour(s))  Urine culture     Status: Abnormal   Collection Time: 08/14/18  7:44 PM  Result Value Ref Range Status   Specimen Description   Final    URINE, CLEAN CATCH Performed at [facility], [place] [address], [place], [place] [address]    Special Requests   Final    NONE Performed at [facility], [place] [address], [place], [place] [address]    Culture (A)  Final    >=100,000 COLONIES/mL ESCHERICHIA COLI 80,000 COLONIES/mL ENTEROCOCCUS FAECALIS    Report  Status 08/17/2018 FINAL  Final   Organism ID, Bacteria ESCHERICHIA COLI (A)  Final   Organism ID, Bacteria ENTEROCOCCUS FAECALIS (A)  Final      Susceptibility   Escherichia coli - MIC*    AMPICILLIN <=2 SENSITIVE Sensitive     CEFAZOLIN <=4 SENSITIVE Sensitive     CEFTRIAXONE 8 SENSITIVE Sensitive     CIPROFLOXACIN <=0.25 SENSITIVE Sensitive     GENTAMICIN <=1 SENSITIVE Sensitive     IMIPENEM 0.5 SENSITIVE Sensitive     NITROFURANTOIN <=16 SENSITIVE Sensitive     TRIMETH/SULFA <=20 SENSITIVE Sensitive     AMPICILLIN/SULBACTAM <=2 SENSITIVE Sensitive     PIP/TAZO <=4 SENSITIVE Sensitive     Extended ESBL  NEGATIVE Sensitive     * >=100,000 COLONIES/mL ESCHERICHIA COLI   Enterococcus faecalis - MIC*    AMPICILLIN <=2 SENSITIVE Sensitive     LEVOFLOXACIN 0.5 SENSITIVE Sensitive     NITROFURANTOIN <=16 SENSITIVE Sensitive     VANCOMYCIN 1 SENSITIVE Sensitive     * 80,000 COLONIES/mL ENTEROCOCCUS FAECALIS  Gastrointestinal Panel by PCR , Stool     Status: Abnormal   Collection Time: 08/15/18  2:30 PM  Result Value Ref Range Status   Campylobacter species NOT DETECTED NOT DETECTED Final   Plesimonas shigelloides NOT DETECTED NOT DETECTED Final   Salmonella species NOT DETECTED NOT DETECTED Final   Yersinia enterocolitica NOT DETECTED NOT DETECTED Final   Vibrio species NOT DETECTED NOT DETECTED Final   Vibrio cholerae NOT DETECTED NOT DETECTED Final   Enteroaggregative E coli (EAEC) NOT DETECTED NOT DETECTED Final   Enteropathogenic E coli (EPEC) NOT DETECTED NOT DETECTED Final   Enterotoxigenic E coli (ETEC) DETECTED (A) NOT DETECTED Final    Comment: RESULT CALLED TO, READ BACK BY AND VERIFIED WITH: ASHLEY BROWN 08/16/18 @ 2878  Kennedy like toxin producing E coli (STEC) NOT DETECTED NOT DETECTED Final   Shigella/Enteroinvasive E coli (EIEC) NOT DETECTED NOT DETECTED Final   Cryptosporidium NOT DETECTED NOT DETECTED Final   Cyclospora cayetanensis NOT DETECTED NOT DETECTED  Final   Entamoeba histolytica NOT DETECTED NOT DETECTED Final   Giardia lamblia NOT DETECTED NOT DETECTED Final   Adenovirus F40/41 NOT DETECTED NOT DETECTED Final   Astrovirus NOT DETECTED NOT DETECTED Final   Norovirus GI/GII NOT DETECTED NOT DETECTED Final   Rotavirus A NOT DETECTED NOT DETECTED Final   Sapovirus (I, II, IV, and V) NOT DETECTED NOT DETECTED Final    Comment: Performed at [facility], [address] [place], [place] [address]     Medications:   . atorvastatin  10 mg Oral Daily  . ciprofloxacin  500 mg Oral BID  . darifenacin  7.5 mg Oral Daily  . enoxaparin (LOVENOX) injection  30 mg Subcutaneous Q24H  . feeding supplement (ENSURE ENLIVE)  237 mL Oral BID BM  . levothyroxine  100 mcg Oral QAC breakfast  . memantine  5 mg Oral Daily  . metoprolol succinate  12.5 mg Oral QPM  . metroNIDAZOLE  500 mg Oral Q8H  . QUEtiapine  25 mg Oral QHS   Continuous Infusions: . sodium chloride 100 mL/hr at 08/18/18 0407     LOS: 3 days   Charlynne Cousins  [facility] Pager [phone]  *Please refer to [email or web address], password [email or web address] to get updated schedule on who will round on this patient, as hospitalists switch teams weekly. If 7PM-7AM, please contact night-coverage at [email or web address], password [email or web address] for any overnight needs.  08/18/2018, 8:18 AM

## 2018-08-18 NOTE — Clinical Social Work Placement (Addendum)
12:00- updated facility and daughter cancelled DC. Will follow   Pt admitting to [facility] for short term rehab- [phone] for report. Discussed with pt's daughter and she selected facility and is going to SNF to complete admission paperwork. (daughter reiterates hope for long term care following rehab depending on pt's progress, unsure if they will need SNF or ALF for pt- CSW advised her to keep investigating facility options in area and discuss with care team at [facility].) Facility advised need of DC summary with current date- will send via the [facility]. [facility] transport will be arranged.   CLINICAL SOCIAL WORK PLACEMENT  NOTE  Date:  08/18/2018  Patient Details  Name: Sherry [name] MRN: [id number] Date of Birth: [birth year]  Clinical Social Work is seeking post-discharge placement for this patient at the [place] level of care (*CSW will initial, date and re-position this form in  chart as items are completed):  Yes   Patient/family provided with [facility] Work Department's list of facilities offering this level of care within the geographic area requested by the patient (or if unable, by the patient's family).  Yes   Patient/family informed of their freedom to choose among providers that offer the needed level of care, that participate in Medicare, Medicaid or managed care program needed by the patient, have an available bed and are willing to accept the patient.  Yes   Patient/family informed of [facility]'s ownership interest in [facility] and [facility], as well as of the fact that they are under no obligation to receive care at these facilities.  PASRR submitted to EDS on       PASRR number received on       Existing PASRR number confirmed on 08/16/18     FL2 transmitted to all facilities in geographic area requested by pt/family on 08/16/18     FL2 transmitted to all facilities within larger geographic area on       Patient  informed that his/her managed care company has contracts with or will negotiate with certain facilities, including the following:        Yes   Patient/family informed of bed offers received.  Patient chooses bed at [facility]     Physician recommends and patient chooses bed at [facility]    Patient to be transferred to [facility] on 08/18/18.  Patient to be transferred to facility by [facility]     Patient family notified on 08/18/18 of transfer.  Name of family member notified:  daughter [place]     PHYSICIAN       Additional Comment:    _______________________________________________ Nila Nephew, LCSW 08/18/2018, 9:41 AM  [phone]

## 2018-08-18 NOTE — Discharge Summary (Signed)
Physician Discharge Summary  Sherry [name] IRC:[id number] DOB: 1923/10/30 DOA: 08/14/2018  PCP: Marletta Lor, MD  Admit date: 08/14/2018 Discharge date: 08/18/2018  Admitted From: home Disposition:  SNF  Recommendations for Outpatient Follow-up:  1. Follow up with PCP in 1-2 weeks 2. Will go to SNF  Home Health:No Equipment/Devices:none  Discharge Condition:stable CODE STATUS:full Diet recommendation: Heart Healthy  Brief/Interim Summary: 84 y.o. female past medical history of colon cancer status post resection with a colostomy bag, bladder cancer, and left renal pelvis with recurrent stent exchange last one on 07/09/2018 by Dr. Alyson Ingles, she is due for stent exchange in 2 weeks, essential hypertension, coronary artery disease and COPD comes into the hospital with several days of nausea and diarrhea, and extreme confusion and anorexic.  Most of the history was obtained from the daughter as the patient cannot provide history.  Discharge Diagnoses:  Active Problems:   Hypothyroidism   Essential hypertension   Diarrhea   Pressure injury of skin   Possible acute lower UTI   AKI (acute kidney injury) ([place])   Protein-calorie malnutrition, severe   Infectious colitis, enteritis and gastroenteritis  Diarrhea: Mild leukocytosis resolved. GI pathogen C. difficile remain negative.  AKI: Likely pre-renal, resolved with IV hydration.  Nausea and vomiting: Use Zofran.  Possible urinary tract infection: Culture data grew E. coli with more than 100,000 colonies she was changed to oral Cipro which she will continue as an outpatient for 2 additional days.  Hypothyroidism Continue current medications.  Essential hypertension She is currently on no antihypertensive medication at home.  Elevated cardiac biomarkers: Levels have remained flat she denies any chest pain or shortness of breath EKG showed nonspecific ST segment changes. The echo showed no acute  findings  Pressure injury of skin Turn patient every 2 hours.  Vascular dementia without aggressive behavior:   Discharge Instructions  Discharge Instructions    Diet - low sodium heart healthy   Complete by:  As directed    Increase activity slowly   Complete by:  As directed      Allergies as of 08/18/2018      Reactions   Percocet [oxycodone-acetaminophen] Nausea And Vomiting   Influenza Virus Vacc Split Pf Other (See Comments)   Unknown  But had to be hospitalized -1966   Sulfamethoxazole-trimethoprim Nausea And Vomiting   Hibiclens [chlorhexidine Gluconate] Rash      Medication List    TAKE these medications   acetaminophen 650 MG CR tablet Commonly known as:  TYLENOL Take 1,300 mg by mouth daily as needed for pain.   atorvastatin 10 MG tablet Commonly known as:  LIPITOR TAKE 1 TABLET DAILY   ciprofloxacin 500 MG tablet Commonly known as:  CIPRO Take 1 tablet (500 mg total) by mouth 2 (two) times daily for 4 days.   diphenoxylate-atropine 2.5-0.025 MG tablet Commonly known as:  LOMOTIL Take 1 tablet by mouth 4 (four) times daily as needed for diarrhea or loose stools.   levothyroxine 100 MCG tablet Commonly known as:  SYNTHROID, LEVOTHROID Take 100 mcg by mouth daily before breakfast.   levothyroxine 75 MCG tablet Commonly known as:  SYNTHROID, LEVOTHROID Take 1 tablet (75 mcg total) by mouth daily.   ondansetron 4 MG tablet Commonly known as:  ZOFRAN Take 1 tablet (4 mg total) by mouth every 8 (eight) hours as needed for nausea or vomiting.   PROAIR HFA 108 (90 Base) MCG/ACT inhaler Generic drug:  albuterol USE 2 INHALATIONS INTO THE LUNGS EVERY 4 HOURS  AS NEEDED FOR SHORTNESS OF BREATH What changed:  See the new instructions.   TOPROL XL 25 MG 24 hr tablet Generic drug:  metoprolol succinate TAKE ONE-HALF (1/2) TABLET DAILY What changed:    how much to take  how to take this  when to take this  additional instructions    traMADol-acetaminophen 37.5-325 MG tablet Commonly known as:  ULTRACET Take 1 tablet by mouth every 6 (six) hours as needed for moderate pain.   Trospium Chloride 60 MG Cp24 Take 1 capsule by mouth daily.       Allergies  Allergen Reactions  . Percocet [Oxycodone-Acetaminophen] Nausea And Vomiting  . Influenza Virus Vacc Split Pf Other (See Comments)    Unknown  But had to be hospitalized -1966  . Sulfamethoxazole-Trimethoprim Nausea And Vomiting  . Hibiclens [Chlorhexidine Gluconate] Rash    Consultations:  None   Procedures/Studies: Ct Abdomen Pelvis Wo Contrast  Result Date: 08/14/2018 CLINICAL DATA:  Vomiting and diarrhea for several days. EXAM: CT ABDOMEN AND PELVIS WITHOUT CONTRAST TECHNIQUE: Multidetector CT imaging of the abdomen and pelvis was performed following the standard protocol without IV contrast. COMPARISON:  08/06/2018 FINDINGS: Lower chest: Tortuosity and calcific atherosclerotic disease of the aorta. Calcific atherosclerotic disease of the coronary arteries. Mitral valve annular calcifications. Hepatobiliary: 6 mm hypoattenuated focus in the dome of the liver, too small to be actually characterize by CT. Scattered granulomata. Pancreas: Unremarkable. No pancreatic ductal dilatation or surrounding inflammatory changes. Spleen: Scattered granulomata. Adrenals/Urinary Tract: Normal adrenal glands. Punctate nonobstructive bilateral renal calculi. No evidence of hydronephrosis. Left renal scarring. Left ureteral double-J stent stable with proximal and in the left renal pelvis and distal end within the urinary bladder. Stomach/Bowel: No evidence of small-bowel obstruction. Moderate amount of formed stool in the proximal colon. Ostomy in the left lower quadrant without evidence of parastomal herniation. Diffuse pericolonic inflammatory changes of the loops upstream to the ostomy. Postsurgical changes from resection of the rectum. Vascular/Lymphatic: Extensive aortic  atherosclerosis. Known infrarenal abdominal aortic aneurysm is stable measuring 5.1 x 5.0 cm. No periaortic free fluid. No evidence of lymphadenopathy. Reproductive: Status post hysterectomy. No adnexal masses. Other: No abdominal wall hernia or abnormality. No abdominopelvic ascites. Musculoskeletal: Stable 4 mm anterolisthesis of L4 on L5. Spondylosis at L4-L5. IMPRESSION: Interval development of mucosal thickening and pericolonic inflammatory changes of a long segment of the colon, upstream to the left lower quadrant colostomy. Moderate stool burden. Otherwise stable findings: Left double-J ureteral catheter. Known infrarenal abdominal aortic aneurysm, measuring 5.1 cm in greatest diameter. Marked atherosclerotic disease of the aorta. Electronically Signed   By: Fidela Salisbury M.D.   On: 08/14/2018 19:25   Dg Chest 2 View  Result Date: 08/14/2018 CLINICAL DATA:  Vomiting and diarrhea. EXAM: CHEST - 2 VIEW COMPARISON:  11/21/2015 FINDINGS: The cardiac silhouette is normal. Apparent enlargement of the contour of the ascending aorta. Calcific atherosclerotic disease and tortuosity of the aorta. Thickening of the paratracheal stripe bilaterally. There is no evidence of focal airspace consolidation, pleural effusion or pneumothorax. Osseous structures are without acute abnormality. Soft tissues are grossly normal. IMPRESSION: Apparent enlargement of the contour of the ascending aorta. Aneurysmal dilation cannot be excluded. Thickening of the paratracheal stripe bilaterally. This may represent enlargement of the thyroid gland or other space-occupying lesion within the mediastinum. Electronically Signed   By: Fidela Salisbury M.D.   On: 08/14/2018 18:30   Dg Abd 1 View  Result Date: 08/18/2018 CLINICAL DATA:  84 year old female with continued nausea and recently  resolved diarrhea EXAM: ABDOMEN - 1 VIEW COMPARISON:  CT scan of the abdomen and pelvis 08/14/2018 FINDINGS: Left double-J ureteral stent appears  well positioned. Diffuse gaseous distention of multiple loops of small bowel throughout the abdomen is a new finding compared to the recent prior CT scan. Findings are concerning for bowel obstruction. Similar appearance of scattered punctate calcifications throughout the spleen consistent with old granulomatous disease. Surgical clips are present in the left lower quadrant. Left lower quadrant ostomy. The bones are diffusely demineralized. Atherosclerotic calcifications are present in the abdominal aorta. No acute osseous abnormality. IMPRESSION: Interval development of numerous loops of gas-filled and dilated small bowel throughout the abdomen concerning for small bowel obstruction. Electronically Signed   By: Jacqulynn Cadet M.D.   On: 08/18/2018 10:01   Ct Head Wo Contrast  Result Date: 08/14/2018 CLINICAL DATA:  Altered mental status EXAM: CT HEAD WITHOUT CONTRAST TECHNIQUE: Contiguous axial images were obtained from the base of the skull through the vertex without intravenous contrast. COMPARISON:  None. FINDINGS: Brain: Diffuse cerebral atrophy. Mild chronic small vessel disease. No acute intracranial abnormality. Specifically, no hemorrhage, hydrocephalus, mass lesion, acute infarction, or significant intracranial injury. Vascular: No hyperdense vessel or unexpected calcification. Skull: No acute calvarial abnormality. Sinuses/Orbits: No acute finding Other: None IMPRESSION: No acute intracranial abnormality. Atrophy, chronic microvascular disease. Electronically Signed   By: Rolm Baptise M.D.   On: 08/14/2018 19:13   Ct Renal Stone Study  Result Date: 08/06/2018 CLINICAL DATA:  Flank pain. History of renal calculi. Known double-J stent on the left. History of rectal carcinoma EXAM: CT ABDOMEN AND PELVIS WITHOUT CONTRAST TECHNIQUE: Multidetector CT imaging of the abdomen and pelvis was performed following the standard protocol without intravenous contrast. Oral contrast was administered.  COMPARISON:  December 17, 2017 FINDINGS: Lower chest: Lung bases are clear. There are foci of coronary artery calcification. Hepatobiliary: There are calcified granulomas throughout the liver. Occasional subcentimeter presumed cysts are stable. No new liver lesion is evident. Gallbladder is absent. There is no appreciable biliary duct dilatation. Pancreas: There is no evident pancreatic mass or inflammatory focus. Spleen: There are granulomas throughout the spleen. No focal splenic lesions beyond calcified granulomas are evident. Adrenals/Urinary Tract: Adrenals appear unremarkable bilaterally. There is a cyst in the medial upper pole right kidney measuring 1.5 x 1.5 cm. There is a mass along the lateral aspect of the right kidney measuring approximately 1 x 1 cm which has attenuation values suggesting probable mildly complicated cyst. There is a apparent hyperdense cyst along the lateral midportion left kidney. There is a double-J stent on the left extending from the left renal pelvis to the bladder. There are several tiny calculi in the lower pole left kidney. There is a 1 mm calculus in the lower pole the right kidney. Urinary bladder is midline with wall thickness within normal limits. Stomach/Bowel: There is fluid throughout the bowel with moderate stool in the colon. There is no appreciable bowel wall thickening or bowel dilatation. No free air evident. There is an ostomy in the left lower quadrant with evidence of parastomal herniation within this ostomy. The patient has had previous removal of the rectum. There is no evident bowel obstruction. No free air or portal venous air. Vascular/Lymphatic: There is extensive aortic and iliac artery atherosclerosis. There is an aneurysm of the distal abdominal aorta which currently measures 5.2 x 5.2 cm, increased in size compared to most recent study. There is no periaortic fluid. There is no adenopathy evident in the abdomen  or pelvis. Reproductive: Uterus is absent.   No pelvic masses evident. Other: There is no periappendiceal region inflammatory change. There is no abscess or ascites in the abdomen or pelvis. Musculoskeletal: There is 4 mm of anterolisthesis of L4 on L5. There is vacuum phenomenon at L4-5. There are no blastic or lytic bone lesions. No intramuscular lesions are evident. IMPRESSION: 1. Double-J stent on the left extending from the left renal pelvis to the bladder. There are tiny calculi in the lower pole left kidney. Double-J stent potentially could obscure a somewhat larger calculus. There is also a 1 mm nonobstructing calculus in the lower pole of the right kidney. There is no evident hydronephrosis on either side. 2. Patient has had surgical removal of the rectum. There is a left lower quadrant ostomy with a stable peristomal hernia. No bowel obstruction evident. No abscess in the abdomen or pelvis. No periappendiceal region inflammation. Fluid is noted throughout most of the bowel. This finding may indicate a degree of ileus or enteritis. 3. Abdominal aortic aneurysm measuring 5.2 x 5.2 cm, increased in size from January 2019, at which time it measured 4.6 x 4.6 cm. This change may warrant vascular surgery consultation. 4. Evidence of prior granulomatous disease with granulomas in the liver and spleen. 5.  Gallbladder absent. Aortic aneurysm NOS (ICD10-I71.9). Aortic Atherosclerosis (ICD10-I70.0). Electronically Signed   By: Lowella Grip III M.D.   On: 08/06/2018 18:32     Subjective:  No complains tolerating her diet Discharge Exam: Vitals:   08/17/18 2140 08/18/18 0615  BP: (!) 140/104 (!) 147/102  Pulse: (!) 101 91  Resp: 18 18  Temp: 98.8 F (37.1 C) 98.6 F (37 C)  SpO2: 94% 96%   Vitals:   08/17/18 0456 08/17/18 2140 08/18/18 0615 08/18/18 0622  BP: (!) 137/91 (!) 140/104 (!) 147/102   Pulse: 92 (!) 101 91   Resp: 18 18 18    Temp: 98 F (36.7 C) 98.8 F (37.1 C) 98.6 F (37 C)   TempSrc:  Oral Oral   SpO2: 93% 94% 96%    Weight: 48.1 kg   49.5 kg  Height:        General: Pt is alert, awake, not in acute distress Cardiovascular: RRR, S1/S2 +, no rubs, no gallops Respiratory: CTA bilaterally, no wheezing, no rhonchi Abdominal: Soft, NT, ND, bowel sounds + Extremities: no edema, no cyanosis    The results of significant diagnostics from this hospitalization (including imaging, microbiology, ancillary and laboratory) are listed below for reference.     Microbiology: Recent Results (from the past 240 hour(s))  Urine culture     Status: Abnormal   Collection Time: 08/14/18  7:44 PM  Result Value Ref Range Status   Specimen Description   Final    URINE, CLEAN CATCH Performed at [facility], [place] [address], [place], [place] [address]    Special Requests   Final    NONE Performed at [facility], [place] [address], [place], [place] [address]    Culture (A)  Final    >=100,000 COLONIES/mL ESCHERICHIA COLI 80,000 COLONIES/mL ENTEROCOCCUS FAECALIS    Report Status 08/17/2018 FINAL  Final   Organism ID, Bacteria ESCHERICHIA COLI (A)  Final   Organism ID, Bacteria ENTEROCOCCUS FAECALIS (A)  Final      Susceptibility   Escherichia coli - MIC*    AMPICILLIN <=2 SENSITIVE Sensitive     CEFAZOLIN <=4 SENSITIVE Sensitive     CEFTRIAXONE 8 SENSITIVE Sensitive  CIPROFLOXACIN <=0.25 SENSITIVE Sensitive     GENTAMICIN <=1 SENSITIVE Sensitive     IMIPENEM 0.5 SENSITIVE Sensitive     NITROFURANTOIN <=16 SENSITIVE Sensitive     TRIMETH/SULFA <=20 SENSITIVE Sensitive     AMPICILLIN/SULBACTAM <=2 SENSITIVE Sensitive     PIP/TAZO <=4 SENSITIVE Sensitive     Extended ESBL NEGATIVE Sensitive     * >=100,000 COLONIES/mL ESCHERICHIA COLI   Enterococcus faecalis - MIC*    AMPICILLIN <=2 SENSITIVE Sensitive     LEVOFLOXACIN 0.5 SENSITIVE Sensitive     NITROFURANTOIN <=16 SENSITIVE Sensitive     VANCOMYCIN 1 SENSITIVE Sensitive     * 80,000 COLONIES/mL ENTEROCOCCUS FAECALIS   Gastrointestinal Panel by PCR , Stool     Status: Abnormal   Collection Time: 08/15/18  2:30 PM  Result Value Ref Range Status   Campylobacter species NOT DETECTED NOT DETECTED Final   Plesimonas shigelloides NOT DETECTED NOT DETECTED Final   Salmonella species NOT DETECTED NOT DETECTED Final   Yersinia enterocolitica NOT DETECTED NOT DETECTED Final   Vibrio species NOT DETECTED NOT DETECTED Final   Vibrio cholerae NOT DETECTED NOT DETECTED Final   Enteroaggregative E coli (EAEC) NOT DETECTED NOT DETECTED Final   Enteropathogenic E coli (EPEC) NOT DETECTED NOT DETECTED Final   Enterotoxigenic E coli (ETEC) DETECTED (A) NOT DETECTED Final    Comment: RESULT CALLED TO, READ BACK BY AND VERIFIED WITH: ASHLEY BROWN 08/16/18 @ 2119  [place] like toxin producing E coli (STEC) NOT DETECTED NOT DETECTED Final   Shigella/Enteroinvasive E coli (EIEC) NOT DETECTED NOT DETECTED Final   Cryptosporidium NOT DETECTED NOT DETECTED Final   Cyclospora cayetanensis NOT DETECTED NOT DETECTED Final   Entamoeba histolytica NOT DETECTED NOT DETECTED Final   Giardia lamblia NOT DETECTED NOT DETECTED Final   Adenovirus F40/41 NOT DETECTED NOT DETECTED Final   Astrovirus NOT DETECTED NOT DETECTED Final   Norovirus GI/GII NOT DETECTED NOT DETECTED Final   Rotavirus A NOT DETECTED NOT DETECTED Final   Sapovirus (I, II, IV, and V) NOT DETECTED NOT DETECTED Final    Comment: Performed at [facility], [address] [place], [place] [address]     Labs: BNP (last 3 results) No results for input(s): BNP in the last 8760 hours. Basic Metabolic Panel: Recent Labs  Lab 08/12/18 1635 08/14/18 1821 08/15/18 0701  NA 136 138 139  K 4.9 4.3 4.3  CL 100 100 106  CO2 27 26 25   GLUCOSE 140* 80 80  BUN 25* 33* 28*  CREATININE 0.91 1.12* 0.87  CALCIUM 10.1 10.2 9.3   Liver Function Tests: Recent Labs  Lab 08/12/18 1635 08/14/18 1821 08/15/18 0701  AST 10 13* 12*  ALT 8 12 10   ALKPHOS 81  78 63  BILITOT 0.7 1.2 0.9  PROT 5.8* 5.9* 4.8*  ALBUMIN 3.3* 2.9* 2.4*   Recent Labs  Lab 08/14/18 1821  LIPASE 22   No results for input(s): AMMONIA in the last 168 hours. CBC: Recent Labs  Lab 08/12/18 1635 08/14/18 1821 08/15/18 0701  WBC 11.9* 10.5 8.4  NEUTROABS 10.3* 9.4*  --   HGB 12.3 12.9 11.6*  HCT 37.7 40.1 36.2  MCV 91.8 93.0 93.5  PLT 268.0 239 195   Cardiac Enzymes: Recent Labs  Lab 08/14/18 1821 08/15/18 0043 08/15/18 0701 08/15/18 1315  TROPONINI 0.04* 0.03* 0.03* 0.03*   BNP: Invalid input(s): POCBNP CBG: No results for input(s): GLUCAP in the last 168 hours. D-Dimer No  results for input(s): DDIMER in the last 72 hours. Hgb A1c No results for input(s): HGBA1C in the last 72 hours. Lipid Profile No results for input(s): CHOL, HDL, LDLCALC, TRIG, CHOLHDL, LDLDIRECT in the last 72 hours. Thyroid function studies No results for input(s): TSH, T4TOTAL, T3FREE, THYROIDAB in the last 72 hours.  Invalid input(s): FREET3 Anemia work up No results for input(s): VITAMINB12, FOLATE, FERRITIN, TIBC, IRON, RETICCTPCT in the last 72 hours. Urinalysis    Component Value Date/Time   COLORURINE AMBER (A) 08/14/2018 1944   APPEARANCEUR CLOUDY (A) 08/14/2018 1944   LABSPEC 1.018 08/14/2018 1944   LABSPEC 1.030 12/06/2009 1628   PHURINE 5.0 08/14/2018 1944   GLUCOSEU NEGATIVE 08/14/2018 1944   HGBUR MODERATE (A) 08/14/2018 1944   BILIRUBINUR NEGATIVE 08/14/2018 1944   BILIRUBINUR 1+ 02/10/2016 1638   BILIRUBINUR Negative 12/06/2009 1628   KETONESUR 20 (A) 08/14/2018 1944   PROTEINUR 100 (A) 08/14/2018 1944   UROBILINOGEN 1.0 02/10/2016 1638   UROBILINOGEN 1.0 04/01/2010 1500   NITRITE POSITIVE (A) 08/14/2018 1944   LEUKOCYTESUR LARGE (A) 08/14/2018 1944   LEUKOCYTESUR Small 12/06/2009 1628   Sepsis Labs Invalid input(s): PROCALCITONIN,  WBC,  LACTICIDVEN Microbiology Recent Results (from the past 240 hour(s))  Urine culture     Status: Abnormal    Collection Time: 08/14/18  7:44 PM  Result Value Ref Range Status   Specimen Description   Final    URINE, CLEAN CATCH Performed at [facility], [place] [address], [place], [place] [address]    Special Requests   Final    NONE Performed at [facility], [place] [address], [place], [place] [address]    Culture (A)  Final    >=100,000 COLONIES/mL ESCHERICHIA COLI 80,000 COLONIES/mL ENTEROCOCCUS FAECALIS    Report Status 08/17/2018 FINAL  Final   Organism ID, Bacteria ESCHERICHIA COLI (A)  Final   Organism ID, Bacteria ENTEROCOCCUS FAECALIS (A)  Final      Susceptibility   Escherichia coli - MIC*    AMPICILLIN <=2 SENSITIVE Sensitive     CEFAZOLIN <=4 SENSITIVE Sensitive     CEFTRIAXONE 8 SENSITIVE Sensitive     CIPROFLOXACIN <=0.25 SENSITIVE Sensitive     GENTAMICIN <=1 SENSITIVE Sensitive     IMIPENEM 0.5 SENSITIVE Sensitive     NITROFURANTOIN <=16 SENSITIVE Sensitive     TRIMETH/SULFA <=20 SENSITIVE Sensitive     AMPICILLIN/SULBACTAM <=2 SENSITIVE Sensitive     PIP/TAZO <=4 SENSITIVE Sensitive     Extended ESBL NEGATIVE Sensitive     * >=100,000 COLONIES/mL ESCHERICHIA COLI   Enterococcus faecalis - MIC*    AMPICILLIN <=2 SENSITIVE Sensitive     LEVOFLOXACIN 0.5 SENSITIVE Sensitive     NITROFURANTOIN <=16 SENSITIVE Sensitive     VANCOMYCIN 1 SENSITIVE Sensitive     * 80,000 COLONIES/mL ENTEROCOCCUS FAECALIS  Gastrointestinal Panel by PCR , Stool     Status: Abnormal   Collection Time: 08/15/18  2:30 PM  Result Value Ref Range Status   Campylobacter species NOT DETECTED NOT DETECTED Final   Plesimonas shigelloides NOT DETECTED NOT DETECTED Final   Salmonella species NOT DETECTED NOT DETECTED Final   Yersinia enterocolitica NOT DETECTED NOT DETECTED Final   Vibrio species NOT DETECTED NOT DETECTED Final   Vibrio cholerae NOT DETECTED NOT DETECTED Final   Enteroaggregative E coli (EAEC) NOT DETECTED NOT DETECTED Final   Enteropathogenic  E coli (EPEC) NOT DETECTED NOT DETECTED Final   Enterotoxigenic E coli (ETEC) DETECTED (A) NOT  DETECTED Final    Comment: RESULT CALLED TO, READ BACK BY AND VERIFIED WITH: ASHLEY BROWN 08/16/18 @ 76  [place] like toxin producing E coli (STEC) NOT DETECTED NOT DETECTED Final   Shigella/Enteroinvasive E coli (EIEC) NOT DETECTED NOT DETECTED Final   Cryptosporidium NOT DETECTED NOT DETECTED Final   Cyclospora cayetanensis NOT DETECTED NOT DETECTED Final   Entamoeba histolytica NOT DETECTED NOT DETECTED Final   Giardia lamblia NOT DETECTED NOT DETECTED Final   Adenovirus F40/41 NOT DETECTED NOT DETECTED Final   Astrovirus NOT DETECTED NOT DETECTED Final   Norovirus GI/GII NOT DETECTED NOT DETECTED Final   Rotavirus A NOT DETECTED NOT DETECTED Final   Sapovirus (I, II, IV, and V) NOT DETECTED NOT DETECTED Final    Comment: Performed at [facility], [address], [place], [place] [address]     Time coordinating discharge:40 minutes  SIGNED:   Charlynne Cousins, MD  [facility] 08/18/2018, 10:32 AM Pager   If 7PM-7AM, please contact night-coverage [email or web address] Password [email or web address]

## 2018-08-19 ENCOUNTER — Inpatient Hospital Stay (HOSPITAL_COMMUNITY): Payer: Medicare Other

## 2018-08-19 ENCOUNTER — Ambulatory Visit: Payer: Medicare Other | Admitting: Internal Medicine

## 2018-08-19 ENCOUNTER — Encounter (HOSPITAL_COMMUNITY): Payer: Self-pay

## 2018-08-19 DIAGNOSIS — K529 Noninfective gastroenteritis and colitis, unspecified: Secondary | ICD-10-CM

## 2018-08-19 DIAGNOSIS — K567 Ileus, unspecified: Secondary | ICD-10-CM

## 2018-08-19 LAB — CBC
HCT: 36.7 % (ref 36.0–46.0)
Hemoglobin: 11.8 g/dL — ABNORMAL LOW (ref 12.0–15.0)
MCH: 29.5 pg (ref 26.0–34.0)
MCHC: 32.2 g/dL (ref 30.0–36.0)
MCV: 91.8 fL (ref 78.0–100.0)
Platelets: 207 10*3/uL (ref 150–400)
RBC: 4 MIL/uL (ref 3.87–5.11)
RDW: 15.6 % — AB (ref 11.5–15.5)
WBC: 10.7 10*3/uL — AB (ref 4.0–10.5)

## 2018-08-19 LAB — BASIC METABOLIC PANEL
ANION GAP: 9 (ref 5–15)
BUN: 11 mg/dL (ref 8–23)
CALCIUM: 8.7 mg/dL — AB (ref 8.9–10.3)
CO2: 19 mmol/L — ABNORMAL LOW (ref 22–32)
Chloride: 111 mmol/L (ref 98–111)
Creatinine, Ser: 0.72 mg/dL (ref 0.44–1.00)
GFR calc Af Amer: >60 mL/min (ref >60)
GFR calc non Af Amer: >60 mL/min (ref >60)
GLUCOSE: 83 mg/dL (ref 70–99)
POTASSIUM: 3.5 mmol/L (ref 3.5–5.1)
SODIUM: 139 mmol/L (ref 135–145)

## 2018-08-19 MED ORDER — ONDANSETRON HCL 4 MG/2ML IJ SOLN
4.0000 mg | INTRAMUSCULAR | Status: DC | PRN
  Administered 2018-08-19 – 2018-08-23 (×4): 4 mg via INTRAVENOUS
  Filled 2018-08-19 (×4): qty 2

## 2018-08-19 MED ORDER — POTASSIUM CHLORIDE 2 MEQ/ML IV SOLN
INTRAVENOUS | Status: DC
  Filled 2018-08-19: qty 1000

## 2018-08-19 MED ORDER — POTASSIUM CHLORIDE 2 MEQ/ML IV SOLN: INTRAVENOUS | Status: DC

## 2018-08-19 MED ORDER — KCL IN DEXTROSE-NACL 20-5-0.45 MEQ/L-%-% IV SOLN
INTRAVENOUS | Status: DC
  Administered 2018-08-19 – 2018-08-24 (×7): via INTRAVENOUS
  Filled 2018-08-19 (×9): qty 1000

## 2018-08-19 MED ORDER — LIP MEDEX EX OINT
TOPICAL_OINTMENT | CUTANEOUS | Status: AC
  Administered 2018-08-19: 11:00:00
  Filled 2018-08-19: qty 7

## 2018-08-19 MED ORDER — DIATRIZOATE MEGLUMINE & SODIUM 66-10 % PO SOLN
90.0000 mL | Freq: Once | ORAL | Status: AC
  Administered 2018-08-19: 90 mL via NASOGASTRIC
  Filled 2018-08-19 (×2): qty 90

## 2018-08-19 NOTE — Progress Notes (Signed)
[facility] PROGRESS NOTE    Progress Note  Sherry [name]  MVH:[id number] DOB: 12-17-1923 DOA: 08/14/2018 PCP: Marletta Lor, MD     Brief Narrative:   Sherry [name] is an 84 y.o. female past medical history of colon cancer status post resection with a colostomy bag, bladder cancer, and left renal pelvis with recurrent stent exchange last one on 07/09/2018 by Dr. Alyson Ingles, she is due for stent exchange in 2 weeks, essential hypertension, coronary artery disease and COPD comes into the hospital with several days of nausea and diarrhea, and extreme confusion and anorexic.  Most of the history was obtained from the daughter as the patient cannot provide history.  Assessment/Plan:   Diarrhea: Mild leukocytosis resolved. Continue IV ciprofloxacin and Flagyl.  AKI: Likely pre-renal, resolved with IV hydration. Resume IV fluids keep her n.p.o.  Nausea and vomiting: New onset of vomiting, see below for further details.  Possible urinary tract infection: Culture data show E. coli sensitive to Cipro, will need to change Cipro to IV  Hypothyroidism Continue current medications.  Essential hypertension She is currently on no antihypertensive medication at home.  Elevated cardiac biomarkers: Levels have remained flat she denies any chest pain or shortness of breath KG showed nonspecific ST segment changes. The echo is pending.  Pressure injury of skin Turn patient every 2 hours.  Vascular dementia without aggressive behavior: Continue Exelon Use Seroquel at bedtime.  New nausea and vomiting likely due to ileus. Repeated KUB done this morning gas air filled loops in the small bowel. We will do a small bowel protocol, she has not vomited.   DVT prophylaxis: Lovenox Family Communication:daughter Disposition Plan/Barrier to D/C: Awaiting bed placement. Code Status:     Code Status Orders  (From admission, onward)         Start     Ordered   08/14/18 2033   Full code  Continuous     08/14/18 2034        Code Status History    Date Active Date Inactive Code Status Order ID Comments User Context   01/28/2017 1036 01/31/2017 1538 Full Code [id number]  Franchot Gallo, MD Inpatient    Advance Directive Documentation     Most Recent Value  Type of Advance Directive  Healthcare Power of [place]  Pre-existing out of facility DNR order (yellow form or pink MOST form)  -  "MOST" Form in Place?  -        IV Access:    Peripheral IV   Procedures and diagnostic studies:   Dg Abd 1 View  Result Date: 08/19/2018 CLINICAL DATA:  Follow-up ileus EXAM: ABDOMEN - 1 VIEW COMPARISON:  KUB of August 18, 2018 FINDINGS: There remain loops of moderately distended gas-filled small bowel in the left mid and lower abdomen and in the midline. On the right there is normal caliber colon containing considerable stool. There is gas in the stomach. No definite rectal gas is observed. There is a left double pigtail ureteral stent. There is dense calcification in the wall of the upper abdominal aorta. There are innumerable splenic calcifications. The bony structures exhibit no acute abnormality. IMPRESSION: Findings compatible with distal small bowel obstruction or generalized small bowel ileus. Slightly increased gaseous distention of small-bowel loops is observed today. No free extraluminal gas is observed. Electronically Signed   By: David  Martinique M.D.   On: 08/19/2018 07:08   Dg Abd 1 View  Result Date: 08/18/2018 CLINICAL DATA:  84 year old female with continued  nausea and recently resolved diarrhea EXAM: ABDOMEN - 1 VIEW COMPARISON:  CT scan of the abdomen and pelvis 08/14/2018 FINDINGS: Left double-J ureteral stent appears well positioned. Diffuse gaseous distention of multiple loops of small bowel throughout the abdomen is a new finding compared to the recent prior CT scan. Findings are concerning for bowel obstruction. Similar appearance of scattered punctate  calcifications throughout the spleen consistent with old granulomatous disease. Surgical clips are present in the left lower quadrant. Left lower quadrant ostomy. The bones are diffusely demineralized. Atherosclerotic calcifications are present in the abdominal aorta. No acute osseous abnormality. IMPRESSION: Interval development of numerous loops of gas-filled and dilated small bowel throughout the abdomen concerning for small bowel obstruction. Electronically Signed   By: Jacqulynn Cadet M.D.   On: 08/18/2018 10:01     Medical Consultants:    None.  Anti-Infectives:   Cipro and Flagyl  Subjective:    Sherry [name] further vomiting she relates she is nauseated has had minimal output through her stoma.  Objective:    Vitals:   08/18/18 0622 08/18/18 1740 08/18/18 2318 08/19/18 0430  BP:  (!) 155/103 (!) 149/91 (!) 149/85  Pulse:  93 89 90  Resp:   18 18  Temp:   98.5 F (36.9 C) 98.3 F (36.8 C)  TempSrc:   Oral Oral  SpO2:   96% 96%  Weight: 49.5 kg   48.9 kg  Height:        Intake/Output Summary (Last 24 hours) at 08/19/2018 0957 Last data filed at 08/19/2018 0600 Gross per 24 hour  Intake 2085.37 ml  Output -  Net 2085.37 ml   Filed Weights   08/17/18 0456 08/18/18 0622 08/19/18 0430  Weight: 48.1 kg 49.5 kg 48.9 kg    Exam: General exam: In no acute distress. Respiratory system: Good air movement and clear to auscultation. Cardiovascular system: S1 & S2 heard, RRR.  Gastrointestinal system: Abdomen is nondistended, soft and nontender, ostomy bag in place. Central nervous system: No focal neurological deficits. Extremities: No pedal edema. Skin: No rashes, lesions or ulcers   Data Reviewed:    Labs: Basic Metabolic Panel: Recent Labs  Lab 08/12/18 1635 08/14/18 1821 08/15/18 0701 08/19/18 0450  NA 136 138 139 139  K 4.9 4.3 4.3 3.5  CL 100 100 106 111  CO2 27 26 25  19*  GLUCOSE 140* 80 80 83  BUN 25* 33* 28* 11  CREATININE 0.91 1.12* 0.87  0.72  CALCIUM 10.1 10.2 9.3 8.7*   GFR Estimated Creatinine Clearance: 34.6 mL/min (by C-G formula based on SCr of 0.72 mg/dL). Liver Function Tests: Recent Labs  Lab 08/12/18 1635 08/14/18 1821 08/15/18 0701  AST 10 13* 12*  ALT 8 12 10   ALKPHOS 81 78 63  BILITOT 0.7 1.2 0.9  PROT 5.8* 5.9* 4.8*  ALBUMIN 3.3* 2.9* 2.4*   Recent Labs  Lab 08/14/18 1821  LIPASE 22   No results for input(s): AMMONIA in the last 168 hours. Coagulation profile No results for input(s): INR, PROTIME in the last 168 hours.  CBC: Recent Labs  Lab 08/12/18 1635 08/14/18 1821 08/15/18 0701 08/19/18 0450  WBC 11.9* 10.5 8.4 10.7*  NEUTROABS 10.3* 9.4*  --   --   HGB 12.3 12.9 11.6* 11.8*  HCT 37.7 40.1 36.2 36.7  MCV 91.8 93.0 93.5 91.8  PLT 268.0 239 195 207   Cardiac Enzymes: Recent Labs  Lab 08/14/18 1821 08/15/18 0043 08/15/18 0701 08/15/18 1315  TROPONINI 0.04* 0.03*  0.03* 0.03*   BNP (last 3 results) No results for input(s): PROBNP in the last 8760 hours. CBG: No results for input(s): GLUCAP in the last 168 hours. D-Dimer: No results for input(s): DDIMER in the last 72 hours. Hgb A1c: No results for input(s): HGBA1C in the last 72 hours. Lipid Profile: No results for input(s): CHOL, HDL, LDLCALC, TRIG, CHOLHDL, LDLDIRECT in the last 72 hours. Thyroid function studies: No results for input(s): TSH, T4TOTAL, T3FREE, THYROIDAB in the last 72 hours.  Invalid input(s): FREET3 Anemia work up: No results for input(s): VITAMINB12, FOLATE, FERRITIN, TIBC, IRON, RETICCTPCT in the last 72 hours. Sepsis Labs: Recent Labs  Lab 08/12/18 1635 08/14/18 1821 08/15/18 0701 08/19/18 0450  WBC 11.9* 10.5 8.4 10.7*  LATICACIDVEN  --  0.9  --   --    Microbiology Recent Results (from the past 240 hour(s))  Urine culture     Status: Abnormal   Collection Time: 08/14/18  7:44 PM  Result Value Ref Range Status   Specimen Description   Final    URINE, CLEAN CATCH Performed at  [facility], [place] [address], [place], [place] [address]    Special Requests   Final    NONE Performed at [facility], [place] [address] [place],  [address]    Culture (A)  Final    >=100,000 COLONIES/mL ESCHERICHIA COLI 80,000 COLONIES/mL ENTEROCOCCUS FAECALIS    Report Status 08/17/2018 FINAL  Final   Organism ID, Bacteria ESCHERICHIA COLI (A)  Final   Organism ID, Bacteria ENTEROCOCCUS FAECALIS (A)  Final      Susceptibility   Escherichia coli - MIC*    AMPICILLIN <=2 SENSITIVE Sensitive     CEFAZOLIN <=4 SENSITIVE Sensitive     CEFTRIAXONE 8 SENSITIVE Sensitive     CIPROFLOXACIN <=0.25 SENSITIVE Sensitive     GENTAMICIN <=1 SENSITIVE Sensitive     IMIPENEM 0.5 SENSITIVE Sensitive     NITROFURANTOIN <=16 SENSITIVE Sensitive     TRIMETH/SULFA <=20 SENSITIVE Sensitive     AMPICILLIN/SULBACTAM <=2 SENSITIVE Sensitive     PIP/TAZO <=4 SENSITIVE Sensitive     Extended ESBL NEGATIVE Sensitive     * >=100,000 COLONIES/mL ESCHERICHIA COLI   Enterococcus faecalis - MIC*    AMPICILLIN <=2 SENSITIVE Sensitive     LEVOFLOXACIN 0.5 SENSITIVE Sensitive     NITROFURANTOIN <=16 SENSITIVE Sensitive     VANCOMYCIN 1 SENSITIVE Sensitive     * 80,000 COLONIES/mL ENTEROCOCCUS FAECALIS  Gastrointestinal Panel by PCR , Stool     Status: Abnormal   Collection Time: 08/15/18  2:30 PM  Result Value Ref Range Status   Campylobacter species NOT DETECTED NOT DETECTED Final   Plesimonas shigelloides NOT DETECTED NOT DETECTED Final   Salmonella species NOT DETECTED NOT DETECTED Final   Yersinia enterocolitica NOT DETECTED NOT DETECTED Final   Vibrio species NOT DETECTED NOT DETECTED Final   Vibrio cholerae NOT DETECTED NOT DETECTED Final   Enteroaggregative E coli (EAEC) NOT DETECTED NOT DETECTED Final   Enteropathogenic E coli (EPEC) NOT DETECTED NOT DETECTED Final   Enterotoxigenic E coli (ETEC) DETECTED (A) NOT DETECTED Final    Comment: RESULT CALLED  TO, READ BACK BY AND VERIFIED WITH: ASHLEY BROWN 08/16/18 @ 51  Ivy like toxin producing E coli (STEC) NOT DETECTED NOT DETECTED Final   Shigella/Enteroinvasive E coli (EIEC) NOT DETECTED NOT DETECTED Final   Cryptosporidium NOT DETECTED NOT DETECTED Final   Cyclospora cayetanensis NOT DETECTED  NOT DETECTED Final   Entamoeba histolytica NOT DETECTED NOT DETECTED Final   Giardia lamblia NOT DETECTED NOT DETECTED Final   Adenovirus F40/41 NOT DETECTED NOT DETECTED Final   Astrovirus NOT DETECTED NOT DETECTED Final   Norovirus GI/GII NOT DETECTED NOT DETECTED Final   Rotavirus A NOT DETECTED NOT DETECTED Final   Sapovirus (I, II, IV, and V) NOT DETECTED NOT DETECTED Final    Comment: Performed at [facility], [address] [place], [place] [address]     Medications:   . darifenacin  7.5 mg Oral Daily  . diatrizoate meglumine-sodium  90 mL Per NG tube Once  . enoxaparin (LOVENOX) injection  30 mg Subcutaneous Q24H  . feeding supplement (ENSURE ENLIVE)  237 mL Oral BID BM  . levothyroxine  100 mcg Oral QAC breakfast  . metoprolol succinate  12.5 mg Oral QPM  . QUEtiapine  25 mg Oral QHS   Continuous Infusions: . sodium chloride 100 mL/hr at 08/19/18 0515  . ciprofloxacin 400 mg (08/18/18 1937)  . dextrose 5 % and 0.45% NaCl 1,000 mL with potassium chloride 20 mEq infusion    . metronidazole 500 mg (08/19/18 0514)     LOS: 4 days   [facility] Pager [phone]  *Please refer to [email or web address], password [email or web address] to get updated schedule on who will round on this patient, as hospitalists switch teams weekly. If 7PM-7AM, please contact night-coverage at [email or web address], password [email or web address] for any overnight needs.  08/19/2018, 9:57 AM

## 2018-08-19 NOTE — Progress Notes (Signed)
PT Cancellation Note  Patient Details Name: Sherry [name] MRN: [id number] DOB: 02/25/1923   Cancelled Treatment:    Reason Eval/Treat Not Completed: Patient declined, no reason specified;Fatigue/lethargy limiting ability to participate - Pt with reports of nausea, drinking gastric drink for imaging to rule out SBO per RN. Pt defers mobility until nausea passes, and RN agrees. PT to check back in with pt during acute stay. Will continue to follow.  Julien Girt, PT, DPT  Pager # [phone]     Destany Severns D Jewell Ryans 08/19/2018, 1:23 PM

## 2018-08-19 NOTE — Progress Notes (Signed)
Pt tolerated the gastric graph 60 ml .before  She started on the 3 rd bottle  I gave her 4 mg of Zofran. She vomited approx 30 ml . She stated she may not be able to fring the LAST 30ML , Dr Venetia Constable will be notified

## 2018-08-19 NOTE — Progress Notes (Signed)
Assumed care of this patient from previous RN. Patient is still refusing NG tube placement as well as finishing last bottle of Gastrografin due to nausea. Patient had one episode of vomiting. Will continue to monitor patient closely.

## 2018-08-20 ENCOUNTER — Inpatient Hospital Stay (HOSPITAL_COMMUNITY): Payer: Medicare Other

## 2018-08-20 DIAGNOSIS — E860 Dehydration: Secondary | ICD-10-CM

## 2018-08-20 LAB — BASIC METABOLIC PANEL
Anion gap: 5 (ref 5–15)
BUN: 10 mg/dL (ref 8–23)
CHLORIDE: 111 mmol/L (ref 98–111)
CO2: 24 mmol/L (ref 22–32)
CREATININE: 0.69 mg/dL (ref 0.44–1.00)
Calcium: 9.2 mg/dL (ref 8.9–10.3)
Glucose, Bld: 160 mg/dL — ABNORMAL HIGH (ref 70–99)
Potassium: 3.6 mmol/L (ref 3.5–5.1)
SODIUM: 140 mmol/L (ref 135–145)

## 2018-08-20 MED ORDER — HYDRALAZINE HCL 20 MG/ML IJ SOLN
10.0000 mg | Freq: Four times a day (QID) | INTRAMUSCULAR | Status: DC | PRN
  Administered 2018-08-20 – 2018-08-24 (×3): 10 mg via INTRAVENOUS
  Filled 2018-08-20 (×3): qty 1

## 2018-08-20 MED ORDER — LEVOTHYROXINE SODIUM 100 MCG IV SOLR
50.0000 ug | Freq: Every day | INTRAVENOUS | Status: DC
  Administered 2018-08-20 – 2018-08-25 (×6): 50 ug via INTRAVENOUS
  Filled 2018-08-20 (×6): qty 5

## 2018-08-20 NOTE — Progress Notes (Addendum)
PROGRESS NOTE    DEMIKA LANGENDERFER  GMW:[id number] DOB: 21-Nov-1923 DOA: 08/14/2018 PCP: Marletta Lor, MD   Brief Narrative:  HPI on 08/14/2018 by Dr. [name] Nyiah Pianka  is a 84 y.o. female, w hypertension, hyperlipidemia, CAD, hypothyroidism, Copd, [name], has c/o diarrhea (mild), for the past several days as well as nuasea.  Pt denies fever, chills, abd pain, emesis, constipation, brbpr, black stool, dysuria, hematuria.  Interim history Patient was admitted with diarrhea as well as acute kidney injury.  Now having nausea with what looks like a bowel obstruction versus ileus.  General surgery consulted and appreciated.  Assessment & Plan   ?SBO versus ileus with nausea -developed nausea and vomiting on 08/19/2018 -KUB was done showing gas air-filled loops in the small bowel -General surgery consulted and appreciated as patient has limited bowel sounds on exam -Abdominal x-ray showed persistent small bowel dilatation contrast has progressed into small bowel and right colon consistent with partial small bowel obstruction  Diarrhea  -improving -GI pathogen panel: Enterotoxigenic E. coli -Completed cipro and flagyl  Acute kidney injury -Resolved, creatinine currently 0.69  Urinary tract infection -UA showed cloudy appearance, large leukocytes, positive nitrites, few bacteria, >50 WBC -Urine culture: 80KEnterococcus faecalis, >100k E. Coli -Completed antibiotics  Hypothyroidism -Will change to IV Synthroid given the patient is not taking her oral medications  Elevated troponin -Has remained flat -Denies chest pain or shortness of breath -EKG showed nonspecific ST segment changes -Echocardiogram EF 55 to 60%, no regional wall motion abnormalities.  Findings consistent with left ventricular diastolic dysfunction, intermediate for quantification of dysfunction grade  Skin pressure injury -Continue wound care and turn patient every 2 hours  Vascular dementia -Continue  Exelon and Seroquel at bedtime  Essential hypertension -Currently on metoprolol -hydralazine ordered, it seems that patient's BP has been running high for several days and not addressed  Rectal cancer, stage III/tumor  DVT Prophylaxis  lovenox  Code Status: Full  Family Communication: none at bedside  Disposition Plan: Admitted, pending improvement in bowel obstruction  Consultants General surgery  Procedures  None  Antibiotics   Anti-infectives (From admission, onward)   Start     Dose/Rate Route Frequency Ordered Stop   08/18/18 2000  ciprofloxacin (CIPRO) IVPB 400 mg  Status:  Discontinued     400 mg 200 mL/hr over 60 Minutes Intravenous Every 12 hours 08/18/18 1113 08/19/18 1308   08/18/18 1400  metroNIDAZOLE (FLAGYL) IVPB 500 mg     500 mg 100 mL/hr over 60 Minutes Intravenous Every 8 hours 08/18/18 1113     08/17/18 1400  metroNIDAZOLE (FLAGYL) tablet 500 mg  Status:  Discontinued     500 mg Oral Every 8 hours 08/17/18 0908 08/18/18 1113   08/17/18 1000  ciprofloxacin (CIPRO) tablet 500 mg  Status:  Discontinued     500 mg Oral 2 times daily 08/17/18 0822 08/18/18 1113   08/17/18 0830  cephALEXin (KEFLEX) capsule 500 mg  Status:  Discontinued     500 mg Oral Every 8 hours 08/17/18 0821 08/17/18 0822   08/16/18 0000  ciprofloxacin (CIPRO) 500 MG tablet     500 mg Oral 2 times daily 08/16/18 0929 08/20/18 2359   08/15/18 2000  ciprofloxacin (CIPRO) IVPB 400 mg  Status:  Discontinued     400 mg 200 mL/hr over 60 Minutes Intravenous Every 24 hours 08/14/18 2239 08/17/18 0821   08/15/18 0600  metroNIDAZOLE (FLAGYL) IVPB 500 mg  Status:  Discontinued     500  mg 100 mL/hr over 60 Minutes Intravenous Every 8 hours 08/14/18 2211 08/17/18 0908   08/14/18 1945  ciprofloxacin (CIPRO) IVPB 400 mg     400 mg 200 mL/hr over 60 Minutes Intravenous  Once 08/14/18 1934 08/14/18 2105   08/14/18 1945  metroNIDAZOLE (FLAGYL) IVPB 500 mg     500 mg 100 mL/hr over 60 Minutes  Intravenous  Once 08/14/18 1934 08/14/18 2243      Subjective:   [name] seen and examined today.  Denies further nausea or vomiting today.  Denies current chest pain, shortness of breath, abdominal pain.  Feels her diarrhea has slowed down.  Denies dizziness or headache.  Objective:   Vitals:   08/19/18 2104 08/20/18 0500 08/20/18 0638 08/20/18 1324  BP: (!) 159/102  (!) 159/98 (!) 167/98  Pulse: 89  83 79  Resp: (!) 24  (!) 24 18  Temp: 98.9 F (37.2 C)  98.3 F (36.8 C) 98.3 F (36.8 C)  TempSrc: Oral  Oral Oral  SpO2: 95%  96% 94%  Weight:  51.3 kg    Height:        Intake/Output Summary (Last 24 hours) at 08/20/2018 1324 Last data filed at 08/20/2018 1029 Gross per 24 hour  Intake 1486.52 ml  Output 1300 ml  Net 186.52 ml   Filed Weights   08/18/18 0622 08/19/18 0430 08/20/18 0500  Weight: 49.5 kg 48.9 kg 51.3 kg    Exam  General: Well developed, well nourished, elderly, NAD, appears stated age  34: NCAT, mucous membranes moist.   Neck: Supple  Cardiovascular: S1 S2 auscultated, no rubs, murmurs or gallops. Regular rate and rhythm.  Respiratory: Clear to auscultation bilaterally with equal chest rise  Abdomen: Soft, nontender, mildly distended, hypoactive bowel sounds, ostomy with liquid stool  Extremities: warm dry without cyanosis clubbing or edema  Neuro: AAOx3, nonfocal  Skin: Without rashes exudates or nodules  Psych: Normal affect and demeanor with intact judgement and insight   Data Reviewed: I have personally reviewed following labs and imaging studies  CBC: Recent Labs  Lab 08/14/18 1821 08/15/18 0701 08/19/18 0450  WBC 10.5 8.4 10.7*  NEUTROABS 9.4*  --   --   HGB 12.9 11.6* 11.8*  HCT 40.1 36.2 36.7  MCV 93.0 93.5 91.8  PLT 239 195 267   Basic Metabolic Panel: Recent Labs  Lab 08/14/18 1821 08/15/18 0701 08/19/18 0450 08/20/18 0448  NA 138 139 139 140  K 4.3 4.3 3.5 3.6  CL 100 106 111 111  CO2 26 25 19* 24    GLUCOSE 80 80 83 160*  BUN 33* 28* 11 10  CREATININE 1.12* 0.87 0.72 0.69  CALCIUM 10.2 9.3 8.7* 9.2   GFR: Estimated Creatinine Clearance: 36.3 mL/min (by C-G formula based on SCr of 0.69 mg/dL). Liver Function Tests: Recent Labs  Lab 08/14/18 1821 08/15/18 0701  AST 13* 12*  ALT 12 10  ALKPHOS 78 63  BILITOT 1.2 0.9  PROT 5.9* 4.8*  ALBUMIN 2.9* 2.4*   Recent Labs  Lab 08/14/18 1821  LIPASE 22   No results for input(s): AMMONIA in the last 168 hours. Coagulation Profile: No results for input(s): INR, PROTIME in the last 168 hours. Cardiac Enzymes: Recent Labs  Lab 08/14/18 1821 08/15/18 0043 08/15/18 0701 08/15/18 1315  TROPONINI 0.04* 0.03* 0.03* 0.03*   BNP (last 3 results) No results for input(s): PROBNP in the last 8760 hours. HbA1C: No results for input(s): HGBA1C in the last 72 hours. CBG:  No results for input(s): GLUCAP in the last 168 hours. Lipid Profile: No results for input(s): CHOL, HDL, LDLCALC, TRIG, CHOLHDL, LDLDIRECT in the last 72 hours. Thyroid Function Tests: No results for input(s): TSH, T4TOTAL, FREET4, T3FREE, THYROIDAB in the last 72 hours. Anemia Panel: No results for input(s): VITAMINB12, FOLATE, FERRITIN, TIBC, IRON, RETICCTPCT in the last 72 hours. Urine analysis:    Component Value Date/Time   COLORURINE AMBER (A) 08/14/2018 1944   APPEARANCEUR CLOUDY (A) 08/14/2018 1944   LABSPEC 1.018 08/14/2018 1944   LABSPEC 1.030 12/06/2009 1628   PHURINE 5.0 08/14/2018 1944   GLUCOSEU NEGATIVE 08/14/2018 1944   HGBUR MODERATE (A) 08/14/2018 1944   BILIRUBINUR NEGATIVE 08/14/2018 1944   BILIRUBINUR 1+ 02/10/2016 1638   BILIRUBINUR Negative 12/06/2009 1628   KETONESUR 20 (A) 08/14/2018 1944   PROTEINUR 100 (A) 08/14/2018 1944   UROBILINOGEN 1.0 02/10/2016 1638   UROBILINOGEN 1.0 04/01/2010 1500   NITRITE POSITIVE (A) 08/14/2018 1944   LEUKOCYTESUR LARGE (A) 08/14/2018 1944   LEUKOCYTESUR Small 12/06/2009 1628   Sepsis  Labs: @LABRCNTIP (procalcitonin:4,lacticidven:4)  ) Recent Results (from the past 240 hour(s))  Urine culture     Status: Abnormal   Collection Time: 08/14/18  7:44 PM  Result Value Ref Range Status   Specimen Description   Final    URINE, CLEAN CATCH Performed at [facility], [place] [address] [place], [place] [address]    Special Requests   Final    NONE Performed at [facility], [place] [address], [place], [place] [address]    Culture (A)  Final    >=100,000 COLONIES/mL ESCHERICHIA COLI 80,000 COLONIES/mL ENTEROCOCCUS FAECALIS    Report Status 08/17/2018 FINAL  Final   Organism ID, Bacteria ESCHERICHIA COLI (A)  Final   Organism ID, Bacteria ENTEROCOCCUS FAECALIS (A)  Final      Susceptibility   Escherichia coli - MIC*    AMPICILLIN <=2 SENSITIVE Sensitive     CEFAZOLIN <=4 SENSITIVE Sensitive     CEFTRIAXONE 8 SENSITIVE Sensitive     CIPROFLOXACIN <=0.25 SENSITIVE Sensitive     GENTAMICIN <=1 SENSITIVE Sensitive     IMIPENEM 0.5 SENSITIVE Sensitive     NITROFURANTOIN <=16 SENSITIVE Sensitive     TRIMETH/SULFA <=20 SENSITIVE Sensitive     AMPICILLIN/SULBACTAM <=2 SENSITIVE Sensitive     PIP/TAZO <=4 SENSITIVE Sensitive     Extended ESBL NEGATIVE Sensitive     * >=100,000 COLONIES/mL ESCHERICHIA COLI   Enterococcus faecalis - MIC*    AMPICILLIN <=2 SENSITIVE Sensitive     LEVOFLOXACIN 0.5 SENSITIVE Sensitive     NITROFURANTOIN <=16 SENSITIVE Sensitive     VANCOMYCIN 1 SENSITIVE Sensitive     * 80,000 COLONIES/mL ENTEROCOCCUS FAECALIS  Gastrointestinal Panel by PCR , Stool     Status: Abnormal   Collection Time: 08/15/18  2:30 PM  Result Value Ref Range Status   Campylobacter species NOT DETECTED NOT DETECTED Final   Plesimonas shigelloides NOT DETECTED NOT DETECTED Final   Salmonella species NOT DETECTED NOT DETECTED Final   Yersinia enterocolitica NOT DETECTED NOT DETECTED Final   Vibrio species NOT DETECTED NOT DETECTED Final    Vibrio cholerae NOT DETECTED NOT DETECTED Final   Enteroaggregative E coli (EAEC) NOT DETECTED NOT DETECTED Final   Enteropathogenic E coli (EPEC) NOT DETECTED NOT DETECTED Final   Enterotoxigenic E coli (ETEC) DETECTED (A) NOT DETECTED Final    Comment: RESULT CALLED TO, READ BACK BY AND VERIFIED WITH: ASHLEY BROWN 08/16/18 @  [place] like toxin producing E coli (STEC) NOT DETECTED NOT DETECTED Final   Shigella/Enteroinvasive E coli (EIEC) NOT DETECTED NOT DETECTED Final   Cryptosporidium NOT DETECTED NOT DETECTED Final   Cyclospora cayetanensis NOT DETECTED NOT DETECTED Final   Entamoeba histolytica NOT DETECTED NOT DETECTED Final   Giardia lamblia NOT DETECTED NOT DETECTED Final   Adenovirus F40/41 NOT DETECTED NOT DETECTED Final   Astrovirus NOT DETECTED NOT DETECTED Final   Norovirus GI/GII NOT DETECTED NOT DETECTED Final   Rotavirus A NOT DETECTED NOT DETECTED Final   Sapovirus (I, II, IV, and V) NOT DETECTED NOT DETECTED Final    Comment: Performed at [facility], [address], [place], [place] [address]      Radiology Studies: Dg Abd 1 View  Result Date: 08/20/2018 CLINICAL DATA:  See bowel obstruction, follow-up EXAM: ABDOMEN - 1 VIEW COMPARISON:  08/19/2018 FINDINGS: Contrast is seen within a few dilated small bowel loops and within the RIGHT colon. Persistent dilatation of small bowel loops consistent with partial small bowel obstruction. No definite bowel wall thickening. Gaseous distention of stomach. LEFT ureteral stent again seen. Scattered atherosclerotic calcifications. Bones demineralized. IMPRESSION: Persistent small bowel dilatation though contrast has progressed into the distal small bowel and RIGHT colon consistent with partial small bowel obstruction. Electronically Signed   By: Lavonia Dana M.D.   On: 08/20/2018 13:18   Dg Abd 1 View  Result Date: 08/19/2018 CLINICAL DATA:  Follow-up ileus EXAM: ABDOMEN - 1 VIEW COMPARISON:  KUB of August 18, 2018 FINDINGS: There remain loops of moderately distended gas-filled small bowel in the left mid and lower abdomen and in the midline. On the right there is normal caliber colon containing considerable stool. There is gas in the stomach. No definite rectal gas is observed. There is a left double pigtail ureteral stent. There is dense calcification in the wall of the upper abdominal aorta. There are innumerable splenic calcifications. The bony structures exhibit no acute abnormality. IMPRESSION: Findings compatible with distal small bowel obstruction or generalized small bowel ileus. Slightly increased gaseous distention of small-bowel loops is observed today. No free extraluminal gas is observed. Electronically Signed   By: David  Martinique M.D.   On: 08/19/2018 07:08   Dg Abd Portable 1v-small Bowel Obstruction Protocol-initial, 8 Hr Delay  Result Date: 08/19/2018 CLINICAL DATA:  Small bowel protocol 8 hour delay EXAM: PORTABLE ABDOMEN - 1 VIEW COMPARISON:  08/19/2018, 08/18/2018, CT 08/14/2018 FINDINGS: Left-sided ureteral stent. Vascular calcifications. Multiple loops of dilated small bowel measuring up to 4 cm. Faint contrast within dilated small bowel loops. Large amount of stool in the right colon. Multiple splenic calcifications/granuloma. IMPRESSION: 1. Faintly visible contrast material within dilated small bowel loops. The slightly dense appearance of right abdominal stools is similar as compared with CT 08/14/2018. Electronically Signed   By: Donavan Foil M.D.   On: 08/19/2018 21:54     Scheduled Meds: . darifenacin  7.5 mg Oral Daily  . enoxaparin (LOVENOX) injection  30 mg Subcutaneous Q24H  . feeding supplement (ENSURE ENLIVE)  237 mL Oral BID BM  . levothyroxine  50 mcg Intravenous Daily  . metoprolol succinate  12.5 mg Oral QPM  . QUEtiapine  25 mg Oral QHS   Continuous Infusions: . sodium chloride 100 mL/hr at 08/19/18 0515  . dextrose 5 % and 0.45 % NaCl with KCl 20 mEq/L 75 mL/hr at  08/20/18 0513  . metronidazole 500 mg (08/20/18 0515)  LOS: 5 days   Time Spent in minutes   30 minutes  Richa Shor D.O. on 08/20/2018 at 1:24 PM  Between 7am to 7pm - Please see pager noted on [email or web address]  After 7pm go to [email or web address]  And look for the night coverage person covering for me after hours  [facility] Office  [phone]

## 2018-08-20 NOTE — Progress Notes (Signed)
Patient continues to complain of nausea after minimal ice chips. Zofran given per prn orders

## 2018-08-20 NOTE — Progress Notes (Signed)
PT Cancellation Note  Patient Details Name: Sherry [name] MRN: [id number] DOB: 09-03-1923   Cancelled Treatment:    Reason Eval/Treat Not Completed: Patient declined, no reason specified;Fatigue/lethargy limiting ability to participate - Pt with nausea and fatigue today. Pt's family at bedside, and family and previous notes state potential of SBO, testing to follow today. Will continue to follow acutely and progress mobility as able.   Sherry [name], PT, DPT  Pager # [phone]     Sherry [name] 08/20/2018, 2:24 PM

## 2018-08-20 NOTE — Progress Notes (Signed)
BP 167/98, no PRN ordered. BP trends high for past several days. MD Mikhail made aware and will assess.

## 2018-08-20 NOTE — Progress Notes (Signed)
Patient c/o feeling slightly nauseated with ice chips

## 2018-08-20 NOTE — Consult Note (Addendum)
Reason for Consult: Ileus vs obstruction Referring Physician: Dr. Curly Shores CC: Emesis/Diarrhea >> SBO versus ileus  Sherry [name] is an 84 y.o. female.   HPI: Patient is 84 year old female admitted on 08/14/2018 with chief complaint of emesis and diarrhea.  She has a history rectal cancer T2N1 stage III.  She underwent APR in 2011 and is undergone chemotherapy.  She has a history of a parastomal hernia repaired laparoscopically, November 2013.  She is also had a cholecystectomy, appendectomy, ureteral stent placements, multiple times in 2018, bladder tumor resection 01/2018.  Work-up since admission includes a CT of the abdomen showing interval development of mucosal thickening of the pericolonic inflammatory changes in the lung segment of the colon upstream to the left lower quadrant colostomy, a moderate stool burden.,  Left double-J ureteral stents, and a 5.1 cm abdominal aortic aneurysm.  Urine culture was positive for E. coli.  GI panel was positive for enterotoxic E. coli.  She has been placed on Cipro and Flagyl to cover these bacteria.  ]  She developed new nausea and vomiting on 08/18/2018.  Abdominal films showed interval development of numerous loops of gas-filled and dilated small bowel throughout the abdomen concerning for an obstruction versus a generalized ileus.  There is no free or extraluminal gas noted yesterday.  Dr. Olevia Bowens attempted a small bowel protocol without the NG yesterday.  Patient refused NG placement & decompression. She did not complete gastrografin for an attempted small bowel protocol evaluation yesterday. It Sounds like she got a portion of the Gastrografin down but refused to complete it due to nausea.  Patient has one episode of emesis noted last evening.  Colostomy outputs not recorded.    She is afebrile and somewhat hypertensive.  Heart rate is in the 83-93 range.  Labs: Glucose is 160 CMP is otherwise normal, potassium 3.6.  WBC yesterday is 10.7, hemoglobin 11.8,  hematocrit 37.6, platelets 207,000.  She had 3 troponins on 8/30 all were 0.03.  GI panel and urine culture as noted above.  We are asked to see.  She is a full code at this time.  Note from yesterday said she had nausea while drinking the Gastrografin and did not drink the remaining Gastrografin.  Another account suggest she had nausea and vomiting with the Gastrografin.  Patient cannot remember, and family members were not present.   Past Medical History:  Diagnosis Date  . AAA (abdominal aortic aneurysm) ([place])   . Anemia   . Arthritis   . Bronchitis   . CAD (coronary artery disease)   . COPD (chronic obstructive pulmonary disease) ([place])   . Emphysema of lung ([place])   . GERD (gastroesophageal reflux disease)   . Headache(784.0)   . History of blood transfusion   . History of kidney stones   . Hyperlipidemia   . Hypertension   . Hypothyroidism   . Osteoporosis   . Rectal adenocarcinoma ([place]) dx'd 08/2009  . Rectal cancer ([place])   . Scoliosis    per Dr Melony Overly ortho  . Shingles   . Shortness of breath   . Tendon adhesions    torn tendon right shoulder    Past Surgical History:  Procedure Laterality Date  . APPENDECTOMY    . BALLOON DILATION Left 04/10/2017   Procedure: BALLOON DILATION OF LEFT URETERAL STRICTURE;  Surgeon: Cleon Gustin, MD;  Location: [facility];  Service: Urology;  Laterality: Left;  . CARDIAC CATHETERIZATION    . CHOLECYSTECTOMY    . COLON  REMOVAL     8 " 12/2009 DR TSUEI  . COLON SURGERY     apr for rectal cancer 2011  . COLONOSCOPY    . COLOSTOMY    . CYSTOSCOPY W/ URETERAL STENT PLACEMENT Left 03/18/2017   Procedure: CYSTOSCOPY WITH RETROGRADE PYELOGRAM/URETERAL STENT PLACEMENT AND URETERAL DIAGNOSTIC LEFT;  Surgeon: Cleon Gustin, MD;  Location: [facility];  Service: Urology;  Laterality: Left;  1 HR [phone] [id number] A [phone] - pt to arrive at 7:00 to receive blood  . CYSTOSCOPY W/ URETERAL STENT PLACEMENT Left 04/10/2017    Procedure: CYSTOSCOPY WITH STENT REPLACEMENT;  Surgeon: Cleon Gustin, MD;  Location: [facility];  Service: Urology;  Laterality: Left;  . CYSTOSCOPY W/ URETERAL STENT PLACEMENT Left 06/03/2017   Procedure: CYSTOSCOPY WITH LEFT RETROGRADE PYELOGRAM/LEFT URETERAL  BALLOON DILATION AND LEFT URETERAL STENT PLACEMENT;  Surgeon: Cleon Gustin, MD;  Location: [facility];  Service: Urology;  Laterality: Left;  . CYSTOSCOPY W/ URETERAL STENT PLACEMENT Left 09/16/2017   Procedure: CYSTOSCOPY WITH LEFT STENT EXCHANGE/ RETROGRADE;  Surgeon: Cleon Gustin, MD;  Location: [facility];  Service: Urology;  Laterality: Left;  NEEDS TOTAL 30 MIN  . CYSTOSCOPY W/ URETERAL STENT PLACEMENT Left 02/12/2018   Procedure: CYSTOSCOPY WITH RETROGRADE PYELOGRAM/URETERAL STENT EXCHANGE/HOLMIUM LASER;  Surgeon: Cleon Gustin, MD;  Location: [facility];  Service: Urology;  Laterality: Left;  . CYSTOSCOPY W/ URETERAL STENT PLACEMENT Left 04/30/2018   Procedure: CYSTOSCOPY WITH LEFT RETROGRADE PYELOGRAM/LEFT URETERAL STENT EXCHANGE;  Surgeon: Cleon Gustin, MD;  Location: [facility];  Service: Urology;  Laterality: Left;  . CYSTOSCOPY W/ URETERAL STENT PLACEMENT Left 07/09/2018   Procedure: CYSTOSCOPY WITH LEFT RETROGRADE PYELOGRAM/LEFT URETERAL STENT EXCHANGE;  Surgeon: Cleon Gustin, MD;  Location: [facility];  Service: Urology;  Laterality: Left;  . CYSTOSCOPY WITH HOLMIUM LASER LITHOTRIPSY  07/09/2018   Procedure: CYSTOSCOPY WITH HOLMIUM LASER LITHOTRIPSY BLADDER CALCULUS;  Surgeon: Cleon Gustin, MD;  Location: [facility];  Service: Urology;;  . Consuela Mimes WITH RETROGRADE PYELOGRAM, URETEROSCOPY AND STENT PLACEMENT Left 03/29/2016   Procedure: CYSTOSCOPY WITH RETROGRADE PYELOGRAM, URETEROSCOPY AND STENT PLACEMENT;  Surgeon: Kathie Rhodes, MD;  Location: [facility];  Service: Urology;  Laterality: Left;  With Laser  . CYSTOSCOPY/RETROGRADE/URETEROSCOPY Left 01/28/2017   Procedure: CYSTOSCOPY/RETROGRADE/URETEROSCOPY (FLEX 6  fr) THULIUM LASER OF TUMOR;  Surgeon: Franchot Gallo, MD;  Location: [facility];  Service: Urology;  Laterality: Left;  . CYSTOSCOPY/RETROGRADE/URETEROSCOPY Left 04/10/2017   Procedure: CYSTOSCOPY, LEFT RETROGRADE PYELOGRAM LEFT URETEROSCOPY;  Surgeon: Cleon Gustin, MD;  Location: [facility];  Service: Urology;  Laterality: Left;  . EYE SURGERY     Cataract with lens- bil  . HOLMIUM LASER APPLICATION Left 9/41/7408   Procedure: HOLMIUM LASER ABLATION LEFT URETERAL TUMOR;  Surgeon: Cleon Gustin, MD;  Location: [facility];  Service: Urology;  Laterality: Left;  . HOLMIUM LASER APPLICATION Left 1/44/8185   Procedure: HOLMIUM LASER LITHOTRIPSY BLADDER CALCULUS (LEFT ENCRUSTED URETERAL STENT);  Surgeon: Cleon Gustin, MD;  Location: [facility];  Service: Urology;  Laterality: Left;  . OOPHORECTOMY    . ovary tumor removal     after son was born  . PARASTOMAL HERNIA REPAIR  10/30/2012  . POLYPECTOMY    . RECTUM REMOVAL     1/11 DR TSUEI  . TRANSURETHRAL RESECTION OF BLADDER TUMOR N/A 10/21/2017   Procedure: TRANSURETHRAL RESECTION OF BLADDER TUMOR (TURBT);  Surgeon: Cleon Gustin, MD;  Location: [facility];  Service: Urology;  Laterality: N/A;  . TRANSURETHRAL RESECTION  OF BLADDER TUMOR N/A 02/12/2018   Procedure: TRANSURETHRAL RESECTION OF BLADDER TUMOR (TURBT);  Surgeon: Cleon Gustin, MD;  Location: [facility];  Service: Urology;  Laterality: N/A;  . TUBAL LIGATION    . VAGINAL HYSTERECTOMY    . VENTRAL HERNIA REPAIR  10/30/2012   Procedure: LAPAROSCOPIC VENTRAL HERNIA;  Surgeon: Imogene Burn. Georgette Dover, MD;  Location: [facility];  Service: General;  Laterality: N/A;  Laparoscopic repair of parastomal hernia    Family History  Problem Relation Age of Onset  . Heart failure Sister   . Cervical cancer Sister   . Heart failure Brother   . Colon cancer Brother   . Colon cancer Brother   . Heart failure Father   . Cancer Mother        HEAD AND NECK  . Cervical cancer Daughter   . Kidney cancer  Daughter     Social History:  reports that she quit smoking about 14 years ago. Her smoking use included cigarettes. She has a 97.50 pack-year smoking history. She has never used smokeless tobacco. She reports that she does not drink alcohol or use drugs.  Allergies:  Allergies  Allergen Reactions  . Percocet [Oxycodone-Acetaminophen] Nausea And Vomiting  . Influenza Virus Vacc Split Pf Other (See Comments)    Unknown  But had to be hospitalized -1966  . Sulfamethoxazole-Trimethoprim Nausea And Vomiting  . Hibiclens [Chlorhexidine Gluconate] Rash    Medications:  Prior to Admission:  Medications Prior to Admission  Medication Sig Dispense Refill Last Dose  . acetaminophen (TYLENOL) 650 MG CR tablet Take 1,300 mg by mouth daily as needed for pain.   Past Month at Unknown time  . atorvastatin (LIPITOR) 10 MG tablet TAKE 1 TABLET DAILY 90 tablet 2 08/13/2018 at Unknown time  . diphenoxylate-atropine (LOMOTIL) 2.5-0.025 MG tablet Take 1 tablet by mouth 4 (four) times daily as needed for diarrhea or loose stools. 30 tablet 0 08/14/2018 at Unknown time  . levothyroxine (SYNTHROID, LEVOTHROID) 100 MCG tablet Take 100 mcg by mouth daily before breakfast.   08/14/2018 at Unknown time  . ondansetron (ZOFRAN) 4 MG tablet Take 1 tablet (4 mg total) by mouth every 8 (eight) hours as needed for nausea or vomiting. 30 tablet 0 08/14/2018 at Unknown time  . PROAIR HFA 108 (90 Base) MCG/ACT inhaler USE 2 INHALATIONS INTO THE LUNGS EVERY 4 HOURS AS NEEDED FOR SHORTNESS OF BREATH (Patient taking differently: Inhale 2 puffs into the lungs every 4 (four) hours as needed for wheezing or shortness of breath. ) 25.5 g 5 Past Month at Unknown time  . TOPROL XL 25 MG 24 hr tablet TAKE ONE-HALF (1/2) TABLET DAILY (Patient taking differently: Take 12.5 mg by mouth every evening. ) 90 tablet 1 08/13/2018 at 1800  . Trospium Chloride 60 MG CP24 Take 1 capsule by mouth daily.   08/14/2018 at Unknown time  . [DISCONTINUED]  traMADol-acetaminophen (ULTRACET) 37.5-325 MG tablet Take 1 tablet by mouth every 6 (six) hours as needed for moderate pain. 15 tablet 0 Past Month at Unknown time  . levothyroxine (SYNTHROID, LEVOTHROID) 75 MCG tablet Take 1 tablet (75 mcg total) by mouth daily. (Patient not taking: Reported on 08/14/2018) 90 tablet 2 Not Taking at Unknown time   Scheduled: . darifenacin  7.5 mg Oral Daily  . enoxaparin (LOVENOX) injection  30 mg Subcutaneous Q24H  . feeding supplement (ENSURE ENLIVE)  237 mL Oral BID BM  . levothyroxine  100 mcg Oral QAC breakfast  . metoprolol succinate  12.5 mg Oral QPM  . QUEtiapine  25 mg Oral QHS   Continuous: . sodium chloride 100 mL/hr at 08/19/18 0515  . dextrose 5 % and 0.45 % NaCl with KCl 20 mEq/L 75 mL/hr at 08/20/18 0513  . metronidazole 500 mg (08/20/18 0515)  60 ml PO recorded yesterday A portion of the gastrografin was taken yesterday and she vomited it up.   Anti-infectives (From admission, onward)   Start     Dose/Rate Route Frequency Ordered Stop   08/18/18 2000  ciprofloxacin (CIPRO) IVPB 400 mg  Status:  Discontinued     400 mg 200 mL/hr over 60 Minutes Intravenous Every 12 hours 08/18/18 1113 08/19/18 1308   08/18/18 1400  metroNIDAZOLE (FLAGYL) IVPB 500 mg     500 mg 100 mL/hr over 60 Minutes Intravenous Every 8 hours 08/18/18 1113     08/17/18 1400  metroNIDAZOLE (FLAGYL) tablet 500 mg  Status:  Discontinued     500 mg Oral Every 8 hours 08/17/18 0908 08/18/18 1113   08/17/18 1000  ciprofloxacin (CIPRO) tablet 500 mg  Status:  Discontinued     500 mg Oral 2 times daily 08/17/18 0822 08/18/18 1113   08/17/18 0830  cephALEXin (KEFLEX) capsule 500 mg  Status:  Discontinued     500 mg Oral Every 8 hours 08/17/18 0821 08/17/18 0822   08/16/18 0000  ciprofloxacin (CIPRO) 500 MG tablet     500 mg Oral 2 times daily 08/16/18 0929 08/20/18 2359   08/15/18 2000  ciprofloxacin (CIPRO) IVPB 400 mg  Status:  Discontinued     400 mg 200 mL/hr over 60  Minutes Intravenous Every 24 hours 08/14/18 2239 08/17/18 0821   08/15/18 0600  metroNIDAZOLE (FLAGYL) IVPB 500 mg  Status:  Discontinued     500 mg 100 mL/hr over 60 Minutes Intravenous Every 8 hours 08/14/18 2211 08/17/18 0908   08/14/18 1945  ciprofloxacin (CIPRO) IVPB 400 mg     400 mg 200 mL/hr over 60 Minutes Intravenous  Once 08/14/18 1934 08/14/18 2105   08/14/18 1945  metroNIDAZOLE (FLAGYL) IVPB 500 mg     500 mg 100 mL/hr over 60 Minutes Intravenous  Once 08/14/18 1934 08/14/18 2243      Results for orders placed or performed during the hospital encounter of 08/14/18 (from the past 48 hour(s))  Basic metabolic panel     Status: Abnormal   Collection Time: 08/19/18  4:50 AM  Result Value Ref Range   Sodium 139 135 - 145 mmol/L   Potassium 3.5 3.5 - 5.1 mmol/L   Chloride 111 98 - 111 mmol/L   CO2 19 (L) 22 - 32 mmol/L   Glucose, Bld 83 70 - 99 mg/dL   BUN 11 8 - 23 mg/dL   Creatinine, Ser 0.72 0.44 - 1.00 mg/dL   Calcium 8.7 (L) 8.9 - 10.3 mg/dL   GFR calc non Af Amer >60 >60 mL/min   GFR calc Af Amer >60 >60 mL/min    Comment: (NOTE) The eGFR has been calculated using the CKD EPI equation. This calculation has not been validated in all clinical situations. eGFR's persistently <60 mL/min signify possible Chronic Kidney Disease.    Anion gap 9 5 - 15    Comment: Performed at [facility], [place] [address] [place], [place] [address]  CBC     Status: Abnormal   Collection Time: 08/19/18  4:50 AM  Result Value Ref Range   WBC 10.7 (H) 4.0 - 10.5 K/uL  RBC 4.00 3.87 - 5.11 MIL/uL   Hemoglobin 11.8 (L) 12.0 - 15.0 g/dL   HCT 36.7 36.0 - 46.0 %   MCV 91.8 78.0 - 100.0 fL   MCH 29.5 26.0 - 34.0 pg   MCHC 32.2 30.0 - 36.0 g/dL   RDW 15.6 (H) 11.5 - 15.5 %   Platelets 207 150 - 400 K/uL    Comment: Performed at [facility], [place] [address] [place], [place] [address]  Basic metabolic panel     Status: Abnormal   Collection Time:  08/20/18  4:48 AM  Result Value Ref Range   Sodium 140 135 - 145 mmol/L   Potassium 3.6 3.5 - 5.1 mmol/L   Chloride 111 98 - 111 mmol/L   CO2 24 22 - 32 mmol/L   Glucose, Bld 160 (H) 70 - 99 mg/dL   BUN 10 8 - 23 mg/dL   Creatinine, Ser 0.69 0.44 - 1.00 mg/dL   Calcium 9.2 8.9 - 10.3 mg/dL   GFR calc non Af Amer >60 >60 mL/min   GFR calc Af Amer >60 >60 mL/min    Comment: (NOTE) The eGFR has been calculated using the CKD EPI equation. This calculation has not been validated in all clinical situations. eGFR's persistently <60 mL/min signify possible Chronic Kidney Disease.    Anion gap 5 5 - 15    Comment: Performed at [facility], [place] [address] [place], [place] [address]    Dg Abd 1 View  Result Date: 08/19/2018 CLINICAL DATA:  Follow-up ileus EXAM: ABDOMEN - 1 VIEW COMPARISON:  KUB of August 18, 2018 FINDINGS: There remain loops of moderately distended gas-filled small bowel in the left mid and lower abdomen and in the midline. On the right there is normal caliber colon containing considerable stool. There is gas in the stomach. No definite rectal gas is observed. There is a left double pigtail ureteral stent. There is dense calcification in the wall of the upper abdominal aorta. There are innumerable splenic calcifications. The bony structures exhibit no acute abnormality. IMPRESSION: Findings compatible with distal small bowel obstruction or generalized small bowel ileus. Slightly increased gaseous distention of small-bowel loops is observed today. No free extraluminal gas is observed. Electronically Signed   By: Teana Lindahl  [place] M.D.   On: 08/19/2018 07:08   Dg Abd 1 View  Result Date: 08/18/2018 CLINICAL DATA:  84 year old female with continued nausea and recently resolved diarrhea EXAM: ABDOMEN - 1 VIEW COMPARISON:  CT scan of the abdomen and pelvis 08/14/2018 FINDINGS: Left double-J ureteral stent appears well positioned. Diffuse gaseous distention of multiple  loops of small bowel throughout the abdomen is a new finding compared to the recent prior CT scan. Findings are concerning for bowel obstruction. Similar appearance of scattered punctate calcifications throughout the spleen consistent with old granulomatous disease. Surgical clips are present in the left lower quadrant. Left lower quadrant ostomy. The bones are diffusely demineralized. Atherosclerotic calcifications are present in the abdominal aorta. No acute osseous abnormality. IMPRESSION: Interval development of numerous loops of gas-filled and dilated small bowel throughout the abdomen concerning for small bowel obstruction. Electronically Signed   By: Jacqulynn Cadet M.D.   On: 08/18/2018 10:01   Dg Abd Portable 1v-small Bowel Obstruction Protocol-initial, 8 Hr Delay  Result Date: 08/19/2018 CLINICAL DATA:  Small bowel protocol 8 hour delay EXAM: PORTABLE ABDOMEN - 1 VIEW COMPARISON:  08/19/2018, 08/18/2018, CT 08/14/2018 FINDINGS: Left-sided ureteral stent. Vascular calcifications. Multiple loops of dilated small bowel  measuring up to 4 cm. Faint contrast within dilated small bowel loops. Large amount of stool in the right colon. Multiple splenic calcifications/granuloma. IMPRESSION: 1. Faintly visible contrast material within dilated small bowel loops. The slightly dense appearance of right abdominal stools is similar as compared with CT 08/14/2018. Electronically Signed   By: Donavan Foil M.D.   On: 08/19/2018 21:54    Review of Systems  Unable to perform ROS: Other  Constitutional:       Family notes she is been pretty much bedridden since her last hospitalization and stent placement.  She is too tired to get up and get around the house.  She lives with her husband and adult children.  Patient's memory is diminished most of the questions she tells me she does not really remember. I asked her about constipation she said she had it before her colostomy Blood pressure (!) 159/98, pulse 83,  temperature 98.3 F (36.8 C), temperature source Oral, resp. rate (!) 24, height '5\' 3"'  (1.6 m), weight 51.3 kg, SpO2 96 %. Physical Exam  Constitutional: She is oriented to person, place, and time. No distress.  Elderly 84 year old female in bed, in no acute distress.  She appears comfortable and is not complaining of any discomfort currently.  HENT:  Head: Normocephalic and atraumatic.  Mouth/Throat: Oropharynx is clear and moist. No oropharyngeal exudate.  Eyes: Right eye exhibits no discharge. Left eye exhibits no discharge. No scleral icterus.  Pupils are equal  Neck: Neck supple. No JVD present. No tracheal deviation present. No thyromegaly present.  Cardiovascular: Normal rate, regular rhythm and normal heart sounds.  No murmur heard. Respiratory: Effort normal and breath sounds normal. No respiratory distress. She has no wheezes. She has no rales. She exhibits no tenderness.  GI: Soft. She exhibits distension (Mild). She exhibits no mass. There is no tenderness. There is no rebound and no guarding.  Bowel sounds are hypoactive. Ostomy is in place and liquid stool/flatus is in the bag. Midline surgical scar is well-healed.  Musculoskeletal: She exhibits no edema or tenderness.  Lymphadenopathy:    She has no cervical adenopathy.  Neurological: She is alert and oriented to person, place, and time. No cranial nerve deficit.  Skin: Skin is warm and dry. No rash noted. She is not diaphoretic. No erythema. No pallor.  Psychiatric: She has a normal mood and affect. Her behavior is normal. Judgment and thought content normal.   Anti-infectives (From admission, onward)   Start     Dose/Rate Route Frequency Ordered Stop   08/18/18 2000  ciprofloxacin (CIPRO) IVPB 400 mg  Status:  Discontinued     400 mg 200 mL/hr over 60 Minutes Intravenous Every 12 hours 08/18/18 1113 08/19/18 1308   08/18/18 1400  metroNIDAZOLE (FLAGYL) IVPB 500 mg     500 mg 100 mL/hr over 60 Minutes Intravenous Every  8 hours 08/18/18 1113     08/17/18 1400  metroNIDAZOLE (FLAGYL) tablet 500 mg  Status:  Discontinued     500 mg Oral Every 8 hours 08/17/18 0908 08/18/18 1113   08/17/18 1000  ciprofloxacin (CIPRO) tablet 500 mg  Status:  Discontinued     500 mg Oral 2 times daily 08/17/18 0822 08/18/18 1113   08/17/18 0830  cephALEXin (KEFLEX) capsule 500 mg  Status:  Discontinued     500 mg Oral Every 8 hours 08/17/18 0821 08/17/18 0822   08/16/18 0000  ciprofloxacin (CIPRO) 500 MG tablet     500 mg Oral 2 times daily  08/16/18 0929 08/20/18 2359   08/15/18 2000  ciprofloxacin (CIPRO) IVPB 400 mg  Status:  Discontinued     400 mg 200 mL/hr over 60 Minutes Intravenous Every 24 hours 08/14/18 2239 08/17/18 0821   08/15/18 0600  metroNIDAZOLE (FLAGYL) IVPB 500 mg  Status:  Discontinued     500 mg 100 mL/hr over 60 Minutes Intravenous Every 8 hours 08/14/18 2211 08/17/18 0908   08/14/18 1945  ciprofloxacin (CIPRO) IVPB 400 mg     400 mg 200 mL/hr over 60 Minutes Intravenous  Once 08/14/18 1934 08/14/18 2105   08/14/18 1945  metroNIDAZOLE (FLAGYL) IVPB 500 mg     500 mg 100 mL/hr over 60 Minutes Intravenous  Once 08/14/18 1934 08/14/18 2243     Assessment/Plan: T2N1 stage III rectal cancer -APR 2011/chemotherapy Parastomal hernia repair 2011 status post appendectomy, cholecystectomy, multiple ureteral stent placements, bladder tumor resection 01/2018 AKI -resolving Elevated troponins -  Hypertension Hypothyroid History of vascular dementia -on Exelon/Seroquel Hx of tobacco use AAA 5.1 cm  Nausea, vomiting and diarrhea SBO versus ileus E. coli UTI/enterotoxic E. Coli - positive GI panel  FEN: IV fluids/n.p.o  ID: Cipro 8/29 - 08/19/18; Flagyl 8/29 =>> day 7 DVT: Lovenox Foley: Elza Rafter in place Follow-up: TBD  Plan: I talked with her adult grandson, his wife and the great-grandson. According to the family the patient has been mostly in bed since her last hospitalization/surgery 07/05/2018.  They said  she is just felt bad and has not wanted to get up around the house.  Her last hospitalization was for difficulty with left encrusted ureteral stent.  This was removed and replaced. She does not seem to be severely distended, she has a few rare bowel sounds.  She got part of the contrast down yesterday so we will get a plain film now just see what has happens since she drank some of the contrast yesterday.  If we do not see any contrast at all we may try to get her to drink some Gastrografin again.  Her admission CT also showed a large - moderate stool burden on admission.  She says she has been up to the chair, but she has refused to walk with physical therapy.  She has had liquid stool in her ostomy bag since admission, and presented with diarrhea and emesis.  We will follow with you.  Contrast on film done this AM shows contrast in the right colon, with ongoing bowel dilatation.  They are saying it is consistent with a small bowel obstruction, but I would think this is more consistent with an ileus, and some degree of constipation, given the current history and exam.   Will review with Dr. Lucia Gaskins.    I would give her some sips and chips and see how she does.  It would be helpful to get her up and walking, but she has declined PT for the last 2 days.    JENNINGS,WILLARD 08/20/2018, 9:32 AM   Agree with above. Her pattern is more of an ileus.  Will follow Her pattern seems more like an ileus.  Alphonsa Overall, MD, [facility] Surgery Pager: [phone] Office phone:  [phone]

## 2018-08-21 DIAGNOSIS — L8990 Pressure ulcer of unspecified site, unspecified stage: Secondary | ICD-10-CM

## 2018-08-21 LAB — BASIC METABOLIC PANEL
ANION GAP: 4 — AB (ref 5–15)
BUN: 10 mg/dL (ref 8–23)
CHLORIDE: 110 mmol/L (ref 98–111)
CO2: 25 mmol/L (ref 22–32)
Calcium: 9 mg/dL (ref 8.9–10.3)
Creatinine, Ser: 0.63 mg/dL (ref 0.44–1.00)
GFR calc Af Amer: >60 mL/min (ref >60)
Glucose, Bld: 107 mg/dL — ABNORMAL HIGH (ref 70–99)
POTASSIUM: 3.7 mmol/L (ref 3.5–5.1)
SODIUM: 139 mmol/L (ref 135–145)

## 2018-08-21 LAB — CBC
HEMATOCRIT: 34.9 % — AB (ref 36.0–46.0)
HEMOGLOBIN: 11.5 g/dL — AB (ref 12.0–15.0)
MCH: 29.8 pg (ref 26.0–34.0)
MCHC: 33 g/dL (ref 30.0–36.0)
MCV: 90.4 fL (ref 78.0–100.0)
Platelets: 220 10*3/uL (ref 150–400)
RBC: 3.86 MIL/uL — ABNORMAL LOW (ref 3.87–5.11)
RDW: 16 % — AB (ref 11.5–15.5)
WBC: 7.4 10*3/uL (ref 4.0–10.5)

## 2018-08-21 LAB — MAGNESIUM: Magnesium: 1.6 mg/dL — ABNORMAL LOW (ref 1.7–2.4)

## 2018-08-21 MED ORDER — POTASSIUM CHLORIDE 10 MEQ/100ML IV SOLN
10.0000 meq | INTRAVENOUS | Status: AC
  Administered 2018-08-21 (×2): 10 meq via INTRAVENOUS
  Filled 2018-08-21 (×2): qty 100

## 2018-08-21 MED ORDER — METOCLOPRAMIDE HCL 5 MG/ML IJ SOLN
2.5000 mg | Freq: Three times a day (TID) | INTRAMUSCULAR | Status: DC
  Administered 2018-08-21 – 2018-08-27 (×18): 2.5 mg via INTRAVENOUS
  Filled 2018-08-21 (×18): qty 2

## 2018-08-21 MED ORDER — MAGNESIUM SULFATE 2 GM/50ML IV SOLN
2.0000 g | Freq: Once | INTRAVENOUS | Status: AC
  Administered 2018-08-21: 2 g via INTRAVENOUS
  Filled 2018-08-21: qty 50

## 2018-08-21 NOTE — Care Management Important Message (Signed)
Important Message  Patient Details  Name: Sherry [name] MRN: [id number] Date of Birth: [birth year]   Medicare Important Message Given:  Yes    Kerin Salen 08/21/2018, 12:16 [place] Message  Patient Details  Name: Sherry [name] MRN: [id number] Date of Birth: [birth year]   Medicare Important Message Given:  Yes    Kerin Salen 08/21/2018, 12:16 PM

## 2018-08-21 NOTE — Progress Notes (Addendum)
PROGRESS NOTE    LYNE KHURANA  ZOX:[id number] DOB: Apr 23, 1923 DOA: 08/14/2018 PCP: Marletta Lor, MD   Brief Narrative:  HPI on 08/14/2018 by Dr. [name] Caris Cerveny  is a 84 y.o. female, w hypertension, hyperlipidemia, CAD, hypothyroidism, Copd, [name], has c/o diarrhea (mild), for the past several days as well as nuasea.  Pt denies fever, chills, abd pain, emesis, constipation, brbpr, black stool, dysuria, hematuria.  Interim history Patient was admitted with diarrhea as well as acute kidney injury.  Now having nausea with what looks like a bowel obstruction versus ileus.  General surgery consulted and appreciated.  Assessment & Plan   Partial SBO versus ileus with nausea -developed nausea and vomiting on 08/19/2018 -KUB was done showing gas air-filled loops in the small bowel -General surgery consulted and appreciated as patient continues to have limited bowel sounds on exam -Abdominal x-ray showed persistent small bowel dilatation contrast has progressed into small bowel and right colon consistent with partial small bowel obstruction -Discussed with general surgery, recommended enema per ostomy. Will start low dose reglan IV.  -Encouraged patient to get out of bed and mobilize.  Diarrhea  -resolved -GI pathogen panel: Enterotoxigenic E. coli -Completed cipro and flagyl  Acute kidney injury -Resolved, creatinine currently 0.63  Urinary tract infection -UA showed cloudy appearance, large leukocytes, positive nitrites, few bacteria, >50 WBC -Urine culture: 80KEnterococcus faecalis, >100k E. Coli -Completed antibiotics  Hypothyroidism -Continue IV Synthroid given the patient is not taking her oral medications  Elevated troponin -Has remained flat -Denies chest pain or shortness of breath -EKG showed nonspecific ST segment changes -Echocardiogram EF 55 to 60%, no regional wall motion abnormalities.  Findings consistent with left ventricular diastolic dysfunction,  intermediate for quantification of dysfunction grade  Skin pressure injury -Continue wound care and turn patient every 2 hours  Vascular dementia -Continue Exelon and Seroquel at bedtime  Essential hypertension -Currently on metoprolol -hydralazine ordered, it seems that patient's BP has been running high for several days and not addressed  Rectal cancer, stage III/tumor  DVT Prophylaxis  lovenox  Code Status: Full  Family Communication: none at bedside  Disposition Plan: Admitted, pending improvement in bowel obstruction  Consultants General surgery  Procedures  None  Antibiotics   Anti-infectives (From admission, onward)   Start     Dose/Rate Route Frequency Ordered Stop   08/18/18 2000  ciprofloxacin (CIPRO) IVPB 400 mg  Status:  Discontinued     400 mg 200 mL/hr over 60 Minutes Intravenous Every 12 hours 08/18/18 1113 08/19/18 1308   08/18/18 1400  metroNIDAZOLE (FLAGYL) IVPB 500 mg  Status:  Discontinued     500 mg 100 mL/hr over 60 Minutes Intravenous Every 8 hours 08/18/18 1113 08/20/18 1338   08/17/18 1400  metroNIDAZOLE (FLAGYL) tablet 500 mg  Status:  Discontinued     500 mg Oral Every 8 hours 08/17/18 0908 08/18/18 1113   08/17/18 1000  ciprofloxacin (CIPRO) tablet 500 mg  Status:  Discontinued     500 mg Oral 2 times daily 08/17/18 0822 08/18/18 1113   08/17/18 0830  cephALEXin (KEFLEX) capsule 500 mg  Status:  Discontinued     500 mg Oral Every 8 hours 08/17/18 0821 08/17/18 0822   08/16/18 0000  ciprofloxacin (CIPRO) 500 MG tablet     500 mg Oral 2 times daily 08/16/18 0929 08/20/18 2359   08/15/18 2000  ciprofloxacin (CIPRO) IVPB 400 mg  Status:  Discontinued     400 mg 200 mL/hr over  60 Minutes Intravenous Every 24 hours 08/14/18 2239 08/17/18 0821   08/15/18 0600  metroNIDAZOLE (FLAGYL) IVPB 500 mg  Status:  Discontinued     500 mg 100 mL/hr over 60 Minutes Intravenous Every 8 hours 08/14/18 2211 08/17/18 0908   08/14/18 1945  ciprofloxacin (CIPRO)  IVPB 400 mg     400 mg 200 mL/hr over 60 Minutes Intravenous  Once 08/14/18 1934 08/14/18 2105   08/14/18 1945  metroNIDAZOLE (FLAGYL) IVPB 500 mg     500 mg 100 mL/hr over 60 Minutes Intravenous  Once 08/14/18 1934 08/14/18 2243      Subjective:   [name] seen and examined today.  Continues to have nausea.  Tried ice chips yesterday but was nauseous.  Denies current chest pain, shortness of breath, domino pain, diarrhea, dizziness or headache.  Objective:   Vitals:   08/20/18 1600 08/20/18 2142 08/21/18 0537 08/21/18 0854  BP: 117/78 (!) 138/95 (!) 140/96 (!) 142/74  Pulse: 90 91 85 69  Resp:  (!) 22 (!) 22 20  Temp:  98.7 F (37.1 C) 97.6 F (36.4 C) (!) 97.5 F (36.4 C)  TempSrc:  Oral Oral Oral  SpO2:  92% 90% 94%  Weight:   50.5 kg   Height:        Intake/Output Summary (Last 24 hours) at 08/21/2018 1043 Last data filed at 08/21/2018 0500 Gross per 24 hour  Intake 941.02 ml  Output 650 ml  Net 291.02 ml   Filed Weights   08/19/18 0430 08/20/18 0500 08/21/18 0537  Weight: 48.9 kg 51.3 kg 50.5 kg   Exam  General: Well developed, well nourished, NAD, appears stated age  39: NCAT,  mucous membranes moist.   Neck: Supple  Cardiovascular: S1 S2 auscultated, RRR  Respiratory: Clear to auscultation bilaterally with equal chest rise  Abdomen: Soft, nontender, + hypoactive bowel sounds, ostomy with liquid stool and air  Extremities: warm dry without cyanosis clubbing or edema  Neuro: AAOx3, nonfocal  Psych: Flat however appropriate  Data Reviewed: I have personally reviewed following labs and imaging studies  CBC: Recent Labs  Lab 08/14/18 1821 08/15/18 0701 08/19/18 0450 08/21/18 0443  WBC 10.5 8.4 10.7* 7.4  NEUTROABS 9.4*  --   --   --   HGB 12.9 11.6* 11.8* 11.5*  HCT 40.1 36.2 36.7 34.9*  MCV 93.0 93.5 91.8 90.4  PLT 239 195 207 564   Basic Metabolic Panel: Recent Labs  Lab 08/14/18 1821 08/15/18 0701 08/19/18 0450 08/20/18 0448  08/21/18 0443  NA 138 139 139 140 139  K 4.3 4.3 3.5 3.6 3.7  CL 100 106 111 111 110  CO2 26 25 19* 24 25  GLUCOSE 80 80 83 160* 107*  BUN 33* 28* 11 10 10   CREATININE 1.12* 0.87 0.72 0.69 0.63  CALCIUM 10.2 9.3 8.7* 9.2 9.0  MG  --   --   --   --  1.6*   GFR: Estimated Creatinine Clearance: 35.8 mL/min (by C-G formula based on SCr of 0.63 mg/dL). Liver Function Tests: Recent Labs  Lab 08/14/18 1821 08/15/18 0701  AST 13* 12*  ALT 12 10  ALKPHOS 78 63  BILITOT 1.2 0.9  PROT 5.9* 4.8*  ALBUMIN 2.9* 2.4*   Recent Labs  Lab 08/14/18 1821  LIPASE 22   No results for input(s): AMMONIA in the last 168 hours. Coagulation Profile: No results for input(s): INR, PROTIME in the last 168 hours. Cardiac Enzymes: Recent Labs  Lab 08/14/18 1821  08/15/18 0043 08/15/18 0701 08/15/18 1315  TROPONINI 0.04* 0.03* 0.03* 0.03*   BNP (last 3 results) No results for input(s): PROBNP in the last 8760 hours. HbA1C: No results for input(s): HGBA1C in the last 72 hours. CBG: No results for input(s): GLUCAP in the last 168 hours. Lipid Profile: No results for input(s): CHOL, HDL, LDLCALC, TRIG, CHOLHDL, LDLDIRECT in the last 72 hours. Thyroid Function Tests: No results for input(s): TSH, T4TOTAL, FREET4, T3FREE, THYROIDAB in the last 72 hours. Anemia Panel: No results for input(s): VITAMINB12, FOLATE, FERRITIN, TIBC, IRON, RETICCTPCT in the last 72 hours. Urine analysis:    Component Value Date/Time   COLORURINE AMBER (A) 08/14/2018 1944   APPEARANCEUR CLOUDY (A) 08/14/2018 1944   LABSPEC 1.018 08/14/2018 1944   LABSPEC 1.030 12/06/2009 1628   PHURINE 5.0 08/14/2018 1944   GLUCOSEU NEGATIVE 08/14/2018 1944   HGBUR MODERATE (A) 08/14/2018 1944   BILIRUBINUR NEGATIVE 08/14/2018 1944   BILIRUBINUR 1+ 02/10/2016 1638   BILIRUBINUR Negative 12/06/2009 1628   KETONESUR 20 (A) 08/14/2018 1944   PROTEINUR 100 (A) 08/14/2018 1944   UROBILINOGEN 1.0 02/10/2016 1638   UROBILINOGEN 1.0  04/01/2010 1500   NITRITE POSITIVE (A) 08/14/2018 1944   LEUKOCYTESUR LARGE (A) 08/14/2018 1944   LEUKOCYTESUR Small 12/06/2009 1628   Sepsis Labs: @LABRCNTIP (procalcitonin:4,lacticidven:4)  ) Recent Results (from the past 240 hour(s))  Urine culture     Status: Abnormal   Collection Time: 08/14/18  7:44 PM  Result Value Ref Range Status   Specimen Description   Final    URINE, CLEAN CATCH Performed at [facility] [address] [place], [place] [address]    Special Requests   Final    NONE Performed at [facility], [place] [address], [place], [place] [address]    Culture (A)  Final    >=100,000 COLONIES/mL ESCHERICHIA COLI 80,000 COLONIES/mL ENTEROCOCCUS FAECALIS    Report Status 08/17/2018 FINAL  Final   Organism ID, Bacteria ESCHERICHIA COLI (A)  Final   Organism ID, Bacteria ENTEROCOCCUS FAECALIS (A)  Final      Susceptibility   Escherichia coli - MIC*    AMPICILLIN <=2 SENSITIVE Sensitive     CEFAZOLIN <=4 SENSITIVE Sensitive     CEFTRIAXONE 8 SENSITIVE Sensitive     CIPROFLOXACIN <=0.25 SENSITIVE Sensitive     GENTAMICIN <=1 SENSITIVE Sensitive     IMIPENEM 0.5 SENSITIVE Sensitive     NITROFURANTOIN <=16 SENSITIVE Sensitive     TRIMETH/SULFA <=20 SENSITIVE Sensitive     AMPICILLIN/SULBACTAM <=2 SENSITIVE Sensitive     PIP/TAZO <=4 SENSITIVE Sensitive     Extended ESBL NEGATIVE Sensitive     * >=100,000 COLONIES/mL ESCHERICHIA COLI   Enterococcus faecalis - MIC*    AMPICILLIN <=2 SENSITIVE Sensitive     LEVOFLOXACIN 0.5 SENSITIVE Sensitive     NITROFURANTOIN <=16 SENSITIVE Sensitive     VANCOMYCIN 1 SENSITIVE Sensitive     * 80,000 COLONIES/mL ENTEROCOCCUS FAECALIS  Gastrointestinal Panel by PCR , Stool     Status: Abnormal   Collection Time: 08/15/18  2:30 PM  Result Value Ref Range Status   Campylobacter species NOT DETECTED NOT DETECTED Final   Plesimonas shigelloides NOT DETECTED NOT DETECTED Final   Salmonella  species NOT DETECTED NOT DETECTED Final   Yersinia enterocolitica NOT DETECTED NOT DETECTED Final   Vibrio species NOT DETECTED NOT DETECTED Final   Vibrio cholerae NOT DETECTED NOT DETECTED Final   Enteroaggregative E coli (EAEC) NOT DETECTED NOT DETECTED  Final   Enteropathogenic E coli (EPEC) NOT DETECTED NOT DETECTED Final   Enterotoxigenic E coli (ETEC) DETECTED (A) NOT DETECTED Final    Comment: RESULT CALLED TO, READ BACK BY AND VERIFIED WITH: ASHLEY BROWN 08/16/18 @ 21  [place] like toxin producing E coli (STEC) NOT DETECTED NOT DETECTED Final   Shigella/Enteroinvasive E coli (EIEC) NOT DETECTED NOT DETECTED Final   Cryptosporidium NOT DETECTED NOT DETECTED Final   Cyclospora cayetanensis NOT DETECTED NOT DETECTED Final   Entamoeba histolytica NOT DETECTED NOT DETECTED Final   Giardia lamblia NOT DETECTED NOT DETECTED Final   Adenovirus F40/41 NOT DETECTED NOT DETECTED Final   Astrovirus NOT DETECTED NOT DETECTED Final   Norovirus GI/GII NOT DETECTED NOT DETECTED Final   Rotavirus A NOT DETECTED NOT DETECTED Final   Sapovirus (I, II, IV, and V) NOT DETECTED NOT DETECTED Final    Comment: Performed at [facility], [address] [place], [place] [address]      Radiology Studies: Dg Abd 1 View  Result Date: 08/20/2018 CLINICAL DATA:  See bowel obstruction, follow-up EXAM: ABDOMEN - 1 VIEW COMPARISON:  08/19/2018 FINDINGS: Contrast is seen within a few dilated small bowel loops and within the RIGHT colon. Persistent dilatation of small bowel loops consistent with partial small bowel obstruction. No definite bowel wall thickening. Gaseous distention of stomach. LEFT ureteral stent again seen. Scattered atherosclerotic calcifications. Bones demineralized. IMPRESSION: Persistent small bowel dilatation though contrast has progressed into the distal small bowel and RIGHT colon consistent with partial small bowel obstruction. Electronically Signed   By: Lavonia Dana M.D.    On: 08/20/2018 13:18   Dg Abd Portable 1v-small Bowel Obstruction Protocol-initial, 8 Hr Delay  Result Date: 08/19/2018 CLINICAL DATA:  Small bowel protocol 8 hour delay EXAM: PORTABLE ABDOMEN - 1 VIEW COMPARISON:  08/19/2018, 08/18/2018, CT 08/14/2018 FINDINGS: Left-sided ureteral stent. Vascular calcifications. Multiple loops of dilated small bowel measuring up to 4 cm. Faint contrast within dilated small bowel loops. Large amount of stool in the right colon. Multiple splenic calcifications/granuloma. IMPRESSION: 1. Faintly visible contrast material within dilated small bowel loops. The slightly dense appearance of right abdominal stools is similar as compared with CT 08/14/2018. Electronically Signed   By: Donavan Foil M.D.   On: 08/19/2018 21:54     Scheduled Meds: . darifenacin  7.5 mg Oral Daily  . enoxaparin (LOVENOX) injection  30 mg Subcutaneous Q24H  . feeding supplement (ENSURE ENLIVE)  237 mL Oral BID BM  . levothyroxine  50 mcg Intravenous Daily  . metoCLOPramide (REGLAN) injection  2.5 mg Intravenous Q8H  . metoprolol succinate  12.5 mg Oral QPM  . QUEtiapine  25 mg Oral QHS   Continuous Infusions: . sodium chloride 100 mL/hr at 08/19/18 0515  . dextrose 5 % and 0.45 % NaCl with KCl 20 mEq/L 75 mL/hr at 08/21/18 0500  . magnesium sulfate 1 - 4 g bolus IVPB 2 g (08/21/18 1028)  . potassium chloride       LOS: 6 days   Time Spent in minutes   30 minutes  Breezy Hertenstein D.O. on 08/21/2018 at 10:43 AM  Between 7am to 7pm - Please see pager noted on [email or web address]  After 7pm go to [email or web address]  And look for the night coverage person covering for me after hours  [facility] Office  [phone]

## 2018-08-21 NOTE — Progress Notes (Signed)
Nutrition Follow-up  DOCUMENTATION CODES:   Underweight, Severe malnutrition in context of chronic illness  INTERVENTION:    Monitor for diet advancement/toleration  **If diet is unable to be advanced in the next 24-48 hrs recommend initiation of nutrition support.   NUTRITION DIAGNOSIS:   Severe Malnutrition related to chronic illness, cancer and cancer related treatments as evidenced by percent weight loss, energy intake < or equal to 75% for > or equal to 1 month, moderate fat depletion, moderate muscle depletion, severe muscle depletion.  Ongoing  GOAL:   Patient will meet greater than or equal to 90% of their needs  Not meeting  MONITOR:   PO intake, Weight trends, Labs, Supplement acceptance  REASON FOR ASSESSMENT:   Malnutrition Screening Tool    ASSESSMENT:   Patient with PMH significant for HTN, HLD, CAD, COPD, and GERD. Presents this admission with complaint of mild diarrhea and nausea for the last several days. Admitted for AKI with possible UTI.    8/29- NPO 8/31-low sodium heart healthy diet 9/2- NPO 9/3- pt developed nausea/vomiting, xray consistent with small bowel obstruction  Pt continues to be NPO. Colostomy producing air. She denies any vomiting and complained of nausea last night. Pt has been without adequate nutrition for 6 days. If diet is unable to be advanced in the next 24-48 hrs recommend initiation of nutrition support as pt is severely malnourished.   Will need to monitor magnesium, potassium, and phosphorus daily for at least 3 days, MD to replete as needed, as pt is at risk for refeeding syndrome.   Weight noted to increase from 97 lb on 8/30 to 111 lb today. Will continue to monitor trends.   Medications reviewed and include: reglan 2.5 mg BID, D5 @ 75 ml/hr, Mg sulfate  Labs reviewed: Mg 1.6 (L)   Diet Order:   Diet Order            Diet NPO time specified Except for: Ice Chips, Other (See Comments)  Diet effective now       Diet - low sodium heart healthy              EDUCATION NEEDS:   Education needs have been addressed  Skin:  Skin Assessment: Reviewed RN Assessment  Last BM:  08/20/18  Height:   Ht Readings from Last 1 Encounters:  08/15/18 5\' 3"  (1.6 m)    Weight:   Wt Readings from Last 1 Encounters:  08/21/18 50.5 kg    Ideal Body Weight:  52.2 kg  BMI:  Body mass index is 19.72 kg/m.  Estimated Nutritional Needs:   Kcal:  1350-1550 kcal  Protein:  65-80 grams  Fluid:  >/= 1.3 L/day  Mariana Single RD, LDN Clinical Nutrition Pager # - [phone]

## 2018-08-21 NOTE — Progress Notes (Addendum)
[facility] Progress Note     Subjective: CC-  Sitting up in bed. Per chart patient complained of nausea over night with ice, no emesis. This morning she denies any abdominal pain or nausea.  States that she did not get out of bed yesterday because she didn't feel like it, but might be agreeable to do so today. Ostomy pouch full of air this morning.  Objective: Vital signs in last 24 hours: Temp:  [97.5 F (36.4 C)-98.7 F (37.1 C)] 97.5 F (36.4 C) (09/05 0854) Pulse Rate:  [69-91] 69 (09/05 0854) Resp:  [18-22] 20 (09/05 0854) BP: (117-167)/(74-99) 142/74 (09/05 0854) SpO2:  [90 %-94 %] 94 % (09/05 0854) Weight:  [50.5 kg] 50.5 kg (09/05 0537) Last BM Date: 08/20/18  Intake/Output from previous day: 09/04 0701 - 09/05 0700 In: 941 [I.V.:941] Out: 1000 [Urine:1000] Intake/Output this shift: No intake/output data recorded.  PE: Gen:  Alert, NAD HEENT: EOM's intact, pupils equal and round Card:  RRR Pulm:  CTAB, no W/R/R, effort normal Abd: Soft, ND, nontender, +BS in all 4 quadrants, ostomy pouch full of air with trace liquid stool Skin: no rashes noted, warm and dry  Lab Results:  Recent Labs    08/19/18 0450 08/21/18 0443  WBC 10.7* 7.4  HGB 11.8* 11.5*  HCT 36.7 34.9*  PLT 207 220   BMET Recent Labs    08/20/18 0448 08/21/18 0443  NA 140 139  K 3.6 3.7  CL 111 110  CO2 24 25  GLUCOSE 160* 107*  BUN 10 10  CREATININE 0.69 0.63  CALCIUM 9.2 9.0   PT/INR No results for input(s): LABPROT, INR in the last 72 hours. CMP     Component Value Date/Time   NA 139 08/21/2018 0443   NA 141 09/27/2015 0939   K 3.7 08/21/2018 0443   K 4.2 09/27/2015 0939   CL 110 08/21/2018 0443   CL 106 04/01/2013 1506   CO2 25 08/21/2018 0443   CO2 26 09/27/2015 0939   GLUCOSE 107 (H) 08/21/2018 0443   GLUCOSE 113 09/27/2015 0939   GLUCOSE 121 (H) 04/01/2013 1506   BUN 10 08/21/2018 0443   BUN 14.4 09/27/2015 0939   CREATININE 0.63 08/21/2018 0443    CREATININE 0.72 02/10/2016 1718   CREATININE 0.8 09/27/2015 0939   CALCIUM 9.0 08/21/2018 0443   CALCIUM 10.1 09/27/2015 0939   PROT 4.8 (L) 08/15/2018 0701   PROT 6.3 (L) 09/27/2015 0939   ALBUMIN 2.4 (L) 08/15/2018 0701   ALBUMIN 3.7 09/27/2015 0939   AST 12 (L) 08/15/2018 0701   AST 12 09/27/2015 0939   ALT 10 08/15/2018 0701   ALT 10 09/27/2015 0939   ALKPHOS 63 08/15/2018 0701   ALKPHOS 127 09/27/2015 0939   BILITOT 0.9 08/15/2018 0701   BILITOT 0.50 09/27/2015 0939   GFRNONAA >60 08/21/2018 0443   GFRAA >60 08/21/2018 0443   Lipase     Component Value Date/Time   LIPASE 22 08/14/2018 1821       Studies/Results: Dg Abd 1 View  Result Date: 08/20/2018 CLINICAL DATA:  See bowel obstruction, follow-up EXAM: ABDOMEN - 1 VIEW COMPARISON:  08/19/2018 FINDINGS: Contrast is seen within a few dilated small bowel loops and within the RIGHT colon. Persistent dilatation of small bowel loops consistent with partial small bowel obstruction. No definite bowel wall thickening. Gaseous distention of stomach. LEFT ureteral stent again seen. Scattered atherosclerotic calcifications. Bones demineralized. IMPRESSION: Persistent small bowel dilatation though contrast has progressed into the  distal small bowel and RIGHT colon consistent with partial small bowel obstruction. Electronically Signed   By: Lavonia Dana M.D.   On: 08/20/2018 13:18   Dg Abd Portable 1v-small Bowel Obstruction Protocol-initial, 8 Hr Delay  Result Date: 08/19/2018 CLINICAL DATA:  Small bowel protocol 8 hour delay EXAM: PORTABLE ABDOMEN - 1 VIEW COMPARISON:  08/19/2018, 08/18/2018, CT 08/14/2018 FINDINGS: Left-sided ureteral stent. Vascular calcifications. Multiple loops of dilated small bowel measuring up to 4 cm. Faint contrast within dilated small bowel loops. Large amount of stool in the right colon. Multiple splenic calcifications/granuloma. IMPRESSION: 1. Faintly visible contrast material within dilated small bowel  loops. The slightly dense appearance of right abdominal stools is similar as compared with CT 08/14/2018. Electronically Signed   By: Donavan Foil M.D.   On: 08/19/2018 21:54    Anti-infectives: Anti-infectives (From admission, onward)   Start     Dose/Rate Route Frequency Ordered Stop   08/18/18 2000  ciprofloxacin (CIPRO) IVPB 400 mg  Status:  Discontinued     400 mg 200 mL/hr over 60 Minutes Intravenous Every 12 hours 08/18/18 1113 08/19/18 1308   08/18/18 1400  metroNIDAZOLE (FLAGYL) IVPB 500 mg  Status:  Discontinued     500 mg 100 mL/hr over 60 Minutes Intravenous Every 8 hours 08/18/18 1113 08/20/18 1338   08/17/18 1400  metroNIDAZOLE (FLAGYL) tablet 500 mg  Status:  Discontinued     500 mg Oral Every 8 hours 08/17/18 0908 08/18/18 1113   08/17/18 1000  ciprofloxacin (CIPRO) tablet 500 mg  Status:  Discontinued     500 mg Oral 2 times daily 08/17/18 0822 08/18/18 1113   08/17/18 0830  cephALEXin (KEFLEX) capsule 500 mg  Status:  Discontinued     500 mg Oral Every 8 hours 08/17/18 0821 08/17/18 0822   08/16/18 0000  ciprofloxacin (CIPRO) 500 MG tablet     500 mg Oral 2 times daily 08/16/18 0929 08/20/18 2359   08/15/18 2000  ciprofloxacin (CIPRO) IVPB 400 mg  Status:  Discontinued     400 mg 200 mL/hr over 60 Minutes Intravenous Every 24 hours 08/14/18 2239 08/17/18 0821   08/15/18 0600  metroNIDAZOLE (FLAGYL) IVPB 500 mg  Status:  Discontinued     500 mg 100 mL/hr over 60 Minutes Intravenous Every 8 hours 08/14/18 2211 08/17/18 0908   08/14/18 1945  ciprofloxacin (CIPRO) IVPB 400 mg     400 mg 200 mL/hr over 60 Minutes Intravenous  Once 08/14/18 1934 08/14/18 2105   08/14/18 1945  metroNIDAZOLE (FLAGYL) IVPB 500 mg     500 mg 100 mL/hr over 60 Minutes Intravenous  Once 08/14/18 1934 08/14/18 2243     Assessment/Plan T2N1 stage III rectal cancer -APR 2011/chemotherapy Parastomal hernia repair 2011 status post appendectomy, cholecystectomy, multiple ureteral stent  placements, bladder tumor resection 01/2018 AKI -resolved Elevated troponins -  Hypertension Hypothyroid History of vascular dementia -on Exelon/Seroquel Hx of tobacco use AAA 5.1 cm  Nausea, vomiting and diarrhea SBO versus ileus (more likely ileus) E. coli UTI/enterotoxic E. Coli - positive GI panel  - Xray 9/4 showed contrast in the right colon with ongoing bowel dilatation  - flatus in colostomy pouch today and good bowel sounds on exam  FEN: IV fluids, sips/chips ID: Cipro 8/29>>08/19/18; Flagyl 8/29>>9/4 DVT: Lovenox Foley: Wick in place Follow-up: TBD  Plan: Replace K/Mag to keep K>4 and Mag>2. Discussed importance of mobilization/getting OOB.   Will order an enema per ostomy.   Continue sips of clears/ice chips as  tolerated.   LOS: 6 days    Wellington Hampshire , [facility] 08/21/2018, 9:08 AM Pager: [phone] Consults: [phone]  Agree with above. Daughter, Darletta Moll, in room with patient. Ostomy nurse irrigating LUQ ostomy at this time.  Alphonsa Overall, MD, [facility] Pager: [phone] Office phone:  [phone]

## 2018-08-21 NOTE — Consult Note (Signed)
[place] Nurse ostomy consult note Stoma type/location: LLQ, end colostomy Has had stoma since 2011, had been independent in the care of her ostomy until last few years when she has worsening dementia and now her daughter has assumed care. He daughter reports the patient has be bed bound essentially for some time and while she is able to answer questions and follow commands she doe not remember how to change pouch or even let anyone know when her incontinence briefs and/or pads are wet. Stomal assessment/size: 1" round, budded, very dry and mucosa is yellow. Daughter reports it did not look like this. The yellow material is adherent to the stoma and it is extremely dry. Dr. Lucia Gaskins into assess, he was able to pull some of the sloughy material away from the stoma. Stoma is pink and moist under this. May be caked on stool because her daughter does report she has a "weak stomach" and can barely clean  Peristomal assessment: intact, with no skin irritation  Treatment options for stomal/peristomal skin: NA Output  WOC nurse ordered for ostomy for irrigation of stoma. Soap suds enema ordered. Volume not specified, verified with Dr. Lucia Gaskins for 27cc Digitally explored the stoma, however it is very tight an I am unable to determine the direction of the bowel  Ostomy pouching: 2pc. 2 1/4 is what this patient normally uses, I have changed the pouch and placed her into a 2 3/4" pouch in order to be able to attach the irrigation sleeve to the skin barrier. Attached sleeve to skin barrier, attempted to irrigate with soap suds enema. Difficulty to get the fluid to infuse. digitally explored the stoma again and determined that the bowel runs proximal from the os at 12 o'clock.  After readjusting the cone tip which has be lubricated with surgilube I was able with the assistance of Fara Olden OCA to get 750cc of soap suds enema to infuse. Closed off the irrigation sleeve and secured clamp at the bottom of the sleeve.  Patient tolerated well. Brooke PA at the bedside during enema.  Orders to remove irrigation sleeve and replace with 2 3/4" pouch to the existing skin barrier.   SAVE ALL OF THE IRRIGATION SLEEVE AND IRRIGATION SET MATERIALS. LEAVE IN PATIENTS BATHROOM.  [place] nurse will remain available to team, re-consult if needed for further ostomy support.  [place] MSN, [place], [place], [place]

## 2018-08-22 LAB — CBC
HCT: 34.8 % — ABNORMAL LOW (ref 36.0–46.0)
Hemoglobin: 11.5 g/dL — ABNORMAL LOW (ref 12.0–15.0)
MCH: 29.9 pg (ref 26.0–34.0)
MCHC: 33 g/dL (ref 30.0–36.0)
MCV: 90.4 fL (ref 78.0–100.0)
PLATELETS: 214 10*3/uL (ref 150–400)
RBC: 3.85 MIL/uL — AB (ref 3.87–5.11)
RDW: 16 % — AB (ref 11.5–15.5)
WBC: 7 10*3/uL (ref 4.0–10.5)

## 2018-08-22 LAB — BASIC METABOLIC PANEL
ANION GAP: 3 — AB (ref 5–15)
BUN: 10 mg/dL (ref 8–23)
CALCIUM: 8.7 mg/dL — AB (ref 8.9–10.3)
CO2: 27 mmol/L (ref 22–32)
CREATININE: 0.56 mg/dL (ref 0.44–1.00)
Chloride: 107 mmol/L (ref 98–111)
Glucose, Bld: 114 mg/dL — ABNORMAL HIGH (ref 70–99)
Potassium: 3.9 mmol/L (ref 3.5–5.1)
SODIUM: 137 mmol/L (ref 135–145)

## 2018-08-22 LAB — MAGNESIUM: MAGNESIUM: 1.9 mg/dL (ref 1.7–2.4)

## 2018-08-22 MED ORDER — DOCUSATE SODIUM 100 MG PO CAPS
100.0000 mg | ORAL_CAPSULE | Freq: Two times a day (BID) | ORAL | Status: DC
  Administered 2018-08-22 – 2018-08-27 (×11): 100 mg via ORAL
  Filled 2018-08-22 (×11): qty 1

## 2018-08-22 NOTE — Progress Notes (Signed)
PT Cancellation Note  Patient Details Name: Sherry [name] MRN: [id number] DOB: 1923-02-24   Cancelled Treatment:    Reason Eval/Treat Not Completed: Patient declined, no reason specified;Fatigue/lethargy limiting ability to participate - PT attempted mobility twice with pt today, first time pt refused due to nausea, and second time RN states that the pt "does not know" if she can do physical therapy, and RN deferred PT. Signing off at this time due to multiple refusals/cancellations. Re-order PT as appropriate.   Julien Girt, PT, DPT  Pager # [phone]     Roxine Caddy D Dathan Attia 08/22/2018, 5:27 PM

## 2018-08-22 NOTE — Progress Notes (Addendum)
PROGRESS NOTE    Sherry [name]  HYI:[id number] DOB: February 27, 1923 DOA: 08/14/2018 PCP: Marletta Lor, MD   Brief Narrative:  HPI on 08/14/2018 by Dr. Jani [name] Sherry [name]  is a 84 y.o. female, w hypertension, hyperlipidemia, CAD, hypothyroidism, Copd, [name], has c/o diarrhea (mild), for the past several days as well as nuasea.  Pt denies fever, chills, abd pain, emesis, constipation, brbpr, black stool, dysuria, hematuria.  Interim history Patient was admitted with diarrhea as well as acute kidney injury.  Now having nausea with what looks like a bowel obstruction versus ileus.  General surgery consulted and appreciated.  Assessment & Plan   Partial SBO versus ileus with nausea -developed nausea and vomiting on 08/19/2018 -KUB was done showing gas air-filled loops in the small bowel -General surgery consulted and appreciated as patient continues to have limited bowel sounds on exam -Abdominal x-ray showed persistent small bowel dilatation contrast has progressed into small bowel and right colon consistent with partial small bowel obstruction -Discussed with general surgery, recommended enema per ostomy. Continue low dose reglan  -Encouraged patient to get out of bed and mobilize. -Appears to be improving -will place patient on clear liquids today and monitor   Diarrhea  -resolved -GI pathogen panel: Enterotoxigenic E. coli -Completed cipro and flagyl  Acute kidney injury -Resolved, creatinine currently 0.56  Urinary tract infection -UA showed cloudy appearance, large leukocytes, positive nitrites, few bacteria, >50 WBC -Urine culture: 80KEnterococcus faecalis, >100k E. Coli -Completed antibiotics  Hypomagnesemia -resolved with replacement, continue to monitor  Hypothyroidism -Continue IV Synthroid given the patient is not taking her oral medications  Elevated troponin -Has remained flat -Denies chest pain or shortness of breath -EKG showed nonspecific ST segment  changes -Echocardiogram EF 55 to 60%, no regional wall motion abnormalities.  Findings consistent with left ventricular diastolic dysfunction, intermediate for quantification of dysfunction grade  Skin pressure injury -Continue wound care and turn patient every 2 hours -Sacrum, stage II  Vascular dementia -Continue Exelon and Seroquel at bedtime  Essential hypertension -Currently on metoprolol -hydralazine ordered, it seems that patient's BP has been running high for several days and not addressed  Rectal cancer, stage III/tumor  DVT Prophylaxis  lovenox  Code Status: Full  Family Communication: none at bedside  Disposition Plan: Admitted, pending improvement in bowel obstruction/ ileus   Consultants General surgery  Procedures  None  Antibiotics   Anti-infectives (From admission, onward)   Start     Dose/Rate Route Frequency Ordered Stop   08/18/18 2000  ciprofloxacin (CIPRO) IVPB 400 mg  Status:  Discontinued     400 mg 200 mL/hr over 60 Minutes Intravenous Every 12 hours 08/18/18 1113 08/19/18 1308   08/18/18 1400  metroNIDAZOLE (FLAGYL) IVPB 500 mg  Status:  Discontinued     500 mg 100 mL/hr over 60 Minutes Intravenous Every 8 hours 08/18/18 1113 08/20/18 1338   08/17/18 1400  metroNIDAZOLE (FLAGYL) tablet 500 mg  Status:  Discontinued     500 mg Oral Every 8 hours 08/17/18 0908 08/18/18 1113   08/17/18 1000  ciprofloxacin (CIPRO) tablet 500 mg  Status:  Discontinued     500 mg Oral 2 times daily 08/17/18 0822 08/18/18 1113   08/17/18 0830  cephALEXin (KEFLEX) capsule 500 mg  Status:  Discontinued     500 mg Oral Every 8 hours 08/17/18 0821 08/17/18 0822   08/16/18 0000  ciprofloxacin (CIPRO) 500 MG tablet     500 mg Oral 2 times daily 08/16/18 7867  08/20/18 2359   08/15/18 2000  ciprofloxacin (CIPRO) IVPB 400 mg  Status:  Discontinued     400 mg 200 mL/hr over 60 Minutes Intravenous Every 24 hours 08/14/18 2239 08/17/18 0821   08/15/18 0600  metroNIDAZOLE  (FLAGYL) IVPB 500 mg  Status:  Discontinued     500 mg 100 mL/hr over 60 Minutes Intravenous Every 8 hours 08/14/18 2211 08/17/18 0908   08/14/18 1945  ciprofloxacin (CIPRO) IVPB 400 mg     400 mg 200 mL/hr over 60 Minutes Intravenous  Once 08/14/18 1934 08/14/18 2105   08/14/18 1945  metroNIDAZOLE (FLAGYL) IVPB 500 mg     500 mg 100 mL/hr over 60 Minutes Intravenous  Once 08/14/18 1934 08/14/18 2243      Subjective:   [name] seen and examined today.  Denies further nausea or vomiting.  Feels that she had a bowel movement yesterday after enema.  Denies current chest pain, shortness of breath, abdominal pain, dizziness or headache.  Would like to drink and eat something.  Objective:   Vitals:   08/21/18 1703 08/21/18 2048 08/22/18 0545 08/22/18 1229  BP: (!) 167/99 (!) 163/97 (!) 139/93 (!) 139/95  Pulse: 82 79 80 83  Resp:  20 12 16   Temp: (!) 97.5 F (36.4 C) 98.3 F (36.8 C) 98.7 F (37.1 C) 98.1 F (36.7 C)  TempSrc: Oral Oral Oral Oral  SpO2: 97% 94% 94% 97%  Weight:      Height:        Intake/Output Summary (Last 24 hours) at 08/22/2018 1258 Last data filed at 08/22/2018 1245 Gross per 24 hour  Intake 1143.41 ml  Output 1600 ml  Net -456.59 ml   Filed Weights   08/19/18 0430 08/20/18 0500 08/21/18 0537  Weight: 48.9 kg 51.3 kg 50.5 kg   Exam  General: Well developed, well nourished, elderly, no apparent distress  HEENT: NCAT, mucous membranes moist.   Neck: Supple  Cardiovascular: S1 S2 auscultated, no rubs, murmurs or gallops. Regular rate and rhythm.  Respiratory: Clear to auscultation bilaterally with equal chest rise  Abdomen: Soft, nontender, nondistended, + bowel sounds, ostomy  Extremities: warm dry without cyanosis clubbing or edema  Neuro: AAOx3, nofnocal  Psych: Appropriate mood and affect  Data Reviewed: I have personally reviewed following labs and imaging studies  CBC: Recent Labs  Lab 08/19/18 0450 08/21/18 0443  08/22/18 0357  WBC 10.7* 7.4 7.0  HGB 11.8* 11.5* 11.5*  HCT 36.7 34.9* 34.8*  MCV 91.8 90.4 90.4  PLT 207 220 169   Basic Metabolic Panel: Recent Labs  Lab 08/19/18 0450 08/20/18 0448 08/21/18 0443 08/22/18 0357  NA 139 140 139 137  K 3.5 3.6 3.7 3.9  CL 111 111 110 107  CO2 19* 24 25 27   GLUCOSE 83 160* 107* 114*  BUN 11 10 10 10   CREATININE 0.72 0.69 0.63 0.56  CALCIUM 8.7* 9.2 9.0 8.7*  MG  --   --  1.6* 1.9   GFR: Estimated Creatinine Clearance: 35.8 mL/min (by C-G formula based on SCr of 0.56 mg/dL). Liver Function Tests: No results for input(s): AST, ALT, ALKPHOS, BILITOT, PROT, ALBUMIN in the last 168 hours. No results for input(s): LIPASE, AMYLASE in the last 168 hours. No results for input(s): AMMONIA in the last 168 hours. Coagulation Profile: No results for input(s): INR, PROTIME in the last 168 hours. Cardiac Enzymes: Recent Labs  Lab 08/15/18 1315  TROPONINI 0.03*   BNP (last 3 results) No results for input(s):  PROBNP in the last 8760 hours. HbA1C: No results for input(s): HGBA1C in the last 72 hours. CBG: No results for input(s): GLUCAP in the last 168 hours. Lipid Profile: No results for input(s): CHOL, HDL, LDLCALC, TRIG, CHOLHDL, LDLDIRECT in the last 72 hours. Thyroid Function Tests: No results for input(s): TSH, T4TOTAL, FREET4, T3FREE, THYROIDAB in the last 72 hours. Anemia Panel: No results for input(s): VITAMINB12, FOLATE, FERRITIN, TIBC, IRON, RETICCTPCT in the last 72 hours. Urine analysis:    Component Value Date/Time   COLORURINE AMBER (A) 08/14/2018 1944   APPEARANCEUR CLOUDY (A) 08/14/2018 1944   LABSPEC 1.018 08/14/2018 1944   LABSPEC 1.030 12/06/2009 1628   PHURINE 5.0 08/14/2018 1944   GLUCOSEU NEGATIVE 08/14/2018 1944   HGBUR MODERATE (A) 08/14/2018 1944   BILIRUBINUR NEGATIVE 08/14/2018 1944   BILIRUBINUR 1+ 02/10/2016 1638   BILIRUBINUR Negative 12/06/2009 1628   KETONESUR 20 (A) 08/14/2018 1944   PROTEINUR 100 (A)  08/14/2018 1944   UROBILINOGEN 1.0 02/10/2016 1638   UROBILINOGEN 1.0 04/01/2010 1500   NITRITE POSITIVE (A) 08/14/2018 1944   LEUKOCYTESUR LARGE (A) 08/14/2018 1944   LEUKOCYTESUR Small 12/06/2009 1628   Sepsis Labs: @LABRCNTIP (procalcitonin:4,lacticidven:4)  ) Recent Results (from the past 240 hour(s))  Urine culture     Status: Abnormal   Collection Time: 08/14/18  7:44 PM  Result Value Ref Range Status   Specimen Description   Final    URINE, CLEAN CATCH Performed at [facility], [place] [address] [place], [place] [address]    Special Requests   Final    NONE Performed at [facility], [place] [address] [place], [place] [address]    Culture (A)  Final    >=100,000 COLONIES/mL ESCHERICHIA COLI 80,000 COLONIES/mL ENTEROCOCCUS FAECALIS    Report Status 08/17/2018 FINAL  Final   Organism ID, Bacteria ESCHERICHIA COLI (A)  Final   Organism ID, Bacteria ENTEROCOCCUS FAECALIS (A)  Final      Susceptibility   Escherichia coli - MIC*    AMPICILLIN <=2 SENSITIVE Sensitive     CEFAZOLIN <=4 SENSITIVE Sensitive     CEFTRIAXONE 8 SENSITIVE Sensitive     CIPROFLOXACIN <=0.25 SENSITIVE Sensitive     GENTAMICIN <=1 SENSITIVE Sensitive     IMIPENEM 0.5 SENSITIVE Sensitive     NITROFURANTOIN <=16 SENSITIVE Sensitive     TRIMETH/SULFA <=20 SENSITIVE Sensitive     AMPICILLIN/SULBACTAM <=2 SENSITIVE Sensitive     PIP/TAZO <=4 SENSITIVE Sensitive     Extended ESBL NEGATIVE Sensitive     * >=100,000 COLONIES/mL ESCHERICHIA COLI   Enterococcus faecalis - MIC*    AMPICILLIN <=2 SENSITIVE Sensitive     LEVOFLOXACIN 0.5 SENSITIVE Sensitive     NITROFURANTOIN <=16 SENSITIVE Sensitive     VANCOMYCIN 1 SENSITIVE Sensitive     * 80,000 COLONIES/mL ENTEROCOCCUS FAECALIS  Gastrointestinal Panel by PCR , Stool     Status: Abnormal   Collection Time: 08/15/18  2:30 PM  Result Value Ref Range Status   Campylobacter species NOT DETECTED NOT DETECTED Final    Plesimonas shigelloides NOT DETECTED NOT DETECTED Final   Salmonella species NOT DETECTED NOT DETECTED Final   Yersinia enterocolitica NOT DETECTED NOT DETECTED Final   Vibrio species NOT DETECTED NOT DETECTED Final   Vibrio cholerae NOT DETECTED NOT DETECTED Final   Enteroaggregative E coli (EAEC) NOT DETECTED NOT DETECTED Final   Enteropathogenic E coli (EPEC) NOT DETECTED NOT DETECTED Final   Enterotoxigenic E coli (ETEC) DETECTED (A) NOT DETECTED  Final    Comment: RESULT CALLED TO, READ BACK BY AND VERIFIED WITH: ASHLEY BROWN 08/16/18 @ 28  [place] like toxin producing E coli (STEC) NOT DETECTED NOT DETECTED Final   Shigella/Enteroinvasive E coli (EIEC) NOT DETECTED NOT DETECTED Final   Cryptosporidium NOT DETECTED NOT DETECTED Final   Cyclospora cayetanensis NOT DETECTED NOT DETECTED Final   Entamoeba histolytica NOT DETECTED NOT DETECTED Final   Giardia lamblia NOT DETECTED NOT DETECTED Final   Adenovirus F40/41 NOT DETECTED NOT DETECTED Final   Astrovirus NOT DETECTED NOT DETECTED Final   Norovirus GI/GII NOT DETECTED NOT DETECTED Final   Rotavirus A NOT DETECTED NOT DETECTED Final   Sapovirus (I, II, IV, and V) NOT DETECTED NOT DETECTED Final    Comment: Performed at [facility], [address], [place], [place] [address]      Radiology Studies: No results found.   Scheduled Meds: . darifenacin  7.5 mg Oral Daily  . docusate sodium  100 mg Oral BID  . enoxaparin (LOVENOX) injection  30 mg Subcutaneous Q24H  . levothyroxine  50 mcg Intravenous Daily  . metoCLOPramide (REGLAN) injection  2.5 mg Intravenous Q8H  . metoprolol succinate  12.5 mg Oral QPM  . QUEtiapine  25 mg Oral QHS   Continuous Infusions: . sodium chloride 100 mL/hr at 08/19/18 0515  . dextrose 5 % and 0.45 % NaCl with KCl 20 mEq/L 75 mL/hr at 08/22/18 0700     LOS: 7 days   Time Spent in minutes   30 minutes  Jove Beyl D.O. on 08/22/2018 at 12:58 PM  Between 7am to 7pm -  Please see pager noted on [email or web address]  After 7pm go to [email or web address]  And look for the night coverage person covering for me after hours  [facility] Office  [phone]

## 2018-08-22 NOTE — Progress Notes (Signed)
Patient refused to get out of bed today x2. Pt request to have more time to rest. PT attempted also without success.

## 2018-08-22 NOTE — Progress Notes (Addendum)
[facility] Progress Note     Subjective: CC-  Tired this morning. Denies any nausea. No emesis in >48 hours. Minimal output with enema yesterday. States that she was not given any sips of liquids yesterday.  She did get OOB and sit in the chair for a while yesterday.  Objective: Vital signs in last 24 hours: Temp:  [97.5 F (36.4 C)-98.7 F (37.1 C)] 98.7 F (37.1 C) (09/06 0545) Pulse Rate:  [79-84] 80 (09/06 0545) Resp:  [12-20] 12 (09/06 0545) BP: (139-167)/(93-102) 139/93 (09/06 0545) SpO2:  [94 %-97 %] 94 % (09/06 0545) Last BM Date: 08/21/18  Intake/Output from previous day: 09/05 0701 - 09/06 0700 In: 963.4 [I.V.:963.4] Out: 700 [Urine:200; Stool:500] Intake/Output this shift: Total I/O In: -  Out: 600 [Stool:600]  PE: Gen:  Alert, NAD HEENT: EOM's intact, pupils equal and round Card:  RRR Pulm:  CTAB, no W/R/R, effort normal Abd: Soft, ND, nontender, +BS in all 4 quadrants, ostomy with trace liquid stool and some flatus in pouch Skin: no rashes noted, warm and dry   Lab Results:  Recent Labs    08/21/18 0443 08/22/18 0357  WBC 7.4 7.0  HGB 11.5* 11.5*  HCT 34.9* 34.8*  PLT 220 214   BMET Recent Labs    08/21/18 0443 08/22/18 0357  NA 139 137  K 3.7 3.9  CL 110 107  CO2 25 27  GLUCOSE 107* 114*  BUN 10 10  CREATININE 0.63 0.56  CALCIUM 9.0 8.7*   PT/INR No results for input(s): LABPROT, INR in the last 72 hours. CMP     Component Value Date/Time   NA 137 08/22/2018 0357   NA 141 09/27/2015 0939   K 3.9 08/22/2018 0357   K 4.2 09/27/2015 0939   CL 107 08/22/2018 0357   CL 106 04/01/2013 1506   CO2 27 08/22/2018 0357   CO2 26 09/27/2015 0939   GLUCOSE 114 (H) 08/22/2018 0357   GLUCOSE 113 09/27/2015 0939   GLUCOSE 121 (H) 04/01/2013 1506   BUN 10 08/22/2018 0357   BUN 14.4 09/27/2015 0939   CREATININE 0.56 08/22/2018 0357   CREATININE 0.72 02/10/2016 1718   CREATININE 0.8 09/27/2015 0939   CALCIUM 8.7 (L)  08/22/2018 0357   CALCIUM 10.1 09/27/2015 0939   PROT 4.8 (L) 08/15/2018 0701   PROT 6.3 (L) 09/27/2015 0939   ALBUMIN 2.4 (L) 08/15/2018 0701   ALBUMIN 3.7 09/27/2015 0939   AST 12 (L) 08/15/2018 0701   AST 12 09/27/2015 0939   ALT 10 08/15/2018 0701   ALT 10 09/27/2015 0939   ALKPHOS 63 08/15/2018 0701   ALKPHOS 127 09/27/2015 0939   BILITOT 0.9 08/15/2018 0701   BILITOT 0.50 09/27/2015 0939   GFRNONAA >60 08/22/2018 0357   GFRAA >60 08/22/2018 0357   Lipase     Component Value Date/Time   LIPASE 22 08/14/2018 1821       Studies/Results: Dg Abd 1 View  Result Date: 08/20/2018 CLINICAL DATA:  See bowel obstruction, follow-up EXAM: ABDOMEN - 1 VIEW COMPARISON:  08/19/2018 FINDINGS: Contrast is seen within a few dilated small bowel loops and within the RIGHT colon. Persistent dilatation of small bowel loops consistent with partial small bowel obstruction. No definite bowel wall thickening. Gaseous distention of stomach. LEFT ureteral stent again seen. Scattered atherosclerotic calcifications. Bones demineralized. IMPRESSION: Persistent small bowel dilatation though contrast has progressed into the distal small bowel and RIGHT colon consistent with partial small bowel obstruction. Electronically Signed  By: Lavonia Dana M.D.   On: 08/20/2018 13:18    Anti-infectives: Anti-infectives (From admission, onward)   Start     Dose/Rate Route Frequency Ordered Stop   08/18/18 2000  ciprofloxacin (CIPRO) IVPB 400 mg  Status:  Discontinued     400 mg 200 mL/hr over 60 Minutes Intravenous Every 12 hours 08/18/18 1113 08/19/18 1308   08/18/18 1400  metroNIDAZOLE (FLAGYL) IVPB 500 mg  Status:  Discontinued     500 mg 100 mL/hr over 60 Minutes Intravenous Every 8 hours 08/18/18 1113 08/20/18 1338   08/17/18 1400  metroNIDAZOLE (FLAGYL) tablet 500 mg  Status:  Discontinued     500 mg Oral Every 8 hours 08/17/18 0908 08/18/18 1113   08/17/18 1000  ciprofloxacin (CIPRO) tablet 500 mg  Status:   Discontinued     500 mg Oral 2 times daily 08/17/18 0822 08/18/18 1113   08/17/18 0830  cephALEXin (KEFLEX) capsule 500 mg  Status:  Discontinued     500 mg Oral Every 8 hours 08/17/18 0821 08/17/18 0822   08/16/18 0000  ciprofloxacin (CIPRO) 500 MG tablet     500 mg Oral 2 times daily 08/16/18 0929 08/20/18 2359   08/15/18 2000  ciprofloxacin (CIPRO) IVPB 400 mg  Status:  Discontinued     400 mg 200 mL/hr over 60 Minutes Intravenous Every 24 hours 08/14/18 2239 08/17/18 0821   08/15/18 0600  metroNIDAZOLE (FLAGYL) IVPB 500 mg  Status:  Discontinued     500 mg 100 mL/hr over 60 Minutes Intravenous Every 8 hours 08/14/18 2211 08/17/18 0908   08/14/18 1945  ciprofloxacin (CIPRO) IVPB 400 mg     400 mg 200 mL/hr over 60 Minutes Intravenous  Once 08/14/18 1934 08/14/18 2105   08/14/18 1945  metroNIDAZOLE (FLAGYL) IVPB 500 mg     500 mg 100 mL/hr over 60 Minutes Intravenous  Once 08/14/18 1934 08/14/18 2243       Assessment/Plan T2N1 stage III rectal cancer -APR 2011/chemotherapy Parastomal hernia repair 2011 status post appendectomy, cholecystectomy, multiple ureteral stent placements, bladder tumor resection 01/2018  AKI -resolved Elevated troponins  Hypertension Hypothyroid History of vascular dementia -on Exelon/Seroquel Hx of tobacco use AAA 5.1 cm  Nausea, vomiting and diarrhea SBO versus ileus (more likely ileus) E. coli UTI/enterotoxic E. Coli - positive GI panel             - Xray 9/4 showed contrast in the right colon with ongoing bowel dilatation             - minimal output after enema yesterday  - nausea/vomiting resolved  FEN:IV fluids, CLD ID: Cipro 8/29>>08/19/18; Flagyl 8/29>>9/4 DVT: Lovenox Foley: Wick in place Follow-up: TBD  Plan: Advance to clear liquids today. Encourage more OOB/mobilization. Monitor electrolytes. Will start colace BID; recommend that Ms. Ferrebee stay on a bowel regimen.   LOS: 7 days    Wellington Hampshire , [facility]  Surgery 08/22/2018, 9:31 AM Pager: [phone] Consults: [phone]  Agree with above.  Abdomen benign.  She is not getting out of bed enough.  Alphonsa Overall, MD, [facility] Surgery Pager: [phone] Office phone:  [phone]

## 2018-08-23 LAB — BASIC METABOLIC PANEL
ANION GAP: 4 — AB (ref 5–15)
BUN: 9 mg/dL (ref 8–23)
CALCIUM: 9.2 mg/dL (ref 8.9–10.3)
CO2: 27 mmol/L (ref 22–32)
Chloride: 105 mmol/L (ref 98–111)
Creatinine, Ser: 0.48 mg/dL (ref 0.44–1.00)
Glucose, Bld: 129 mg/dL — ABNORMAL HIGH (ref 70–99)
Potassium: 4.3 mmol/L (ref 3.5–5.1)
SODIUM: 136 mmol/L (ref 135–145)

## 2018-08-23 LAB — CBC
HCT: 37.5 % (ref 36.0–46.0)
HEMOGLOBIN: 12.2 g/dL (ref 12.0–15.0)
MCH: 29.8 pg (ref 26.0–34.0)
MCHC: 32.5 g/dL (ref 30.0–36.0)
MCV: 91.5 fL (ref 78.0–100.0)
PLATELETS: 235 10*3/uL (ref 150–400)
RBC: 4.1 MIL/uL (ref 3.87–5.11)
RDW: 16.2 % — AB (ref 11.5–15.5)
WBC: 8.3 10*3/uL (ref 4.0–10.5)

## 2018-08-23 LAB — MAGNESIUM: Magnesium: 2 mg/dL (ref 1.7–2.4)

## 2018-08-23 MED ORDER — ENSURE ENLIVE PO LIQD
237.0000 mL | Freq: Two times a day (BID) | ORAL | Status: DC
  Administered 2018-08-25 – 2018-08-26 (×3): 237 mL via ORAL

## 2018-08-23 NOTE — Progress Notes (Signed)
Patient out of bed to chair today for 90 min. Able to stand and pivot using front wheel walker. C/o increased abdominal discomfort and some nausea with moving. Belched a lot after she stood. Denied need for nausea med at this time.

## 2018-08-23 NOTE — Progress Notes (Signed)
Progress Note: General Surgery Service   Assessment/Plan: Active Problems:   Hypothyroidism   Essential hypertension   Diarrhea   Pressure injury of skin   Possible acute lower UTI   AKI (acute kidney injury) ([place])   Protein-calorie malnutrition, severe   Infectious colitis, enteritis and gastroenteritis   Ileus ([place])   Colitis   Dehydration  Ileus with ostomy, 649ml output yesterday, no vomiting -advance to fulls with protein supplement -continue to monitor    LOS: 8 days  Chief Complaint/Subjective: Occasional pains, minimal appetite, does not feel bloated  Objective: Vital signs in last 24 hours: Temp:  [97.5 F (36.4 C)-98.1 F (36.7 C)] 97.7 F (36.5 C) (09/07 0504) Pulse Rate:  [77-95] 95 (09/07 0504) Resp:  [16-30] 20 (09/07 0504) BP: (115-180)/(78-106) 128/90 (09/07 0504) SpO2:  [94 %-98 %] 96 % (09/07 0504) Weight:  [51.7 kg] 51.7 kg (09/07 0504) Last BM Date: 08/21/18  Intake/Output from previous day: 09/06 0701 - 09/07 0700 In: 2000.7 [P.O.:240; I.V.:1760.7] Out: 2100 [Urine:1500; Stool:600] Intake/Output this shift: No intake/output data recorded.  Lungs: CTAB  Cardiovascular: RRR  Abd: soft, ostomy with liquid output in bag, nondistended  Extremities: no edema  Neuro: AOx4  Lab Results: CBC  Recent Labs    08/22/18 0357 08/23/18 0433  WBC 7.0 8.3  HGB 11.5* 12.2  HCT 34.8* 37.5  PLT 214 235   BMET Recent Labs    08/22/18 0357 08/23/18 0433  NA 137 136  K 3.9 4.3  CL 107 105  CO2 27 27  GLUCOSE 114* 129*  BUN 10 9  CREATININE 0.56 0.48  CALCIUM 8.7* 9.2   PT/INR No results for input(s): LABPROT, INR in the last 72 hours. ABG No results for input(s): PHART, HCO3 in the last 72 hours.  Invalid input(s): PCO2, PO2  Studies/Results:  Anti-infectives: Anti-infectives (From admission, onward)   Start     Dose/Rate Route Frequency Ordered Stop   08/18/18 2000  ciprofloxacin (CIPRO) IVPB 400 mg  Status:  Discontinued      400 mg 200 mL/hr over 60 Minutes Intravenous Every 12 hours 08/18/18 1113 08/19/18 1308   08/18/18 1400  metroNIDAZOLE (FLAGYL) IVPB 500 mg  Status:  Discontinued     500 mg 100 mL/hr over 60 Minutes Intravenous Every 8 hours 08/18/18 1113 08/20/18 1338   08/17/18 1400  metroNIDAZOLE (FLAGYL) tablet 500 mg  Status:  Discontinued     500 mg Oral Every 8 hours 08/17/18 0908 08/18/18 1113   08/17/18 1000  ciprofloxacin (CIPRO) tablet 500 mg  Status:  Discontinued     500 mg Oral 2 times daily 08/17/18 0822 08/18/18 1113   08/17/18 0830  cephALEXin (KEFLEX) capsule 500 mg  Status:  Discontinued     500 mg Oral Every 8 hours 08/17/18 0821 08/17/18 0822   08/16/18 0000  ciprofloxacin (CIPRO) 500 MG tablet     500 mg Oral 2 times daily 08/16/18 0929 08/20/18 2359   08/15/18 2000  ciprofloxacin (CIPRO) IVPB 400 mg  Status:  Discontinued     400 mg 200 mL/hr over 60 Minutes Intravenous Every 24 hours 08/14/18 2239 08/17/18 0821   08/15/18 0600  metroNIDAZOLE (FLAGYL) IVPB 500 mg  Status:  Discontinued     500 mg 100 mL/hr over 60 Minutes Intravenous Every 8 hours 08/14/18 2211 08/17/18 0908   08/14/18 1945  ciprofloxacin (CIPRO) IVPB 400 mg     400 mg 200 mL/hr over 60 Minutes Intravenous  Once 08/14/18 1934 08/14/18 2105  08/14/18 1945  metroNIDAZOLE (FLAGYL) IVPB 500 mg     500 mg 100 mL/hr over 60 Minutes Intravenous  Once 08/14/18 1934 08/14/18 2243      Medications: Scheduled Meds: . darifenacin  7.5 mg Oral Daily  . docusate sodium  100 mg Oral BID  . enoxaparin (LOVENOX) injection  30 mg Subcutaneous Q24H  . feeding supplement (ENSURE ENLIVE)  237 mL Oral BID BM  . levothyroxine  50 mcg Intravenous Daily  . metoCLOPramide (REGLAN) injection  2.5 mg Intravenous Q8H  . metoprolol succinate  12.5 mg Oral QPM  . QUEtiapine  25 mg Oral QHS   Continuous Infusions: . sodium chloride 100 mL/hr at 08/19/18 0515  . dextrose 5 % and 0.45 % NaCl with KCl 20 mEq/L 75 mL/hr at 08/23/18  0700   PRN Meds:.acetaminophen **OR** acetaminophen, albuterol, hydrALAZINE, ondansetron (ZOFRAN) IV  Mickeal Skinner, MD Pg# [phone] [facility]

## 2018-08-23 NOTE — Progress Notes (Signed)
Patient refused breakfast/ P.O.'s this morning due to nausea. PRN med for nausea given. Also noted is mild confusion/ hallucination (thought her dog was in the room).

## 2018-08-23 NOTE — Progress Notes (Addendum)
Patient has declined meal trays and P.O.'s today. No output or gas in colostomy bag.

## 2018-08-23 NOTE — Progress Notes (Signed)
PROGRESS NOTE    Sherry [name]  NGE:[id number] DOB: 18-May-1923 DOA: 08/14/2018 PCP: Marletta Lor, MD   Brief Narrative:  HPI on 08/14/2018 by Dr. [name] Sherry [name]  is a 84 y.o. female, w hypertension, hyperlipidemia, CAD, hypothyroidism, Copd, [name], has c/o diarrhea (mild), for the past several days as well as nuasea.  Pt denies fever, chills, abd pain, emesis, constipation, brbpr, black stool, dysuria, hematuria.  Interim history Patient was admitted with diarrhea as well as acute kidney injury.  Now having nausea with what looks like a bowel obstruction versus ileus.  General surgery consulted and appreciated.  Assessment & Plan   Partial SBO versus ileus with nausea -developed nausea and vomiting on 08/19/2018 -KUB was done showing gas air-filled loops in the small bowel -General surgery consulted and appreciated as patient continues to have limited bowel sounds on exam -Abdominal x-ray showed persistent small bowel dilatation contrast has progressed into small bowel and right colon consistent with partial small bowel obstruction -Discussed with general surgery, recommended enema per ostomy. Continue low dose reglan  -Encouraged patient to get out of bed and mobilize. -Appears to be improving -tolerated clear liquid diet, advanced to full liquid by general surgery  Diarrhea  -resolved -GI pathogen panel: Enterotoxigenic E. coli -Completed cipro and flagyl  Acute kidney injury -Resolved, creatinine currently 0.48  Urinary tract infection -UA showed cloudy appearance, large leukocytes, positive nitrites, few bacteria, >50 WBC -Urine culture: 80KEnterococcus faecalis, >100k E. Coli -Completed antibiotics  Hypomagnesemia -resolved with replacement, currently 2 -continue to monitor  Hypothyroidism -Continue IV Synthroid given the patient is not taking her oral medications  Elevated troponin -Has remained flat -Denies chest pain or shortness of breath -EKG  showed nonspecific ST segment changes -Echocardiogram EF 55 to 60%, no regional wall motion abnormalities.  Findings consistent with left ventricular diastolic dysfunction, intermediate for quantification of dysfunction grade  Skin pressure injury -Continue wound care and turn patient every 2 hours -Sacrum, stage II (?POA- not mentioned on H&P)  Vascular dementia -Continue Exelon and Seroquel at bedtime  Essential hypertension -Currently on metoprolol and PRN hydralazine -BP more controlled  Severe malnutrition -Nutrition consulted, continue Ensure  Rectal cancer, stage III/tumor  DVT Prophylaxis  lovenox  Code Status: Full  Family Communication: none at bedside  Disposition Plan: Admitted, pending improvement in bowel obstruction/ ileus   Consultants General surgery  Procedures  None  Antibiotics   Anti-infectives (From admission, onward)   Start     Dose/Rate Route Frequency Ordered Stop   08/18/18 2000  ciprofloxacin (CIPRO) IVPB 400 mg  Status:  Discontinued     400 mg 200 mL/hr over 60 Minutes Intravenous Every 12 hours 08/18/18 1113 08/19/18 1308   08/18/18 1400  metroNIDAZOLE (FLAGYL) IVPB 500 mg  Status:  Discontinued     500 mg 100 mL/hr over 60 Minutes Intravenous Every 8 hours 08/18/18 1113 08/20/18 1338   08/17/18 1400  metroNIDAZOLE (FLAGYL) tablet 500 mg  Status:  Discontinued     500 mg Oral Every 8 hours 08/17/18 0908 08/18/18 1113   08/17/18 1000  ciprofloxacin (CIPRO) tablet 500 mg  Status:  Discontinued     500 mg Oral 2 times daily 08/17/18 0822 08/18/18 1113   08/17/18 0830  cephALEXin (KEFLEX) capsule 500 mg  Status:  Discontinued     500 mg Oral Every 8 hours 08/17/18 0821 08/17/18 0822   08/16/18 0000  ciprofloxacin (CIPRO) 500 MG tablet     500 mg Oral 2  times daily 08/16/18 0929 08/20/18 2359   08/15/18 2000  ciprofloxacin (CIPRO) IVPB 400 mg  Status:  Discontinued     400 mg 200 mL/hr over 60 Minutes Intravenous Every 24 hours 08/14/18  2239 08/17/18 0821   08/15/18 0600  metroNIDAZOLE (FLAGYL) IVPB 500 mg  Status:  Discontinued     500 mg 100 mL/hr over 60 Minutes Intravenous Every 8 hours 08/14/18 2211 08/17/18 0908   08/14/18 1945  ciprofloxacin (CIPRO) IVPB 400 mg     400 mg 200 mL/hr over 60 Minutes Intravenous  Once 08/14/18 1934 08/14/18 2105   08/14/18 1945  metroNIDAZOLE (FLAGYL) IVPB 500 mg     500 mg 100 mL/hr over 60 Minutes Intravenous  Once 08/14/18 1934 08/14/18 2243      Subjective:   Sherry [name] seen and examined today.  Denies further nausea or vomiting.  Denies current chest pain, shortness of breath, abdominal pain, dizziness or headache.  States that she is seeing her dog and is afraid that he may leave the room.  Objective:   Vitals:   08/22/18 1338 08/22/18 2041 08/23/18 0140 08/23/18 0504  BP: (!) 145/83 (!) 180/106 115/78 128/90  Pulse: 77 87  95  Resp: (!) 30 20  20   Temp: (!) 97.5 F (36.4 C) 97.9 F (36.6 C)  97.7 F (36.5 C)  TempSrc: Oral Oral  Oral  SpO2: 98% 94%  96%  Weight:    51.7 kg  Height:        Intake/Output Summary (Last 24 hours) at 08/23/2018 1305 Last data filed at 08/23/2018 0700 Gross per 24 hour  Intake 1820.72 ml  Output 1200 ml  Net 620.72 ml   Filed Weights   08/20/18 0500 08/21/18 0537 08/23/18 0504  Weight: 51.3 kg 50.5 kg 51.7 kg   Exam  General: Well developed, well nourished, NAD, appears stated age  86: NCAT, mucous membranes moist.   Neck: Supple  Cardiovascular: S1 S2 auscultated, murmurs, RRR  Respiratory: Clear to auscultation bilaterally with equal chest rise  Abdomen: Soft, nontender, nondistended, + bowel sounds  Extremities: warm dry without cyanosis clubbing or edema  Neuro: AAOx3, nonfocal (has dementia, seeing her dog)  Psych: Appropriate mood and affect  Data Reviewed: I have personally reviewed following labs and imaging studies  CBC: Recent Labs  Lab 08/19/18 0450 08/21/18 0443 08/22/18 0357 08/23/18 0433    WBC 10.7* 7.4 7.0 8.3  HGB 11.8* 11.5* 11.5* 12.2  HCT 36.7 34.9* 34.8* 37.5  MCV 91.8 90.4 90.4 91.5  PLT 207 220 214 329   Basic Metabolic Panel: Recent Labs  Lab 08/19/18 0450 08/20/18 0448 08/21/18 0443 08/22/18 0357 08/23/18 0433  NA 139 140 139 137 136  K 3.5 3.6 3.7 3.9 4.3  CL 111 111 110 107 105  CO2 19* 24 25 27 27   GLUCOSE 83 160* 107* 114* 129*  BUN 11 10 10 10 9   CREATININE 0.72 0.69 0.63 0.56 0.48  CALCIUM 8.7* 9.2 9.0 8.7* 9.2  MG  --   --  1.6* 1.9 2.0   GFR: Estimated Creatinine Clearance: 36.6 mL/min (by C-G formula based on SCr of 0.48 mg/dL). Liver Function Tests: No results for input(s): AST, ALT, ALKPHOS, BILITOT, PROT, ALBUMIN in the last 168 hours. No results for input(s): LIPASE, AMYLASE in the last 168 hours. No results for input(s): AMMONIA in the last 168 hours. Coagulation Profile: No results for input(s): INR, PROTIME in the last 168 hours. Cardiac Enzymes: No results for input(s):  CKTOTAL, CKMB, CKMBINDEX, TROPONINI in the last 168 hours. BNP (last 3 results) No results for input(s): PROBNP in the last 8760 hours. HbA1C: No results for input(s): HGBA1C in the last 72 hours. CBG: No results for input(s): GLUCAP in the last 168 hours. Lipid Profile: No results for input(s): CHOL, HDL, LDLCALC, TRIG, CHOLHDL, LDLDIRECT in the last 72 hours. Thyroid Function Tests: No results for input(s): TSH, T4TOTAL, FREET4, T3FREE, THYROIDAB in the last 72 hours. Anemia Panel: No results for input(s): VITAMINB12, FOLATE, FERRITIN, TIBC, IRON, RETICCTPCT in the last 72 hours. Urine analysis:    Component Value Date/Time   COLORURINE AMBER (A) 08/14/2018 1944   APPEARANCEUR CLOUDY (A) 08/14/2018 1944   LABSPEC 1.018 08/14/2018 1944   LABSPEC 1.030 12/06/2009 1628   PHURINE 5.0 08/14/2018 1944   GLUCOSEU NEGATIVE 08/14/2018 1944   HGBUR MODERATE (A) 08/14/2018 1944   BILIRUBINUR NEGATIVE 08/14/2018 1944   BILIRUBINUR 1+ 02/10/2016 1638    BILIRUBINUR Negative 12/06/2009 1628   KETONESUR 20 (A) 08/14/2018 1944   PROTEINUR 100 (A) 08/14/2018 1944   UROBILINOGEN 1.0 02/10/2016 1638   UROBILINOGEN 1.0 04/01/2010 1500   NITRITE POSITIVE (A) 08/14/2018 1944   LEUKOCYTESUR LARGE (A) 08/14/2018 1944   LEUKOCYTESUR Small 12/06/2009 1628   Sepsis Labs: @LABRCNTIP (procalcitonin:4,lacticidven:4)  ) Recent Results (from the past 240 hour(s))  Urine culture     Status: Abnormal   Collection Time: 08/14/18  7:44 PM  Result Value Ref Range Status   Specimen Description   Final    URINE, CLEAN CATCH Performed at [facility], [place] [address], [place], [place] [address]    Special Requests   Final    NONE Performed at [facility], [place] [address], [place], [place] [address]    Culture (A)  Final    >=100,000 COLONIES/mL ESCHERICHIA COLI 80,000 COLONIES/mL ENTEROCOCCUS FAECALIS    Report Status 08/17/2018 FINAL  Final   Organism ID, Bacteria ESCHERICHIA COLI (A)  Final   Organism ID, Bacteria ENTEROCOCCUS FAECALIS (A)  Final      Susceptibility   Escherichia coli - MIC*    AMPICILLIN <=2 SENSITIVE Sensitive     CEFAZOLIN <=4 SENSITIVE Sensitive     CEFTRIAXONE 8 SENSITIVE Sensitive     CIPROFLOXACIN <=0.25 SENSITIVE Sensitive     GENTAMICIN <=1 SENSITIVE Sensitive     IMIPENEM 0.5 SENSITIVE Sensitive     NITROFURANTOIN <=16 SENSITIVE Sensitive     TRIMETH/SULFA <=20 SENSITIVE Sensitive     AMPICILLIN/SULBACTAM <=2 SENSITIVE Sensitive     PIP/TAZO <=4 SENSITIVE Sensitive     Extended ESBL NEGATIVE Sensitive     * >=100,000 COLONIES/mL ESCHERICHIA COLI   Enterococcus faecalis - MIC*    AMPICILLIN <=2 SENSITIVE Sensitive     LEVOFLOXACIN 0.5 SENSITIVE Sensitive     NITROFURANTOIN <=16 SENSITIVE Sensitive     VANCOMYCIN 1 SENSITIVE Sensitive     * 80,000 COLONIES/mL ENTEROCOCCUS FAECALIS  Gastrointestinal Panel by PCR , Stool     Status: Abnormal   Collection Time: 08/15/18  2:30 PM    Result Value Ref Range Status   Campylobacter species NOT DETECTED NOT DETECTED Final   Plesimonas shigelloides NOT DETECTED NOT DETECTED Final   Salmonella species NOT DETECTED NOT DETECTED Final   Yersinia enterocolitica NOT DETECTED NOT DETECTED Final   Vibrio species NOT DETECTED NOT DETECTED Final   Vibrio cholerae NOT DETECTED NOT DETECTED Final   Enteroaggregative E coli (EAEC) NOT DETECTED NOT DETECTED Final   Enteropathogenic  E coli (EPEC) NOT DETECTED NOT DETECTED Final   Enterotoxigenic E coli (ETEC) DETECTED (A) NOT DETECTED Final    Comment: RESULT CALLED TO, READ BACK BY AND VERIFIED WITH: ASHLEY BROWN 08/16/18 @ 31  [place] like toxin producing E coli (STEC) NOT DETECTED NOT DETECTED Final   Shigella/Enteroinvasive E coli (EIEC) NOT DETECTED NOT DETECTED Final   Cryptosporidium NOT DETECTED NOT DETECTED Final   Cyclospora cayetanensis NOT DETECTED NOT DETECTED Final   Entamoeba histolytica NOT DETECTED NOT DETECTED Final   Giardia lamblia NOT DETECTED NOT DETECTED Final   Adenovirus F40/41 NOT DETECTED NOT DETECTED Final   Astrovirus NOT DETECTED NOT DETECTED Final   Norovirus GI/GII NOT DETECTED NOT DETECTED Final   Rotavirus A NOT DETECTED NOT DETECTED Final   Sapovirus (I, II, IV, and V) NOT DETECTED NOT DETECTED Final    Comment: Performed at [facility], [address], [place],  [address]      Radiology Studies: No results found.   Scheduled Meds: . darifenacin  7.5 mg Oral Daily  . docusate sodium  100 mg Oral BID  . enoxaparin (LOVENOX) injection  30 mg Subcutaneous Q24H  . feeding supplement (ENSURE ENLIVE)  237 mL Oral BID BM  . levothyroxine  50 mcg Intravenous Daily  . metoCLOPramide (REGLAN) injection  2.5 mg Intravenous Q8H  . metoprolol succinate  12.5 mg Oral QPM  . QUEtiapine  25 mg Oral QHS   Continuous Infusions: . sodium chloride 100 mL/hr at 08/23/18 0918  . dextrose 5 % and 0.45 % NaCl with KCl 20 mEq/L 75  mL/hr at 08/23/18 0955     LOS: 8 days   Time Spent in minutes   30 minutes  Tirso Laws D.O. on 08/23/2018 at 1:05 PM  Between 7am to 7pm - Please see pager noted on [email or web address]  After 7pm go to [email or web address]  And look for the night coverage person covering for me after hours  [facility] Office  [phone]

## 2018-08-24 ENCOUNTER — Inpatient Hospital Stay (HOSPITAL_COMMUNITY): Payer: Medicare Other

## 2018-08-24 NOTE — Progress Notes (Signed)
PROGRESS NOTE    IYLA BALZARINI  BTD:[id number] DOB: August 01, 1923 DOA: 08/14/2018 PCP: Marletta Lor, MD   Brief Narrative:  HPI on 08/14/2018 by Dr. [name] Sherry [name]  is a 84 y.o. female, w hypertension, hyperlipidemia, CAD, hypothyroidism, Copd, [name], has c/o diarrhea (mild), for the past several days as well as nuasea.  Pt denies fever, chills, abd pain, emesis, constipation, brbpr, black stool, dysuria, hematuria.  Interim history Patient was admitted with diarrhea as well as acute kidney injury.  Now having nausea with what looks like a bowel obstruction versus ileus.  General surgery consulted and appreciated.  Assessment & Plan   Partial SBO versus ileus with nausea -developed nausea and vomiting on 08/19/2018 -KUB was done showing gas air-filled loops in the small bowel -General surgery consulted and appreciated as patient continues to have limited bowel sounds on exam -Abdominal x-ray showed persistent small bowel dilatation contrast has progressed into small bowel and right colon consistent with partial small bowel obstruction -Discussed with general surgery, recommended enema per ostomy. Continue low dose reglan  -Encouraged patient to get out of bed and mobilize. -Appears to be improving -Continue full liquid diet -noted to have air/gas in ostomy bag -discussed with Dr. Kieth Brightly, surgery, recommended repeating abdominal xray today -Abdominal x-ray reviewed and shows persistent small bowel obstruction  Diarrhea  -resolved -GI pathogen panel: Enterotoxigenic E. coli -Completed cipro and flagyl  Acute kidney injury -Resolved, creatinine currently 0.48  Urinary tract infection -UA showed cloudy appearance, large leukocytes, positive nitrites, few bacteria, >50 WBC -Urine culture: 80KEnterococcus faecalis, >100k E. Coli -Completed antibiotics  Hypomagnesemia -resolved with replacement, currently 2 -continue to monitor  Hypothyroidism -Continue IV  Synthroid given the patient is not taking her oral medications  Elevated troponin -Has remained flat -Denies chest pain or shortness of breath -EKG showed nonspecific ST segment changes -Echocardiogram EF 55 to 60%, no regional wall motion abnormalities.  Findings consistent with left ventricular diastolic dysfunction, intermediate for quantification of dysfunction grade  Skin pressure injury -Continue wound care and turn patient every 2 hours -Sacrum, stage II (?POA- not mentioned on H&P)  Vascular dementia -Continue Exelon and Seroquel at bedtime  Essential hypertension -Currently on metoprolol and PRN hydralazine  Severe malnutrition -Nutrition consulted, continue Ensure  Rectal cancer, stage III/tumor  DVT Prophylaxis  lovenox  Code Status: Full  Family Communication: none at bedside  Disposition Plan: Admitted, pending improvement in bowel obstruction/ ileus- pending further recommendations from general surgery  Consultants General surgery  Procedures  None  Antibiotics   Anti-infectives (From admission, onward)   Start     Dose/Rate Route Frequency Ordered Stop   08/18/18 2000  ciprofloxacin (CIPRO) IVPB 400 mg  Status:  Discontinued     400 mg 200 mL/hr over 60 Minutes Intravenous Every 12 hours 08/18/18 1113 08/19/18 1308   08/18/18 1400  metroNIDAZOLE (FLAGYL) IVPB 500 mg  Status:  Discontinued     500 mg 100 mL/hr over 60 Minutes Intravenous Every 8 hours 08/18/18 1113 08/20/18 1338   08/17/18 1400  metroNIDAZOLE (FLAGYL) tablet 500 mg  Status:  Discontinued     500 mg Oral Every 8 hours 08/17/18 0908 08/18/18 1113   08/17/18 1000  ciprofloxacin (CIPRO) tablet 500 mg  Status:  Discontinued     500 mg Oral 2 times daily 08/17/18 0822 08/18/18 1113   08/17/18 0830  cephALEXin (KEFLEX) capsule 500 mg  Status:  Discontinued     500 mg Oral Every 8 hours 08/17/18  9562 08/17/18 0822   08/16/18 0000  ciprofloxacin (CIPRO) 500 MG tablet     500 mg Oral 2 times  daily 08/16/18 0929 08/20/18 2359   08/15/18 2000  ciprofloxacin (CIPRO) IVPB 400 mg  Status:  Discontinued     400 mg 200 mL/hr over 60 Minutes Intravenous Every 24 hours 08/14/18 2239 08/17/18 0821   08/15/18 0600  metroNIDAZOLE (FLAGYL) IVPB 500 mg  Status:  Discontinued     500 mg 100 mL/hr over 60 Minutes Intravenous Every 8 hours 08/14/18 2211 08/17/18 0908   08/14/18 1945  ciprofloxacin (CIPRO) IVPB 400 mg     400 mg 200 mL/hr over 60 Minutes Intravenous  Once 08/14/18 1934 08/14/18 2105   08/14/18 1945  metroNIDAZOLE (FLAGYL) IVPB 500 mg     500 mg 100 mL/hr over 60 Minutes Intravenous  Once 08/14/18 1934 08/14/18 2243      Subjective:   [name] seen and examined today.  Denies nausea or vomiting, abdominal pain.  States she did not eat very much yesterday.  Denies current chest pain, shortness of breath, dizziness or headache.  Objective:   Vitals:   08/23/18 2229 08/24/18 0634 08/24/18 1315 08/24/18 1320  BP: (!) 172/99 138/85 (!) 152/110 (!) 140/106  Pulse: 98 (!) 103 100   Resp: 16 18 16    Temp: 98.2 F (36.8 C) 97.9 F (36.6 C) 97.7 F (36.5 C)   TempSrc: Oral Oral Axillary   SpO2: 96% 95% 94%   Weight:  53.1 kg    Height:        Intake/Output Summary (Last 24 hours) at 08/24/2018 1332 Last data filed at 08/24/2018 1100 Gross per 24 hour  Intake 4229.23 ml  Output 825 ml  Net 3404.23 ml   Filed Weights   08/21/18 0537 08/23/18 0504 08/24/18 0634  Weight: 50.5 kg 51.7 kg 53.1 kg   Exam  General: Well developed, well nourished, NAD, appears stated age  10: NCAT, mucous membranes moist.   Neck: Supple  Cardiovascular: S1 S2 auscultated, no murmur, RRR  Respiratory: Clear to auscultation bilaterally with equal chest rise  Abdomen: Soft, nontender, nondistended, + bowel sounds  Extremities: warm dry without cyanosis clubbing or edema  Neuro: AAOx3, nonfocal  Psych: does not, appropriate mood and affect  Data Reviewed: I have personally  reviewed following labs and imaging studies  CBC: Recent Labs  Lab 08/19/18 0450 08/21/18 0443 08/22/18 0357 08/23/18 0433  WBC 10.7* 7.4 7.0 8.3  HGB 11.8* 11.5* 11.5* 12.2  HCT 36.7 34.9* 34.8* 37.5  MCV 91.8 90.4 90.4 91.5  PLT 207 220 214 130   Basic Metabolic Panel: Recent Labs  Lab 08/19/18 0450 08/20/18 0448 08/21/18 0443 08/22/18 0357 08/23/18 0433  NA 139 140 139 137 136  K 3.5 3.6 3.7 3.9 4.3  CL 111 111 110 107 105  CO2 19* 24 25 27 27   GLUCOSE 83 160* 107* 114* 129*  BUN 11 10 10 10 9   CREATININE 0.72 0.69 0.63 0.56 0.48  CALCIUM 8.7* 9.2 9.0 8.7* 9.2  MG  --   --  1.6* 1.9 2.0   GFR: Estimated Creatinine Clearance: 37.1 mL/min (by C-G formula based on SCr of 0.48 mg/dL). Liver Function Tests: No results for input(s): AST, ALT, ALKPHOS, BILITOT, PROT, ALBUMIN in the last 168 hours. No results for input(s): LIPASE, AMYLASE in the last 168 hours. No results for input(s): AMMONIA in the last 168 hours. Coagulation Profile: No results for input(s): INR, PROTIME in the  last 168 hours. Cardiac Enzymes: No results for input(s): CKTOTAL, CKMB, CKMBINDEX, TROPONINI in the last 168 hours. BNP (last 3 results) No results for input(s): PROBNP in the last 8760 hours. HbA1C: No results for input(s): HGBA1C in the last 72 hours. CBG: No results for input(s): GLUCAP in the last 168 hours. Lipid Profile: No results for input(s): CHOL, HDL, LDLCALC, TRIG, CHOLHDL, LDLDIRECT in the last 72 hours. Thyroid Function Tests: No results for input(s): TSH, T4TOTAL, FREET4, T3FREE, THYROIDAB in the last 72 hours. Anemia Panel: No results for input(s): VITAMINB12, FOLATE, FERRITIN, TIBC, IRON, RETICCTPCT in the last 72 hours. Urine analysis:    Component Value Date/Time   COLORURINE AMBER (A) 08/14/2018 1944   APPEARANCEUR CLOUDY (A) 08/14/2018 1944   LABSPEC 1.018 08/14/2018 1944   LABSPEC 1.030 12/06/2009 1628   PHURINE 5.0 08/14/2018 1944   GLUCOSEU NEGATIVE  08/14/2018 1944   HGBUR MODERATE (A) 08/14/2018 1944   BILIRUBINUR NEGATIVE 08/14/2018 1944   BILIRUBINUR 1+ 02/10/2016 1638   BILIRUBINUR Negative 12/06/2009 1628   KETONESUR 20 (A) 08/14/2018 1944   PROTEINUR 100 (A) 08/14/2018 1944   UROBILINOGEN 1.0 02/10/2016 1638   UROBILINOGEN 1.0 04/01/2010 1500   NITRITE POSITIVE (A) 08/14/2018 1944   LEUKOCYTESUR LARGE (A) 08/14/2018 1944   LEUKOCYTESUR Small 12/06/2009 1628   Sepsis Labs: @LABRCNTIP (procalcitonin:4,lacticidven:4)  ) Recent Results (from the past 240 hour(s))  Urine culture     Status: Abnormal   Collection Time: 08/14/18  7:44 PM  Result Value Ref Range Status   Specimen Description   Final    URINE, CLEAN CATCH Performed at [facility], [place] [address], [place], [place] [address]    Special Requests   Final    NONE Performed at [facility], [place] [address] [place], [place] [address]    Culture (A)  Final    >=100,000 COLONIES/mL ESCHERICHIA COLI 80,000 COLONIES/mL ENTEROCOCCUS FAECALIS    Report Status 08/17/2018 FINAL  Final   Organism ID, Bacteria ESCHERICHIA COLI (A)  Final   Organism ID, Bacteria ENTEROCOCCUS FAECALIS (A)  Final      Susceptibility   Escherichia coli - MIC*    AMPICILLIN <=2 SENSITIVE Sensitive     CEFAZOLIN <=4 SENSITIVE Sensitive     CEFTRIAXONE 8 SENSITIVE Sensitive     CIPROFLOXACIN <=0.25 SENSITIVE Sensitive     GENTAMICIN <=1 SENSITIVE Sensitive     IMIPENEM 0.5 SENSITIVE Sensitive     NITROFURANTOIN <=16 SENSITIVE Sensitive     TRIMETH/SULFA <=20 SENSITIVE Sensitive     AMPICILLIN/SULBACTAM <=2 SENSITIVE Sensitive     PIP/TAZO <=4 SENSITIVE Sensitive     Extended ESBL NEGATIVE Sensitive     * >=100,000 COLONIES/mL ESCHERICHIA COLI   Enterococcus faecalis - MIC*    AMPICILLIN <=2 SENSITIVE Sensitive     LEVOFLOXACIN 0.5 SENSITIVE Sensitive     NITROFURANTOIN <=16 SENSITIVE Sensitive     VANCOMYCIN 1 SENSITIVE Sensitive     * 80,000  COLONIES/mL ENTEROCOCCUS FAECALIS  Gastrointestinal Panel by PCR , Stool     Status: Abnormal   Collection Time: 08/15/18  2:30 PM  Result Value Ref Range Status   Campylobacter species NOT DETECTED NOT DETECTED Final   Plesimonas shigelloides NOT DETECTED NOT DETECTED Final   Salmonella species NOT DETECTED NOT DETECTED Final   Yersinia enterocolitica NOT DETECTED NOT DETECTED Final   Vibrio species NOT DETECTED NOT DETECTED Final   Vibrio cholerae NOT DETECTED NOT DETECTED Final   Enteroaggregative E coli (EAEC)  NOT DETECTED NOT DETECTED Final   Enteropathogenic E coli (EPEC) NOT DETECTED NOT DETECTED Final   Enterotoxigenic E coli (ETEC) DETECTED (A) NOT DETECTED Final    Comment: RESULT CALLED TO, READ BACK BY AND VERIFIED WITH: ASHLEY BROWN 08/16/18 @ 63  [place] like toxin producing E coli (STEC) NOT DETECTED NOT DETECTED Final   Shigella/Enteroinvasive E coli (EIEC) NOT DETECTED NOT DETECTED Final   Cryptosporidium NOT DETECTED NOT DETECTED Final   Cyclospora cayetanensis NOT DETECTED NOT DETECTED Final   Entamoeba histolytica NOT DETECTED NOT DETECTED Final   Giardia lamblia NOT DETECTED NOT DETECTED Final   Adenovirus F40/41 NOT DETECTED NOT DETECTED Final   Astrovirus NOT DETECTED NOT DETECTED Final   Norovirus GI/GII NOT DETECTED NOT DETECTED Final   Rotavirus A NOT DETECTED NOT DETECTED Final   Sapovirus (I, II, IV, and V) NOT DETECTED NOT DETECTED Final    Comment: Performed at [facility], [address], [place], [place] [address]      Radiology Studies: Dg Abd 1 View  Result Date: 08/24/2018 CLINICAL DATA:  Pain.  Obstruction. EXAM: ABDOMEN - 1 VIEW COMPARISON:  August 20, 2018 FINDINGS: A left ureteral stent remains. Diffuse mildly dilated loops of small bowel persist. The caliber of the small bowel loops is mildly improved. There is contrast in the right colon and proximal transverse colon. IMPRESSION: Persistent small bowel obstruction with mild  improvement in small bowel loops. Electronically Signed   By: Dorise Bullion III M.D   On: 08/24/2018 11:18     Scheduled Meds: . darifenacin  7.5 mg Oral Daily  . docusate sodium  100 mg Oral BID  . enoxaparin (LOVENOX) injection  30 mg Subcutaneous Q24H  . feeding supplement (ENSURE ENLIVE)  237 mL Oral BID BM  . levothyroxine  50 mcg Intravenous Daily  . metoCLOPramide (REGLAN) injection  2.5 mg Intravenous Q8H  . metoprolol succinate  12.5 mg Oral QPM  . QUEtiapine  25 mg Oral QHS   Continuous Infusions:    LOS: 9 days   Time Spent in minutes   30 minutes  Nikos Anglemyer D.O. on 08/24/2018 at 1:32 PM  Between 7am to 7pm - Please see pager noted on [email or web address]  After 7pm go to [email or web address]  And look for the night coverage person covering for me after hours  [facility] Office  [phone]

## 2018-08-24 NOTE — Progress Notes (Signed)
  Progress Note: General Surgery Service   Assessment/Plan: Active Problems:   Hypothyroidism   Essential hypertension   Diarrhea   Pressure injury of skin   Possible acute lower UTI   AKI (acute kidney injury) ([place])   Protein-calorie malnutrition, severe   Infectious colitis, enteritis and gastroenteritis   Ileus ([place])   Colitis   Dehydration  Minimal PO intake yesterday, ostomy with gas in bag -Xr today to examine progress   LOS: 9 days  Chief Complaint/Subjective: States she drank some soup, denies abdominal pain or nausea  Objective: Vital signs in last 24 hours: Temp:  [97.9 F (36.6 C)-98.2 F (36.8 C)] 97.9 F (36.6 C) (09/08 0634) Pulse Rate:  [93-103] 103 (09/08 0634) Resp:  [16-20] 18 (09/08 0634) BP: (138-172)/(83-99) 138/85 (09/08 0634) SpO2:  [95 %-96 %] 95 % (09/08 0634) Weight:  [53.1 kg] 53.1 kg (09/08 0634) Last BM Date: 08/21/18  Intake/Output from previous day: 09/07 0701 - 09/08 0700 In: 3757.4 [P.O.:160; I.V.:3597.4] Out: 500 [Urine:500] Intake/Output this shift: No intake/output data recorded.  Gen: NAD  Lungs: CTAB  Cardiovascular: tachycardic  Abd: soft, slight distension  Extremities: no edema  Neuro: answers questions appropriatley  Studies/Results:  n/a   Mickeal Skinner, MD [facility]

## 2018-08-24 NOTE — Plan of Care (Signed)
  Problem: Education: Goal: Knowledge of General Education information will improve Description: Including pain rating scale, medication(s)/side effects and non-pharmacologic comfort measures Outcome: Progressing   Problem: Skin Integrity: Goal: Risk for impaired skin integrity will decrease Outcome: Progressing   

## 2018-08-25 LAB — BASIC METABOLIC PANEL
ANION GAP: 4 — AB (ref 5–15)
BUN: 9 mg/dL (ref 8–23)
CALCIUM: 9.2 mg/dL (ref 8.9–10.3)
CHLORIDE: 107 mmol/L (ref 98–111)
CO2: 26 mmol/L (ref 22–32)
Creatinine, Ser: 0.54 mg/dL (ref 0.44–1.00)
GFR calc non Af Amer: >60 mL/min (ref >60)
Glucose, Bld: 91 mg/dL (ref 70–99)
POTASSIUM: 4.2 mmol/L (ref 3.5–5.1)
Sodium: 137 mmol/L (ref 135–145)

## 2018-08-25 LAB — CBC
HEMATOCRIT: 37.9 % (ref 36.0–46.0)
HEMOGLOBIN: 12.3 g/dL (ref 12.0–15.0)
MCH: 29.7 pg (ref 26.0–34.0)
MCHC: 32.5 g/dL (ref 30.0–36.0)
MCV: 91.5 fL (ref 78.0–100.0)
Platelets: 268 10*3/uL (ref 150–400)
RBC: 4.14 MIL/uL (ref 3.87–5.11)
RDW: 16.5 % — AB (ref 11.5–15.5)
WBC: 8.9 10*3/uL (ref 4.0–10.5)

## 2018-08-25 LAB — MAGNESIUM: Magnesium: 1.7 mg/dL (ref 1.7–2.4)

## 2018-08-25 MED ORDER — MAGNESIUM CITRATE PO SOLN
1.0000 | Freq: Once | ORAL | Status: AC
  Administered 2018-08-25: 1 via ORAL
  Filled 2018-08-25: qty 296

## 2018-08-25 MED ORDER — LEVOTHYROXINE SODIUM 100 MCG PO TABS
100.0000 ug | ORAL_TABLET | Freq: Every day | ORAL | Status: DC
  Administered 2018-08-26 – 2018-08-27 (×2): 100 ug via ORAL
  Filled 2018-08-25 (×2): qty 1

## 2018-08-25 NOTE — Care Management Important Message (Signed)
Important Message  Patient Details  Name: SKYELYNN RAMBEAU MRN: [id number] Date of Birth: 10-06-1923   Medicare Important Message Given:  Yes    Kerin Salen 08/25/2018, 12:22 [place] Message  Patient Details  Name: CASSANDRE OLEKSY MRN: [id number] Date of Birth: Feb 24, 1923   Medicare Important Message Given:  Yes    Kerin Salen 08/25/2018, 12:21 PM

## 2018-08-25 NOTE — Progress Notes (Signed)
PROGRESS NOTE    Sherry [name]  ZOX:[id number] DOB: September 23, 1923 DOA: 08/14/2018 PCP: Marletta Lor, MD   Brief Narrative:  HPI on 08/14/2018 by Dr. [name] Sherry [name]  is a 84 y.o. female, w hypertension, hyperlipidemia, CAD, hypothyroidism, Copd, [name], has c/o diarrhea (mild), for the past several days as well as nuasea.  Pt denies fever, chills, abd pain, emesis, constipation, brbpr, black stool, dysuria, hematuria.  Interim history Patient was admitted with diarrhea as well as acute kidney injury.  Now having nausea with what looks like a bowel obstruction versus ileus.  General surgery consulted and appreciated.  Assessment & Plan   Partial SBO versus ileus with nausea -developed nausea and vomiting on 08/19/2018 -KUB was done showing gas air-filled loops in the small bowel -General surgery consulted and appreciated as patient continues to have limited bowel sounds on exam -Abdominal x-ray showed persistent small bowel dilatation contrast has progressed into small bowel and right colon consistent with partial small bowel obstruction -Discussed with general surgery, recommended enema per ostomy. Continue low dose reglan  -Encouraged patient to get out of bed and mobilize. -Appears to be improving -Continue full liquid diet -noted to have air/gas in ostomy bag -Abdominal x-ray reviewed and shows persistent small bowel obstruction -General surgery ordered mag citrate  Diarrhea  -resolved -GI pathogen panel: Enterotoxigenic E. coli -Completed cipro and flagyl  Acute kidney injury -Resolved, creatinine currently 0.54  Urinary tract infection -UA showed cloudy appearance, large leukocytes, positive nitrites, few bacteria, >50 WBC -Urine culture: 80KEnterococcus faecalis, >100k E. Coli -Completed antibiotics  Hypomagnesemia -resolved with replacement, currently 1.7 -continue to monitor  Hypothyroidism -Continue IV Synthroid given the patient is not taking her oral  medications  Elevated troponin -Has remained flat -Denies chest pain or shortness of breath -EKG showed nonspecific ST segment changes -Echocardiogram EF 55 to 60%, no regional wall motion abnormalities.  Findings consistent with left ventricular diastolic dysfunction, intermediate for quantification of dysfunction grade  Skin pressure injury -Continue wound care and turn patient every 2 hours -Sacrum, stage II (?POA- not mentioned on H&P)  Vascular dementia -Continue Exelon and Seroquel at bedtime  Essential hypertension -Currently on metoprolol and PRN hydralazine  Severe malnutrition -Nutrition consulted, continue Ensure  Rectal cancer, stage III/tumor  DVT Prophylaxis  lovenox  Code Status: Full  Family Communication: none at bedside  Disposition Plan: Admitted, pending improvement in bowel obstruction/ ileus- pending further recommendations from general surgery  Consultants General surgery  Procedures  None  Antibiotics   Anti-infectives (From admission, onward)   Start     Dose/Rate Route Frequency Ordered Stop   08/18/18 2000  ciprofloxacin (CIPRO) IVPB 400 mg  Status:  Discontinued     400 mg 200 mL/hr over 60 Minutes Intravenous Every 12 hours 08/18/18 1113 08/19/18 1308   08/18/18 1400  metroNIDAZOLE (FLAGYL) IVPB 500 mg  Status:  Discontinued     500 mg 100 mL/hr over 60 Minutes Intravenous Every 8 hours 08/18/18 1113 08/20/18 1338   08/17/18 1400  metroNIDAZOLE (FLAGYL) tablet 500 mg  Status:  Discontinued     500 mg Oral Every 8 hours 08/17/18 0908 08/18/18 1113   08/17/18 1000  ciprofloxacin (CIPRO) tablet 500 mg  Status:  Discontinued     500 mg Oral 2 times daily 08/17/18 0822 08/18/18 1113   08/17/18 0830  cephALEXin (KEFLEX) capsule 500 mg  Status:  Discontinued     500 mg Oral Every 8 hours 08/17/18 0821 08/17/18 8119  08/16/18 0000  ciprofloxacin (CIPRO) 500 MG tablet     500 mg Oral 2 times daily 08/16/18 0929 08/20/18 2359   08/15/18 2000   ciprofloxacin (CIPRO) IVPB 400 mg  Status:  Discontinued     400 mg 200 mL/hr over 60 Minutes Intravenous Every 24 hours 08/14/18 2239 08/17/18 0821   08/15/18 0600  metroNIDAZOLE (FLAGYL) IVPB 500 mg  Status:  Discontinued     500 mg 100 mL/hr over 60 Minutes Intravenous Every 8 hours 08/14/18 2211 08/17/18 0908   08/14/18 1945  ciprofloxacin (CIPRO) IVPB 400 mg     400 mg 200 mL/hr over 60 Minutes Intravenous  Once 08/14/18 1934 08/14/18 2105   08/14/18 1945  metroNIDAZOLE (FLAGYL) IVPB 500 mg     500 mg 100 mL/hr over 60 Minutes Intravenous  Once 08/14/18 1934 08/14/18 2243      Subjective:   Sherry [name] seen and examined today.  Denies current nausea or vomiting, abdominal pain. Denies chest pain, shortness of breath, dizziness, headache. Only taking in small amounts of liquid and does not wish to eat.  Objective:   Vitals:   08/24/18 1538 08/24/18 1746 08/24/18 2022 08/25/18 0510  BP:  120/81 (!) 130/93 (!) 145/92  Pulse: (!) 109 (!) 109 (!) 112 (!) 102  Resp:   18 16  Temp:   98.9 F (37.2 C) 98.6 F (37 C)  TempSrc:   Oral Oral  SpO2:  95% 95% 95%  Weight:    53.2 kg  Height:        Intake/Output Summary (Last 24 hours) at 08/25/2018 1200 Last data filed at 08/25/2018 0500 Gross per 24 hour  Intake 60 ml  Output 450 ml  Net -390 ml   Filed Weights   08/23/18 0504 08/24/18 0634 08/25/18 0510  Weight: 51.7 kg 53.1 kg 53.2 kg   Exam  General: Well developed, well nourished, NAD, appears stated age  68: NCAT, mucous membranes moist.   Neck: Supple  Cardiovascular: S1 S2 auscultated, RRR, no murmur  Respiratory: Clear to auscultation bilaterally with equal chest rise  Abdomen: Soft, nontender, nondistended, + bowel sounds, air in ostomy bag  Extremities: warm dry without cyanosis clubbing or edema  Neuro: AAOx3, nonfocal  Psych: Appropriate mood and affect, pleasant   Data Reviewed: I have personally reviewed following labs and imaging  studies  CBC: Recent Labs  Lab 08/19/18 0450 08/21/18 0443 08/22/18 0357 08/23/18 0433 08/25/18 0422  WBC 10.7* 7.4 7.0 8.3 8.9  HGB 11.8* 11.5* 11.5* 12.2 12.3  HCT 36.7 34.9* 34.8* 37.5 37.9  MCV 91.8 90.4 90.4 91.5 91.5  PLT 207 220 214 235 119   Basic Metabolic Panel: Recent Labs  Lab 08/20/18 0448 08/21/18 0443 08/22/18 0357 08/23/18 0433 08/25/18 0422  NA 140 139 137 136 137  K 3.6 3.7 3.9 4.3 4.2  CL 111 110 107 105 107  CO2 24 25 27 27 26   GLUCOSE 160* 107* 114* 129* 91  BUN 10 10 10 9 9   CREATININE 0.69 0.63 0.56 0.48 0.54  CALCIUM 9.2 9.0 8.7* 9.2 9.2  MG  --  1.6* 1.9 2.0 1.7   GFR: Estimated Creatinine Clearance: 37.1 mL/min (by C-G formula based on SCr of 0.54 mg/dL). Liver Function Tests: No results for input(s): AST, ALT, ALKPHOS, BILITOT, PROT, ALBUMIN in the last 168 hours. No results for input(s): LIPASE, AMYLASE in the last 168 hours. No results for input(s): AMMONIA in the last 168 hours. Coagulation Profile: No results for  input(s): INR, PROTIME in the last 168 hours. Cardiac Enzymes: No results for input(s): CKTOTAL, CKMB, CKMBINDEX, TROPONINI in the last 168 hours. BNP (last 3 results) No results for input(s): PROBNP in the last 8760 hours. HbA1C: No results for input(s): HGBA1C in the last 72 hours. CBG: No results for input(s): GLUCAP in the last 168 hours. Lipid Profile: No results for input(s): CHOL, HDL, LDLCALC, TRIG, CHOLHDL, LDLDIRECT in the last 72 hours. Thyroid Function Tests: No results for input(s): TSH, T4TOTAL, FREET4, T3FREE, THYROIDAB in the last 72 hours. Anemia Panel: No results for input(s): VITAMINB12, FOLATE, FERRITIN, TIBC, IRON, RETICCTPCT in the last 72 hours. Urine analysis:    Component Value Date/Time   COLORURINE AMBER (A) 08/14/2018 1944   APPEARANCEUR CLOUDY (A) 08/14/2018 1944   LABSPEC 1.018 08/14/2018 1944   LABSPEC 1.030 12/06/2009 1628   PHURINE 5.0 08/14/2018 1944   GLUCOSEU NEGATIVE 08/14/2018  1944   HGBUR MODERATE (A) 08/14/2018 1944   BILIRUBINUR NEGATIVE 08/14/2018 1944   BILIRUBINUR 1+ 02/10/2016 1638   BILIRUBINUR Negative 12/06/2009 1628   KETONESUR 20 (A) 08/14/2018 1944   PROTEINUR 100 (A) 08/14/2018 1944   UROBILINOGEN 1.0 02/10/2016 1638   UROBILINOGEN 1.0 04/01/2010 1500   NITRITE POSITIVE (A) 08/14/2018 1944   LEUKOCYTESUR LARGE (A) 08/14/2018 1944   LEUKOCYTESUR Small 12/06/2009 1628   Sepsis Labs: @LABRCNTIP (procalcitonin:4,lacticidven:4)  ) Recent Results (from the past 240 hour(s))  Gastrointestinal Panel by PCR , Stool     Status: Abnormal   Collection Time: 08/15/18  2:30 PM  Result Value Ref Range Status   Campylobacter species NOT DETECTED NOT DETECTED Final   Plesimonas shigelloides NOT DETECTED NOT DETECTED Final   Salmonella species NOT DETECTED NOT DETECTED Final   Yersinia enterocolitica NOT DETECTED NOT DETECTED Final   Vibrio species NOT DETECTED NOT DETECTED Final   Vibrio cholerae NOT DETECTED NOT DETECTED Final   Enteroaggregative E coli (EAEC) NOT DETECTED NOT DETECTED Final   Enteropathogenic E coli (EPEC) NOT DETECTED NOT DETECTED Final   Enterotoxigenic E coli (ETEC) DETECTED (A) NOT DETECTED Final    Comment: RESULT CALLED TO, READ BACK BY AND VERIFIED WITH: ASHLEY BROWN 08/16/18 @ 1700  [place] like toxin producing E coli (STEC) NOT DETECTED NOT DETECTED Final   Shigella/Enteroinvasive E coli (EIEC) NOT DETECTED NOT DETECTED Final   Cryptosporidium NOT DETECTED NOT DETECTED Final   Cyclospora cayetanensis NOT DETECTED NOT DETECTED Final   Entamoeba histolytica NOT DETECTED NOT DETECTED Final   Giardia lamblia NOT DETECTED NOT DETECTED Final   Adenovirus F40/41 NOT DETECTED NOT DETECTED Final   Astrovirus NOT DETECTED NOT DETECTED Final   Norovirus GI/GII NOT DETECTED NOT DETECTED Final   Rotavirus A NOT DETECTED NOT DETECTED Final   Sapovirus (I, II, IV, and V) NOT DETECTED NOT DETECTED Final    Comment: Performed at  [facility], [address] [place], [place] [address]      Radiology Studies: Dg Abd 1 View  Result Date: 08/24/2018 CLINICAL DATA:  Pain.  Obstruction. EXAM: ABDOMEN - 1 VIEW COMPARISON:  August 20, 2018 FINDINGS: A left ureteral stent remains. Diffuse mildly dilated loops of small bowel persist. The caliber of the small bowel loops is mildly improved. There is contrast in the right colon and proximal transverse colon. IMPRESSION: Persistent small bowel obstruction with mild improvement in small bowel loops. Electronically Signed   By: Dorise Bullion III M.D   On: 08/24/2018 11:18     Scheduled  Meds: . darifenacin  7.5 mg Oral Daily  . docusate sodium  100 mg Oral BID  . enoxaparin (LOVENOX) injection  30 mg Subcutaneous Q24H  . feeding supplement (ENSURE ENLIVE)  237 mL Oral BID BM  . levothyroxine  50 mcg Intravenous Daily  . magnesium citrate  1 Bottle Oral Once  . metoCLOPramide (REGLAN) injection  2.5 mg Intravenous Q8H  . metoprolol succinate  12.5 mg Oral QPM  . QUEtiapine  25 mg Oral QHS   Continuous Infusions:    LOS: 10 days   Time Spent in minutes   30 minutes  Hawa Henly D.O. on 08/25/2018 at 12:00 PM  Between 7am to 7pm - Please see pager noted on [email or web address]  After 7pm go to [email or web address]  And look for the night coverage person covering for me after hours  [facility] Office  [phone]

## 2018-08-25 NOTE — Progress Notes (Signed)
Patient ID: Sherry [name], female   DOB: July 12, 1923, 84 y.o.   MRN: [id number]       Subjective: Pt with no nausea, no abdominal pain, no bloating.  Passing flatus.  No stool recently.  Eating some of her full liquids, but not much.  Just poor appetite not because of nausea.  Not mobilizing much at all.  Objective: Vital signs in last 24 hours: Temp:  [97.7 F (36.5 C)-98.9 F (37.2 C)] 98.6 F (37 C) (09/09 0510) Pulse Rate:  [100-112] 102 (09/09 0510) Resp:  [16-20] 16 (09/09 0510) BP: (96-152)/(58-110) 145/92 (09/09 0510) SpO2:  [92 %-95 %] 95 % (09/09 0510) Weight:  [53.2 kg] 53.2 kg (09/09 0510) Last BM Date: 08/21/18  Intake/Output from previous day: 09/08 0701 - 09/09 0700 In: 591.8 [P.O.:60; I.V.:531.8] Out: 775 [Urine:775] Intake/Output this shift: No intake/output data recorded.  PE: Heart: regular Lungs: CTAB Abd: soft, NT, ND, +BS, colostomy with flatus present in bag.  No stool.  Stoma is pink and viable.  Lab Results:  Recent Labs    08/23/18 0433 08/25/18 0422  WBC 8.3 8.9  HGB 12.2 12.3  HCT 37.5 37.9  PLT 235 268   BMET Recent Labs    08/23/18 0433 08/25/18 0422  NA 136 137  K 4.3 4.2  CL 105 107  CO2 27 26  GLUCOSE 129* 91  BUN 9 9  CREATININE 0.48 0.54  CALCIUM 9.2 9.2   PT/INR No results for input(s): LABPROT, INR in the last 72 hours. CMP     Component Value Date/Time   NA 137 08/25/2018 0422   NA 141 09/27/2015 0939   K 4.2 08/25/2018 0422   K 4.2 09/27/2015 0939   CL 107 08/25/2018 0422   CL 106 04/01/2013 1506   CO2 26 08/25/2018 0422   CO2 26 09/27/2015 0939   GLUCOSE 91 08/25/2018 0422   GLUCOSE 113 09/27/2015 0939   GLUCOSE 121 (H) 04/01/2013 1506   BUN 9 08/25/2018 0422   BUN 14.4 09/27/2015 0939   CREATININE 0.54 08/25/2018 0422   CREATININE 0.72 02/10/2016 1718   CREATININE 0.8 09/27/2015 0939   CALCIUM 9.2 08/25/2018 0422   CALCIUM 10.1 09/27/2015 0939   PROT 4.8 (L) 08/15/2018 0701   PROT 6.3 (L)  09/27/2015 0939   ALBUMIN 2.4 (L) 08/15/2018 0701   ALBUMIN 3.7 09/27/2015 0939   AST 12 (L) 08/15/2018 0701   AST 12 09/27/2015 0939   ALT 10 08/15/2018 0701   ALT 10 09/27/2015 0939   ALKPHOS 63 08/15/2018 0701   ALKPHOS 127 09/27/2015 0939   BILITOT 0.9 08/15/2018 0701   BILITOT 0.50 09/27/2015 0939   GFRNONAA >60 08/25/2018 0422   GFRAA >60 08/25/2018 0422   Lipase     Component Value Date/Time   LIPASE 22 08/14/2018 1821       Studies/Results: Dg Abd 1 View  Result Date: 08/24/2018 CLINICAL DATA:  Pain.  Obstruction. EXAM: ABDOMEN - 1 VIEW COMPARISON:  August 20, 2018 FINDINGS: A left ureteral stent remains. Diffuse mildly dilated loops of small bowel persist. The caliber of the small bowel loops is mildly improved. There is contrast in the right colon and proximal transverse colon. IMPRESSION: Persistent small bowel obstruction with mild improvement in small bowel loops. Electronically Signed   By: Dorise Bullion III M.D   On: 08/24/2018 11:18    Anti-infectives: Anti-infectives (From admission, onward)   Start     Dose/Rate Route Frequency Ordered Stop   08/18/18  2000  ciprofloxacin (CIPRO) IVPB 400 mg  Status:  Discontinued     400 mg 200 mL/hr over 60 Minutes Intravenous Every 12 hours 08/18/18 1113 08/19/18 1308   08/18/18 1400  metroNIDAZOLE (FLAGYL) IVPB 500 mg  Status:  Discontinued     500 mg 100 mL/hr over 60 Minutes Intravenous Every 8 hours 08/18/18 1113 08/20/18 1338   08/17/18 1400  metroNIDAZOLE (FLAGYL) tablet 500 mg  Status:  Discontinued     500 mg Oral Every 8 hours 08/17/18 0908 08/18/18 1113   08/17/18 1000  ciprofloxacin (CIPRO) tablet 500 mg  Status:  Discontinued     500 mg Oral 2 times daily 08/17/18 0822 08/18/18 1113   08/17/18 0830  cephALEXin (KEFLEX) capsule 500 mg  Status:  Discontinued     500 mg Oral Every 8 hours 08/17/18 0821 08/17/18 0822   08/16/18 0000  ciprofloxacin (CIPRO) 500 MG tablet     500 mg Oral 2 times daily 08/16/18  0929 08/20/18 2359   08/15/18 2000  ciprofloxacin (CIPRO) IVPB 400 mg  Status:  Discontinued     400 mg 200 mL/hr over 60 Minutes Intravenous Every 24 hours 08/14/18 2239 08/17/18 0821   08/15/18 0600  metroNIDAZOLE (FLAGYL) IVPB 500 mg  Status:  Discontinued     500 mg 100 mL/hr over 60 Minutes Intravenous Every 8 hours 08/14/18 2211 08/17/18 0908   08/14/18 1945  ciprofloxacin (CIPRO) IVPB 400 mg     400 mg 200 mL/hr over 60 Minutes Intravenous  Once 08/14/18 1934 08/14/18 2105   08/14/18 1945  metroNIDAZOLE (FLAGYL) IVPB 500 mg     500 mg 100 mL/hr over 60 Minutes Intravenous  Once 08/14/18 1934 08/14/18 2243       Assessment/Plan T2N1 stage III rectal cancer -APR 2011/chemotherapy Parastomal hernia repair 2011 status post appendectomy, cholecystectomy, multiple ureteral stent placements, bladder tumor resection 01/2018  AKI -resolved Elevated troponins  Hypertension Hypothyroid History of vascular dementia -on Exelon/Seroquel Hx of tobacco use AAA 5.1 cm  Nausea, vomiting and diarrhea SBO versus ileus(more likely ileus) E. coli UTI/enterotoxic E. Coli - positive GI panel -patient with no further N/V/abdominal pain.  Eating small amounts of full liquids.  Doesn't want solid food. -films yesterday still show contrast and large stool burden in right colon.  Patient does not appear to have an obstruction.  Will give some mag citrate today to get things moving to help with this process.   -needs to mobilize more or be out of bed in her chair more than she is.  HUO:HFGB liquids, could adv to soft, but patient doesn't want to ID: Cipro 8/29>>08/19/18; Flagyl 8/29>>9/4 DVT: Lovenox Foley: Wick in place Follow-up: TBD    LOS: 10 days    Henreitta Cea , [facility] 08/25/2018, 9:00 AM Pager: [phone]

## 2018-08-25 NOTE — Plan of Care (Signed)
°  Problem: Education: °Goal: Knowledge of General Education information will improve °Description: Including pain rating scale, medication(s)/side effects and non-pharmacologic comfort measures °Outcome: Progressing °  °Problem: Nutrition: °Goal: Adequate nutrition will be maintained °Outcome: Progressing °  °Problem: Skin Integrity: °Goal: Risk for impaired skin integrity will decrease °Outcome: Progressing °  °

## 2018-08-26 ENCOUNTER — Inpatient Hospital Stay (HOSPITAL_COMMUNITY): Payer: Medicare Other

## 2018-08-26 LAB — BASIC METABOLIC PANEL
Anion gap: 5 (ref 5–15)
BUN: 12 mg/dL (ref 8–23)
CHLORIDE: 103 mmol/L (ref 98–111)
CO2: 27 mmol/L (ref 22–32)
Calcium: 9 mg/dL (ref 8.9–10.3)
Creatinine, Ser: 0.51 mg/dL (ref 0.44–1.00)
GFR calc Af Amer: >60 mL/min (ref >60)
GFR calc non Af Amer: >60 mL/min (ref >60)
GLUCOSE: 87 mg/dL (ref 70–99)
POTASSIUM: 4 mmol/L (ref 3.5–5.1)
Sodium: 135 mmol/L (ref 135–145)

## 2018-08-26 LAB — CBC
HCT: 33.1 % — ABNORMAL LOW (ref 36.0–46.0)
HEMOGLOBIN: 10.8 g/dL — AB (ref 12.0–15.0)
MCH: 30.1 pg (ref 26.0–34.0)
MCHC: 32.6 g/dL (ref 30.0–36.0)
MCV: 92.2 fL (ref 78.0–100.0)
Platelets: 269 10*3/uL (ref 150–400)
RBC: 3.59 MIL/uL — AB (ref 3.87–5.11)
RDW: 16.5 % — ABNORMAL HIGH (ref 11.5–15.5)
WBC: 6.7 10*3/uL (ref 4.0–10.5)

## 2018-08-26 LAB — MAGNESIUM: MAGNESIUM: 2.1 mg/dL (ref 1.7–2.4)

## 2018-08-26 MED ORDER — POLYETHYLENE GLYCOL 3350 17 G PO PACK
17.0000 g | PACK | Freq: Two times a day (BID) | ORAL | Status: DC
  Administered 2018-08-26 – 2018-08-27 (×3): 17 g via ORAL
  Filled 2018-08-26 (×3): qty 1

## 2018-08-26 NOTE — Plan of Care (Signed)
  Problem: Education: Goal: Knowledge of General Education information will improve Description Including pain rating scale, medication(s)/side effects and non-pharmacologic comfort measures Outcome: Progressing   Problem: Nutrition: Goal: Adequate nutrition will be maintained Outcome: Progressing   Problem: Elimination: Goal: Will not experience complications related to bowel motility Outcome: Progressing   Problem: Skin Integrity: Goal: Risk for impaired skin integrity will decrease Outcome: Progressing

## 2018-08-26 NOTE — Progress Notes (Signed)
PROGRESS NOTE    Sherry [name]  IRW:[id number] DOB: Jun 23, 1923 DOA: 08/14/2018 PCP: Marletta Lor, MD   Brief Narrative:  HPI on 08/14/2018 by Dr. [name] Drea Sherry [name]  is a 84 y.o. female, w hypertension, hyperlipidemia, CAD, hypothyroidism, Copd, Jerrye [name], has c/o diarrhea (mild), for the past several days as well as nuasea.  Pt denies fever, chills, abd pain, emesis, constipation, brbpr, black stool, dysuria, hematuria.  Interim history Patient was admitted with diarrhea as well as acute kidney injury.  Now having nausea with what looks like a bowel obstruction versus ileus.  General surgery consulted and appreciated.  Assessment & Plan   Partial SBO versus ileus with nausea -developed nausea and vomiting on 08/19/2018 -KUB was done showing gas air-filled loops in the small bowel -General surgery consulted and appreciated as patient continues to have limited bowel sounds on exam -Abdominal x-ray showed persistent small bowel dilatation contrast has progressed into small bowel and right colon consistent with partial small bowel obstruction -Discussed with general surgery, recommended enema per ostomy. Continue low dose reglan  -Encouraged patient to get out of bed and mobilize. -Appears to be improving ws given mag citrate on 08/25/2018 -noted to have air/gas in ostomy bag -General surgery consulted and appreciated, feels this more ileus and that it is improving slowly. Placed patient on heart healthy diet   Diarrhea  -resolved -GI pathogen panel: Enterotoxigenic E. coli -Completed cipro and flagyl  Acute kidney injury -Resolved, creatinine currently 0.51  Urinary tract infection -UA showed cloudy appearance, large leukocytes, positive nitrites, few bacteria, >50 WBC -Urine culture: 80KEnterococcus faecalis, >100k E. Coli -Completed antibiotics  Hypomagnesemia -resolved with replacement, currently 2.1 -continue to monitor  Hypothyroidism -Continue IV Synthroid given  the patient is not taking her oral medications  Elevated troponin -Has remained flat -Denies chest pain or shortness of breath -EKG showed nonspecific ST segment changes -Echocardiogram EF 55 to 60%, no regional wall motion abnormalities.  Findings consistent with left ventricular diastolic dysfunction, intermediate for quantification of dysfunction grade  Skin pressure injury -Continue wound care and turn patient every 2 hours -Sacrum, stage II (?POA- not mentioned on H&P)  Vascular dementia -Continue Exelon and Seroquel at bedtime  Essential hypertension -Currently on metoprolol and PRN hydralazine  Severe malnutrition -Nutrition consulted, continue Ensure  Normocytic anemia -hemoglobin 10.8, baseline around 11 -Continue to monitor CBC  Rectal cancer, stage III/tumor  DVT Prophylaxis  lovenox  Code Status: Full  Family Communication: none at bedside  Disposition Plan: Admitted, pending improvement in bowel obstruction/ ileus- pending further recommendations from general surgery  Consultants General surgery  Procedures  None  Antibiotics   Anti-infectives (From admission, onward)   Start     Dose/Rate Route Frequency Ordered Stop   08/18/18 2000  ciprofloxacin (CIPRO) IVPB 400 mg  Status:  Discontinued     400 mg 200 mL/hr over 60 Minutes Intravenous Every 12 hours 08/18/18 1113 08/19/18 1308   08/18/18 1400  metroNIDAZOLE (FLAGYL) IVPB 500 mg  Status:  Discontinued     500 mg 100 mL/hr over 60 Minutes Intravenous Every 8 hours 08/18/18 1113 08/20/18 1338   08/17/18 1400  metroNIDAZOLE (FLAGYL) tablet 500 mg  Status:  Discontinued     500 mg Oral Every 8 hours 08/17/18 0908 08/18/18 1113   08/17/18 1000  ciprofloxacin (CIPRO) tablet 500 mg  Status:  Discontinued     500 mg Oral 2 times daily 08/17/18 0822 08/18/18 1113   08/17/18 0830  cephALEXin (KEFLEX) capsule  500 mg  Status:  Discontinued     500 mg Oral Every 8 hours 08/17/18 0821 08/17/18 0822   08/16/18  0000  ciprofloxacin (CIPRO) 500 MG tablet     500 mg Oral 2 times daily 08/16/18 0929 08/20/18 2359   08/15/18 2000  ciprofloxacin (CIPRO) IVPB 400 mg  Status:  Discontinued     400 mg 200 mL/hr over 60 Minutes Intravenous Every 24 hours 08/14/18 2239 08/17/18 0821   08/15/18 0600  metroNIDAZOLE (FLAGYL) IVPB 500 mg  Status:  Discontinued     500 mg 100 mL/hr over 60 Minutes Intravenous Every 8 hours 08/14/18 2211 08/17/18 0908   08/14/18 1945  ciprofloxacin (CIPRO) IVPB 400 mg     400 mg 200 mL/hr over 60 Minutes Intravenous  Once 08/14/18 1934 08/14/18 2105   08/14/18 1945  metroNIDAZOLE (FLAGYL) IVPB 500 mg     500 mg 100 mL/hr over 60 Minutes Intravenous  Once 08/14/18 1934 08/14/18 2243      Subjective:   [name] seen and examined today.  Denies chest pain, shortness of breath, abdominal pain, nausea, vomiting, dizziness, headache.   Objective:   Vitals:   08/25/18 2029 08/25/18 2033 08/26/18 0500 08/26/18 0517  BP: (!) 116/53 116/73  138/90  Pulse: (!) 107 (!) 106  88  Resp: 20   20  Temp: 97.8 F (36.6 C)   98.4 F (36.9 C)  TempSrc: Oral   Oral  SpO2: 93% 93%  92%  Weight:   53.9 kg   Height:        Intake/Output Summary (Last 24 hours) at 08/26/2018 1149 Last data filed at 08/25/2018 1800 Gross per 24 hour  Intake 240 ml  Output 300 ml  Net -60 ml   Filed Weights   08/24/18 0634 08/25/18 0510 08/26/18 0500  Weight: 53.1 kg 53.2 kg 53.9 kg   Exam  General: Well developed, well nourished, NAD, appears stated age  51: NCAT, mucous membranes moist.   Neck: Supple  Cardiovascular: S1 S2 auscultated, no rubs, murmurs or gallops. Regular rate and rhythm.  Respiratory: Clear to auscultation bilaterally with equal chest rise  Abdomen: Soft, nontender, nondistended, + bowel sounds, air in ostomy bag  Extremities: warm dry without cyanosis clubbing or edema  Neuro: AAOx3, nonfocal  Psych: Normal affect and demeanor, pleasant   Data Reviewed: I  have personally reviewed following labs and imaging studies  CBC: Recent Labs  Lab 08/21/18 0443 08/22/18 0357 08/23/18 0433 08/25/18 0422 08/26/18 0357  WBC 7.4 7.0 8.3 8.9 6.7  HGB 11.5* 11.5* 12.2 12.3 10.8*  HCT 34.9* 34.8* 37.5 37.9 33.1*  MCV 90.4 90.4 91.5 91.5 92.2  PLT 220 214 235 268 387   Basic Metabolic Panel: Recent Labs  Lab 08/21/18 0443 08/22/18 0357 08/23/18 0433 08/25/18 0422 08/26/18 0357  NA 139 137 136 137 135  K 3.7 3.9 4.3 4.2 4.0  CL 110 107 105 107 103  CO2 25 27 27 26 27   GLUCOSE 107* 114* 129* 91 87  BUN 10 10 9 9 12   CREATININE 0.63 0.56 0.48 0.54 0.51  CALCIUM 9.0 8.7* 9.2 9.2 9.0  MG 1.6* 1.9 2.0 1.7 2.1   GFR: Estimated Creatinine Clearance: 37.1 mL/min (by C-G formula based on SCr of 0.51 mg/dL). Liver Function Tests: No results for input(s): AST, ALT, ALKPHOS, BILITOT, PROT, ALBUMIN in the last 168 hours. No results for input(s): LIPASE, AMYLASE in the last 168 hours. No results for input(s): AMMONIA in the  last 168 hours. Coagulation Profile: No results for input(s): INR, PROTIME in the last 168 hours. Cardiac Enzymes: No results for input(s): CKTOTAL, CKMB, CKMBINDEX, TROPONINI in the last 168 hours. BNP (last 3 results) No results for input(s): PROBNP in the last 8760 hours. HbA1C: No results for input(s): HGBA1C in the last 72 hours. CBG: No results for input(s): GLUCAP in the last 168 hours. Lipid Profile: No results for input(s): CHOL, HDL, LDLCALC, TRIG, CHOLHDL, LDLDIRECT in the last 72 hours. Thyroid Function Tests: No results for input(s): TSH, T4TOTAL, FREET4, T3FREE, THYROIDAB in the last 72 hours. Anemia Panel: No results for input(s): VITAMINB12, FOLATE, FERRITIN, TIBC, IRON, RETICCTPCT in the last 72 hours. Urine analysis:    Component Value Date/Time   COLORURINE AMBER (A) 08/14/2018 1944   APPEARANCEUR CLOUDY (A) 08/14/2018 1944   LABSPEC 1.018 08/14/2018 1944   LABSPEC 1.030 12/06/2009 1628   PHURINE 5.0  08/14/2018 1944   GLUCOSEU NEGATIVE 08/14/2018 1944   HGBUR MODERATE (A) 08/14/2018 1944   BILIRUBINUR NEGATIVE 08/14/2018 1944   BILIRUBINUR 1+ 02/10/2016 1638   BILIRUBINUR Negative 12/06/2009 1628   KETONESUR 20 (A) 08/14/2018 1944   PROTEINUR 100 (A) 08/14/2018 1944   UROBILINOGEN 1.0 02/10/2016 1638   UROBILINOGEN 1.0 04/01/2010 1500   NITRITE POSITIVE (A) 08/14/2018 1944   LEUKOCYTESUR LARGE (A) 08/14/2018 1944   LEUKOCYTESUR Small 12/06/2009 1628   Sepsis Labs: @LABRCNTIP (procalcitonin:4,lacticidven:4)  ) No results found for this or any previous visit (from the past 240 hour(s)).    Radiology Studies: Dg Abd Portable 1v  Result Date: 08/26/2018 CLINICAL DATA:  Small bowel obstruction EXAM: PORTABLE ABDOMEN - 1 VIEW COMPARISON:  08/24/2018 FINDINGS: Left ureteral stent remains in place, unchanged. Contrast material noted within the colon. Continued dilated small bowel loops compatible with small bowel obstruction, not significantly changed. No free air. IMPRESSION: Continued small bowel obstruction pattern.  No real change. Electronically Signed   By: Rolm Baptise M.D.   On: 08/26/2018 09:18     Scheduled Meds: . darifenacin  7.5 mg Oral Daily  . docusate sodium  100 mg Oral BID  . enoxaparin (LOVENOX) injection  30 mg Subcutaneous Q24H  . feeding supplement (ENSURE ENLIVE)  237 mL Oral BID BM  . levothyroxine  100 mcg Oral Q0600  . metoCLOPramide (REGLAN) injection  2.5 mg Intravenous Q8H  . metoprolol succinate  12.5 mg Oral QPM  . polyethylene glycol  17 g Oral BID  . QUEtiapine  25 mg Oral QHS   Continuous Infusions:    LOS: 11 days   Time Spent in minutes   30 minutes  Peniel Biel D.O. on 08/26/2018 at 11:49 AM  Between 7am to 7pm - Please see pager noted on [email or web address]  After 7pm go to [email or web address]  And look for the night coverage person covering for me after hours  [facility] Office  [phone]

## 2018-08-26 NOTE — Progress Notes (Signed)
Encouraged by nursing staff multiple times throughout the day about the importance of ambulating to chair it get out of bed. Pt states "I feel too weak to get out of bed". RN will continue to monitor pt.

## 2018-08-26 NOTE — Progress Notes (Signed)
Patient ID: Sherry [name], female   DOB: [birth year], 84 y.o.   MRN: [id number]       Subjective: Patient more awake and alert today.  Denies abdominal pain today.  Nausea after mag citrate has resolved.  Objective: Vital signs in last 24 hours: Temp:  [97.8 F (36.6 C)-98.4 F (36.9 C)] 98.4 F (36.9 C) (09/10 0517) Pulse Rate:  [88-108] 88 (09/10 0517) Resp:  [20-26] 20 (09/10 0517) BP: (116-141)/(53-94) 138/90 (09/10 0517) SpO2:  [92 %-97 %] 92 % (09/10 0517) Weight:  [53.9 kg] 53.9 kg (09/10 0500) Last BM Date: 08/21/18  Intake/Output from previous day: 09/09 0701 - 09/10 0700 In: 360 [P.O.:360] Out: 300 [Urine:300] Intake/Output this shift: No intake/output data recorded.  PE: Abd: soft, NT, ND, +BS, colostomy with air in bag.  Lab Results:  Recent Labs    08/25/18 0422 08/26/18 0357  WBC 8.9 6.7  HGB 12.3 10.8*  HCT 37.9 33.1*  PLT 268 269   BMET Recent Labs    08/25/18 0422 08/26/18 0357  NA 137 135  K 4.2 4.0  CL 107 103  CO2 26 27  GLUCOSE 91 87  BUN 9 12  CREATININE 0.54 0.51  CALCIUM 9.2 9.0   PT/INR No results for input(s): LABPROT, INR in the last 72 hours. CMP     Component Value Date/Time   NA 135 08/26/2018 0357   NA 141 09/27/2015 0939   K 4.0 08/26/2018 0357   K 4.2 09/27/2015 0939   CL 103 08/26/2018 0357   CL 106 04/01/2013 1506   CO2 27 08/26/2018 0357   CO2 26 09/27/2015 0939   GLUCOSE 87 08/26/2018 0357   GLUCOSE 113 09/27/2015 0939   GLUCOSE 121 (H) 04/01/2013 1506   BUN 12 08/26/2018 0357   BUN 14.4 09/27/2015 0939   CREATININE 0.51 08/26/2018 0357   CREATININE 0.72 02/10/2016 1718   CREATININE 0.8 09/27/2015 0939   CALCIUM 9.0 08/26/2018 0357   CALCIUM 10.1 09/27/2015 0939   PROT 4.8 (L) 08/15/2018 0701   PROT 6.3 (L) 09/27/2015 0939   ALBUMIN 2.4 (L) 08/15/2018 0701   ALBUMIN 3.7 09/27/2015 0939   AST 12 (L) 08/15/2018 0701   AST 12 09/27/2015 0939   ALT 10 08/15/2018 0701   ALT 10 09/27/2015 0939   ALKPHOS 63 08/15/2018 0701   ALKPHOS 127 09/27/2015 0939   BILITOT 0.9 08/15/2018 0701   BILITOT 0.50 09/27/2015 0939   GFRNONAA >60 08/26/2018 0357   GFRAA >60 08/26/2018 0357   Lipase     Component Value Date/Time   LIPASE 22 08/14/2018 1821       Studies/Results: Dg Abd 1 View  Result Date: 08/24/2018 CLINICAL DATA:  Pain.  Obstruction. EXAM: ABDOMEN - 1 VIEW COMPARISON:  August 20, 2018 FINDINGS: A left ureteral stent remains. Diffuse mildly dilated loops of small bowel persist. The caliber of the small bowel loops is mildly improved. There is contrast in the right colon and proximal transverse colon. IMPRESSION: Persistent small bowel obstruction with mild improvement in small bowel loops. Electronically Signed   By: Dorise Bullion III M.D   On: 08/24/2018 11:18    Anti-infectives: Anti-infectives (From admission, onward)   Start     Dose/Rate Route Frequency Ordered Stop   08/18/18 2000  ciprofloxacin (CIPRO) IVPB 400 mg  Status:  Discontinued     400 mg 200 mL/hr over 60 Minutes Intravenous Every 12 hours 08/18/18 1113 08/19/18 1308   08/18/18 1400  metroNIDAZOLE (FLAGYL) IVPB  500 mg  Status:  Discontinued     500 mg 100 mL/hr over 60 Minutes Intravenous Every 8 hours 08/18/18 1113 08/20/18 1338   08/17/18 1400  metroNIDAZOLE (FLAGYL) tablet 500 mg  Status:  Discontinued     500 mg Oral Every 8 hours 08/17/18 0908 08/18/18 1113   08/17/18 1000  ciprofloxacin (CIPRO) tablet 500 mg  Status:  Discontinued     500 mg Oral 2 times daily 08/17/18 0822 08/18/18 1113   08/17/18 0830  cephALEXin (KEFLEX) capsule 500 mg  Status:  Discontinued     500 mg Oral Every 8 hours 08/17/18 0821 08/17/18 0822   08/16/18 0000  ciprofloxacin (CIPRO) 500 MG tablet     500 mg Oral 2 times daily 08/16/18 0929 08/20/18 2359   08/15/18 2000  ciprofloxacin (CIPRO) IVPB 400 mg  Status:  Discontinued     400 mg 200 mL/hr over 60 Minutes Intravenous Every 24 hours 08/14/18 2239 08/17/18 0821    08/15/18 0600  metroNIDAZOLE (FLAGYL) IVPB 500 mg  Status:  Discontinued     500 mg 100 mL/hr over 60 Minutes Intravenous Every 8 hours 08/14/18 2211 08/17/18 0908   08/14/18 1945  ciprofloxacin (CIPRO) IVPB 400 mg     400 mg 200 mL/hr over 60 Minutes Intravenous  Once 08/14/18 1934 08/14/18 2105   08/14/18 1945  metroNIDAZOLE (FLAGYL) IVPB 500 mg     500 mg 100 mL/hr over 60 Minutes Intravenous  Once 08/14/18 1934 08/14/18 2243       Assessment/Plan T2N1 stage III rectal cancer -APR 2011/chemotherapy Parastomal hernia repair 2011 status post appendectomy, cholecystectomy, multiple ureteral stent placements, bladder tumor resection 01/2018  AKI -resolved Elevated troponins  Hypertension Hypothyroid History of vascular dementia -on Exelon/Seroquel Hx of tobacco use AAA 5.1 cm  Nausea, vomiting and diarrhea Ileus(more likely ileus) E. coli UTI/enterotoxic E. Coli - positive GI panel -patient's films are improving today.  Contrast and stool have progressed closer towards her colostomy.  Small bowel dilatation is improved.   -heart healthy diet so she can pick whatever she would like to eat -BID miralax to help with hard/constipated stool. -needs to mobilize more or be out of bed in her chair more than she is.  GEX:BMWUX healthy diet ID: Cipro 8/29>>08/19/18; Flagyl 8/29>>9/4 DVT: Lovenox Foley: Elza Rafter in place Follow-up: PCP   LOS: 11 days    Henreitta Cea , [facility] 08/26/2018, 8:48 AM Pager: [phone]

## 2018-08-27 DIAGNOSIS — A041 Enterotoxigenic Escherichia coli infection: Secondary | ICD-10-CM | POA: Diagnosis not present

## 2018-08-27 DIAGNOSIS — K567 Ileus, unspecified: Secondary | ICD-10-CM

## 2018-08-27 DIAGNOSIS — Z7401 Bed confinement status: Secondary | ICD-10-CM | POA: Diagnosis not present

## 2018-08-27 DIAGNOSIS — E039 Hypothyroidism, unspecified: Secondary | ICD-10-CM | POA: Diagnosis not present

## 2018-08-27 DIAGNOSIS — I10 Essential (primary) hypertension: Secondary | ICD-10-CM | POA: Diagnosis not present

## 2018-08-27 DIAGNOSIS — A09 Infectious gastroenteritis and colitis, unspecified: Secondary | ICD-10-CM | POA: Diagnosis not present

## 2018-08-27 DIAGNOSIS — K529 Noninfective gastroenteritis and colitis, unspecified: Secondary | ICD-10-CM | POA: Diagnosis not present

## 2018-08-27 DIAGNOSIS — E43 Unspecified severe protein-calorie malnutrition: Secondary | ICD-10-CM | POA: Diagnosis not present

## 2018-08-27 DIAGNOSIS — M810 Age-related osteoporosis without current pathological fracture: Secondary | ICD-10-CM | POA: Diagnosis not present

## 2018-08-27 DIAGNOSIS — L89150 Pressure ulcer of sacral region, unstageable: Secondary | ICD-10-CM | POA: Diagnosis not present

## 2018-08-27 DIAGNOSIS — N179 Acute kidney failure, unspecified: Secondary | ICD-10-CM | POA: Diagnosis not present

## 2018-08-27 DIAGNOSIS — J449 Chronic obstructive pulmonary disease, unspecified: Secondary | ICD-10-CM | POA: Diagnosis not present

## 2018-08-27 DIAGNOSIS — N390 Urinary tract infection, site not specified: Secondary | ICD-10-CM | POA: Diagnosis not present

## 2018-08-27 DIAGNOSIS — K435 Parastomal hernia without obstruction or  gangrene: Secondary | ICD-10-CM | POA: Diagnosis not present

## 2018-08-27 DIAGNOSIS — K5669 Other partial intestinal obstruction: Secondary | ICD-10-CM | POA: Diagnosis not present

## 2018-08-27 DIAGNOSIS — R110 Nausea: Secondary | ICD-10-CM | POA: Diagnosis not present

## 2018-08-27 DIAGNOSIS — N135 Crossing vessel and stricture of ureter without hydronephrosis: Secondary | ICD-10-CM | POA: Diagnosis not present

## 2018-08-27 DIAGNOSIS — R404 Transient alteration of awareness: Secondary | ICD-10-CM | POA: Diagnosis not present

## 2018-08-27 DIAGNOSIS — Z8719 Personal history of other diseases of the digestive system: Secondary | ICD-10-CM | POA: Diagnosis not present

## 2018-08-27 DIAGNOSIS — F0390 Unspecified dementia without behavioral disturbance: Secondary | ICD-10-CM | POA: Diagnosis not present

## 2018-08-27 DIAGNOSIS — R634 Abnormal weight loss: Secondary | ICD-10-CM | POA: Diagnosis not present

## 2018-08-27 DIAGNOSIS — R197 Diarrhea, unspecified: Secondary | ICD-10-CM | POA: Diagnosis not present

## 2018-08-27 DIAGNOSIS — D509 Iron deficiency anemia, unspecified: Secondary | ICD-10-CM | POA: Diagnosis not present

## 2018-08-27 DIAGNOSIS — R112 Nausea with vomiting, unspecified: Secondary | ICD-10-CM | POA: Diagnosis not present

## 2018-08-27 DIAGNOSIS — R5381 Other malaise: Secondary | ICD-10-CM | POA: Diagnosis not present

## 2018-08-27 DIAGNOSIS — M2550 Pain in unspecified joint: Secondary | ICD-10-CM | POA: Diagnosis not present

## 2018-08-27 DIAGNOSIS — R41841 Cognitive communication deficit: Secondary | ICD-10-CM | POA: Diagnosis not present

## 2018-08-27 DIAGNOSIS — R278 Other lack of coordination: Secondary | ICD-10-CM | POA: Diagnosis not present

## 2018-08-27 DIAGNOSIS — R2689 Other abnormalities of gait and mobility: Secondary | ICD-10-CM | POA: Diagnosis not present

## 2018-08-27 LAB — CBC
HCT: 37.2 % (ref 36.0–46.0)
Hemoglobin: 11.9 g/dL — ABNORMAL LOW (ref 12.0–15.0)
MCH: 29.7 pg (ref 26.0–34.0)
MCHC: 32 g/dL (ref 30.0–36.0)
MCV: 92.8 fL (ref 78.0–100.0)
PLATELETS: 322 10*3/uL (ref 150–400)
RBC: 4.01 MIL/uL (ref 3.87–5.11)
RDW: 16.6 % — AB (ref 11.5–15.5)
WBC: 10.2 10*3/uL (ref 4.0–10.5)

## 2018-08-27 LAB — BASIC METABOLIC PANEL
Anion gap: 7 (ref 5–15)
BUN: 16 mg/dL (ref 8–23)
CALCIUM: 9.1 mg/dL (ref 8.9–10.3)
CO2: 26 mmol/L (ref 22–32)
Chloride: 103 mmol/L (ref 98–111)
Creatinine, Ser: 0.63 mg/dL (ref 0.44–1.00)
GFR calc Af Amer: >60 mL/min (ref >60)
GLUCOSE: 103 mg/dL — AB (ref 70–99)
Potassium: 4.3 mmol/L (ref 3.5–5.1)
Sodium: 136 mmol/L (ref 135–145)

## 2018-08-27 MED ORDER — POLYETHYLENE GLYCOL 3350 17 G PO PACK: 17.0000 g | PACK | Freq: Two times a day (BID) | ORAL | 0 refills | Status: AC

## 2018-08-27 MED ORDER — ENOXAPARIN SODIUM 40 MG/0.4ML ~~LOC~~ SOLN: 40.0000 mg | SUBCUTANEOUS | Status: DC

## 2018-08-27 MED ORDER — QUETIAPINE FUMARATE 25 MG PO TABS: 25.0000 mg | ORAL_TABLET | Freq: Every day | ORAL | 0 refills | Status: AC

## 2018-08-27 NOTE — Progress Notes (Signed)
Patient ID: Sherry [name], female   DOB: [birth year], 84 y.o.   MRN: [id number]       Subjective: Patient with no complaints.  Says she ate some yesterday with no nausea.  Began having BMs in colostomy yesterday as well.  Objective: Vital signs in last 24 hours: Temp:  [98.1 F (36.7 C)-98.7 F (37.1 C)] 98.1 F (36.7 C) (09/11 0509) Pulse Rate:  [91-96] 96 (09/11 0509) Resp:  [16-20] 20 (09/11 0509) BP: (116-139)/(77-90) 139/90 (09/11 0509) SpO2:  [90 %-94 %] 91 % (09/11 0509) Weight:  [52.3 kg] 52.3 kg (09/11 0509) Last BM Date: 08/21/18  Intake/Output from previous day: 09/10 0701 - 09/11 0700 In: 190 [P.O.:190] Out: 1000 [Urine:900; Stool:100] Intake/Output this shift: No intake/output data recorded.  PE: Abd: soft, NT, ND, +BS, colostomy with some stool present  Lab Results:  Recent Labs    08/26/18 0357 08/27/18 0411  WBC 6.7 10.2  HGB 10.8* 11.9*  HCT 33.1* 37.2  PLT 269 322   BMET Recent Labs    08/26/18 0357 08/27/18 0411  NA 135 136  K 4.0 4.3  CL 103 103  CO2 27 26  GLUCOSE 87 103*  BUN 12 16  CREATININE 0.51 0.63  CALCIUM 9.0 9.1   PT/INR No results for input(s): LABPROT, INR in the last 72 hours. CMP     Component Value Date/Time   NA 136 08/27/2018 0411   NA 141 09/27/2015 0939   K 4.3 08/27/2018 0411   K 4.2 09/27/2015 0939   CL 103 08/27/2018 0411   CL 106 04/01/2013 1506   CO2 26 08/27/2018 0411   CO2 26 09/27/2015 0939   GLUCOSE 103 (H) 08/27/2018 0411   GLUCOSE 113 09/27/2015 0939   GLUCOSE 121 (H) 04/01/2013 1506   BUN 16 08/27/2018 0411   BUN 14.4 09/27/2015 0939   CREATININE 0.63 08/27/2018 0411   CREATININE 0.72 02/10/2016 1718   CREATININE 0.8 09/27/2015 0939   CALCIUM 9.1 08/27/2018 0411   CALCIUM 10.1 09/27/2015 0939   PROT 4.8 (L) 08/15/2018 0701   PROT 6.3 (L) 09/27/2015 0939   ALBUMIN 2.4 (L) 08/15/2018 0701   ALBUMIN 3.7 09/27/2015 0939   AST 12 (L) 08/15/2018 0701   AST 12 09/27/2015 0939   ALT 10  08/15/2018 0701   ALT 10 09/27/2015 0939   ALKPHOS 63 08/15/2018 0701   ALKPHOS 127 09/27/2015 0939   BILITOT 0.9 08/15/2018 0701   BILITOT 0.50 09/27/2015 0939   GFRNONAA >60 08/27/2018 0411   GFRAA >60 08/27/2018 0411   Lipase     Component Value Date/Time   LIPASE 22 08/14/2018 1821       Studies/Results: Dg Abd Portable 1v  Result Date: 08/26/2018 CLINICAL DATA:  Small bowel obstruction EXAM: PORTABLE ABDOMEN - 1 VIEW COMPARISON:  08/24/2018 FINDINGS: Left ureteral stent remains in place, unchanged. Contrast material noted within the colon. Continued dilated small bowel loops compatible with small bowel obstruction, not significantly changed. No free air. IMPRESSION: Continued small bowel obstruction pattern.  No real change. Electronically Signed   By: Rolm Baptise M.D.   On: 08/26/2018 09:18    Anti-infectives: Anti-infectives (From admission, onward)   Start     Dose/Rate Route Frequency Ordered Stop   08/18/18 2000  ciprofloxacin (CIPRO) IVPB 400 mg  Status:  Discontinued     400 mg 200 mL/hr over 60 Minutes Intravenous Every 12 hours 08/18/18 1113 08/19/18 1308   08/18/18 1400  metroNIDAZOLE (FLAGYL) IVPB 500 mg  Status:  Discontinued     500 mg 100 mL/hr over 60 Minutes Intravenous Every 8 hours 08/18/18 1113 08/20/18 1338   08/17/18 1400  metroNIDAZOLE (FLAGYL) tablet 500 mg  Status:  Discontinued     500 mg Oral Every 8 hours 08/17/18 0908 08/18/18 1113   08/17/18 1000  ciprofloxacin (CIPRO) tablet 500 mg  Status:  Discontinued     500 mg Oral 2 times daily 08/17/18 0822 08/18/18 1113   08/17/18 0830  cephALEXin (KEFLEX) capsule 500 mg  Status:  Discontinued     500 mg Oral Every 8 hours 08/17/18 0821 08/17/18 0822   08/16/18 0000  ciprofloxacin (CIPRO) 500 MG tablet     500 mg Oral 2 times daily 08/16/18 0929 08/20/18 2359   08/15/18 2000  ciprofloxacin (CIPRO) IVPB 400 mg  Status:  Discontinued     400 mg 200 mL/hr over 60 Minutes Intravenous Every 24 hours  08/14/18 2239 08/17/18 0821   08/15/18 0600  metroNIDAZOLE (FLAGYL) IVPB 500 mg  Status:  Discontinued     500 mg 100 mL/hr over 60 Minutes Intravenous Every 8 hours 08/14/18 2211 08/17/18 0908   08/14/18 1945  ciprofloxacin (CIPRO) IVPB 400 mg     400 mg 200 mL/hr over 60 Minutes Intravenous  Once 08/14/18 1934 08/14/18 2105   08/14/18 1945  metroNIDAZOLE (FLAGYL) IVPB 500 mg     500 mg 100 mL/hr over 60 Minutes Intravenous  Once 08/14/18 1934 08/14/18 2243       Assessment/Plan T2N1 stage III rectal cancer -APR 2011/chemotherapy Parastomal hernia repair 2011 status post appendectomy, cholecystectomy, multiple ureteral stent placements, bladder tumor resection 01/2018  AKI -resolved Elevated troponins  Hypertension Hypothyroid History of vascular dementia -on Exelon/Seroquel Hx of tobacco use AAA 5.1 cm  Nausea, vomiting and diarrhea Ileus E. coli UTI/enterotoxic E. Coli - positive GI panel   -heart healthy diet  -BID miralax to help with hard/constipated stool. -needs to mobilize more or be out of bed in her chair more than she is. -no further surgical interventions.  Patient is surgically stable for DC home when medically stable.  We will sign off.  CBU:LAGTX healthy diet ID: Cipro 8/29>>08/19/18; Flagyl 8/29>>9/4 DVT: Lovenox Foley: Elza Rafter in place Follow-up: PCP   LOS: 12 days    Henreitta Cea , [facility] 08/27/2018, 8:03 AM Pager: [phone]

## 2018-08-27 NOTE — Progress Notes (Signed)
Nutrition Follow-up  DOCUMENTATION CODES:   Underweight, Severe malnutrition in context of chronic illness  INTERVENTION:   Ensure Enlive po BID, each supplement provides 350 kcal and 20 grams of protein  NUTRITION DIAGNOSIS:   Severe Malnutrition related to chronic illness, cancer and cancer related treatments as evidenced by percent weight loss, energy intake < or equal to 75% for > or equal to 1 month, moderate fat depletion, moderate muscle depletion, severe muscle depletion.  Ongoing  GOAL:   Patient will meet greater than or equal to 90% of their needs  Progressing  MONITOR:   PO intake, Weight trends, Labs, Supplement acceptance  REASON FOR ASSESSMENT:   Malnutrition Screening Tool    ASSESSMENT:   Patient with PMH significant for HTN, HLD, CAD, COPD, and GERD. Presents this admission with complaint of mild diarrhea and nausea for the last several days. Admitted for AKI with possible UTI.    8/29- NPO 8/31-low sodium heart healthy diet 9/2- NPO 9/3- pt developed nausea/vomiting, xray consistent with small bowel obstruction 9/7- full liquid diet 9/11- low sodium diet  Meal completions charted as 15-40% for pt's last three meals. Pt states her appetite is off and on. She drinks two Ensures daily and would like to continue. Denies nausea or issues with swallowing.   Weight noted to increase from 111 lb on 9/5 to 115 lb today.   Plan for pt to d/c today to SNF.   Medications reviewed and include: reglan TID Labs reviewed.   Diet Order:   Diet Order            Diet - low sodium heart healthy        Diet Heart Room service appropriate? Yes; Fluid consistency: Thin  Diet effective now              EDUCATION NEEDS:   Education needs have been addressed  Skin:  Skin Assessment: Reviewed RN Assessment  Last BM:  08/26/18  Height:   Ht Readings from Last 1 Encounters:  08/15/18 5\' 3"  (1.6 m)    Weight:   Wt Readings from Last 1 Encounters:   08/27/18 52.3 kg    Ideal Body Weight:  52.2 kg  BMI:  Body mass index is 20.42 kg/m.  Estimated Nutritional Needs:   Kcal:  1350-1550 kcal  Protein:  65-80 grams  Fluid:  >/= 1.3 L/day   Mariana Single RD, LDN Clinical Nutrition Pager # - [phone]

## 2018-08-27 NOTE — Clinical Social Work Placement (Addendum)
Pt admitting to [facility] report [phone]. [facility] transportation arranged 2pm. Daughter Joycelyn Schmid aware and agreeable. DC information provided via the [facility]  NOTE  Date:  08/27/2018  Patient Details  Name: Sherry [name] MRN: [id number] Date of Birth: 15-Jul-1923  Clinical Social Work is seeking post-discharge placement for this patient at the [place] level of care (*CSW will initial, date and re-position this form in  chart as items are completed):  Yes   Patient/family provided with [place] Work Department's list of facilities offering this level of care within the geographic area requested by the patient (or if unable, by the patient's family).  Yes   Patient/family informed of their freedom to choose among providers that offer the needed level of care, that participate in Medicare, Medicaid or managed care program needed by the patient, have an available bed and are willing to accept the patient.  Yes   Patient/family informed of [facility]'s ownership interest in [facility] and [facility], as well as of the fact that they are under no obligation to receive care at these facilities.  PASRR submitted to EDS on       PASRR number received on       Existing PASRR number confirmed on 08/16/18     FL2 transmitted to all facilities in geographic area requested by pt/family on 08/16/18     FL2 transmitted to all facilities within larger geographic area on       Patient informed that his/her managed care company has contracts with or will negotiate with certain facilities, including the following:        Yes   Patient/family informed of bed offers received.  Patient chooses bed at [facility]     Physician recommends and patient chooses bed at [facility]    Patient to be transferred to [facility] on 08/18/18.  Patient to be transferred to  facility by [facility]     Patient family notified on 08/18/18 of transfer.  Name of family member notified:  daughter [place]     PHYSICIAN       Additional Comment:    _______________________________________________ Nila Nephew, LCSW 08/27/2018, 12:42 PM  [phone]

## 2018-08-27 NOTE — Progress Notes (Signed)
Report called to Bernadene Person, RN at [facility].

## 2018-08-27 NOTE — Discharge Summary (Signed)
Physician Discharge Summary  Sherry [name] BPZ:[id number] DOB: [birth year] DOA: 08/14/2018  PCP: Marletta Lor, MD  Admit date: 08/14/2018 Discharge date: 08/27/2018  Admitted From: Home Disposition:  SNF  Discharge Condition:Stable CODE STATUS:FULL Diet recommendation: Heart Healthy   Brief/Interim Summary: Sherry [name] a84 y.o.female,w hypertension, hyperlipidemia, CAD, hypothyroidism, Copd, Gerd, has c/o diarrhea (mild), for the past several days as well as nuasea. Pt denied fever, chills, abd pain, emesis, constipation, brbpr, black stool, dysuria, hematuria. Imagings that were done during the admission showed bowel obstruction versus IBS.  General surgery consulted.  Her ileus has  currently resolved.  General surgery cleared her for discharge.  Following problems were addressed during hospitalization:  Partial SBO versus ileus with nausea -developed nausea and vomiting on 08/19/2018 -KUB was done showed gas air-filled loops in the small bowel -Abdominal x-ray showed persistent small bowel dilatation contrast has progressed into small bowel and right colon consistent with partial small bowel obstruction -Discussed with general surgery, recommended enema per ostomy. Continue low dose reglan  -Encouraged patient to get out of bed and mobilize. -Appears to be improving -Continue full liquid diet -Her Ileus has resolved.  She iss tolerating diet and having bowel movement -Continue miralax BID  Diarrhea  -resolved -GI pathogen panel: Enterotoxigenic E. coli -Completed cipro and flagyl  Acute kidney injury -Resolved  Urinary tract infection -UA showed cloudy appearance, large leukocytes, positive nitrites, few bacteria, >50 WBC -Urine culture: 80KEnterococcus faecalis, >100k E. Coli -Completed antibiotics  Hypomagnesemia -resolved with replacement  Hypothyroidism -Continue Synthroid   Elevated troponin -Has remained flat -Denies chest pain or  shortness of breath -EKG showed nonspecific ST segment changes -Echocardiogram EF 55 to 60%, no regional wall motion abnormalities.  Findings consistent with left ventricular diastolic dysfunction, intermediate for quantification of dysfunction grade  Skin pressure injury -Continue wound care and turn patient every 2 hours -Sacrum, stage II (?POA- not mentioned on H&P)  Vascular dementia -Continue Exelon and Seroquel at bedtime  Essential hypertension -Currently on metoprolol   Severe malnutrition -Nutrition consulted, continue Ensure   Discharge Diagnoses:  Active Problems:   Hypothyroidism   Essential hypertension   Diarrhea   Pressure injury of skin   Possible acute lower UTI   AKI (acute kidney injury) ([place])   Protein-calorie malnutrition, severe   Infectious colitis, enteritis and gastroenteritis   Ileus ([place])   Colitis   Dehydration    Discharge Instructions  Discharge Instructions    Diet - low sodium heart healthy   Complete by:  As directed    Discharge instructions   Complete by:  As directed    1) Take prescribed medication as instructed. 2)Do CBC and BMP tests in a week.   Increase activity slowly   Complete by:  As directed      Allergies as of 08/27/2018      Reactions   Percocet [oxycodone-acetaminophen] Nausea And Vomiting   Influenza Virus Vacc Split Pf Other (See Comments)   Unknown  But had to be hospitalized -1966   Sulfamethoxazole-trimethoprim Nausea And Vomiting   Hibiclens [chlorhexidine Gluconate] Rash      Medication List    STOP taking these medications   diphenoxylate-atropine 2.5-0.025 MG tablet Commonly known as:  LOMOTIL   traMADol-acetaminophen 37.5-325 MG tablet Commonly known as:  ULTRACET     TAKE these medications   acetaminophen 650 MG CR tablet Commonly known as:  TYLENOL Take 1,300 mg by mouth daily as needed for pain.   atorvastatin 10 MG tablet Commonly  known as:  LIPITOR TAKE 1 TABLET DAILY    levothyroxine 100 MCG tablet Commonly known as:  SYNTHROID, LEVOTHROID Take 100 mcg by mouth daily before breakfast. What changed:  Another medication with the same name was removed. Continue taking this medication, and follow the directions you see here.   ondansetron 4 MG tablet Commonly known as:  ZOFRAN Take 1 tablet (4 mg total) by mouth every 8 (eight) hours as needed for nausea or vomiting.   polyethylene glycol packet Commonly known as:  MIRALAX / GLYCOLAX Take 17 g by mouth 2 (two) times daily.   PROAIR HFA 108 (90 Base) MCG/ACT inhaler Generic drug:  albuterol USE 2 INHALATIONS INTO THE LUNGS EVERY 4 HOURS AS NEEDED FOR SHORTNESS OF BREATH What changed:  See the new instructions.   QUEtiapine 25 MG tablet Commonly known as:  SEROQUEL Take 1 tablet (25 mg total) by mouth at bedtime.   TOPROL XL 25 MG 24 hr tablet Generic drug:  metoprolol succinate TAKE ONE-HALF (1/2) TABLET DAILY What changed:    how much to take  how to take this  when to take this  additional instructions   Trospium Chloride 60 MG Cp24 Take 1 capsule by mouth daily.      Contact information for after-discharge care    Destination    [facility] .   Service:  Skilled Nursing Contact information: [place] [phone]             Allergies  Allergen Reactions  . Percocet [Oxycodone-Acetaminophen] Nausea And Vomiting  . Influenza Virus Vacc Split Pf Other (See Comments)    Unknown  But had to be hospitalized -1966  . Sulfamethoxazole-Trimethoprim Nausea And Vomiting  . Hibiclens [Chlorhexidine Gluconate] Rash    Consultations: General Surgery  Procedures/Studies: Ct Abdomen Pelvis Wo Contrast  Result Date: 08/14/2018 CLINICAL DATA:  Vomiting and diarrhea for several days. EXAM: CT ABDOMEN AND PELVIS WITHOUT CONTRAST TECHNIQUE: Multidetector CT imaging of the abdomen and pelvis was performed  following the standard protocol without IV contrast. COMPARISON:  08/06/2018 FINDINGS: Lower chest: Tortuosity and calcific atherosclerotic disease of the aorta. Calcific atherosclerotic disease of the coronary arteries. Mitral valve annular calcifications. Hepatobiliary: 6 mm hypoattenuated focus in the dome of the liver, too small to be actually characterize by CT. Scattered granulomata. Pancreas: Unremarkable. No pancreatic ductal dilatation or surrounding inflammatory changes. Spleen: Scattered granulomata. Adrenals/Urinary Tract: Normal adrenal glands. Punctate nonobstructive bilateral renal calculi. No evidence of hydronephrosis. Left renal scarring. Left ureteral double-J stent stable with proximal and in the left renal pelvis and distal end within the urinary bladder. Stomach/Bowel: No evidence of small-bowel obstruction. Moderate amount of formed stool in the proximal colon. Ostomy in the left lower quadrant without evidence of parastomal herniation. Diffuse pericolonic inflammatory changes of the loops upstream to the ostomy. Postsurgical changes from resection of the rectum. Vascular/Lymphatic: Extensive aortic atherosclerosis. Known infrarenal abdominal aortic aneurysm is stable measuring 5.1 x 5.0 cm. No periaortic free fluid. No evidence of lymphadenopathy. Reproductive: Status post hysterectomy. No adnexal masses. Other: No abdominal wall hernia or abnormality. No abdominopelvic ascites. Musculoskeletal: Stable 4 mm anterolisthesis of L4 on L5. Spondylosis at L4-L5. IMPRESSION: Interval development of mucosal thickening and pericolonic inflammatory changes of a long segment of the colon, upstream to the left lower quadrant colostomy. Moderate stool burden. Otherwise stable findings: Left double-J ureteral catheter. Known infrarenal abdominal aortic aneurysm, measuring 5.1 cm in greatest diameter. Marked atherosclerotic disease of  the aorta. Electronically Signed   By: Fidela Salisbury M.D.   On:  08/14/2018 19:25   Dg Chest 2 View  Result Date: 08/14/2018 CLINICAL DATA:  Vomiting and diarrhea. EXAM: CHEST - 2 VIEW COMPARISON:  11/21/2015 FINDINGS: The cardiac silhouette is normal. Apparent enlargement of the contour of the ascending aorta. Calcific atherosclerotic disease and tortuosity of the aorta. Thickening of the paratracheal stripe bilaterally. There is no evidence of focal airspace consolidation, pleural effusion or pneumothorax. Osseous structures are without acute abnormality. Soft tissues are grossly normal. IMPRESSION: Apparent enlargement of the contour of the ascending aorta. Aneurysmal dilation cannot be excluded. Thickening of the paratracheal stripe bilaterally. This may represent enlargement of the thyroid gland or other space-occupying lesion within the mediastinum. Electronically Signed   By: Fidela Salisbury M.D.   On: 08/14/2018 18:30   Dg Abd 1 View  Result Date: 08/24/2018 CLINICAL DATA:  Pain.  Obstruction. EXAM: ABDOMEN - 1 VIEW COMPARISON:  August 20, 2018 FINDINGS: A left ureteral stent remains. Diffuse mildly dilated loops of small bowel persist. The caliber of the small bowel loops is mildly improved. There is contrast in the right colon and proximal transverse colon. IMPRESSION: Persistent small bowel obstruction with mild improvement in small bowel loops. Electronically Signed   By: Dorise Bullion III M.D   On: 08/24/2018 11:18   Dg Abd 1 View  Result Date: 08/20/2018 CLINICAL DATA:  See bowel obstruction, follow-up EXAM: ABDOMEN - 1 VIEW COMPARISON:  08/19/2018 FINDINGS: Contrast is seen within a few dilated small bowel loops and within the RIGHT colon. Persistent dilatation of small bowel loops consistent with partial small bowel obstruction. No definite bowel wall thickening. Gaseous distention of stomach. LEFT ureteral stent again seen. Scattered atherosclerotic calcifications. Bones demineralized. IMPRESSION: Persistent small bowel dilatation though  contrast has progressed into the distal small bowel and RIGHT colon consistent with partial small bowel obstruction. Electronically Signed   By: Lavonia Dana M.D.   On: 08/20/2018 13:18   Dg Abd 1 View  Result Date: 08/19/2018 CLINICAL DATA:  Follow-up ileus EXAM: ABDOMEN - 1 VIEW COMPARISON:  KUB of August 18, 2018 FINDINGS: There remain loops of moderately distended gas-filled small bowel in the left mid and lower abdomen and in the midline. On the right there is normal caliber colon containing considerable stool. There is gas in the stomach. No definite rectal gas is observed. There is a left double pigtail ureteral stent. There is dense calcification in the wall of the upper abdominal aorta. There are innumerable splenic calcifications. The bony structures exhibit no acute abnormality. IMPRESSION: Findings compatible with distal small bowel obstruction or generalized small bowel ileus. Slightly increased gaseous distention of small-bowel loops is observed today. No free extraluminal gas is observed. Electronically Signed   By: David  Martinique M.D.   On: 08/19/2018 07:08   Dg Abd 1 View  Result Date: 08/18/2018 CLINICAL DATA:  83 year old female with continued nausea and recently resolved diarrhea EXAM: ABDOMEN - 1 VIEW COMPARISON:  CT scan of the abdomen and pelvis 08/14/2018 FINDINGS: Left double-J ureteral stent appears well positioned. Diffuse gaseous distention of multiple loops of small bowel throughout the abdomen is a new finding compared to the recent prior CT scan. Findings are concerning for bowel obstruction. Similar appearance of scattered punctate calcifications throughout the spleen consistent with old granulomatous disease. Surgical clips are present in the left lower quadrant. Left lower quadrant ostomy. The bones are diffusely demineralized. Atherosclerotic calcifications are present in the abdominal  aorta. No acute osseous abnormality. IMPRESSION: Interval development of numerous loops of  gas-filled and dilated small bowel throughout the abdomen concerning for small bowel obstruction. Electronically Signed   By: Jacqulynn Cadet M.D.   On: 08/18/2018 10:01   Ct Head Wo Contrast  Result Date: 08/14/2018 CLINICAL DATA:  Altered mental status EXAM: CT HEAD WITHOUT CONTRAST TECHNIQUE: Contiguous axial images were obtained from the base of the skull through the vertex without intravenous contrast. COMPARISON:  None. FINDINGS: Brain: Diffuse cerebral atrophy. Mild chronic small vessel disease. No acute intracranial abnormality. Specifically, no hemorrhage, hydrocephalus, mass lesion, acute infarction, or significant intracranial injury. Vascular: No hyperdense vessel or unexpected calcification. Skull: No acute calvarial abnormality. Sinuses/Orbits: No acute finding Other: None IMPRESSION: No acute intracranial abnormality. Atrophy, chronic microvascular disease. Electronically Signed   By: Rolm Baptise M.D.   On: 08/14/2018 19:13   Dg Abd Portable 1v  Result Date: 08/26/2018 CLINICAL DATA:  Small bowel obstruction EXAM: PORTABLE ABDOMEN - 1 VIEW COMPARISON:  08/24/2018 FINDINGS: Left ureteral stent remains in place, unchanged. Contrast material noted within the colon. Continued dilated small bowel loops compatible with small bowel obstruction, not significantly changed. No free air. IMPRESSION: Continued small bowel obstruction pattern.  No real change. Electronically Signed   By: Rolm Baptise M.D.   On: 08/26/2018 09:18   Dg Abd Portable 1v-small Bowel Obstruction Protocol-initial, 8 Hr Delay  Result Date: 08/19/2018 CLINICAL DATA:  Small bowel protocol 8 hour delay EXAM: PORTABLE ABDOMEN - 1 VIEW COMPARISON:  08/19/2018, 08/18/2018, CT 08/14/2018 FINDINGS: Left-sided ureteral stent. Vascular calcifications. Multiple loops of dilated small bowel measuring up to 4 cm. Faint contrast within dilated small bowel loops. Large amount of stool in the right colon. Multiple splenic  calcifications/granuloma. IMPRESSION: 1. Faintly visible contrast material within dilated small bowel loops. The slightly dense appearance of right abdominal stools is similar as compared with CT 08/14/2018. Electronically Signed   By: Donavan Foil M.D.   On: 08/19/2018 21:54   Ct Renal Stone Study  Result Date: 08/06/2018 CLINICAL DATA:  Flank pain. History of renal calculi. Known double-J stent on the left. History of rectal carcinoma EXAM: CT ABDOMEN AND PELVIS WITHOUT CONTRAST TECHNIQUE: Multidetector CT imaging of the abdomen and pelvis was performed following the standard protocol without intravenous contrast. Oral contrast was administered. COMPARISON:  December 17, 2017 FINDINGS: Lower chest: Lung bases are clear. There are foci of coronary artery calcification. Hepatobiliary: There are calcified granulomas throughout the liver. Occasional subcentimeter presumed cysts are stable. No new liver lesion is evident. Gallbladder is absent. There is no appreciable biliary duct dilatation. Pancreas: There is no evident pancreatic mass or inflammatory focus. Spleen: There are granulomas throughout the spleen. No focal splenic lesions beyond calcified granulomas are evident. Adrenals/Urinary Tract: Adrenals appear unremarkable bilaterally. There is a cyst in the medial upper pole right kidney measuring 1.5 x 1.5 cm. There is a mass along the lateral aspect of the right kidney measuring approximately 1 x 1 cm which has attenuation values suggesting probable mildly complicated cyst. There is a apparent hyperdense cyst along the lateral midportion left kidney. There is a double-J stent on the left extending from the left renal pelvis to the bladder. There are several tiny calculi in the lower pole left kidney. There is a 1 mm calculus in the lower pole the right kidney. Urinary bladder is midline with wall thickness within normal limits. Stomach/Bowel: There is fluid throughout the bowel with moderate stool in the  colon.  There is no appreciable bowel wall thickening or bowel dilatation. No free air evident. There is an ostomy in the left lower quadrant with evidence of parastomal herniation within this ostomy. The patient has had previous removal of the rectum. There is no evident bowel obstruction. No free air or portal venous air. Vascular/Lymphatic: There is extensive aortic and iliac artery atherosclerosis. There is an aneurysm of the distal abdominal aorta which currently measures 5.2 x 5.2 cm, increased in size compared to most recent study. There is no periaortic fluid. There is no adenopathy evident in the abdomen or pelvis. Reproductive: Uterus is absent.  No pelvic masses evident. Other: There is no periappendiceal region inflammatory change. There is no abscess or ascites in the abdomen or pelvis. Musculoskeletal: There is 4 mm of anterolisthesis of L4 on L5. There is vacuum phenomenon at L4-5. There are no blastic or lytic bone lesions. No intramuscular lesions are evident. IMPRESSION: 1. Double-J stent on the left extending from the left renal pelvis to the bladder. There are tiny calculi in the lower pole left kidney. Double-J stent potentially could obscure a somewhat larger calculus. There is also a 1 mm nonobstructing calculus in the lower pole of the right kidney. There is no evident hydronephrosis on either side. 2. Patient has had surgical removal of the rectum. There is a left lower quadrant ostomy with a stable peristomal hernia. No bowel obstruction evident. No abscess in the abdomen or pelvis. No periappendiceal region inflammation. Fluid is noted throughout most of the bowel. This finding may indicate a degree of ileus or enteritis. 3. Abdominal aortic aneurysm measuring 5.2 x 5.2 cm, increased in size from January 2019, at which time it measured 4.6 x 4.6 cm. This change may warrant vascular surgery consultation. 4. Evidence of prior granulomatous disease with granulomas in the liver and spleen. 5.   Gallbladder absent. Aortic aneurysm NOS (ICD10-I71.9). Aortic Atherosclerosis (ICD10-I70.0). Electronically Signed   By: Lowella Grip III M.D.   On: 08/06/2018 18:32       Subjective: Patient seen and examined the bedside this morning.  Remains comfortable.  Denies any abdominal pain.  Having bowel movements.  Stable for discharge to skilled nursing facility.  Discharge Exam: Vitals:   08/26/18 2117 08/27/18 0509  BP: 116/77 139/90  Pulse: 95 96  Resp: 18 20  Temp: 98.4 F (36.9 C) 98.1 F (36.7 C)  SpO2: 90% 91%   Vitals:   08/26/18 0517 08/26/18 1430 08/26/18 2117 08/27/18 0509  BP: 138/90 128/85 116/77 139/90  Pulse: 88 91 95 96  Resp: 20 16 18 20   Temp: 98.4 F (36.9 C) 98.7 F (37.1 C) 98.4 F (36.9 C) 98.1 F (36.7 C)  TempSrc: Oral Oral Oral Oral  SpO2: 92% 94% 90% 91%  Weight:    52.3 kg  Height:        General: Pt is alert, awake, not in acute distress Cardiovascular: RRR, S1/S2 +, no rubs, no gallops Respiratory: CTA bilaterally, no wheezing, no rhonchi Abdominal: Soft, NT, ND, bowel sounds +,ostomy Extremities: no edema, no cyanosis    The results of significant diagnostics from this hospitalization (including imaging, microbiology, ancillary and laboratory) are listed below for reference.     Microbiology: No results found for this or any previous visit (from the past 240 hour(s)).   Labs: BNP (last 3 results) No results for input(s): BNP in the last 8760 hours. Basic Metabolic Panel: Recent Labs  Lab 08/21/18 0443 08/22/18 0357 08/23/18 0433 08/25/18 0422  08/26/18 0357 08/27/18 0411  NA 139 137 136 137 135 136  K 3.7 3.9 4.3 4.2 4.0 4.3  CL 110 107 105 107 103 103  CO2 25 27 27 26 27 26   GLUCOSE 107* 114* 129* 91 87 103*  BUN 10 10 9 9 12 16   CREATININE 0.63 0.56 0.48 0.54 0.51 0.63  CALCIUM 9.0 8.7* 9.2 9.2 9.0 9.1  MG 1.6* 1.9 2.0 1.7 2.1  --    Liver Function Tests: No results for input(s): AST, ALT, ALKPHOS, BILITOT, PROT,  ALBUMIN in the last 168 hours. No results for input(s): LIPASE, AMYLASE in the last 168 hours. No results for input(s): AMMONIA in the last 168 hours. CBC: Recent Labs  Lab 08/22/18 0357 08/23/18 0433 08/25/18 0422 08/26/18 0357 08/27/18 0411  WBC 7.0 8.3 8.9 6.7 10.2  HGB 11.5* 12.2 12.3 10.8* 11.9*  HCT 34.8* 37.5 37.9 33.1* 37.2  MCV 90.4 91.5 91.5 92.2 92.8  PLT 214 235 268 269 322   Cardiac Enzymes: No results for input(s): CKTOTAL, CKMB, CKMBINDEX, TROPONINI in the last 168 hours. BNP: Invalid input(s): POCBNP CBG: No results for input(s): GLUCAP in the last 168 hours. D-Dimer No results for input(s): DDIMER in the last 72 hours. Hgb A1c No results for input(s): HGBA1C in the last 72 hours. Lipid Profile No results for input(s): CHOL, HDL, LDLCALC, TRIG, CHOLHDL, LDLDIRECT in the last 72 hours. Thyroid function studies No results for input(s): TSH, T4TOTAL, T3FREE, THYROIDAB in the last 72 hours.  Invalid input(s): FREET3 Anemia work up No results for input(s): VITAMINB12, FOLATE, FERRITIN, TIBC, IRON, RETICCTPCT in the last 72 hours. Urinalysis    Component Value Date/Time   COLORURINE AMBER (A) 08/14/2018 1944   APPEARANCEUR CLOUDY (A) 08/14/2018 1944   LABSPEC 1.018 08/14/2018 1944   LABSPEC 1.030 12/06/2009 1628   PHURINE 5.0 08/14/2018 1944   GLUCOSEU NEGATIVE 08/14/2018 1944   HGBUR MODERATE (A) 08/14/2018 1944   BILIRUBINUR NEGATIVE 08/14/2018 1944   BILIRUBINUR 1+ 02/10/2016 1638   BILIRUBINUR Negative 12/06/2009 1628   KETONESUR 20 (A) 08/14/2018 1944   PROTEINUR 100 (A) 08/14/2018 1944   UROBILINOGEN 1.0 02/10/2016 1638   UROBILINOGEN 1.0 04/01/2010 1500   NITRITE POSITIVE (A) 08/14/2018 1944   LEUKOCYTESUR LARGE (A) 08/14/2018 1944   LEUKOCYTESUR Small 12/06/2009 1628   Sepsis Labs Invalid input(s): PROCALCITONIN,  WBC,  LACTICIDVEN Microbiology No results found for this or any previous visit (from the past 240 hour(s)).  Please note: You  were cared for by a hospitalist during your hospital stay. Once you are discharged, your primary care physician will handle any further medical issues. Please note that NO REFILLS for any discharge medications will be authorized once you are discharged, as it is imperative that you return to your primary care physician (or establish a relationship with a primary care physician if you do not have one) for your post hospital discharge needs so that they can reassess your need for medications and monitor your lab values.    Time coordinating discharge: 40 minutes  SIGNED:   Shelly Coss, MD  [facility] 08/27/2018, 10:54 AM Pager [phone]  If 7PM-7AM, please contact night-coverage [email or web address] Password [email or web address]

## 2018-08-28 DIAGNOSIS — K5669 Other partial intestinal obstruction: Secondary | ICD-10-CM | POA: Diagnosis not present

## 2018-08-28 DIAGNOSIS — E039 Hypothyroidism, unspecified: Secondary | ICD-10-CM | POA: Diagnosis not present

## 2018-08-28 DIAGNOSIS — I10 Essential (primary) hypertension: Secondary | ICD-10-CM | POA: Diagnosis not present

## 2018-08-28 DIAGNOSIS — A041 Enterotoxigenic Escherichia coli infection: Secondary | ICD-10-CM | POA: Diagnosis not present

## 2018-09-03 ENCOUNTER — Other Ambulatory Visit: Payer: Self-pay | Admitting: *Deleted

## 2018-09-03 DIAGNOSIS — L89150 Pressure ulcer of sacral region, unstageable: Secondary | ICD-10-CM | POA: Diagnosis not present

## 2018-09-03 NOTE — Patient Outreach (Signed)
[facility]) Care Management  09/03/2018  Sherry [name] November 12, 1923 [id number]   Onsite IDT meeting at [facility], SW at facility, she reports that she will remain LTC at facility.   Plan to sign off at this time. [name]. Laymond Purser, RN, BSN, [place] [phone]) Business Cell  [phone]) Toll Free Office

## 2018-09-05 DIAGNOSIS — K5669 Other partial intestinal obstruction: Secondary | ICD-10-CM | POA: Diagnosis not present

## 2018-09-05 DIAGNOSIS — E039 Hypothyroidism, unspecified: Secondary | ICD-10-CM | POA: Diagnosis not present

## 2018-09-05 DIAGNOSIS — I10 Essential (primary) hypertension: Secondary | ICD-10-CM | POA: Diagnosis not present

## 2018-09-05 DIAGNOSIS — A041 Enterotoxigenic Escherichia coli infection: Secondary | ICD-10-CM | POA: Diagnosis not present

## 2018-09-09 DIAGNOSIS — L89150 Pressure ulcer of sacral region, unstageable: Secondary | ICD-10-CM | POA: Diagnosis not present

## 2018-09-11 DIAGNOSIS — R112 Nausea with vomiting, unspecified: Secondary | ICD-10-CM | POA: Diagnosis not present

## 2018-09-11 DIAGNOSIS — K5669 Other partial intestinal obstruction: Secondary | ICD-10-CM | POA: Diagnosis not present

## 2018-09-11 DIAGNOSIS — R634 Abnormal weight loss: Secondary | ICD-10-CM | POA: Diagnosis not present

## 2018-09-11 DIAGNOSIS — N135 Crossing vessel and stricture of ureter without hydronephrosis: Secondary | ICD-10-CM | POA: Diagnosis not present

## 2018-09-12 DIAGNOSIS — R634 Abnormal weight loss: Secondary | ICD-10-CM | POA: Diagnosis not present

## 2018-09-12 DIAGNOSIS — R1084 Generalized abdominal pain: Secondary | ICD-10-CM | POA: Diagnosis not present

## 2018-09-12 DIAGNOSIS — Z933 Colostomy status: Secondary | ICD-10-CM | POA: Diagnosis not present

## 2018-09-12 DIAGNOSIS — F0151 Vascular dementia with behavioral disturbance: Secondary | ICD-10-CM | POA: Diagnosis not present

## 2018-09-12 DIAGNOSIS — F0390 Unspecified dementia without behavioral disturbance: Secondary | ICD-10-CM | POA: Diagnosis not present

## 2018-09-12 DIAGNOSIS — I10 Essential (primary) hypertension: Secondary | ICD-10-CM | POA: Diagnosis not present

## 2018-09-12 DIAGNOSIS — I2510 Atherosclerotic heart disease of native coronary artery without angina pectoris: Secondary | ICD-10-CM | POA: Diagnosis not present

## 2018-09-12 DIAGNOSIS — E860 Dehydration: Secondary | ICD-10-CM | POA: Diagnosis not present

## 2018-09-12 DIAGNOSIS — M6281 Muscle weakness (generalized): Secondary | ICD-10-CM | POA: Diagnosis not present

## 2018-09-12 DIAGNOSIS — R112 Nausea with vomiting, unspecified: Secondary | ICD-10-CM | POA: Diagnosis not present

## 2018-09-12 DIAGNOSIS — M545 Low back pain: Secondary | ICD-10-CM | POA: Diagnosis not present

## 2018-09-12 DIAGNOSIS — F331 Major depressive disorder, recurrent, moderate: Secondary | ICD-10-CM | POA: Diagnosis not present

## 2018-09-12 DIAGNOSIS — G301 Alzheimer's disease with late onset: Secondary | ICD-10-CM | POA: Diagnosis not present

## 2018-09-12 DIAGNOSIS — D72829 Elevated white blood cell count, unspecified: Secondary | ICD-10-CM | POA: Diagnosis not present

## 2018-09-12 DIAGNOSIS — K589 Irritable bowel syndrome without diarrhea: Secondary | ICD-10-CM | POA: Diagnosis not present

## 2018-09-12 DIAGNOSIS — J449 Chronic obstructive pulmonary disease, unspecified: Secondary | ICD-10-CM | POA: Diagnosis not present

## 2018-09-12 DIAGNOSIS — Z85048 Personal history of other malignant neoplasm of rectum, rectosigmoid junction, and anus: Secondary | ICD-10-CM | POA: Diagnosis not present

## 2018-09-12 DIAGNOSIS — K567 Ileus, unspecified: Secondary | ICD-10-CM | POA: Diagnosis not present

## 2018-09-12 DIAGNOSIS — R109 Unspecified abdominal pain: Secondary | ICD-10-CM | POA: Diagnosis present

## 2018-09-12 DIAGNOSIS — E43 Unspecified severe protein-calorie malnutrition: Secondary | ICD-10-CM | POA: Diagnosis not present

## 2018-09-12 DIAGNOSIS — Y831 Surgical operation with implant of artificial internal device as the cause of abnormal reaction of the patient, or of later complication, without mention of misadventure at the time of the procedure: Secondary | ICD-10-CM | POA: Diagnosis not present

## 2018-09-12 DIAGNOSIS — T8389XA Other specified complication of genitourinary prosthetic devices, implants and grafts, initial encounter: Secondary | ICD-10-CM | POA: Diagnosis not present

## 2018-09-12 DIAGNOSIS — E785 Hyperlipidemia, unspecified: Secondary | ICD-10-CM | POA: Diagnosis not present

## 2018-09-12 DIAGNOSIS — G8929 Other chronic pain: Secondary | ICD-10-CM | POA: Diagnosis not present

## 2018-09-12 DIAGNOSIS — F321 Major depressive disorder, single episode, moderate: Secondary | ICD-10-CM | POA: Diagnosis not present

## 2018-09-12 DIAGNOSIS — N131 Hydronephrosis with ureteral stricture, not elsewhere classified: Secondary | ICD-10-CM | POA: Diagnosis not present

## 2018-09-12 DIAGNOSIS — T83192A Other mechanical complication of urinary stent, initial encounter: Secondary | ICD-10-CM | POA: Diagnosis not present

## 2018-09-12 DIAGNOSIS — L89154 Pressure ulcer of sacral region, stage 4: Secondary | ICD-10-CM | POA: Diagnosis not present

## 2018-09-12 DIAGNOSIS — D509 Iron deficiency anemia, unspecified: Secondary | ICD-10-CM | POA: Diagnosis not present

## 2018-09-12 DIAGNOSIS — R627 Adult failure to thrive: Secondary | ICD-10-CM | POA: Diagnosis not present

## 2018-09-12 DIAGNOSIS — Z87891 Personal history of nicotine dependence: Secondary | ICD-10-CM | POA: Diagnosis not present

## 2018-09-12 DIAGNOSIS — R110 Nausea: Secondary | ICD-10-CM | POA: Diagnosis not present

## 2018-09-12 DIAGNOSIS — L89153 Pressure ulcer of sacral region, stage 3: Secondary | ICD-10-CM | POA: Diagnosis not present

## 2018-09-12 DIAGNOSIS — Z932 Ileostomy status: Secondary | ICD-10-CM | POA: Diagnosis not present

## 2018-09-12 DIAGNOSIS — E039 Hypothyroidism, unspecified: Secondary | ICD-10-CM | POA: Diagnosis not present

## 2018-09-12 DIAGNOSIS — R630 Anorexia: Secondary | ICD-10-CM | POA: Diagnosis not present

## 2018-09-12 DIAGNOSIS — N200 Calculus of kidney: Secondary | ICD-10-CM | POA: Diagnosis not present

## 2018-09-12 DIAGNOSIS — F0150 Vascular dementia without behavioral disturbance: Secondary | ICD-10-CM | POA: Diagnosis not present

## 2018-09-12 DIAGNOSIS — K529 Noninfective gastroenteritis and colitis, unspecified: Secondary | ICD-10-CM | POA: Diagnosis not present

## 2018-09-12 DIAGNOSIS — M810 Age-related osteoporosis without current pathological fracture: Secondary | ICD-10-CM | POA: Diagnosis not present

## 2018-09-12 DIAGNOSIS — R4182 Altered mental status, unspecified: Secondary | ICD-10-CM | POA: Diagnosis not present

## 2018-09-12 DIAGNOSIS — A09 Infectious gastroenteritis and colitis, unspecified: Secondary | ICD-10-CM | POA: Diagnosis not present

## 2018-09-12 DIAGNOSIS — Z9049 Acquired absence of other specified parts of digestive tract: Secondary | ICD-10-CM | POA: Diagnosis not present

## 2018-09-12 DIAGNOSIS — K435 Parastomal hernia without obstruction or  gangrene: Secondary | ICD-10-CM | POA: Diagnosis not present

## 2018-09-12 DIAGNOSIS — I714 Abdominal aortic aneurysm, without rupture: Secondary | ICD-10-CM | POA: Diagnosis not present

## 2018-09-12 DIAGNOSIS — F0280 Dementia in other diseases classified elsewhere without behavioral disturbance: Secondary | ICD-10-CM | POA: Diagnosis not present

## 2018-09-12 DIAGNOSIS — R5383 Other fatigue: Secondary | ICD-10-CM | POA: Diagnosis not present

## 2018-09-12 DIAGNOSIS — N179 Acute kidney failure, unspecified: Secondary | ICD-10-CM | POA: Diagnosis not present

## 2018-09-12 DIAGNOSIS — K5669 Other partial intestinal obstruction: Secondary | ICD-10-CM | POA: Diagnosis not present

## 2018-09-12 DIAGNOSIS — R2681 Unsteadiness on feet: Secondary | ICD-10-CM | POA: Diagnosis not present

## 2018-09-12 DIAGNOSIS — N135 Crossing vessel and stricture of ureter without hydronephrosis: Secondary | ICD-10-CM | POA: Diagnosis not present

## 2018-09-12 DIAGNOSIS — J439 Emphysema, unspecified: Secondary | ICD-10-CM | POA: Diagnosis not present

## 2018-09-15 ENCOUNTER — Other Ambulatory Visit: Payer: Medicare Other

## 2018-09-15 ENCOUNTER — Encounter (HOSPITAL_COMMUNITY): Payer: Self-pay | Admitting: *Deleted

## 2018-09-15 ENCOUNTER — Other Ambulatory Visit: Payer: Self-pay | Admitting: Urology

## 2018-09-15 DIAGNOSIS — R1084 Generalized abdominal pain: Secondary | ICD-10-CM | POA: Diagnosis not present

## 2018-09-15 DIAGNOSIS — F0151 Vascular dementia with behavioral disturbance: Secondary | ICD-10-CM | POA: Diagnosis not present

## 2018-09-15 DIAGNOSIS — Z933 Colostomy status: Secondary | ICD-10-CM | POA: Diagnosis not present

## 2018-09-15 DIAGNOSIS — N179 Acute kidney failure, unspecified: Secondary | ICD-10-CM | POA: Diagnosis not present

## 2018-09-16 DIAGNOSIS — M810 Age-related osteoporosis without current pathological fracture: Secondary | ICD-10-CM | POA: Diagnosis not present

## 2018-09-16 DIAGNOSIS — I10 Essential (primary) hypertension: Secondary | ICD-10-CM | POA: Diagnosis not present

## 2018-09-16 DIAGNOSIS — F0150 Vascular dementia without behavioral disturbance: Secondary | ICD-10-CM | POA: Diagnosis not present

## 2018-09-16 DIAGNOSIS — E785 Hyperlipidemia, unspecified: Secondary | ICD-10-CM | POA: Diagnosis not present

## 2018-09-17 DIAGNOSIS — F0390 Unspecified dementia without behavioral disturbance: Secondary | ICD-10-CM | POA: Diagnosis not present

## 2018-09-17 DIAGNOSIS — F321 Major depressive disorder, single episode, moderate: Secondary | ICD-10-CM | POA: Diagnosis not present

## 2018-09-17 DIAGNOSIS — R627 Adult failure to thrive: Secondary | ICD-10-CM | POA: Diagnosis not present

## 2018-09-17 DIAGNOSIS — R630 Anorexia: Secondary | ICD-10-CM | POA: Diagnosis not present

## 2018-09-17 DIAGNOSIS — Z932 Ileostomy status: Secondary | ICD-10-CM | POA: Diagnosis not present

## 2018-09-18 DIAGNOSIS — L89153 Pressure ulcer of sacral region, stage 3: Secondary | ICD-10-CM | POA: Diagnosis not present

## 2018-09-20 ENCOUNTER — Other Ambulatory Visit: Payer: Self-pay | Admitting: Internal Medicine

## 2018-09-22 DIAGNOSIS — R627 Adult failure to thrive: Secondary | ICD-10-CM | POA: Diagnosis not present

## 2018-09-22 DIAGNOSIS — R630 Anorexia: Secondary | ICD-10-CM | POA: Diagnosis not present

## 2018-09-22 DIAGNOSIS — F321 Major depressive disorder, single episode, moderate: Secondary | ICD-10-CM | POA: Diagnosis not present

## 2018-09-22 DIAGNOSIS — F0390 Unspecified dementia without behavioral disturbance: Secondary | ICD-10-CM | POA: Diagnosis not present

## 2018-09-22 DIAGNOSIS — Z933 Colostomy status: Secondary | ICD-10-CM | POA: Diagnosis not present

## 2018-09-23 ENCOUNTER — Telehealth: Payer: Self-pay | Admitting: *Deleted

## 2018-09-23 DIAGNOSIS — M545 Low back pain: Secondary | ICD-10-CM | POA: Diagnosis not present

## 2018-09-23 DIAGNOSIS — R627 Adult failure to thrive: Secondary | ICD-10-CM | POA: Diagnosis not present

## 2018-09-23 DIAGNOSIS — F0390 Unspecified dementia without behavioral disturbance: Secondary | ICD-10-CM | POA: Diagnosis not present

## 2018-09-23 DIAGNOSIS — N200 Calculus of kidney: Secondary | ICD-10-CM | POA: Diagnosis not present

## 2018-09-23 NOTE — Telephone Encounter (Signed)
Noted  

## 2018-09-23 NOTE — Telephone Encounter (Signed)
Copied from [place] [id number]. Topic: General - Other >> Sep 23, 2018 12:05 PM Keene Breath wrote: Reason for CRM: Patient's daughter called to inform the office that her mom is currently in a [facility] [employer]) and she is receiving care from them at this time.  If there are any questions or concerns, please call Tacey Heap at [phone].

## 2018-09-24 DIAGNOSIS — R112 Nausea with vomiting, unspecified: Secondary | ICD-10-CM | POA: Diagnosis not present

## 2018-09-24 DIAGNOSIS — R630 Anorexia: Secondary | ICD-10-CM | POA: Diagnosis not present

## 2018-09-24 DIAGNOSIS — R109 Unspecified abdominal pain: Secondary | ICD-10-CM | POA: Diagnosis not present

## 2018-09-24 DIAGNOSIS — Z933 Colostomy status: Secondary | ICD-10-CM | POA: Diagnosis not present

## 2018-09-24 DIAGNOSIS — R627 Adult failure to thrive: Secondary | ICD-10-CM | POA: Diagnosis not present

## 2018-09-24 DIAGNOSIS — I10 Essential (primary) hypertension: Secondary | ICD-10-CM | POA: Diagnosis not present

## 2018-09-24 DIAGNOSIS — F0390 Unspecified dementia without behavioral disturbance: Secondary | ICD-10-CM | POA: Diagnosis not present

## 2018-09-25 ENCOUNTER — Encounter (HOSPITAL_COMMUNITY): Payer: Self-pay | Admitting: *Deleted

## 2018-09-25 DIAGNOSIS — R627 Adult failure to thrive: Secondary | ICD-10-CM | POA: Diagnosis not present

## 2018-09-25 DIAGNOSIS — L89153 Pressure ulcer of sacral region, stage 3: Secondary | ICD-10-CM | POA: Diagnosis not present

## 2018-09-25 DIAGNOSIS — Z933 Colostomy status: Secondary | ICD-10-CM | POA: Diagnosis not present

## 2018-09-25 DIAGNOSIS — R112 Nausea with vomiting, unspecified: Secondary | ICD-10-CM | POA: Diagnosis not present

## 2018-09-25 DIAGNOSIS — R109 Unspecified abdominal pain: Secondary | ICD-10-CM | POA: Diagnosis not present

## 2018-09-25 DIAGNOSIS — F0390 Unspecified dementia without behavioral disturbance: Secondary | ICD-10-CM | POA: Diagnosis not present

## 2018-09-25 DIAGNOSIS — R630 Anorexia: Secondary | ICD-10-CM | POA: Diagnosis not present

## 2018-09-25 NOTE — Progress Notes (Addendum)
Preop instructions for: Lynniah Janoski                       Date of Birth : [birth year]                           Date of Procedure:10/02/2018        Doctor: Dr [name]  Time to arrive at [facility]: 1030 Report to: Admitting  Procedure: Cystoscopoy, stent placement  Any procedure time changes, MD office will notify you!   Do not eat or drink past midnight the night before your procedure.(To include any tube feedings-must be discontinued) Reminder:Follow bowel prep instructions per MD office!   Take these morning medications only with sips of water.(or give through gastrostomy or feeding tube).   Synthroid, Use Inhalers as usual and bring, trospium chloride, Metoprolol if takes in am    Facility contact:  [facility]                  Phone: [place]:  Transportation contact phone#: [facility] [phone]  Please send day of procedure:current med list and meds last taken that day, confirm nothing by mouth status from what time, Patient Demographic info( to include DNR status, problem list, allergies)   RN contact name/phone#: Gillian Shields RN                             and Fax [phone]  [place] card and picture ID Leave all jewelry and other valuables at place where living( no metal or rings to be worn) No contact lens Women-no make-up, no lotions,perfumes,powders   Any questions day of procedure,call [place] [phone]   Sent from :[facility]                   [place]                   Fax:[phone]  Sent by :Gillian Shields RN

## 2018-09-26 DIAGNOSIS — R630 Anorexia: Secondary | ICD-10-CM | POA: Diagnosis not present

## 2018-09-26 DIAGNOSIS — R627 Adult failure to thrive: Secondary | ICD-10-CM | POA: Diagnosis not present

## 2018-09-26 DIAGNOSIS — F0390 Unspecified dementia without behavioral disturbance: Secondary | ICD-10-CM | POA: Diagnosis not present

## 2018-09-26 DIAGNOSIS — Z933 Colostomy status: Secondary | ICD-10-CM | POA: Diagnosis not present

## 2018-09-26 DIAGNOSIS — R109 Unspecified abdominal pain: Secondary | ICD-10-CM | POA: Diagnosis not present

## 2018-09-26 DIAGNOSIS — K567 Ileus, unspecified: Secondary | ICD-10-CM | POA: Diagnosis not present

## 2018-09-29 DIAGNOSIS — R627 Adult failure to thrive: Secondary | ICD-10-CM | POA: Diagnosis not present

## 2018-09-29 DIAGNOSIS — I10 Essential (primary) hypertension: Secondary | ICD-10-CM | POA: Diagnosis not present

## 2018-09-29 DIAGNOSIS — R5383 Other fatigue: Secondary | ICD-10-CM | POA: Diagnosis not present

## 2018-09-29 DIAGNOSIS — D72829 Elevated white blood cell count, unspecified: Secondary | ICD-10-CM | POA: Diagnosis not present

## 2018-10-01 ENCOUNTER — Other Ambulatory Visit: Payer: Self-pay | Admitting: *Deleted

## 2018-10-01 DIAGNOSIS — R627 Adult failure to thrive: Secondary | ICD-10-CM | POA: Diagnosis not present

## 2018-10-01 DIAGNOSIS — R110 Nausea: Secondary | ICD-10-CM | POA: Diagnosis not present

## 2018-10-01 DIAGNOSIS — M545 Low back pain: Secondary | ICD-10-CM | POA: Diagnosis not present

## 2018-10-01 DIAGNOSIS — F0390 Unspecified dementia without behavioral disturbance: Secondary | ICD-10-CM | POA: Diagnosis not present

## 2018-10-01 DIAGNOSIS — N200 Calculus of kidney: Secondary | ICD-10-CM | POA: Diagnosis not present

## 2018-10-01 DIAGNOSIS — E860 Dehydration: Secondary | ICD-10-CM | POA: Diagnosis not present

## 2018-10-01 NOTE — Patient Outreach (Signed)
[facility]) Care Management  10/01/2018  Sherry [name] 1923/06/25 [id number]  Met with Marita [place], discharge planner at faciltiy.  She reports patient will be remaining LTC with palliative care following.  Plan to close as no [facility] care management needs identified as patient will be LTC resident at facility.  [name]. Laymond Purser, RN, BSN, [place] [phone]) Business Cell  [phone]) Toll Free Office

## 2018-10-02 ENCOUNTER — Ambulatory Visit (HOSPITAL_COMMUNITY): Payer: Medicare Other | Admitting: Anesthesiology

## 2018-10-02 ENCOUNTER — Encounter (HOSPITAL_COMMUNITY): Payer: Self-pay | Admitting: *Deleted

## 2018-10-02 ENCOUNTER — Encounter (HOSPITAL_COMMUNITY)
Admission: RE | Disposition: A | Discharge status: Home / Self Care | Payer: Self-pay | Source: Ambulatory Visit | Attending: Urology

## 2018-10-02 ENCOUNTER — Ambulatory Visit (HOSPITAL_COMMUNITY): Payer: Medicare Other

## 2018-10-02 ENCOUNTER — Other Ambulatory Visit: Payer: Self-pay

## 2018-10-02 ENCOUNTER — Ambulatory Visit (HOSPITAL_COMMUNITY)
Admission: RE | Admit: 2018-10-02 | Discharge: 2018-10-02 | Disposition: A | Discharge status: Home / Self Care | Payer: Medicare Other | Source: Ambulatory Visit | Attending: Urology | Admitting: Urology

## 2018-10-02 DIAGNOSIS — Z933 Colostomy status: Secondary | ICD-10-CM | POA: Insufficient documentation

## 2018-10-02 DIAGNOSIS — N135 Crossing vessel and stricture of ureter without hydronephrosis: Secondary | ICD-10-CM | POA: Insufficient documentation

## 2018-10-02 DIAGNOSIS — I714 Abdominal aortic aneurysm, without rupture: Secondary | ICD-10-CM | POA: Diagnosis not present

## 2018-10-02 DIAGNOSIS — F0150 Vascular dementia without behavioral disturbance: Secondary | ICD-10-CM | POA: Diagnosis not present

## 2018-10-02 DIAGNOSIS — Z85048 Personal history of other malignant neoplasm of rectum, rectosigmoid junction, and anus: Secondary | ICD-10-CM | POA: Diagnosis not present

## 2018-10-02 DIAGNOSIS — N131 Hydronephrosis with ureteral stricture, not elsewhere classified: Secondary | ICD-10-CM | POA: Diagnosis not present

## 2018-10-02 DIAGNOSIS — I2510 Atherosclerotic heart disease of native coronary artery without angina pectoris: Secondary | ICD-10-CM | POA: Diagnosis not present

## 2018-10-02 DIAGNOSIS — J439 Emphysema, unspecified: Secondary | ICD-10-CM | POA: Diagnosis not present

## 2018-10-02 DIAGNOSIS — T83192A Other mechanical complication of urinary stent, initial encounter: Secondary | ICD-10-CM | POA: Diagnosis not present

## 2018-10-02 DIAGNOSIS — Z87891 Personal history of nicotine dependence: Secondary | ICD-10-CM | POA: Insufficient documentation

## 2018-10-02 DIAGNOSIS — I10 Essential (primary) hypertension: Secondary | ICD-10-CM | POA: Diagnosis not present

## 2018-10-02 DIAGNOSIS — Z9049 Acquired absence of other specified parts of digestive tract: Secondary | ICD-10-CM | POA: Insufficient documentation

## 2018-10-02 DIAGNOSIS — Y831 Surgical operation with implant of artificial internal device as the cause of abnormal reaction of the patient, or of later complication, without mention of misadventure at the time of the procedure: Secondary | ICD-10-CM | POA: Insufficient documentation

## 2018-10-02 DIAGNOSIS — T8389XA Other specified complication of genitourinary prosthetic devices, implants and grafts, initial encounter: Secondary | ICD-10-CM | POA: Diagnosis not present

## 2018-10-02 HISTORY — DX: Noninfective gastroenteritis and colitis, unspecified: K52.9

## 2018-10-02 HISTORY — DX: Vascular dementia, unspecified severity, without behavioral disturbance, psychotic disturbance, mood disturbance, and anxiety: F01.50

## 2018-10-02 HISTORY — PX: CYSTOSCOPY W/ URETERAL STENT PLACEMENT: SHX1429

## 2018-10-02 HISTORY — DX: Unspecified protein-calorie malnutrition: E46

## 2018-10-02 HISTORY — DX: Parastomal hernia without obstruction or gangrene: K43.5

## 2018-10-02 LAB — CBC
HCT: 39.4 % (ref 36.0–46.0)
Hemoglobin: 12.1 g/dL (ref 12.0–15.0)
MCH: 30 pg (ref 26.0–34.0)
MCHC: 30.7 g/dL (ref 30.0–36.0)
MCV: 97.5 fL (ref 80.0–100.0)
Platelets: 259 10*3/uL (ref 150–400)
RBC: 4.04 MIL/uL (ref 3.87–5.11)
RDW: 16.5 % — AB (ref 11.5–15.5)
WBC: 12 10*3/uL — ABNORMAL HIGH (ref 4.0–10.5)
nRBC: 0 % (ref 0.0–0.2)

## 2018-10-02 LAB — BASIC METABOLIC PANEL
Anion gap: 11 (ref 5–15)
BUN: 27 mg/dL — ABNORMAL HIGH (ref 8–23)
CALCIUM: 10.6 mg/dL — AB (ref 8.9–10.3)
CO2: 26 mmol/L (ref 22–32)
CREATININE: 0.65 mg/dL (ref 0.44–1.00)
Chloride: 104 mmol/L (ref 98–111)
GFR calc Af Amer: >60 mL/min (ref >60)
GFR calc non Af Amer: >60 mL/min (ref >60)
GLUCOSE: 139 mg/dL — AB (ref 70–99)
Potassium: 4 mmol/L (ref 3.5–5.1)
Sodium: 141 mmol/L (ref 135–145)

## 2018-10-02 SURGERY — CYSTOSCOPY, WITH RETROGRADE PYELOGRAM AND URETERAL STENT INSERTION
Anesthesia: Monitor Anesthesia Care | Laterality: Left

## 2018-10-02 MED ORDER — 0.9 % SODIUM CHLORIDE (POUR BTL) OPTIME
TOPICAL | Status: DC | PRN
  Administered 2018-10-02: 1000 mL

## 2018-10-02 MED ORDER — ACETAMINOPHEN 10 MG/ML IV SOLN
600.0000 mg | Freq: Once | INTRAVENOUS | Status: AC
  Administered 2018-10-02: 600 mg via INTRAVENOUS

## 2018-10-02 MED ORDER — FENTANYL CITRATE (PF) 100 MCG/2ML IJ SOLN: 25.0000 ug | INTRAMUSCULAR | Status: DC | PRN

## 2018-10-02 MED ORDER — CEFUROXIME AXETIL 250 MG PO TABS: 250.0000 mg | ORAL_TABLET | Freq: Two times a day (BID) | ORAL | 0 refills | Status: AC

## 2018-10-02 MED ORDER — LIDOCAINE 2% (20 MG/ML) 5 ML SYRINGE
INTRAMUSCULAR | Status: AC
  Filled 2018-10-02: qty 5

## 2018-10-02 MED ORDER — LACTATED RINGERS IV SOLN
INTRAVENOUS | Status: DC
  Administered 2018-10-02: 11:00:00 via INTRAVENOUS

## 2018-10-02 MED ORDER — SODIUM CHLORIDE 0.9 % IR SOLN
Status: DC | PRN
  Administered 2018-10-02: 6000 mL via INTRAVESICAL

## 2018-10-02 MED ORDER — PROPOFOL 10 MG/ML IV BOLUS
INTRAVENOUS | Status: AC
  Filled 2018-10-02: qty 20

## 2018-10-02 MED ORDER — FENTANYL CITRATE (PF) 100 MCG/2ML IJ SOLN
INTRAMUSCULAR | Status: DC | PRN
  Administered 2018-10-02 (×2): 25 ug via INTRAVENOUS

## 2018-10-02 MED ORDER — CEFAZOLIN SODIUM-DEXTROSE 2-4 GM/100ML-% IV SOLN
2.0000 g | INTRAVENOUS | Status: AC
  Administered 2018-10-02: 2 g via INTRAVENOUS
  Filled 2018-10-02: qty 100

## 2018-10-02 MED ORDER — ONDANSETRON HCL 4 MG/2ML IJ SOLN
INTRAMUSCULAR | Status: AC
  Filled 2018-10-02: qty 2

## 2018-10-02 MED ORDER — ACETAMINOPHEN 10 MG/ML IV SOLN: 600.0000 mg | Freq: Four times a day (QID) | INTRAVENOUS | Status: DC

## 2018-10-02 MED ORDER — IOHEXOL 300 MG/ML  SOLN
INTRAMUSCULAR | Status: DC | PRN
  Administered 2018-10-02: 3 mL via URETHRAL

## 2018-10-02 MED ORDER — ONDANSETRON HCL 4 MG/2ML IJ SOLN
INTRAMUSCULAR | Status: DC | PRN
  Administered 2018-10-02: 4 mg via INTRAVENOUS

## 2018-10-02 MED ORDER — PROPOFOL 500 MG/50ML IV EMUL
INTRAVENOUS | Status: DC | PRN
  Administered 2018-10-02: 15 ug/kg/min via INTRAVENOUS

## 2018-10-02 MED ORDER — FENTANYL CITRATE (PF) 100 MCG/2ML IJ SOLN
INTRAMUSCULAR | Status: AC
  Filled 2018-10-02: qty 2

## 2018-10-02 MED ORDER — PROPOFOL 500 MG/50ML IV EMUL
INTRAVENOUS | Status: DC | PRN
  Administered 2018-10-02 (×5): 5 mg via INTRAVENOUS

## 2018-10-02 MED ORDER — ACETAMINOPHEN 10 MG/ML IV SOLN
INTRAVENOUS | Status: AC
  Filled 2018-10-02: qty 100

## 2018-10-02 SURGICAL SUPPLY — 20 items
BAG URO CATCHER STRL LF (MISCELLANEOUS) ×3 IMPLANT
CATH INTERMIT  6FR 70CM (CATHETERS) ×3 IMPLANT
CLOTH BEACON ORANGE TIMEOUT ST (SAFETY) ×3 IMPLANT
COVER WAND RF STERILE (DRAPES) IMPLANT
EXTRACTOR STONE NITINOL NGAGE (UROLOGICAL SUPPLIES) IMPLANT
FIBER LASER TRAC TIP (UROLOGICAL SUPPLIES) IMPLANT
GLOVE BIO SURGEON STRL SZ8 (GLOVE) ×3 IMPLANT
GOWN STRL REUS W/TWL XL LVL3 (GOWN DISPOSABLE) ×3 IMPLANT
GUIDEWIRE ANG ZIPWIRE 038X150 (WIRE) IMPLANT
GUIDEWIRE STR DUAL SENSOR (WIRE) ×3 IMPLANT
IV NS 1000ML (IV SOLUTION)
IV NS 1000ML BAXH (IV SOLUTION) IMPLANT
MANIFOLD NEPTUNE II (INSTRUMENTS) ×3 IMPLANT
PACK CYSTO (CUSTOM PROCEDURE TRAY) ×3 IMPLANT
SHEATH URETERAL 12FRX35CM (MISCELLANEOUS) IMPLANT
STENT CONTOUR 6FRX26X.038 (STENTS) IMPLANT
STENT URET 6FRX24 CONTOUR (STENTS) ×3 IMPLANT
TUBE FEEDING 8FR 16IN STR KANG (MISCELLANEOUS) IMPLANT
TUBING CONNECTING 10 (TUBING) ×2 IMPLANT
TUBING CONNECTING 10' (TUBING) ×1

## 2018-10-02 NOTE — Anesthesia Postprocedure Evaluation (Signed)
Anesthesia Post Note  Patient: Sherry [name]  Procedure(s) Performed: CYSTOSCOPY WITH RETROGRADE PYELOGRAM/URETERAL STENT EXCHANGE (Left )     Patient location during evaluation: PACU Anesthesia Type: MAC Post-procedure mental status: baseline. Pain management: pain level controlled Vital Signs Assessment: post-procedure vital signs reviewed and stable Respiratory status: spontaneous breathing, nonlabored ventilation, respiratory function stable and patient connected to nasal cannula oxygen Cardiovascular status: stable and blood pressure returned to baseline Postop Assessment: no apparent nausea or vomiting Anesthetic complications: no    Last Vitals:  Vitals:   10/02/18 1415 10/02/18 1435  BP: 132/79 128/85  Pulse: 80 84  Resp: 14 16  Temp: 36.4 C 37.1 C  SpO2: 96% 98%    Last Pain:  Vitals:   10/02/18 1435  TempSrc:   PainSc: 0-No pain                 Effie Berkshire

## 2018-10-02 NOTE — H&P (Signed)
Urology Admission H&P  Chief Complaint: left flank pain  History of Present Illness: Sherry [name] is a 84yo with a hx fo left ureteral stricture managed with an indwellign stent. She also has rigth upper pole high grade TCC. She is known to encrust her stents. She is having worsening left flank pain and worsening LUTS  Past Medical History:  Diagnosis Date  . AAA (abdominal aortic aneurysm) ([place])   . Anemia    Iron deficiency   . Arthritis   . Bronchitis   . CAD (coronary artery disease)   . COPD (chronic obstructive pulmonary disease) ([place])   . Emphysema of lung ([place])   . Gastroenteritis    colitis  . GERD (gastroesophageal reflux disease)   . Headache(784.0)   . History of blood transfusion   . History of kidney stones   . Hyperlipidemia   . Hypertension   . Hypothyroidism   . Osteoporosis   . Parastomal hernia   . Protein calorie malnutrition ([place])   . Rectal adenocarcinoma ([place]) dx'd 08/2009  . Rectal cancer ([place])   . Scoliosis    per Dr Melony Overly ortho  . Shingles   . Shortness of breath   . Tendon adhesions    torn tendon right shoulder  . Vascular dementia [facility])    Past Surgical History:  Procedure Laterality Date  . APPENDECTOMY    . BALLOON DILATION Left 04/10/2017   Procedure: BALLOON DILATION OF LEFT URETERAL STRICTURE;  Surgeon: Cleon Gustin, MD;  Location: [facility];  Service: Urology;  Laterality: Left;  . CARDIAC CATHETERIZATION    . CHOLECYSTECTOMY    . COLON REMOVAL     8 " 12/2009 DR TSUEI  . COLON SURGERY     apr for rectal cancer 2011  . COLONOSCOPY    . COLOSTOMY    . CYSTOSCOPY W/ URETERAL STENT PLACEMENT Left 03/18/2017   Procedure: CYSTOSCOPY WITH RETROGRADE PYELOGRAM/URETERAL STENT PLACEMENT AND URETERAL DIAGNOSTIC LEFT;  Surgeon: Cleon Gustin, MD;  Location: [facility];  Service: Urology;  Laterality: Left;  1 HR [phone] [id number] A [phone] - pt to arrive at 7:00 to receive blood  . CYSTOSCOPY W/ URETERAL STENT  PLACEMENT Left 04/10/2017   Procedure: CYSTOSCOPY WITH STENT REPLACEMENT;  Surgeon: Cleon Gustin, MD;  Location: [facility];  Service: Urology;  Laterality: Left;  . CYSTOSCOPY W/ URETERAL STENT PLACEMENT Left 06/03/2017   Procedure: CYSTOSCOPY WITH LEFT RETROGRADE PYELOGRAM/LEFT URETERAL  BALLOON DILATION AND LEFT URETERAL STENT PLACEMENT;  Surgeon: Cleon Gustin, MD;  Location: [facility];  Service: Urology;  Laterality: Left;  . CYSTOSCOPY W/ URETERAL STENT PLACEMENT Left 09/16/2017   Procedure: CYSTOSCOPY WITH LEFT STENT EXCHANGE/ RETROGRADE;  Surgeon: Cleon Gustin, MD;  Location: [facility];  Service: Urology;  Laterality: Left;  NEEDS TOTAL 30 MIN  . CYSTOSCOPY W/ URETERAL STENT PLACEMENT Left 02/12/2018   Procedure: CYSTOSCOPY WITH RETROGRADE PYELOGRAM/URETERAL STENT EXCHANGE/HOLMIUM LASER;  Surgeon: Cleon Gustin, MD;  Location: [facility];  Service: Urology;  Laterality: Left;  . CYSTOSCOPY W/ URETERAL STENT PLACEMENT Left 04/30/2018   Procedure: CYSTOSCOPY WITH LEFT RETROGRADE PYELOGRAM/LEFT URETERAL STENT EXCHANGE;  Surgeon: Cleon Gustin, MD;  Location: [facility];  Service: Urology;  Laterality: Left;  . CYSTOSCOPY W/ URETERAL STENT PLACEMENT Left 07/09/2018   Procedure: CYSTOSCOPY WITH LEFT RETROGRADE PYELOGRAM/LEFT URETERAL STENT EXCHANGE;  Surgeon: Cleon Gustin, MD;  Location: [facility];  Service: Urology;  Laterality: Left;  . CYSTOSCOPY WITH HOLMIUM LASER LITHOTRIPSY  07/09/2018   Procedure:  CYSTOSCOPY WITH HOLMIUM LASER LITHOTRIPSY BLADDER CALCULUS;  Surgeon: Cleon Gustin, MD;  Location: [facility];  Service: Urology;;  . Consuela Mimes WITH RETROGRADE PYELOGRAM, URETEROSCOPY AND STENT PLACEMENT Left 03/29/2016   Procedure: CYSTOSCOPY WITH RETROGRADE PYELOGRAM, URETEROSCOPY AND STENT PLACEMENT;  Surgeon: Kathie Rhodes, MD;  Location: [facility];  Service: Urology;  Laterality: Left;  With Laser  . CYSTOSCOPY/RETROGRADE/URETEROSCOPY Left 01/28/2017   Procedure:  CYSTOSCOPY/RETROGRADE/URETEROSCOPY (FLEX 6 fr) THULIUM LASER OF TUMOR;  Surgeon: Franchot Gallo, MD;  Location: [facility];  Service: Urology;  Laterality: Left;  . CYSTOSCOPY/RETROGRADE/URETEROSCOPY Left 04/10/2017   Procedure: CYSTOSCOPY, LEFT RETROGRADE PYELOGRAM LEFT URETEROSCOPY;  Surgeon: Cleon Gustin, MD;  Location: [facility];  Service: Urology;  Laterality: Left;  . EYE SURGERY     Cataract with lens- bil  . HOLMIUM LASER APPLICATION Left 1/76/1607   Procedure: HOLMIUM LASER ABLATION LEFT URETERAL TUMOR;  Surgeon: Cleon Gustin, MD;  Location: [facility];  Service: Urology;  Laterality: Left;  . HOLMIUM LASER APPLICATION Left 3/71/0626   Procedure: HOLMIUM LASER LITHOTRIPSY BLADDER CALCULUS (LEFT ENCRUSTED URETERAL STENT);  Surgeon: Cleon Gustin, MD;  Location: [facility];  Service: Urology;  Laterality: Left;  . OOPHORECTOMY    . ovary tumor removal     after son was born  . PARASTOMAL HERNIA REPAIR  10/30/2012  . POLYPECTOMY    . RECTUM REMOVAL     1/11 DR TSUEI  . TRANSURETHRAL RESECTION OF BLADDER TUMOR N/A 10/21/2017   Procedure: TRANSURETHRAL RESECTION OF BLADDER TUMOR (TURBT);  Surgeon: Cleon Gustin, MD;  Location: [facility];  Service: Urology;  Laterality: N/A;  . TRANSURETHRAL RESECTION OF BLADDER TUMOR N/A 02/12/2018   Procedure: TRANSURETHRAL RESECTION OF BLADDER TUMOR (TURBT);  Surgeon: Cleon Gustin, MD;  Location: [facility];  Service: Urology;  Laterality: N/A;  . TUBAL LIGATION    . VAGINAL HYSTERECTOMY    . VENTRAL HERNIA REPAIR  10/30/2012   Procedure: LAPAROSCOPIC VENTRAL HERNIA;  Surgeon: Imogene Burn. Georgette Dover, MD;  Location: [place];  Service: General;  Laterality: N/A;  Laparoscopic repair of parastomal hernia    Home Medications:  Current Facility-Administered Medications  Medication Dose Route Frequency Provider Last Rate Last Dose  . ceFAZolin (ANCEF) IVPB 2g/100 mL premix  2 g Intravenous On Call to OR Alyson Ingles Candee Furbish, MD      . lactated ringers  infusion   Intravenous Continuous Effie Berkshire, MD 20 mL/hr at 10/02/18 1126     Allergies:  Allergies  Allergen Reactions  . Percocet [Oxycodone-Acetaminophen] Nausea And Vomiting  . Influenza Virus Vacc Split Pf Other (See Comments)    Unknown  But had to be hospitalized -1966  . Sulfamethoxazole-Trimethoprim Nausea And Vomiting  . Hibiclens [Chlorhexidine Gluconate] Rash    Family History  Problem Relation Age of Onset  . Heart failure Sister   . Cervical cancer Sister   . Heart failure Brother   . Colon cancer Brother   . Colon cancer Brother   . Heart failure Father   . Cancer Mother        HEAD AND NECK  . Cervical cancer Daughter   . Kidney cancer Daughter    Social History:  reports that she quit smoking about 14 years ago. Her smoking use included cigarettes. She has a 97.50 pack-year smoking history. She has never used smokeless tobacco. She reports that she does not drink alcohol or use drugs.  Review of Systems  Genitourinary: Positive for flank pain, frequency and urgency.  All other systems reviewed and are negative.   Physical Exam:  Vital signs in last 24 hours: Temp:  [95.8 F (35.4 C)] 95.8 F (35.4 C) (10/17 1104) Pulse Rate:  [62] 62 (10/17 1104) Resp:  [16] 16 (10/17 1104) BP: (124)/(98) 124/98 (10/17 1104) SpO2:  [98 %] 98 % (10/17 1104) Weight:  [42.2 kg] 42.2 kg (10/17 1104) Physical Exam  Constitutional: She is oriented to person, place, and time. She appears well-developed. No distress.  HENT:  Head: Normocephalic and atraumatic.  Eyes: Pupils are equal, round, and reactive to light. EOM are normal.  Neck: Normal range of motion. No thyromegaly present.  Cardiovascular: Normal rate and regular rhythm.  Respiratory: Effort normal. No respiratory distress.  GI: Soft. She exhibits no distension.  Musculoskeletal: Normal range of motion. She exhibits no edema.  Neurological: She is alert and oriented to person, place, and time.  Skin:  Skin is warm and dry.  Psychiatric: She has a normal mood and affect. Her behavior is normal. Judgment and thought content normal.    Laboratory Data:  Results for orders placed or performed during the hospital encounter of 10/02/18 (from the past 24 hour(s))  CBC     Status: Abnormal   Collection Time: 10/02/18 11:03 AM  Result Value Ref Range   WBC 12.0 (H) 4.0 - 10.5 K/uL   RBC 4.04 3.87 - 5.11 MIL/uL   Hemoglobin 12.1 12.0 - 15.0 g/dL   HCT 39.4 36.0 - 46.0 %   MCV 97.5 80.0 - 100.0 fL   MCH 30.0 26.0 - 34.0 pg   MCHC 30.7 30.0 - 36.0 g/dL   RDW 16.5 (H) 11.5 - 15.5 %   Platelets 259 150 - 400 K/uL   nRBC 0.0 0.0 - 0.2 %  Basic metabolic panel     Status: Abnormal   Collection Time: 10/02/18 11:03 AM  Result Value Ref Range   Sodium 141 135 - 145 mmol/L   Potassium 4.0 3.5 - 5.1 mmol/L   Chloride 104 98 - 111 mmol/L   CO2 26 22 - 32 mmol/L   Glucose, Bld 139 (H) 70 - 99 mg/dL   BUN 27 (H) 8 - 23 mg/dL   Creatinine, Ser 0.65 0.44 - 1.00 mg/dL   Calcium 10.6 (H) 8.9 - 10.3 mg/dL   GFR calc non Af Amer >60 >60 mL/min   GFR calc Af Amer >60 >60 mL/min   Anion gap 11 5 - 15   No results found for this or any previous visit (from the past 240 hour(s)). Creatinine: Recent Labs    10/02/18 1103  CREATININE 0.65   Baseline Creatinine: 0.65  Impression/Assessment:  84yo with left ureteral stricture  Plan:  The risks/benefits/alternatives to left ureteral stent exchange was explained to the patient and she and her daughter understand and wish to proceed with surgery  Sherry [name] 10/02/2018, 12:52 PM

## 2018-10-02 NOTE — Anesthesia Procedure Notes (Addendum)
Procedure Name: MAC Date/Time: 10/02/2018 1:05 PM Performed by: West Pugh, CRNA Pre-anesthesia Checklist: Patient identified, Emergency Drugs available, Suction available, Patient being monitored and Timeout performed Patient Re-evaluated:Patient Re-evaluated prior to induction Oxygen Delivery Method: Simple face mask Preoxygenation: Pre-oxygenation with 100% oxygen Induction Type: IV induction Number of attempts: 1 Placement Confirmation: positive ETCO2 Dental Injury: Teeth and Oropharynx as per pre-operative assessment

## 2018-10-02 NOTE — Transfer of Care (Signed)
Immediate Anesthesia Transfer of Care Note  Patient: Sherry [name]  Procedure(s) Performed: CYSTOSCOPY WITH RETROGRADE PYELOGRAM/URETERAL STENT EXCHANGE (Left )  Patient Location: PACU  Anesthesia Type:MAC  Level of Consciousness: awake, drowsy and patient cooperative  Airway & Oxygen Therapy: Patient Spontanous Breathing and Patient connected to face mask oxygen  Post-op Assessment: Report given to RN and Post -op Vital signs reviewed and stable  Post vital signs: Reviewed and stable  Last Vitals:  Vitals Value Taken Time  BP 129/87 10/02/2018  1:48 PM  Temp    Pulse 78 10/02/2018  1:52 PM  Resp 18 10/02/2018  1:52 PM  SpO2 100 % 10/02/2018  1:52 PM  Vitals shown include unvalidated device data.  Last Pain:  Vitals:   10/02/18 1104  TempSrc: Axillary         Complications: No apparent anesthesia complications

## 2018-10-02 NOTE — Op Note (Signed)
Preoperative diagnosis: Leftencrusted ureteral stent  Postoperative diagnosis: Same  Procedure: 1 cystoscopy 2. Left retrograde pyelography 3. Intraoperative fluoroscopy, under one hour, with interpretation 4. Left6x 59DJ stentexchange  Attending: Rosie Fate  Anesthesia: General  Estimated blood loss: None  Drains: Left6x 24JJ ureteral stent withouttether  Specimens: urine for culture  Antibiotics:ancef  Findings:moderate encrustation of the ureteral stent. nolefthydronephrosis. No masses/lesions in the bladder. Ureteral orifices in normal anatomic location. Purulent urine  Indications: Patient is a51 year old female with a history ofleft ureteral stricture managed with an indwelling stent.After discussing treatment options, she decided proceed with left stent exchange.  Procedure her in detail: The patient was brought to the operating room and a brief timeout was done to ensure correct patient, correct procedure, correct site. General anesthesia was administered patient was placed in dorsal lithotomy position. Her genitalia was then prepped and draped in usual sterile fashion. A rigid 27 French cystoscope was passed in the urethra and the bladder. Bladder was inspected free masses or lesions. the ureteral orifices were in the normal orthotopic locations.  We then used a grasper to bring the stent to the urethral meatus. a 6 french ureteral catheter was then instilled into the left ureteral orifice. a gentle retrograde was obtained and findings noted above. we then placed a zip wire through the ureteral catheter and advanced up to the renal pelvis. Wethenremovedthe stent.Once all stone fragments were removed we then placed a6x 24double-j ureteral stent over the original zip wire. We then removed the wireand good coil was noted in the the renal pelvis under fluoroscopy and the bladder under direct vision. the bladder was then drained and  this concluded the procedure which was well tolerated by patient.  Complications: None  Condition: Stable, extubated, transferred to PACU  Plan: Patient is to be discharged home as to follow-up in1 month for a renal US

## 2018-10-02 NOTE — Anesthesia Preprocedure Evaluation (Addendum)
Anesthesia Evaluation  Patient identified by MRN, date of birth, ID bandGeneral Assessment Comment:Pt sleep.   Reviewed: Allergy & Precautions, NPO status , Patient's Chart, lab work & pertinent test results  Airway Mallampati: I   Neck ROM: Full    Dental  (+) Edentulous Upper, Edentulous Lower   Pulmonary COPD,  COPD inhaler, former smoker,    breath sounds clear to auscultation       Cardiovascular hypertension, + CAD   Rhythm:Regular Rate:Normal     Neuro/Psych  Headaches, Dementia    GI/Hepatic Neg liver ROS, GERD  ,  Endo/Other  Hypothyroidism   Renal/GU Renal InsufficiencyRenal disease     Musculoskeletal  (+) Arthritis ,   Abdominal Normal abdominal exam  (+)   Peds  Hematology   Anesthesia Other Findings   Reproductive/Obstetrics                            Lab Results  Component Value Date   WBC 12.0 (H) 10/02/2018   HGB 12.1 10/02/2018   HCT 39.4 10/02/2018   MCV 97.5 10/02/2018   PLT 259 10/02/2018   Lab Results  Component Value Date   CREATININE 0.65 10/02/2018   BUN 27 (H) 10/02/2018   NA 141 10/02/2018   K 4.0 10/02/2018   CL 104 10/02/2018   CO2 26 10/02/2018   Lab Results  Component Value Date   INR 0.95 04/01/2010   INR 0.90 03/22/2010   INR 0.97 02/07/2010   EKG: normal sinus rhythm, PVC's.  Echo: - Left ventricle: The cavity size was normal. Wall thickness was   increased in a pattern of moderate LVH. There was focal basal   hypertrophy of the septum. Systolic function was normal. The   estimated ejection fraction was in the range of 55% to 60%. Wall   motion was normal; there were no regional wall motion   abnormalities. The tissue Doppler parameters were abnormal.   Findings consistent with left ventricular diastolic dysfunction,   indeterminate for quantification of dysfunction grade. - Ventricular septum: Septal motion showed &quot;bounce&quot;. -  Aortic valve: There was no regurgitation. - Mitral valve: There was trivial regurgitation. - Right ventricle: Systolic function was normal. - Atrial septum: No defect or patent foramen ovale was identified. - Tricuspid valve: There was mild regurgitation. - Pulmonic valve: There was no significant regurgitation. - Pulmonary arteries: Systolic pressure was increased. PA peak   pressure: 37 mm Hg (S).   Anesthesia Physical Anesthesia Plan  ASA: III  Anesthesia Plan: MAC   Post-op Pain Management:    Induction: Intravenous  PONV Risk Score and Plan: 3 and Ondansetron, Propofol infusion and Treatment may vary due to age or medical condition  Airway Management Planned: Simple Face Mask  Additional Equipment: None  Intra-op Plan:   Post-operative Plan:   Informed Consent: I have reviewed the patients History and Physical, chart, labs and discussed the procedure including the risks, benefits and alternatives for the proposed anesthesia with the patient or authorized representative who has indicated his/her understanding and acceptance.   Consent reviewed with POA  Plan Discussed with: CRNA  Anesthesia Plan Comments:        Anesthesia Quick Evaluation

## 2018-10-02 NOTE — Discharge Instructions (Signed)

## 2018-10-03 ENCOUNTER — Encounter (HOSPITAL_COMMUNITY): Payer: Self-pay | Admitting: Urology

## 2018-10-04 LAB — URINE CULTURE: Culture: >=100000 — AB

## 2018-10-07 DIAGNOSIS — R627 Adult failure to thrive: Secondary | ICD-10-CM | POA: Diagnosis not present

## 2018-10-07 DIAGNOSIS — N200 Calculus of kidney: Secondary | ICD-10-CM | POA: Diagnosis not present

## 2018-10-07 DIAGNOSIS — M545 Low back pain: Secondary | ICD-10-CM | POA: Diagnosis not present

## 2018-10-07 DIAGNOSIS — F0390 Unspecified dementia without behavioral disturbance: Secondary | ICD-10-CM | POA: Diagnosis not present

## 2018-10-08 DIAGNOSIS — F0390 Unspecified dementia without behavioral disturbance: Secondary | ICD-10-CM | POA: Diagnosis not present

## 2018-10-08 DIAGNOSIS — I10 Essential (primary) hypertension: Secondary | ICD-10-CM | POA: Diagnosis not present

## 2018-10-08 DIAGNOSIS — E860 Dehydration: Secondary | ICD-10-CM | POA: Diagnosis not present

## 2018-10-08 DIAGNOSIS — G8929 Other chronic pain: Secondary | ICD-10-CM | POA: Diagnosis not present

## 2018-10-08 DIAGNOSIS — R627 Adult failure to thrive: Secondary | ICD-10-CM | POA: Diagnosis not present

## 2018-10-08 DIAGNOSIS — R5383 Other fatigue: Secondary | ICD-10-CM | POA: Diagnosis not present

## 2018-10-09 DIAGNOSIS — R627 Adult failure to thrive: Secondary | ICD-10-CM | POA: Diagnosis not present

## 2018-10-09 DIAGNOSIS — R5383 Other fatigue: Secondary | ICD-10-CM | POA: Diagnosis not present

## 2018-10-09 DIAGNOSIS — E860 Dehydration: Secondary | ICD-10-CM | POA: Diagnosis not present

## 2018-10-09 DIAGNOSIS — F0390 Unspecified dementia without behavioral disturbance: Secondary | ICD-10-CM | POA: Diagnosis not present

## 2018-10-10 DIAGNOSIS — E860 Dehydration: Secondary | ICD-10-CM | POA: Diagnosis not present

## 2018-10-10 DIAGNOSIS — R627 Adult failure to thrive: Secondary | ICD-10-CM | POA: Diagnosis not present

## 2018-10-10 DIAGNOSIS — R5383 Other fatigue: Secondary | ICD-10-CM | POA: Diagnosis not present

## 2018-10-10 DIAGNOSIS — F0390 Unspecified dementia without behavioral disturbance: Secondary | ICD-10-CM | POA: Diagnosis not present

## 2018-10-13 DIAGNOSIS — R627 Adult failure to thrive: Secondary | ICD-10-CM | POA: Diagnosis not present

## 2018-10-13 DIAGNOSIS — F0390 Unspecified dementia without behavioral disturbance: Secondary | ICD-10-CM | POA: Diagnosis not present

## 2018-10-13 DIAGNOSIS — R5383 Other fatigue: Secondary | ICD-10-CM | POA: Diagnosis not present

## 2018-10-14 DIAGNOSIS — G8929 Other chronic pain: Secondary | ICD-10-CM | POA: Diagnosis not present

## 2018-10-14 DIAGNOSIS — R627 Adult failure to thrive: Secondary | ICD-10-CM | POA: Diagnosis not present

## 2018-10-14 DIAGNOSIS — F0390 Unspecified dementia without behavioral disturbance: Secondary | ICD-10-CM | POA: Diagnosis not present

## 2018-10-17 DIAGNOSIS — F331 Major depressive disorder, recurrent, moderate: Secondary | ICD-10-CM | POA: Diagnosis not present

## 2018-10-17 DIAGNOSIS — F0280 Dementia in other diseases classified elsewhere without behavioral disturbance: Secondary | ICD-10-CM | POA: Diagnosis not present

## 2018-10-17 DIAGNOSIS — G301 Alzheimer's disease with late onset: Secondary | ICD-10-CM | POA: Diagnosis not present

## 2018-10-22 DIAGNOSIS — G8929 Other chronic pain: Secondary | ICD-10-CM | POA: Diagnosis not present

## 2018-10-22 DIAGNOSIS — R4182 Altered mental status, unspecified: Secondary | ICD-10-CM | POA: Diagnosis not present

## 2018-10-22 DIAGNOSIS — R627 Adult failure to thrive: Secondary | ICD-10-CM | POA: Diagnosis not present

## 2018-10-22 DIAGNOSIS — F0390 Unspecified dementia without behavioral disturbance: Secondary | ICD-10-CM | POA: Diagnosis not present

## 2018-10-27 ENCOUNTER — Non-Acute Institutional Stay: Payer: Medicare Other | Admitting: Primary Care

## 2018-10-27 DIAGNOSIS — Z515 Encounter for palliative care: Secondary | ICD-10-CM

## 2018-10-27 DIAGNOSIS — L89153 Pressure ulcer of sacral region, stage 3: Secondary | ICD-10-CM

## 2018-10-27 DIAGNOSIS — E43 Unspecified severe protein-calorie malnutrition: Secondary | ICD-10-CM

## 2018-10-27 NOTE — Progress Notes (Signed)
PALLIATIVE CARE CONSULT VISIT   PATIENT NAME: Sherry [name] DOB: 07/28/1923 MRN: [id number]  PRIMARY CARE PROVIDER:   Marletta Lor, MD  REFERRING PROVIDER:  Marletta Lor, MD [facility], [place] [address]  Dr. Reesa Chew   RESPONSIBLE PARTY:   Tacey Heap at [phone], daughter   ASSESSMENT and RECOMMENDATIONS:  1.Goals of Care: Patient at [facility] receiving skilled nursing services.  Patient non verbal, does not track with eyes. Staff state she does not eat at this point. States difficulty taking  medications. Staff report a stage 3 decubitus on sacrum.  She has a colostomy intact. PPS 10%, FAST score 7e. Currently noted to be a full code in the medical record. Call to daughter to discuss goals of care. No answer, VM left.    I spent 25 minutes providing this consultation,  From  1400  to 1425 More than 50% of the time in this consultation was spent coordinating communication.   HISTORY OF PRESENT ILLNESS:  Sherry [name] is a 84 y.o. year old female with multiple medical problems including dementia, CAD, COPD, protein calorie malnutrition . Palliative Care was asked to help address goals of care.   Will  Continue to try to reach family for goals of care meeting.  CODE STATUS: FULL  PPS: 10% HOSPICE ELIGIBILITY/DIAGNOSIS:tbd  PAST MEDICAL HISTORY:  Past Medical History:  Diagnosis Date  . AAA (abdominal aortic aneurysm) ([place])   . Anemia    Iron deficiency   . Arthritis   . Bronchitis   . CAD (coronary artery disease)   . COPD (chronic obstructive pulmonary disease) ([place])   . Emphysema of lung ([place])   . Gastroenteritis    colitis  . GERD (gastroesophageal reflux disease)   . Headache(784.0)   . History of blood transfusion   . History of kidney stones   . Hyperlipidemia   . Hypertension   . Hypothyroidism   . Osteoporosis   . Parastomal hernia   . Protein calorie malnutrition ([place])   . Rectal  adenocarcinoma ([place]) dx'd 08/2009  . Rectal cancer ([place])   . Scoliosis    per Dr Melony Overly ortho  . Shingles   . Shortness of breath   . Tendon adhesions    torn tendon right shoulder  . Vascular dementia [facility])     SOCIAL HX:  Social History   Tobacco Use  . Smoking status: Former Smoker    Packs/day: 1.50    Years: 65.00    Pack years: 97.50    Types: Cigarettes    Last attempt to quit: 12/18/2003    Years since quitting: 14.8  . Smokeless tobacco: Never Used  . Tobacco comment: quit 2005  Substance Use Topics  . Alcohol use: No    Alcohol/week: 0.0 standard drinks    ALLERGIES:  Allergies  Allergen Reactions  . Percocet [Oxycodone-Acetaminophen] Nausea And Vomiting  . Influenza Virus Vacc Split Pf Other (See Comments)    Unknown  But had to be hospitalized -1966  . Sulfamethoxazole-Trimethoprim Nausea And Vomiting  . Hibiclens [Chlorhexidine Gluconate] Rash     PERTINENT MEDICATIONS:  Outpatient Encounter Medications as of 10/27/2018  Medication Sig  . acetaminophen (TYLENOL) 325 MG tablet Take 650 mg by mouth every 4 (four) hours as needed for moderate pain.  . Amino Acids-Protein Hydrolys (FEEDING SUPPLEMENT, PRO-STAT SUGAR FREE 64,) LIQD Take 30 mLs by mouth 2 (two) times daily.  Marland Kitchen atorvastatin (LIPITOR) 10 MG tablet TAKE  1 TABLET DAILY (Patient taking differently: Take 10 mg by mouth every evening. )  . collagenase (SANTYL) ointment Apply 1 application topically every evening. Apply to sacrum  . ENSURE PLUS (ENSURE PLUS) LIQD Take 237 mLs by mouth 2 (two) times daily.  Marland Kitchen levothyroxine (SYNTHROID, LEVOTHROID) 100 MCG tablet TAKE 1 TABLET DAILY  . mirtazapine (REMERON SOL-TAB) 15 MG disintegrating tablet Take 15 mg by mouth at bedtime.  . Multiple Vitamins-Minerals (DECUBI-VITE) CAPS Take 1 capsule by mouth daily.  . ondansetron (ZOFRAN) 4 MG tablet Take 1 tablet (4 mg total) by mouth every 8 (eight) hours as needed for nausea or vomiting. (Patient taking  differently: Take 4 mg by mouth every 6 (six) hours as needed for nausea or vomiting. )  . polyethylene glycol (MIRALAX / GLYCOLAX) packet Take 17 g by mouth 2 (two) times daily.  Marland Kitchen PROAIR HFA 108 (90 Base) MCG/ACT inhaler USE 2 INHALATIONS INTO THE LUNGS EVERY 4 HOURS AS NEEDED FOR SHORTNESS OF BREATH (Patient taking differently: Inhale 2 puffs into the lungs every 4 (four) hours as needed for wheezing or shortness of breath. )  . QUEtiapine (SEROQUEL) 25 MG tablet Take 1 tablet (25 mg total) by mouth at bedtime.  . TOPROL XL 25 MG 24 hr tablet TAKE ONE-HALF (1/2) TABLET DAILY (Patient taking differently: Take 12.5 mg by mouth daily. )  . traMADol (ULTRAM) 50 MG tablet Take 50-100 mg by mouth See admin instructions. Take 50 mg by mouth every 12 hours, may also take 50 mg every 8 hours as needed for pain  . Trospium Chloride 60 MG CP24 Take 60 mg by mouth daily.    No facility-administered encounter medications on file as of 10/27/2018.     PHYSICAL EXAM:   VS 98/1-90-20- 130/90  General: NAD, frail appearing, thin Cardiovascular: regular rate and rhythm, S1S2 Pulmonary: clear all fields. Abdomen: soft, nontender, + bowel sounds. Ostomy GU: no suprapubic tenderness Extremities: no edema, no joint deformities Skin: sacral wound reported by staff.  Neurological: Weakness, non verbal, does not track with eyes.  Phill Myron. Sherry [name] AGPCNP-BC

## 2018-10-28 DIAGNOSIS — F0390 Unspecified dementia without behavioral disturbance: Secondary | ICD-10-CM | POA: Diagnosis not present

## 2018-10-28 DIAGNOSIS — G8929 Other chronic pain: Secondary | ICD-10-CM | POA: Diagnosis not present

## 2018-10-28 DIAGNOSIS — R627 Adult failure to thrive: Secondary | ICD-10-CM | POA: Diagnosis not present

## 2018-11-16 DEATH — deceased

## 2019-02-17 IMAGING — DX DG FOOT COMPLETE 3+V*L*
3 series · 3 of 3 positions shown · non-contrast
Comparison: None.

CLINICAL DATA: Fall, foot injury, pain.

EXAM:
LEFT FOOT - COMPLETE 3+ VIEW

[x foot ap left]
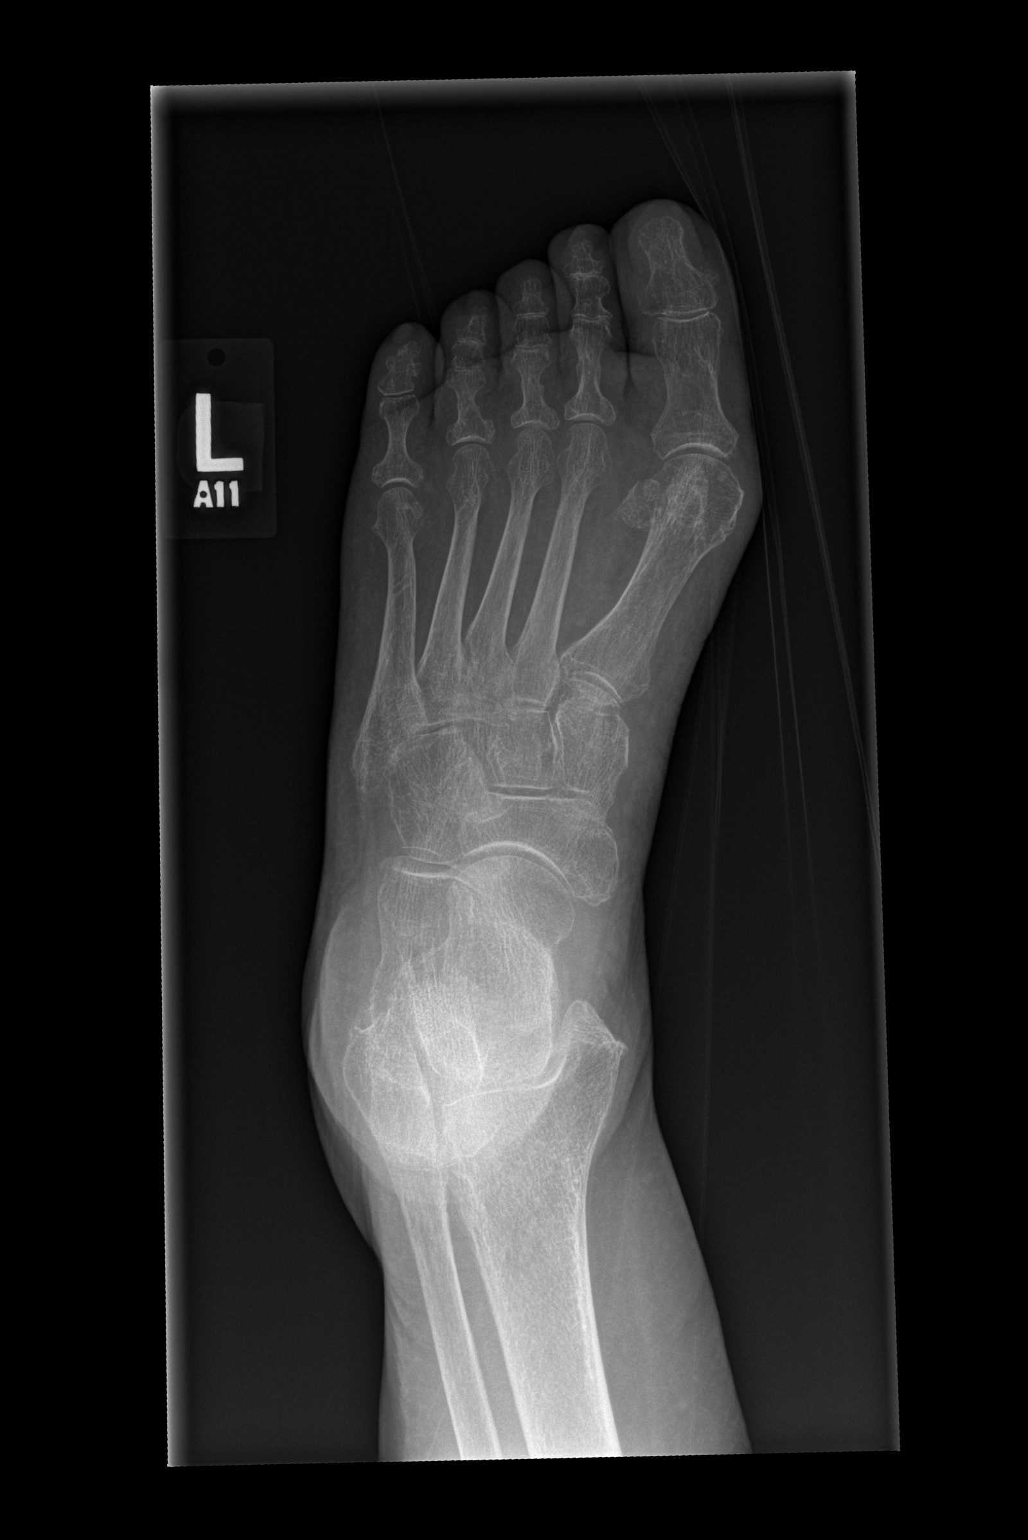

[x foot obl left]
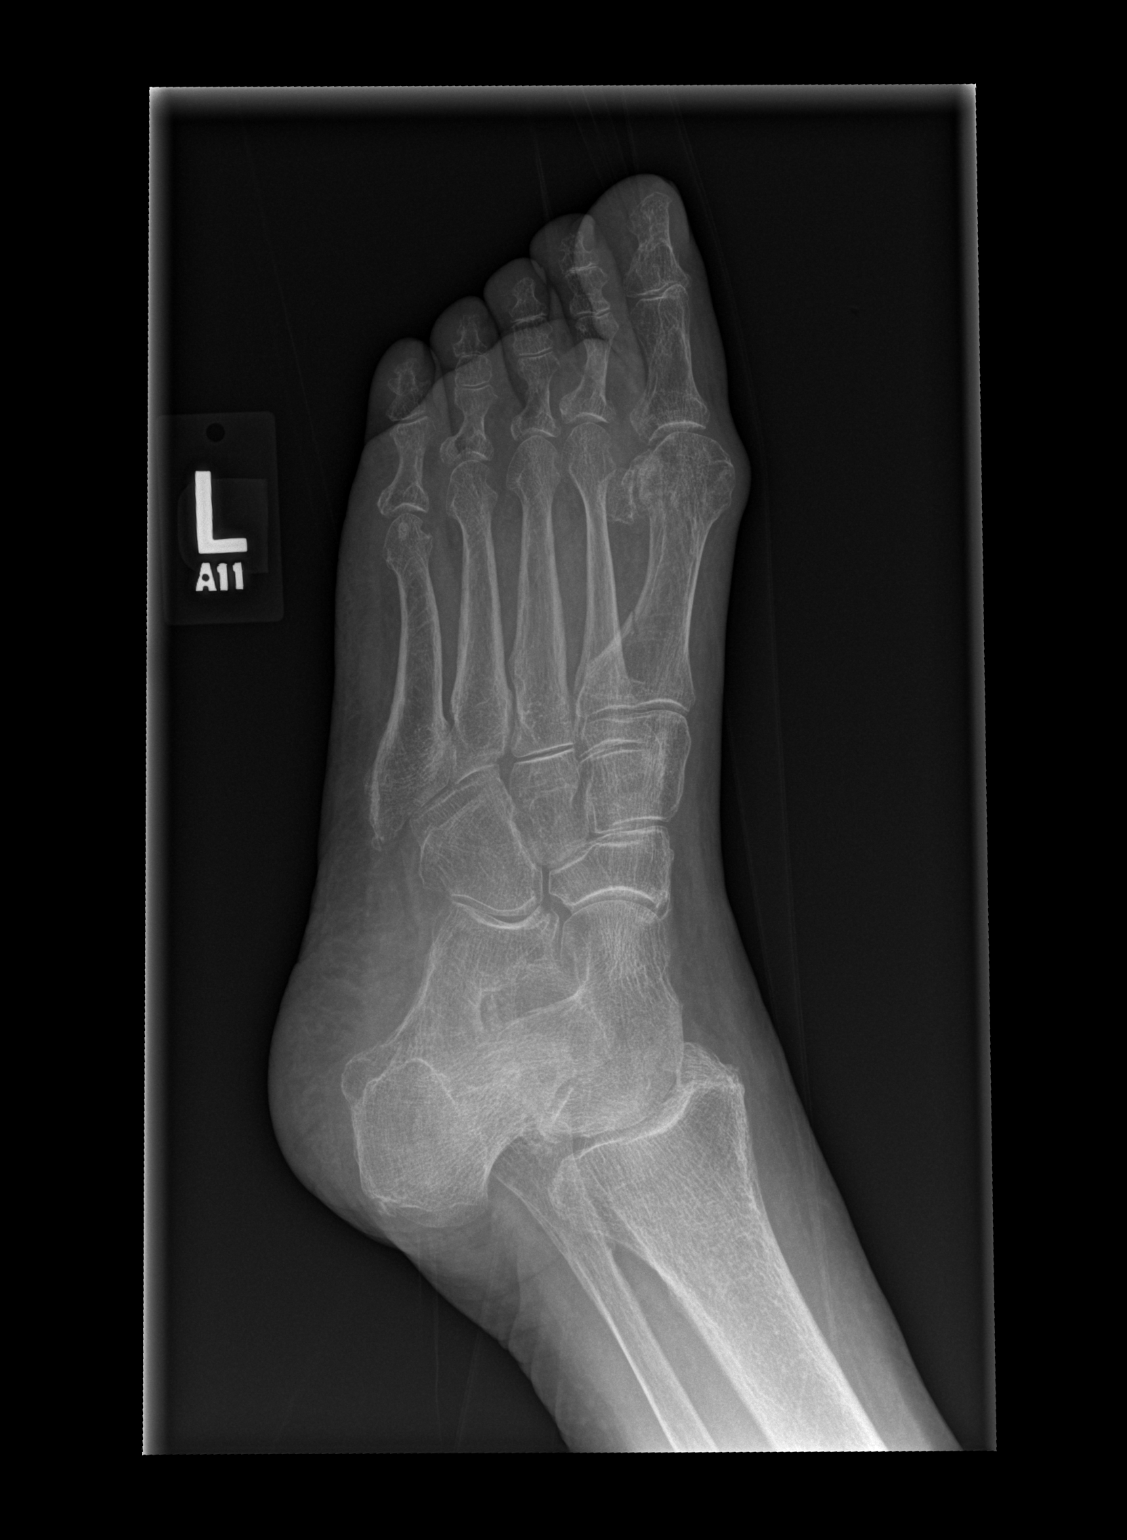

[x foot lat left]
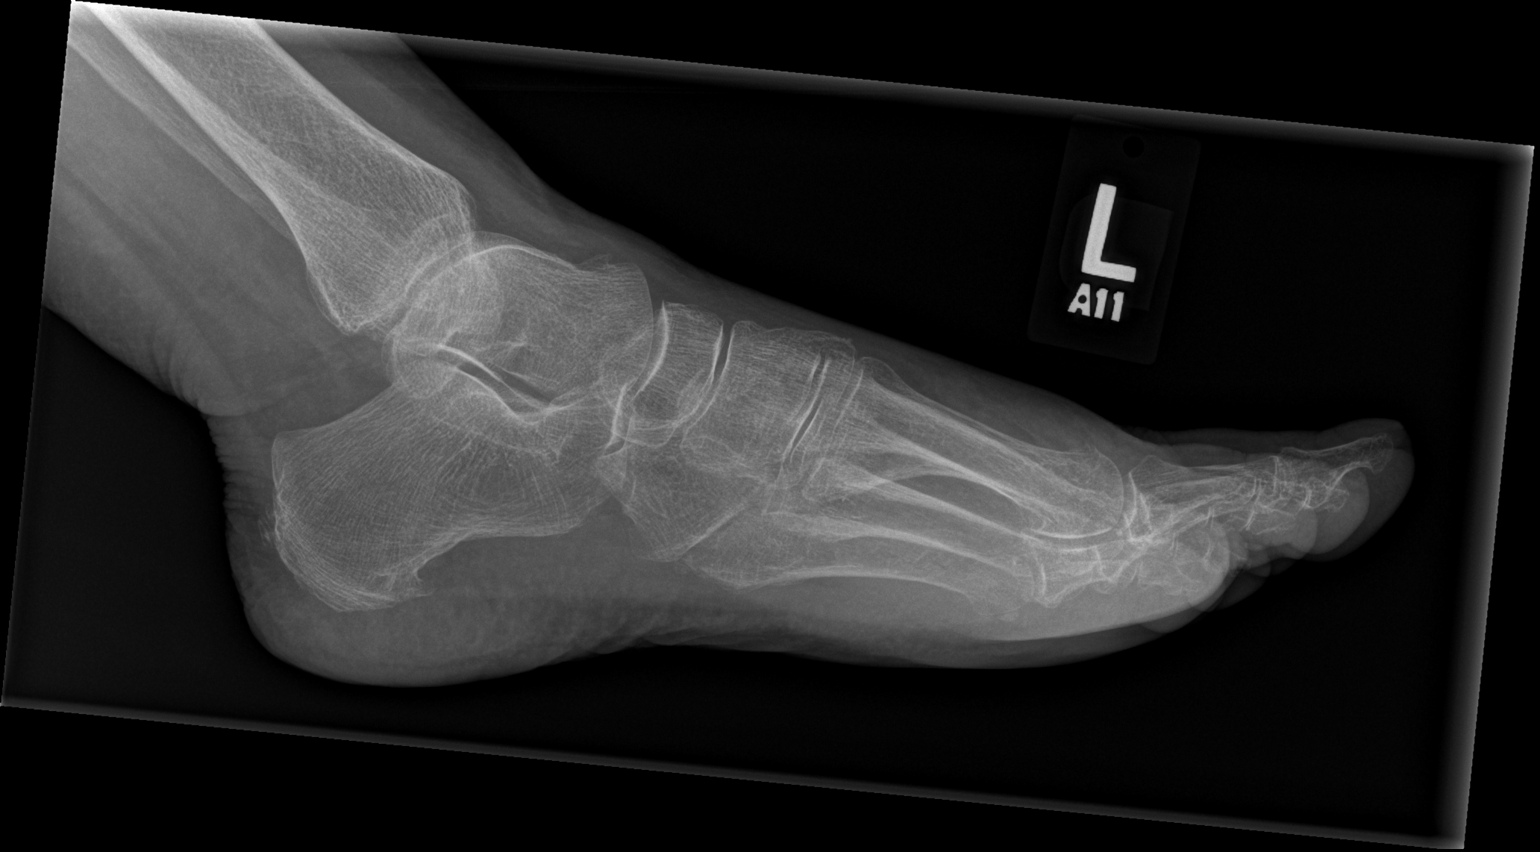

[3 of 3 positions shown; findings below may reference images not displayed]

FINDINGS: Diffuse osteopenia limits characterization of osseous detail,
however, there is no fracture line or displaced fracture fragment
identified. Focal lucency at the cortex of the proximal first
metatarsal bone is felt to be a nutrient vessel foramen rather than
nondisplaced fracture.

Adjacent soft tissues are unremarkable.
IMPRESSION: Osteopenia.  No convincing fracture or osseous displacement.
